# Patient Record
Sex: Female | Born: 1950
Health system: Southern US, Community
[De-identification: ages and names within clinical notes are randomized; demographics above are authoritative.]

## PROBLEM LIST (undated history)

## (undated) ENCOUNTER — Emergency Department (HOSPITAL_COMMUNITY): Admission: EM | Payer: Worker's Compensation | Source: Home / Self Care

## (undated) DIAGNOSIS — K219 Gastro-esophageal reflux disease without esophagitis: Secondary | ICD-10-CM

## (undated) DIAGNOSIS — D72829 Elevated white blood cell count, unspecified: Secondary | ICD-10-CM

## (undated) DIAGNOSIS — I10 Essential (primary) hypertension: Secondary | ICD-10-CM

## (undated) DIAGNOSIS — I447 Left bundle-branch block, unspecified: Secondary | ICD-10-CM

## (undated) DIAGNOSIS — K7581 Nonalcoholic steatohepatitis (NASH): Secondary | ICD-10-CM

## (undated) DIAGNOSIS — D649 Anemia, unspecified: Secondary | ICD-10-CM

## (undated) DIAGNOSIS — M1990 Unspecified osteoarthritis, unspecified site: Secondary | ICD-10-CM

## (undated) DIAGNOSIS — E119 Type 2 diabetes mellitus without complications: Secondary | ICD-10-CM

## (undated) DIAGNOSIS — M722 Plantar fascial fibromatosis: Secondary | ICD-10-CM

## (undated) DIAGNOSIS — Z8489 Family history of other specified conditions: Secondary | ICD-10-CM

## (undated) DIAGNOSIS — N3010 Interstitial cystitis (chronic) without hematuria: Secondary | ICD-10-CM

## (undated) HISTORY — DX: Elevated white blood cell count, unspecified: D72.829

## (undated) HISTORY — PX: SHOULDER SURGERY: SHX246

## (undated) HISTORY — PX: CARPAL TUNNEL RELEASE: SHX101

## (undated) HISTORY — PX: BACK SURGERY: SHX140

## (undated) HISTORY — PX: TENDON REPAIR: SHX5111

---

## 1973-12-25 HISTORY — PX: DILATION AND CURETTAGE OF UTERUS: SHX78

## 1985-12-25 HISTORY — PX: TUBAL LIGATION: SHX77

## 2001-08-19 ENCOUNTER — Ambulatory Visit (HOSPITAL_COMMUNITY): Admission: RE | Admit: 2001-08-19 | Discharge: 2001-08-19 | Payer: Self-pay | Admitting: Orthopedic Surgery

## 2001-08-19 ENCOUNTER — Encounter: Payer: Self-pay | Admitting: Orthopedic Surgery

## 2003-03-25 ENCOUNTER — Other Ambulatory Visit: Admission: RE | Admit: 2003-03-25 | Discharge: 2003-03-25 | Payer: Self-pay | Admitting: Obstetrics & Gynecology

## 2004-04-26 ENCOUNTER — Other Ambulatory Visit: Admission: RE | Admit: 2004-04-26 | Discharge: 2004-04-26 | Payer: Self-pay | Admitting: Obstetrics & Gynecology

## 2004-10-19 ENCOUNTER — Ambulatory Visit (HOSPITAL_COMMUNITY): Admission: RE | Admit: 2004-10-19 | Discharge: 2004-10-19 | Payer: Self-pay | Admitting: Family Medicine

## 2005-06-28 ENCOUNTER — Other Ambulatory Visit: Admission: RE | Admit: 2005-06-28 | Discharge: 2005-06-28 | Payer: Self-pay | Admitting: Obstetrics & Gynecology

## 2005-07-02 ENCOUNTER — Emergency Department (HOSPITAL_COMMUNITY): Admission: EM | Admit: 2005-07-02 | Discharge: 2005-07-02 | Payer: Self-pay | Admitting: Emergency Medicine

## 2006-01-31 ENCOUNTER — Emergency Department (HOSPITAL_COMMUNITY): Admission: EM | Admit: 2006-01-31 | Discharge: 2006-01-31 | Payer: Self-pay | Admitting: Emergency Medicine

## 2009-09-09 ENCOUNTER — Encounter: Admission: RE | Admit: 2009-09-09 | Discharge: 2009-09-09 | Payer: Self-pay | Admitting: Family Medicine

## 2009-09-21 ENCOUNTER — Encounter: Admission: RE | Admit: 2009-09-21 | Discharge: 2009-09-21 | Payer: Self-pay | Admitting: Family Medicine

## 2009-09-25 ENCOUNTER — Encounter: Admission: RE | Admit: 2009-09-25 | Discharge: 2009-09-25 | Payer: Self-pay | Admitting: Family Medicine

## 2010-01-14 ENCOUNTER — Ambulatory Visit (HOSPITAL_COMMUNITY): Admission: RE | Admit: 2010-01-14 | Discharge: 2010-01-14 | Payer: Self-pay | Admitting: Neurological Surgery

## 2010-01-19 ENCOUNTER — Ambulatory Visit: Payer: Self-pay | Admitting: Oncology

## 2010-01-20 ENCOUNTER — Other Ambulatory Visit: Admission: RE | Admit: 2010-01-20 | Discharge: 2010-01-20 | Payer: Self-pay | Admitting: Oncology

## 2010-01-20 LAB — MORPHOLOGY: PLT EST: ADEQUATE

## 2010-01-20 LAB — CBC WITH DIFFERENTIAL/PLATELET
Basophils Absolute: 0.1 10*3/uL (ref 0.0–0.1)
HCT: 41.8 % (ref 34.8–46.6)
HGB: 14.1 g/dL (ref 11.6–15.9)
MCH: 29.2 pg (ref 25.1–34.0)
MONO#: 1 10*3/uL — ABNORMAL HIGH (ref 0.1–0.9)
NEUT%: 67.2 % (ref 38.4–76.8)
WBC: 13.4 10*3/uL — ABNORMAL HIGH (ref 3.9–10.3)
lymph#: 3.1 10*3/uL (ref 0.9–3.3)

## 2010-01-20 LAB — COMPREHENSIVE METABOLIC PANEL
ALT: 25 U/L (ref 0–35)
AST: 26 U/L (ref 0–37)
Albumin: 3.9 g/dL (ref 3.5–5.2)
Alkaline Phosphatase: 84 U/L (ref 39–117)
Potassium: 3.6 mEq/L (ref 3.5–5.3)
Sodium: 140 mEq/L (ref 135–145)
Total Protein: 7.7 g/dL (ref 6.0–8.3)

## 2010-01-20 LAB — CHCC SMEAR

## 2010-01-25 LAB — FLOW CYTOMETRY

## 2010-04-11 ENCOUNTER — Ambulatory Visit: Payer: Self-pay | Admitting: Oncology

## 2010-04-13 LAB — CBC WITH DIFFERENTIAL/PLATELET
EOS%: 2.9 % (ref 0.0–7.0)
MCH: 28.8 pg (ref 25.1–34.0)
MCHC: 32.6 g/dL (ref 31.5–36.0)
MCV: 88.1 fL (ref 79.5–101.0)
MONO%: 8.1 % (ref 0.0–14.0)
NEUT#: 5.9 10*3/uL (ref 1.5–6.5)
RBC: 4.97 10*6/uL (ref 3.70–5.45)
RDW: 13.7 % (ref 11.2–14.5)

## 2010-07-19 ENCOUNTER — Ambulatory Visit: Payer: Self-pay | Admitting: Oncology

## 2010-07-22 LAB — CBC WITH DIFFERENTIAL/PLATELET
BASO%: 1.4 % (ref 0.0–2.0)
HCT: 41.5 % (ref 34.8–46.6)
LYMPH%: 26.6 % (ref 14.0–49.7)
MCH: 29.7 pg (ref 25.1–34.0)
MCHC: 34.1 g/dL (ref 31.5–36.0)
MONO#: 0.6 10*3/uL (ref 0.1–0.9)
NEUT%: 64.7 % (ref 38.4–76.8)
Platelets: 317 10*3/uL (ref 145–400)
WBC: 11 10*3/uL — ABNORMAL HIGH (ref 3.9–10.3)

## 2010-07-22 LAB — COMPREHENSIVE METABOLIC PANEL
ALT: 31 U/L (ref 0–35)
AST: 31 U/L (ref 0–37)
Albumin: 4.5 g/dL (ref 3.5–5.2)
BUN: 13 mg/dL (ref 6–23)
Calcium: 9.7 mg/dL (ref 8.4–10.5)
Chloride: 101 mEq/L (ref 96–112)
Potassium: 3.9 mEq/L (ref 3.5–5.3)
Total Protein: 7.6 g/dL (ref 6.0–8.3)

## 2010-07-22 LAB — MORPHOLOGY

## 2010-12-25 HISTORY — PX: SHOULDER SURGERY: SHX246

## 2011-03-13 LAB — DIFFERENTIAL
Basophils Absolute: 0.1 10*3/uL (ref 0.0–0.1)
Lymphocytes Relative: 26 % (ref 12–46)
Monocytes Absolute: 1.1 10*3/uL — ABNORMAL HIGH (ref 0.1–1.0)
Monocytes Relative: 8 % (ref 3–12)
Neutro Abs: 9 10*3/uL — ABNORMAL HIGH (ref 1.7–7.7)
Neutrophils Relative %: 64 % (ref 43–77)

## 2011-03-13 LAB — BASIC METABOLIC PANEL WITH GFR
BUN: 14 mg/dL (ref 6–23)
CO2: 28 meq/L (ref 19–32)
Calcium: 10 mg/dL (ref 8.4–10.5)
Chloride: 100 meq/L (ref 96–112)
Creatinine, Ser: 0.64 mg/dL (ref 0.4–1.2)
GFR calc Af Amer: >60 mL/min (ref >60)
GFR calc non Af Amer: >60 mL/min (ref >60)
Glucose, Bld: 121 mg/dL — ABNORMAL HIGH (ref 70–99)
Potassium: 4.3 meq/L (ref 3.5–5.1)
Sodium: 138 meq/L (ref 135–145)

## 2011-03-13 LAB — PROTIME-INR
INR: 1.01 (ref 0.00–1.49)
Prothrombin Time: 13.2 s (ref 11.6–15.2)

## 2011-03-13 LAB — CBC
Hemoglobin: 14.2 g/dL (ref 12.0–15.0)
RBC: 4.83 MIL/uL (ref 3.87–5.11)
RDW: 13.3 % (ref 11.5–15.5)

## 2011-03-13 LAB — APTT: aPTT: 29 seconds (ref 24–37)

## 2011-05-11 ENCOUNTER — Emergency Department (HOSPITAL_COMMUNITY)
Admission: EM | Admit: 2011-05-11 | Discharge: 2011-05-11 | Disposition: A | Discharge status: Home / Self Care | Payer: Worker's Compensation | Attending: Emergency Medicine | Admitting: Emergency Medicine

## 2011-05-11 ENCOUNTER — Emergency Department (HOSPITAL_COMMUNITY): Payer: Worker's Compensation

## 2011-05-11 DIAGNOSIS — IMO0002 Reserved for concepts with insufficient information to code with codable children: Secondary | ICD-10-CM | POA: Insufficient documentation

## 2011-05-11 DIAGNOSIS — W108XXA Fall (on) (from) other stairs and steps, initial encounter: Secondary | ICD-10-CM | POA: Insufficient documentation

## 2011-05-11 DIAGNOSIS — I10 Essential (primary) hypertension: Secondary | ICD-10-CM | POA: Insufficient documentation

## 2011-05-11 DIAGNOSIS — Z23 Encounter for immunization: Secondary | ICD-10-CM | POA: Insufficient documentation

## 2011-05-11 DIAGNOSIS — Y990 Civilian activity done for income or pay: Secondary | ICD-10-CM | POA: Insufficient documentation

## 2011-05-11 DIAGNOSIS — Y9229 Other specified public building as the place of occurrence of the external cause: Secondary | ICD-10-CM | POA: Insufficient documentation

## 2011-10-27 ENCOUNTER — Other Ambulatory Visit: Payer: Self-pay | Admitting: Oncology

## 2011-10-27 ENCOUNTER — Encounter (HOSPITAL_BASED_OUTPATIENT_CLINIC_OR_DEPARTMENT_OTHER): Payer: Managed Care, Other (non HMO) | Admitting: Oncology

## 2011-10-27 DIAGNOSIS — D72829 Elevated white blood cell count, unspecified: Secondary | ICD-10-CM

## 2011-10-27 LAB — CBC WITH DIFFERENTIAL/PLATELET
HCT: 41.2 % (ref 34.8–46.6)
LYMPH%: 23.1 % (ref 14.0–49.7)
MCH: 29.1 pg (ref 25.1–34.0)
MCHC: 33.5 g/dL (ref 31.5–36.0)
MONO#: 1.1 10*3/uL — ABNORMAL HIGH (ref 0.1–0.9)
MONO%: 6.5 % (ref 0.0–14.0)
NEUT%: 67.5 % (ref 38.4–76.8)
RDW: 13.5 % (ref 11.2–14.5)
WBC: 16.5 10*3/uL — ABNORMAL HIGH (ref 3.9–10.3)
lymph#: 3.8 10*3/uL — ABNORMAL HIGH (ref 0.9–3.3)

## 2011-10-27 LAB — MORPHOLOGY

## 2011-11-01 ENCOUNTER — Telehealth: Payer: Self-pay | Admitting: Oncology

## 2011-11-01 NOTE — Telephone Encounter (Signed)
Faxed over the pof along with the biopsy review form to rad scheduling for the pt's appt

## 2011-11-02 ENCOUNTER — Other Ambulatory Visit: Payer: Self-pay | Admitting: Oncology

## 2011-11-02 DIAGNOSIS — D72829 Elevated white blood cell count, unspecified: Secondary | ICD-10-CM

## 2011-11-13 ENCOUNTER — Encounter (HOSPITAL_COMMUNITY): Payer: Self-pay

## 2011-11-13 ENCOUNTER — Encounter (HOSPITAL_COMMUNITY): Payer: Self-pay | Admitting: *Deleted

## 2011-11-14 ENCOUNTER — Other Ambulatory Visit (HOSPITAL_COMMUNITY): Payer: Self-pay | Admitting: *Deleted

## 2011-11-14 ENCOUNTER — Encounter (HOSPITAL_COMMUNITY): Payer: Self-pay | Admitting: Pharmacy Technician

## 2011-11-14 ENCOUNTER — Other Ambulatory Visit: Payer: Self-pay | Admitting: Radiology

## 2011-11-14 MED ORDER — SODIUM CHLORIDE 0.9 % IV SOLN: INTRAVENOUS | Status: DC

## 2011-11-14 MED ORDER — VANCOMYCIN HCL 1000 MG IV SOLR: 1000.0000 mg | Freq: Once | INTRAVENOUS | Status: DC

## 2011-11-15 ENCOUNTER — Encounter (HOSPITAL_COMMUNITY)
Admission: RE | Admit: 2011-11-15 | Discharge: 2011-11-15 | Disposition: A | Discharge status: Home / Self Care | Payer: Worker's Compensation | Source: Ambulatory Visit | Attending: Oncology | Admitting: Oncology

## 2011-11-15 ENCOUNTER — Other Ambulatory Visit (HOSPITAL_COMMUNITY): Payer: Self-pay | Admitting: *Deleted

## 2011-11-15 ENCOUNTER — Ambulatory Visit (HOSPITAL_COMMUNITY)
Admission: RE | Admit: 2011-11-15 | Discharge: 2011-11-15 | Disposition: A | Discharge status: Home / Self Care | Payer: Managed Care, Other (non HMO) | Source: Ambulatory Visit | Attending: Oncology | Admitting: Oncology

## 2011-11-15 ENCOUNTER — Encounter (HOSPITAL_COMMUNITY): Payer: Self-pay

## 2011-11-15 ENCOUNTER — Other Ambulatory Visit: Payer: Self-pay | Admitting: Interventional Radiology

## 2011-11-15 DIAGNOSIS — D72829 Elevated white blood cell count, unspecified: Secondary | ICD-10-CM | POA: Insufficient documentation

## 2011-11-15 DIAGNOSIS — K219 Gastro-esophageal reflux disease without esophagitis: Secondary | ICD-10-CM | POA: Insufficient documentation

## 2011-11-15 DIAGNOSIS — I10 Essential (primary) hypertension: Secondary | ICD-10-CM | POA: Insufficient documentation

## 2011-11-15 DIAGNOSIS — Z7982 Long term (current) use of aspirin: Secondary | ICD-10-CM | POA: Insufficient documentation

## 2011-11-15 DIAGNOSIS — Z79899 Other long term (current) drug therapy: Secondary | ICD-10-CM | POA: Insufficient documentation

## 2011-11-15 HISTORY — DX: Essential (primary) hypertension: I10

## 2011-11-15 HISTORY — DX: Gastro-esophageal reflux disease without esophagitis: K21.9

## 2011-11-15 HISTORY — DX: Unspecified osteoarthritis, unspecified site: M19.90

## 2011-11-15 HISTORY — DX: Plantar fascial fibromatosis: M72.2

## 2011-11-15 HISTORY — DX: Interstitial cystitis (chronic) without hematuria: N30.10

## 2011-11-15 LAB — CBC
HCT: 39.5 % (ref 36.0–46.0)
MCH: 28.9 pg (ref 26.0–34.0)
MCV: 86.4 fL (ref 78.0–100.0)
RDW: 13.3 % (ref 11.5–15.5)
WBC: 13.4 10*3/uL — ABNORMAL HIGH (ref 4.0–10.5)

## 2011-11-15 MED ORDER — FENTANYL CITRATE 0.05 MG/ML IJ SOLN
INTRAMUSCULAR | Status: AC | PRN
  Administered 2011-11-15 (×2): 50 ug via INTRAVENOUS

## 2011-11-15 MED ORDER — MIDAZOLAM HCL 5 MG/5ML IJ SOLN
INTRAMUSCULAR | Status: AC | PRN
  Administered 2011-11-15 (×2): 1 mg via INTRAVENOUS

## 2011-11-15 NOTE — ED Notes (Signed)
Incision made

## 2011-11-15 NOTE — Procedures (Signed)
Successful R iliac BM asp/Bx  No comp

## 2011-11-15 NOTE — H&P (Signed)
Diane Mayo is an 60 y.o. female.   Chief Complaint: leukocytosis HPI: Bone Marrow Biopsy scheduled for today  Past Medical History  Diagnosis Date  . Leukocytosis   . Hypertension   . Osteoarthritis   . Genital herpes   . GERD (gastroesophageal reflux disease)   . Plantar fasciitis   . Interstitial cystitis     Past Surgical History  Procedure Date  . Carpal tunnel release     right  . Cesarean section   . Tendon repair     site not given    History reviewed. No pertinent family history. Social History:  reports that she has never smoked. She does not have any smokeless tobacco history on file. She reports that she does not drink alcohol or use illicit drugs.  Allergies:  Allergies  Allergen Reactions  . Miconazole     Per dr Gaylyn Rong record/ reaction unknown/ check c pt  . Penicillins     Taken from dr ha record/  Reaction unknown/check with patient    Medications Prior to Admission  Medication Sig Dispense Refill  . losartan-hydrochlorothiazide (HYZAAR) 100-25 MG per tablet Take 1 tablet by mouth every morning.        Marland Kitchen omeprazole (PRILOSEC) 20 MG capsule Take 20 mg by mouth daily.       Marland Kitchen aspirin 325 MG EC tablet Take 325 mg by mouth every 4 (four) hours as needed. PAIN        . Calcium Carbonate-Vitamin D (CALCIUM + D PO) Take 1 tablet by mouth daily.        . naproxen sodium (ANAPROX) 220 MG tablet Take 440 mg by mouth 2 (two) times daily as needed. PAIN        . pentosan polysulfate (ELMIRON) 100 MG capsule Take 200 mg by mouth 2 (two) times daily.         Medications Prior to Admission  Medication Dose Route Frequency Provider Last Rate Last Dose  . DISCONTD: 0.9 %  sodium chloride infusion   Intravenous Continuous Robet Leu, PA      . DISCONTD: vancomycin (VANCOCIN) 1,000 mg in sodium chloride 0.9 % 500 mL IVPB  1,000 mg Intravenous Once Robet Leu, PA        No results found for this or any previous visit (from the past 48 hour(s)). No results  found.  Review of Systems  Constitutional: Negative for fever.  Eyes: Negative for blurred vision and double vision.  Respiratory: Negative for cough.   Cardiovascular: Negative for chest pain.  Gastrointestinal: Negative for nausea, vomiting, abdominal pain and diarrhea.  Neurological: Negative for dizziness and headaches.    Blood pressure 144/62, pulse 74, temperature 98 F (36.7 C), temperature source Oral, resp. rate 16, height 5\' 1"  (1.549 m), weight 250 lb (113.399 kg), SpO2 98.00%. Physical Exam  Constitutional: She is oriented to person, place, and time. She appears well-developed and well-nourished.  HENT:  Head: Normocephalic and atraumatic.  Eyes: EOM are normal.  Neck: Normal range of motion.  Cardiovascular: Normal rate, regular rhythm and normal heart sounds.   No murmur heard. Respiratory: Effort normal and breath sounds normal. She has no wheezes.  GI: Soft. Bowel sounds are normal. She exhibits no mass. There is no tenderness.  Musculoskeletal: Normal range of motion.  Neurological: She is alert and oriented to person, place, and time.  Skin: Skin is warm and dry.     Assessment/Plan Pt scheduled for bone marrow biopsy today in  IR. Understands procedure benefits and risks and agreeable to proceed. Consent signed.   Lesslie Mckeehan A 11/15/2011, 8:07 AM

## 2012-08-07 DIAGNOSIS — N3010 Interstitial cystitis (chronic) without hematuria: Secondary | ICD-10-CM | POA: Insufficient documentation

## 2012-12-12 ENCOUNTER — Telehealth: Payer: Self-pay | Admitting: *Deleted

## 2012-12-12 NOTE — Telephone Encounter (Signed)
Pt left VM has some information for Dr. Gaylyn Rong regarding her father's medical history.

## 2012-12-13 NOTE — Telephone Encounter (Signed)
Pt states saw Dr. Gaylyn Rong about 2 yrs ago with elevated Hgb.  Says they could not find cause of this even after a Bone Marrow biopsy.  Recently found out her father is a carrier for Hemochromatosis. Wanted to know if this could be the cause of her elevated hemoglobin?  States she sees Dr. Renato Gails for her PCP.   Instructed pt to notify Dr. Renato Gails and I will let Dr. Gaylyn Rong know.  Will call her back if he has anything to tell her.  She verbalized understanding.

## 2013-01-16 ENCOUNTER — Other Ambulatory Visit: Payer: Self-pay | Admitting: Dermatology

## 2013-06-02 ENCOUNTER — Other Ambulatory Visit: Payer: Self-pay | Admitting: Obstetrics & Gynecology

## 2013-08-13 DIAGNOSIS — R350 Frequency of micturition: Secondary | ICD-10-CM | POA: Insufficient documentation

## 2014-03-06 ENCOUNTER — Other Ambulatory Visit: Payer: Self-pay | Admitting: Dermatology

## 2014-11-11 DIAGNOSIS — R300 Dysuria: Secondary | ICD-10-CM | POA: Insufficient documentation

## 2014-12-10 DIAGNOSIS — N3001 Acute cystitis with hematuria: Secondary | ICD-10-CM | POA: Insufficient documentation

## 2015-08-17 ENCOUNTER — Other Ambulatory Visit: Payer: Self-pay | Admitting: Obstetrics & Gynecology

## 2015-08-18 LAB — CYTOLOGY - PAP

## 2017-11-01 DIAGNOSIS — N3010 Interstitial cystitis (chronic) without hematuria: Secondary | ICD-10-CM | POA: Diagnosis not present

## 2017-12-07 DIAGNOSIS — E2839 Other primary ovarian failure: Secondary | ICD-10-CM | POA: Diagnosis not present

## 2017-12-07 DIAGNOSIS — R9431 Abnormal electrocardiogram [ECG] [EKG]: Secondary | ICD-10-CM | POA: Diagnosis not present

## 2017-12-07 DIAGNOSIS — M545 Low back pain: Secondary | ICD-10-CM | POA: Diagnosis not present

## 2017-12-07 DIAGNOSIS — I10 Essential (primary) hypertension: Secondary | ICD-10-CM | POA: Diagnosis not present

## 2017-12-07 DIAGNOSIS — K219 Gastro-esophageal reflux disease without esophagitis: Secondary | ICD-10-CM | POA: Diagnosis not present

## 2017-12-07 DIAGNOSIS — E119 Type 2 diabetes mellitus without complications: Secondary | ICD-10-CM | POA: Diagnosis not present

## 2017-12-07 DIAGNOSIS — Z0000 Encounter for general adult medical examination without abnormal findings: Secondary | ICD-10-CM | POA: Diagnosis not present

## 2017-12-07 DIAGNOSIS — E782 Mixed hyperlipidemia: Secondary | ICD-10-CM | POA: Diagnosis not present

## 2017-12-07 DIAGNOSIS — Z23 Encounter for immunization: Secondary | ICD-10-CM | POA: Diagnosis not present

## 2017-12-07 DIAGNOSIS — Z1159 Encounter for screening for other viral diseases: Secondary | ICD-10-CM | POA: Diagnosis not present

## 2017-12-07 DIAGNOSIS — N3010 Interstitial cystitis (chronic) without hematuria: Secondary | ICD-10-CM | POA: Diagnosis not present

## 2017-12-11 ENCOUNTER — Ambulatory Visit
Admission: RE | Admit: 2017-12-11 | Discharge: 2017-12-11 | Disposition: A | Discharge status: Home / Self Care | Payer: Managed Care, Other (non HMO) | Source: Ambulatory Visit | Attending: Family Medicine | Admitting: Family Medicine

## 2017-12-11 ENCOUNTER — Other Ambulatory Visit: Payer: Self-pay | Admitting: Family Medicine

## 2017-12-11 DIAGNOSIS — M48061 Spinal stenosis, lumbar region without neurogenic claudication: Secondary | ICD-10-CM | POA: Diagnosis not present

## 2017-12-11 DIAGNOSIS — M5136 Other intervertebral disc degeneration, lumbar region: Secondary | ICD-10-CM | POA: Diagnosis not present

## 2017-12-11 DIAGNOSIS — R52 Pain, unspecified: Secondary | ICD-10-CM

## 2017-12-20 ENCOUNTER — Encounter: Payer: Self-pay | Admitting: *Deleted

## 2018-01-10 ENCOUNTER — Encounter (INDEPENDENT_AMBULATORY_CARE_PROVIDER_SITE_OTHER): Payer: Self-pay

## 2018-01-10 ENCOUNTER — Ambulatory Visit (INDEPENDENT_AMBULATORY_CARE_PROVIDER_SITE_OTHER): Payer: Medicare Other | Admitting: Interventional Cardiology

## 2018-01-10 ENCOUNTER — Encounter: Payer: Self-pay | Admitting: Interventional Cardiology

## 2018-01-10 VITALS — BP 142/86 | HR 67 | Ht 61.0 in | Wt 231.0 lb

## 2018-01-10 DIAGNOSIS — R9431 Abnormal electrocardiogram [ECG] [EKG]: Secondary | ICD-10-CM | POA: Diagnosis not present

## 2018-01-10 DIAGNOSIS — I10 Essential (primary) hypertension: Secondary | ICD-10-CM

## 2018-01-10 DIAGNOSIS — I447 Left bundle-branch block, unspecified: Secondary | ICD-10-CM | POA: Diagnosis not present

## 2018-01-10 NOTE — Progress Notes (Signed)
Cardiology Office Note   Date:  01/10/2018   ID:  Diane Mayo, DOB 08-Jul-1951, MRN [id number]  PCP:  Maury Dus, MD    No chief complaint on file. HTN, abnormal ECG   Wt Readings from Last 3 Encounters:  01/10/18 231 lb (104.8 kg)  11/15/11 250 lb (113.4 kg)       History of Present Illness: Diane Mayo is a 67 y.o. female who is being seen today for the evaluation of HTN at the request of Lois Huxley, [place].  She has had difficult to control BP.  She gets readings at home in the 140/85 range.    She does not exercise because of her back and leg pain.  She has not tried a recumbent bike or water aerobics.  Her father had CAD.  He had stents placed at age 26.    Denies : Chest pain. Dizziness.  Nitroglycerin use. Orthopnea. Palpitations. Paroxysmal nocturnal dyspnea. Shortness of breath. Syncope.     Past Medical History:  Diagnosis Date  . Genital herpes   . GERD (gastroesophageal reflux disease)   . Hypertension   . Interstitial cystitis   . Leukocytosis   . Osteoarthritis   . Plantar fasciitis     Past Surgical History:  Procedure Laterality Date  . CARPAL TUNNEL RELEASE Bilateral   . CESAREAN SECTION     twice  . SHOULDER SURGERY    . TENDON REPAIR     site not given  . TUBAL LIGATION  1987     Current Outpatient Medications  Medication Sig Dispense Refill  . amLODipine (NORVASC) 10 MG tablet Take 10 mg by mouth daily.    Marland Kitchen aspirin EC 81 MG tablet Take 81 mg by mouth daily.    . Calcium Carbonate-Vitamin D (CALCIUM + D PO) Take 1 tablet by mouth daily.      . cetirizine (ZYRTEC) 10 MG tablet Take 10 mg by mouth daily.    . meloxicam (MOBIC) 7.5 MG tablet Take 7.5 mg by mouth daily.    . metFORMIN (GLUCOPHAGE) 1000 MG tablet Take 1,000 mg by mouth 2 (two) times daily with a meal.    . Metoprolol Tartrate 75 MG TABS Take 1 tablet by mouth 2 (two) times daily.    . naproxen sodium (ANAPROX) 220 MG tablet Take 440 mg by mouth 2 (two) times  daily as needed. PAIN      . olmesartan (BENICAR) 40 MG tablet Take 40 mg by mouth daily.    Marland Kitchen omeprazole (PRILOSEC) 40 MG capsule Take 40 mg by mouth daily.     . pentosan polysulfate (ELMIRON) 100 MG capsule Take 200 mg by mouth 2 (two) times daily.      . phenazopyridine (PYRIDIUM) 95 MG tablet Take 95 mg by mouth as needed for pain.    . pravastatin (PRAVACHOL) 40 MG tablet Take 40 mg by mouth daily.    . sitaGLIPtin (JANUVIA) 100 MG tablet Take 100 mg by mouth daily.     No current facility-administered medications for this visit.     Allergies:   Lisinopril; Miconazole; and Penicillins    Social History:  The patient  reports that  has never smoked. she has never used smokeless tobacco. She reports that she does not drink alcohol or use drugs.   Family History:  The patient's family history includes Alcoholism in her brother; Asthma in her brother; CAD in her father; CVA in her father; Diabetes in her mother;  Diabetes type II in her maternal grandfather; Gout in her father; Heart attack in her father; Hypertension in her father and mother; Kidney failure in her father; Other in her brother and mother; Pneumonia in her mother; Skin cancer in her father.    ROS:  Please see the history of present illness.   Otherwise, review of systems are positive for back pain, leg pain.   All other systems are reviewed and negative.    PHYSICAL EXAM: VS:  BP (!) 142/86 (BP Location: Right Arm, Patient Position: Sitting, Cuff Size: Large)   Pulse 67   Ht 5\' 1"  (1.549 m)   Wt 231 lb (104.8 kg)   SpO2 (!) 67%   BMI 43.65 kg/m  , BMI Body mass index is 43.65 kg/m. GEN: Well nourished, well developed, in no acute distress  HEENT: normal  Neck: no JVD, carotid bruits, or masses Cardiac: RRR; no murmurs, rubs, or gallops,no edema  Respiratory:  clear to auscultation bilaterally, normal work of breathing GI: soft, nontender, nondistended, + BS MS: no deformity or atrophy  Skin: warm and dry, no  rash Neuro:  Strength and sensation are intact Psych: euthymic mood, full affect   EKG:   The ekg ordered today demonstrates NSR, LBBB   Recent Labs: No results found for requested labs within last 8760 hours.   Lipid Panel No results found for: CHOL, TRIG, HDL, CHOLHDL, VLDL, LDLCALC, LDLDIRECT   Other studies Reviewed: Additional studies/ records that were reviewed today with results demonstrating: NSR, LBBB on [facility].   ASSESSMENT AND PLAN:  1. Hypertension: Blood pressure has been in the 761-950 range systolic over 93-26.  Continue current medications.  She ideally would like to stop medications.  Her blood pressure is not low enough to stop any medicines.  We talked about lifestyle changes.  She eats out a lot, particularly at a [employer].  I counseled her about decreasing salt intake.  We spoke about the DASH diet.  She would benefit from weight loss.  We spoke about trying to do exercises that would not affect her back.  She can also try water aerobics. 2. Left bundle branch block: No symptoms of ischemia.  This is her baseline it appears.  No further workup needed at this time.   3. Obesity: Weight loss of 10-15 pounds may result in some blood pressure reduction.  This may help her get off some of the blood pressure medications. 4. Lifestyle changes would be the key way that she could decrease her need for blood pressure medicines.   Current medicines are reviewed at length with the patient today.  The patient concerns regarding her medicines were addressed.  The following changes have been made:  No change  Labs/ tests ordered today include:   Orders Placed This Encounter  Procedures  . EKG 12-Lead    Recommend 150 minutes/week of aerobic exercise Low fat, low carb, high fiber diet recommended  Disposition:   FU in as needed   Signed, Larae Grooms, MD  01/10/2018 3:33 PM    [facility] [address], [place], [place]   [address] Phone: [phone]; Fax: [phone]

## 2018-01-10 NOTE — Patient Instructions (Signed)
Medication Instructions:  Your physician recommends that you continue on your current medications as directed. Please refer to the Current Medication list given to you today.   Labwork: None ordered  Testing/Procedures: None ordered  Follow-Up: Your physician wants you to follow-up AS NEEDED  Any Other Special Instructions Will Be Listed Below (If Applicable).     If you need a refill on your cardiac medications before your next appointment, please call your pharmacy.   

## 2018-01-29 DIAGNOSIS — Z1211 Encounter for screening for malignant neoplasm of colon: Secondary | ICD-10-CM | POA: Diagnosis not present

## 2018-01-29 DIAGNOSIS — K648 Other hemorrhoids: Secondary | ICD-10-CM | POA: Diagnosis not present

## 2018-02-08 ENCOUNTER — Ambulatory Visit (INDEPENDENT_AMBULATORY_CARE_PROVIDER_SITE_OTHER): Payer: Medicare Other

## 2018-02-08 ENCOUNTER — Ambulatory Visit (INDEPENDENT_AMBULATORY_CARE_PROVIDER_SITE_OTHER): Payer: Medicare Other | Admitting: Podiatry

## 2018-02-08 ENCOUNTER — Other Ambulatory Visit: Payer: Self-pay | Admitting: Podiatry

## 2018-02-08 ENCOUNTER — Encounter: Payer: Self-pay | Admitting: Podiatry

## 2018-02-08 VITALS — BP 156/73 | HR 67

## 2018-02-08 DIAGNOSIS — M7741 Metatarsalgia, right foot: Secondary | ICD-10-CM

## 2018-02-08 DIAGNOSIS — R52 Pain, unspecified: Secondary | ICD-10-CM | POA: Diagnosis not present

## 2018-02-08 MED ORDER — MELOXICAM 7.5 MG PO TABS: 7.5000 mg | ORAL_TABLET | Freq: Every day | ORAL | 0 refills | Status: DC

## 2018-02-08 NOTE — Progress Notes (Signed)
   Subjective:    Patient ID: Diane Mayo, female    DOB: 07-08-1951, 67 y.o.   MRN: [id number]  HPI this patient presents the office with chief complaint of a painful, swollen right foot.  She says she woke up 2 weeks ago with severe pain and swelling and she was unable to place her right foot onto the floor.  She has been icing and elevating her right foot and taking Aleve for pain control.  She presents the office today stating that her foot is much better but there is still 3 out of 10 pain and swelling to her right foot.  This patient is a diabetic with no history of trauma or injury to the foot.  She does relate that she increased exercise and stretching in her right foot.  She presents the office today for an evaluation and treatment of her painful right foot.    Review of Systems     Objective:   Physical Exam General Appearance  Alert, conversant and in no acute stress.  Vascular  Dorsalis pedis and posterior pulses are palpable  bilaterally.  Capillary return is within normal limits  bilaterally. Temperature is within normal limits  Bilaterally.  Neurologic  Senn-Weinstein monofilament wire test within normal limits  bilaterally. Muscle power within normal limits bilaterally.  Nails Normal nails noted with no evidence of bacterial or fungal infection.    Orthopedic  No limitations of motion of motion feet bilaterally.  No crepitus or effusions noted.  Generalized swelling noted on the dorsum of the right foot.  Palpable pain noted to the second and third metatarsal shafts of the right foot.  Patient has pain to the first metatarsal and pain upon range of motion of the first MPJ.  Skin  normotropic skin with no porokeratosis noted bilaterally.  No signs of infections or ulcers noted.          Assessment & Plan:  Metatarsalgia right foot. Stress reaction 2,3 metatarsals right foot.   IE  Xray reveals no bony pathology noted to the first, second or third metatarsals of the  right foot.  There is a bony exostosis noted at the insertion of the Achilles tendon right foot.  Examination of the foot reveals pain through the shafts of the second and third metatarsals, especially indicators of possible early stress fracture/stress reaction.  Since her right foot has improved in the last 2 weeks, I decided to treat her with a compression sock for stability and the removal of the swelling from the dorsum of the right foot.  She was instructed to wear her SAS shoes which have thick soled .  Prescribed Mobic 7.5 for this patient to be taken by mouth.  She is to return to the office in 2 weeks at which time we will reevaluate her condition.   Gardiner Barefoot DPM

## 2018-02-11 DIAGNOSIS — E2839 Other primary ovarian failure: Secondary | ICD-10-CM | POA: Diagnosis not present

## 2018-02-22 ENCOUNTER — Ambulatory Visit: Payer: Medicare Other | Admitting: Podiatry

## 2018-03-13 DIAGNOSIS — E119 Type 2 diabetes mellitus without complications: Secondary | ICD-10-CM | POA: Diagnosis not present

## 2018-03-13 DIAGNOSIS — D72829 Elevated white blood cell count, unspecified: Secondary | ICD-10-CM | POA: Diagnosis not present

## 2018-03-13 DIAGNOSIS — M545 Low back pain: Secondary | ICD-10-CM | POA: Diagnosis not present

## 2018-03-13 DIAGNOSIS — E782 Mixed hyperlipidemia: Secondary | ICD-10-CM | POA: Diagnosis not present

## 2018-03-13 DIAGNOSIS — N3010 Interstitial cystitis (chronic) without hematuria: Secondary | ICD-10-CM | POA: Diagnosis not present

## 2018-03-13 DIAGNOSIS — I10 Essential (primary) hypertension: Secondary | ICD-10-CM | POA: Diagnosis not present

## 2018-03-13 DIAGNOSIS — Z7984 Long term (current) use of oral hypoglycemic drugs: Secondary | ICD-10-CM | POA: Diagnosis not present

## 2018-03-13 DIAGNOSIS — K219 Gastro-esophageal reflux disease without esophagitis: Secondary | ICD-10-CM | POA: Diagnosis not present

## 2018-03-13 DIAGNOSIS — Z23 Encounter for immunization: Secondary | ICD-10-CM | POA: Diagnosis not present

## 2018-03-19 DIAGNOSIS — M545 Low back pain: Secondary | ICD-10-CM | POA: Diagnosis not present

## 2018-03-19 DIAGNOSIS — M5416 Radiculopathy, lumbar region: Secondary | ICD-10-CM | POA: Diagnosis not present

## 2018-03-21 DIAGNOSIS — M5416 Radiculopathy, lumbar region: Secondary | ICD-10-CM | POA: Diagnosis not present

## 2018-03-21 DIAGNOSIS — M545 Low back pain: Secondary | ICD-10-CM | POA: Diagnosis not present

## 2018-04-02 DIAGNOSIS — M545 Low back pain: Secondary | ICD-10-CM | POA: Diagnosis not present

## 2018-04-02 DIAGNOSIS — M5416 Radiculopathy, lumbar region: Secondary | ICD-10-CM | POA: Diagnosis not present

## 2018-04-04 DIAGNOSIS — M545 Low back pain: Secondary | ICD-10-CM | POA: Diagnosis not present

## 2018-04-04 DIAGNOSIS — M5416 Radiculopathy, lumbar region: Secondary | ICD-10-CM | POA: Diagnosis not present

## 2018-04-08 DIAGNOSIS — M5416 Radiculopathy, lumbar region: Secondary | ICD-10-CM | POA: Diagnosis not present

## 2018-04-08 DIAGNOSIS — M545 Low back pain: Secondary | ICD-10-CM | POA: Diagnosis not present

## 2018-04-08 DIAGNOSIS — W57XXXA Bitten or stung by nonvenomous insect and other nonvenomous arthropods, initial encounter: Secondary | ICD-10-CM | POA: Diagnosis not present

## 2018-04-08 DIAGNOSIS — S70361A Insect bite (nonvenomous), right thigh, initial encounter: Secondary | ICD-10-CM | POA: Diagnosis not present

## 2018-04-11 DIAGNOSIS — M5416 Radiculopathy, lumbar region: Secondary | ICD-10-CM | POA: Diagnosis not present

## 2018-04-11 DIAGNOSIS — M545 Low back pain: Secondary | ICD-10-CM | POA: Diagnosis not present

## 2018-04-15 DIAGNOSIS — M5416 Radiculopathy, lumbar region: Secondary | ICD-10-CM | POA: Diagnosis not present

## 2018-04-15 DIAGNOSIS — M545 Low back pain: Secondary | ICD-10-CM | POA: Diagnosis not present

## 2018-04-22 DIAGNOSIS — M5416 Radiculopathy, lumbar region: Secondary | ICD-10-CM | POA: Diagnosis not present

## 2018-04-22 DIAGNOSIS — M545 Low back pain: Secondary | ICD-10-CM | POA: Diagnosis not present

## 2018-04-24 DIAGNOSIS — M545 Low back pain: Secondary | ICD-10-CM | POA: Diagnosis not present

## 2018-04-24 DIAGNOSIS — M5416 Radiculopathy, lumbar region: Secondary | ICD-10-CM | POA: Diagnosis not present

## 2018-04-29 DIAGNOSIS — M545 Low back pain: Secondary | ICD-10-CM | POA: Diagnosis not present

## 2018-04-29 DIAGNOSIS — M5416 Radiculopathy, lumbar region: Secondary | ICD-10-CM | POA: Diagnosis not present

## 2018-05-01 DIAGNOSIS — M545 Low back pain: Secondary | ICD-10-CM | POA: Diagnosis not present

## 2018-05-01 DIAGNOSIS — M5416 Radiculopathy, lumbar region: Secondary | ICD-10-CM | POA: Diagnosis not present

## 2018-05-14 DIAGNOSIS — M5416 Radiculopathy, lumbar region: Secondary | ICD-10-CM | POA: Diagnosis not present

## 2018-05-14 DIAGNOSIS — M545 Low back pain: Secondary | ICD-10-CM | POA: Diagnosis not present

## 2018-05-21 DIAGNOSIS — M545 Low back pain: Secondary | ICD-10-CM | POA: Diagnosis not present

## 2018-05-21 DIAGNOSIS — M5416 Radiculopathy, lumbar region: Secondary | ICD-10-CM | POA: Diagnosis not present

## 2018-06-03 DIAGNOSIS — M5416 Radiculopathy, lumbar region: Secondary | ICD-10-CM | POA: Diagnosis not present

## 2018-06-03 DIAGNOSIS — M545 Low back pain: Secondary | ICD-10-CM | POA: Diagnosis not present

## 2018-06-13 DIAGNOSIS — N3010 Interstitial cystitis (chronic) without hematuria: Secondary | ICD-10-CM | POA: Diagnosis not present

## 2018-06-13 DIAGNOSIS — Z7984 Long term (current) use of oral hypoglycemic drugs: Secondary | ICD-10-CM | POA: Diagnosis not present

## 2018-06-13 DIAGNOSIS — Z6841 Body Mass Index (BMI) 40.0 and over, adult: Secondary | ICD-10-CM | POA: Diagnosis not present

## 2018-06-13 DIAGNOSIS — K219 Gastro-esophageal reflux disease without esophagitis: Secondary | ICD-10-CM | POA: Diagnosis not present

## 2018-06-13 DIAGNOSIS — M545 Low back pain: Secondary | ICD-10-CM | POA: Diagnosis not present

## 2018-06-13 DIAGNOSIS — I10 Essential (primary) hypertension: Secondary | ICD-10-CM | POA: Diagnosis not present

## 2018-06-13 DIAGNOSIS — D72829 Elevated white blood cell count, unspecified: Secondary | ICD-10-CM | POA: Diagnosis not present

## 2018-06-13 DIAGNOSIS — E119 Type 2 diabetes mellitus without complications: Secondary | ICD-10-CM | POA: Diagnosis not present

## 2018-06-13 DIAGNOSIS — E782 Mixed hyperlipidemia: Secondary | ICD-10-CM | POA: Diagnosis not present

## 2018-06-13 DIAGNOSIS — E1169 Type 2 diabetes mellitus with other specified complication: Secondary | ICD-10-CM | POA: Diagnosis not present

## 2018-06-13 DIAGNOSIS — Z23 Encounter for immunization: Secondary | ICD-10-CM | POA: Diagnosis not present

## 2018-06-24 DIAGNOSIS — E119 Type 2 diabetes mellitus without complications: Secondary | ICD-10-CM | POA: Diagnosis not present

## 2018-06-24 DIAGNOSIS — B370 Candidal stomatitis: Secondary | ICD-10-CM | POA: Diagnosis not present

## 2018-06-24 DIAGNOSIS — I10 Essential (primary) hypertension: Secondary | ICD-10-CM | POA: Diagnosis not present

## 2018-10-08 DIAGNOSIS — Z01419 Encounter for gynecological examination (general) (routine) without abnormal findings: Secondary | ICD-10-CM | POA: Diagnosis not present

## 2018-10-08 DIAGNOSIS — Z6841 Body Mass Index (BMI) 40.0 and over, adult: Secondary | ICD-10-CM | POA: Diagnosis not present

## 2018-10-08 DIAGNOSIS — Z1231 Encounter for screening mammogram for malignant neoplasm of breast: Secondary | ICD-10-CM | POA: Diagnosis not present

## 2018-10-15 DIAGNOSIS — E119 Type 2 diabetes mellitus without complications: Secondary | ICD-10-CM | POA: Diagnosis not present

## 2018-10-15 DIAGNOSIS — H2513 Age-related nuclear cataract, bilateral: Secondary | ICD-10-CM | POA: Diagnosis not present

## 2018-11-05 DIAGNOSIS — N3010 Interstitial cystitis (chronic) without hematuria: Secondary | ICD-10-CM | POA: Diagnosis not present

## 2018-12-11 DIAGNOSIS — Z1389 Encounter for screening for other disorder: Secondary | ICD-10-CM | POA: Diagnosis not present

## 2018-12-11 DIAGNOSIS — Z0000 Encounter for general adult medical examination without abnormal findings: Secondary | ICD-10-CM | POA: Diagnosis not present

## 2018-12-11 DIAGNOSIS — I10 Essential (primary) hypertension: Secondary | ICD-10-CM | POA: Diagnosis not present

## 2018-12-11 DIAGNOSIS — K219 Gastro-esophageal reflux disease without esophagitis: Secondary | ICD-10-CM | POA: Diagnosis not present

## 2018-12-11 DIAGNOSIS — Z6841 Body Mass Index (BMI) 40.0 and over, adult: Secondary | ICD-10-CM | POA: Diagnosis not present

## 2018-12-11 DIAGNOSIS — D72829 Elevated white blood cell count, unspecified: Secondary | ICD-10-CM | POA: Diagnosis not present

## 2018-12-11 DIAGNOSIS — Z23 Encounter for immunization: Secondary | ICD-10-CM | POA: Diagnosis not present

## 2018-12-11 DIAGNOSIS — E1169 Type 2 diabetes mellitus with other specified complication: Secondary | ICD-10-CM | POA: Diagnosis not present

## 2018-12-11 DIAGNOSIS — M545 Low back pain: Secondary | ICD-10-CM | POA: Diagnosis not present

## 2018-12-11 DIAGNOSIS — N3010 Interstitial cystitis (chronic) without hematuria: Secondary | ICD-10-CM | POA: Diagnosis not present

## 2018-12-11 DIAGNOSIS — E782 Mixed hyperlipidemia: Secondary | ICD-10-CM | POA: Diagnosis not present

## 2019-01-20 DIAGNOSIS — L821 Other seborrheic keratosis: Secondary | ICD-10-CM | POA: Diagnosis not present

## 2019-01-20 DIAGNOSIS — L817 Pigmented purpuric dermatosis: Secondary | ICD-10-CM | POA: Diagnosis not present

## 2019-01-25 ENCOUNTER — Encounter: Payer: Self-pay | Admitting: Podiatry

## 2019-01-25 ENCOUNTER — Ambulatory Visit (INDEPENDENT_AMBULATORY_CARE_PROVIDER_SITE_OTHER): Payer: PPO

## 2019-01-25 ENCOUNTER — Encounter

## 2019-01-25 ENCOUNTER — Ambulatory Visit: Payer: PPO | Admitting: Podiatry

## 2019-01-25 DIAGNOSIS — B3731 Acute candidiasis of vulva and vagina: Secondary | ICD-10-CM | POA: Insufficient documentation

## 2019-01-25 DIAGNOSIS — M1000 Idiopathic gout, unspecified site: Secondary | ICD-10-CM | POA: Diagnosis not present

## 2019-01-25 DIAGNOSIS — B373 Candidiasis of vulva and vagina: Secondary | ICD-10-CM | POA: Insufficient documentation

## 2019-01-25 DIAGNOSIS — M7752 Other enthesopathy of left foot: Secondary | ICD-10-CM | POA: Diagnosis not present

## 2019-01-25 DIAGNOSIS — M779 Enthesopathy, unspecified: Secondary | ICD-10-CM

## 2019-01-25 DIAGNOSIS — M109 Gout, unspecified: Secondary | ICD-10-CM

## 2019-01-25 DIAGNOSIS — R52 Pain, unspecified: Secondary | ICD-10-CM

## 2019-01-25 DIAGNOSIS — E669 Obesity, unspecified: Secondary | ICD-10-CM | POA: Insufficient documentation

## 2019-01-25 DIAGNOSIS — N951 Menopausal and female climacteric states: Secondary | ICD-10-CM | POA: Insufficient documentation

## 2019-01-25 DIAGNOSIS — E119 Type 2 diabetes mellitus without complications: Secondary | ICD-10-CM | POA: Insufficient documentation

## 2019-01-25 MED ORDER — METHYLPREDNISOLONE 4 MG PO TBPK: ORAL_TABLET | ORAL | 0 refills | Status: DC

## 2019-01-25 MED ORDER — TRIAMCINOLONE ACETONIDE 10 MG/ML IJ SUSP
10.0000 mg | Freq: Once | INTRAMUSCULAR | Status: AC
  Administered 2019-01-25: 10 mg

## 2019-01-25 NOTE — Patient Instructions (Signed)

## 2019-01-27 ENCOUNTER — Other Ambulatory Visit: Payer: Self-pay | Admitting: Podiatry

## 2019-01-27 DIAGNOSIS — M109 Gout, unspecified: Secondary | ICD-10-CM

## 2019-02-03 NOTE — Progress Notes (Signed)
Subjective:   Patient ID: Diane Mayo, female   DOB: 68 y.o.   MRN: [id number]   HPI Patient presents with inflammation around the big toe joint left foot stating its been very sore making it hard to wear shoe gear comfortably.  Does not remember specific injury but states it is been bothering her   ROS      Objective:  Physical Exam  Inflammatory changes consistent with probable gout of the left first MP joint     Assessment:  Acute capsulitis with probability for gout symptomatology     Plan:  Sterile prep and then injected the joint 3 mg Kenalog 5 mg Xylocaine advised on reduced activity ice therapy and compression and reappoint to recheck  X-rays were negative for signs of fracture and did indicate moderate soft tissue inflammation

## 2019-02-20 DIAGNOSIS — I10 Essential (primary) hypertension: Secondary | ICD-10-CM | POA: Diagnosis not present

## 2019-02-21 ENCOUNTER — Other Ambulatory Visit: Payer: Self-pay | Admitting: Family Medicine

## 2019-02-25 ENCOUNTER — Other Ambulatory Visit: Payer: Self-pay | Admitting: Family Medicine

## 2019-02-25 ENCOUNTER — Telehealth: Payer: Self-pay | Admitting: Podiatry

## 2019-02-25 DIAGNOSIS — I10 Essential (primary) hypertension: Secondary | ICD-10-CM

## 2019-02-25 NOTE — Telephone Encounter (Signed)
I was seen by Dr. Paulla Dolly on 01 February and I was given a steroid injection and a weeks worth of steroid medication. I was doing good until yesterday when the pain returned. I cannot put weight on my foot now.

## 2019-02-25 NOTE — Telephone Encounter (Signed)
I informed pt that in some cases of tendonitis the inflammation has to be treated in sessions, the 1st being injections and steroids, but if not improving may need additional treatment. I instructed pt to make an appt, and in the interim ice 3-4 times a day 15-20 minutes/session protecting skin with a light cloth from the ice pack, and rest, wear supportive shoes non-bending soles. Transferred pt to schedulers.

## 2019-02-27 ENCOUNTER — Encounter: Payer: Self-pay | Admitting: Podiatry

## 2019-02-27 ENCOUNTER — Ambulatory Visit: Payer: PPO | Admitting: Podiatry

## 2019-02-27 DIAGNOSIS — M779 Enthesopathy, unspecified: Secondary | ICD-10-CM

## 2019-02-27 DIAGNOSIS — M109 Gout, unspecified: Secondary | ICD-10-CM | POA: Diagnosis not present

## 2019-02-27 DIAGNOSIS — M1000 Idiopathic gout, unspecified site: Secondary | ICD-10-CM | POA: Diagnosis not present

## 2019-02-27 MED ORDER — TRIAMCINOLONE ACETONIDE 10 MG/ML IJ SUSP
10.0000 mg | Freq: Once | INTRAMUSCULAR | Status: AC
  Administered 2019-02-27: 10 mg

## 2019-03-01 LAB — ANA, IFA COMPREHENSIVE PANEL
Anti Nuclear Antibody(ANA): NEGATIVE
ENA SM Ab Ser-aCnc: <1 AI
SM/RNP: <1 AI
SSA (Ro) (ENA) Antibody, IgG: <1 AI
SSB (La) (ENA) Antibody, IgG: <1 AI
Scleroderma (Scl-70) (ENA) Antibody, IgG: <1 AI
ds DNA Ab: 8 IU/mL — ABNORMAL HIGH

## 2019-03-01 LAB — RHEUMATOID FACTOR: Rheumatoid fact SerPl-aCnc: <14 IU/mL (ref <14)

## 2019-03-01 LAB — URIC ACID: Uric Acid, Serum: 6.4 mg/dL (ref 2.5–7.0)

## 2019-03-01 LAB — SEDIMENTATION RATE: SED RATE: 22 mm/h (ref 0–30)

## 2019-03-01 LAB — C-REACTIVE PROTEIN: CRP: 62.9 mg/L — ABNORMAL HIGH (ref <8.0)

## 2019-03-02 NOTE — Progress Notes (Signed)
Subjective:   Patient ID: Diane Mayo, female   DOB: 68 y.o.   MRN: [id number]   HPI Patient presents stating she has developed more discomfort in the outside of her left foot and it still feels like something is going on in general with her foot   ROS      Objective:  Physical Exam  Neurovascular status unchanged with patient's left foot lateral side being very tender in the peroneal tendon group and into the peroneal tertius tendon group.  It is localized to this area     Assessment:  Inflammatory tendinitis left that may also be systemic or be related to gout possibly     Plan:  H&P and I discussed with her both different problems and I explained her for some kind of systemic inflammatory condition which may be causing her problems versus localized inflammation.  Due to the intense discomfort she is and I did go ahead I did a sterile prep of the lateral peroneal group and injected the she 3 mg Kenalog 5 mg Xylocaine advised on reduced activity and I am sending for blood work and will review this with her again in 1 week

## 2019-03-03 ENCOUNTER — Encounter: Payer: Self-pay | Admitting: Podiatry

## 2019-03-03 ENCOUNTER — Ambulatory Visit: Payer: PPO | Admitting: Podiatry

## 2019-03-03 DIAGNOSIS — M779 Enthesopathy, unspecified: Secondary | ICD-10-CM | POA: Diagnosis not present

## 2019-03-03 DIAGNOSIS — M109 Gout, unspecified: Secondary | ICD-10-CM

## 2019-03-03 MED ORDER — COLCHICINE 0.6 MG PO TABS: 0.6000 mg | ORAL_TABLET | Freq: Two times a day (BID) | ORAL | 2 refills | Status: DC

## 2019-03-03 MED ORDER — ALLOPURINOL 100 MG PO TABS: 100.0000 mg | ORAL_TABLET | Freq: Every day | ORAL | 6 refills | Status: DC

## 2019-03-05 NOTE — Progress Notes (Signed)
Subjective:   Patient ID: Diane Mayo, female   DOB: 68 y.o.   MRN: [id number]   HPI Patient states that the left seems okay but now the right big toe joint she is concerned that it may be a problem   ROS      Objective:  Physical Exam neurovascular status intact with patient now having some redness around the first MPJ right that is low localized and white at the current time with the left still showing inflammation but improved from previous visit     Assessment:  Opportunity for gout which blood work indicated is a possibility with also high inflammatory marker     Plan:  Reviewed blood work with her and the gout-like possibility.  At this point we are to start on allopurinol and I did give colchicine but I want her to use if she should start to develop increased redness.  I am going to continue to evaluate this and we may eventually refer her to a rheumatologist but I want to see whether or not she will have good improvement with the medications were utilized

## 2019-04-15 ENCOUNTER — Ambulatory Visit: Payer: Self-pay | Admitting: Interventional Cardiology

## 2019-04-17 ENCOUNTER — Other Ambulatory Visit: Payer: Self-pay

## 2019-05-14 ENCOUNTER — Other Ambulatory Visit: Payer: Self-pay

## 2019-05-26 ENCOUNTER — Telehealth: Payer: Self-pay | Admitting: Interventional Cardiology

## 2019-06-02 ENCOUNTER — Ambulatory Visit
Admission: RE | Admit: 2019-06-02 | Discharge: 2019-06-02 | Disposition: A | Discharge status: Home / Self Care | Payer: PPO | Source: Ambulatory Visit | Attending: Family Medicine | Admitting: Family Medicine

## 2019-06-02 DIAGNOSIS — I10 Essential (primary) hypertension: Secondary | ICD-10-CM | POA: Diagnosis not present

## 2019-06-12 DIAGNOSIS — D72829 Elevated white blood cell count, unspecified: Secondary | ICD-10-CM | POA: Diagnosis not present

## 2019-06-12 DIAGNOSIS — M1000 Idiopathic gout, unspecified site: Secondary | ICD-10-CM | POA: Diagnosis not present

## 2019-06-12 DIAGNOSIS — E1169 Type 2 diabetes mellitus with other specified complication: Secondary | ICD-10-CM | POA: Diagnosis not present

## 2019-06-12 DIAGNOSIS — I10 Essential (primary) hypertension: Secondary | ICD-10-CM | POA: Diagnosis not present

## 2019-06-12 DIAGNOSIS — N3010 Interstitial cystitis (chronic) without hematuria: Secondary | ICD-10-CM | POA: Diagnosis not present

## 2019-06-12 DIAGNOSIS — M545 Low back pain: Secondary | ICD-10-CM | POA: Diagnosis not present

## 2019-06-12 DIAGNOSIS — K219 Gastro-esophageal reflux disease without esophagitis: Secondary | ICD-10-CM | POA: Diagnosis not present

## 2019-06-12 DIAGNOSIS — E782 Mixed hyperlipidemia: Secondary | ICD-10-CM | POA: Diagnosis not present

## 2019-06-18 ENCOUNTER — Telehealth: Payer: Self-pay

## 2019-06-18 NOTE — Telephone Encounter (Signed)

## 2019-06-20 ENCOUNTER — Telehealth: Payer: Self-pay | Admitting: Interventional Cardiology

## 2019-06-20 NOTE — Telephone Encounter (Signed)
New Message ° ° ° ° °CONSENT FOR TELE-HEALTH VISIT - PLEASE REVIEW TO PATIENT ° °I hereby voluntarily request, consent and authorize [facility] and its employed or contracted physicians, physician assistants, nurse practitioners or other licensed health care professionals (the Practitioner), to provide me with telemedicine health care services (the "Services") as deemed necessary by the treating Practitioner. I acknowledge and consent to receive the Services by the Practitioner via telemedicine. I understand that the telemedicine visit will involve communicating with the Practitioner through live audiovisual communication technology and the disclosure of certain medical information by electronic transmission. I acknowledge that I have been given the opportunity to request an in-person assessment or other available alternative prior to the telemedicine visit and am voluntarily participating in the telemedicine visit. ° °I understand that I have the right to withhold or withdraw my consent to the use of telemedicine in the course of my care at any time, without affecting my right to future care or treatment, and that the Practitioner or I may terminate the ° °telemedicine visit at any time. I understand that I have the right to inspect all information obtained and/or recorded in the course of the telemedicine visit and may receive copies of available information for a reasonable fee. I understand that some of the potential risks of receiving the Services via telemedicine include: ° °Delay or interruption in medical evaluation due to technological equipment failure or disruption; ° °Information transmitted may not be sufficient (e.g. poor resolution of images) to allow for appropriate medical decision making by the Practitioner; and/or ° °In rare instances, security protocols could fail, causing a breach of personal health information. ° °Furthermore, I acknowledge that it is my responsibility to provide  information about my medical history, conditions and care that is complete and accurate to the best of my ability. I acknowledge that Practitioner's advice, recommendations, and/or decision may be based on factors not within their control, such as incomplete or inaccurate data provided by me or distortions of diagnostic images or specimens that may result from electronic transmissions. I understand that the practice of medicine is not an exact science and that Practitioner makes no warranties or guarantees regarding treatment outcomes. I acknowledge that I will receive a copy of this consent concurrently upon execution via email to the email address I last provided but may also request a printed copy by calling the office of [facility]. ° °I understand that my insurance will be billed for this visit. ° °I have read or had this consent read to me. ° °I understand the contents of this consent, which adequately explains the benefits and risks of the Services being provided via telemedicine. ° °I have been provided ample opportunity to ask questions regarding this consent and the Services and have had my questions answered to my satisfaction. ° °I give my informed consent for the services to be provided through the use of telemedicine in my medical care ° °By participating in this telemedicine visit I agree to the above °YES °

## 2019-06-22 NOTE — Progress Notes (Addendum)
Virtual Visit via Video Note   This visit type was conducted due to national recommendations for restrictions regarding the COVID-19 Pandemic (e.g. social distancing) in an effort to limit this patient's exposure and mitigate transmission in our community.  Due to her co-morbid illnesses, this patient is at least at moderate risk for complications without adequate follow up.  This format is felt to be most appropriate for this patient at this time.  All issues noted in this document were discussed and addressed.  A limited physical exam was performed with this format.  Please refer to the patient's chart for her consent to telehealth for [facility].   Unable to get video connection.  Date:  06/23/2019   ID:  Diane Mayo, DOB [birth year], MRN [id number]  Patient Location: Home Provider Location: Home  PCP:  Maury Dus, MD  Cardiologist:  No primary care provider on file. [place] Electrophysiologist:  None   Evaluation Performed:  Follow-Up Visit  Chief Complaint:  HTN  History of Present Illness:    Diane Mayo is a 68 y.o. female who is being seen today for the evaluation of HTN at the request of Lois Huxley, [place].  She has had difficult to control BP.  She gets readings at home in the 140/85 range.    She does not exercise because of her back and leg pain.  She has not tried a recumbent bike or water aerobics.  Her father had CAD.  He had stents placed at age 77.    The patient does not have symptoms concerning for COVID-19 infection (fever, chills, cough, or new shortness of breath).   BP has been in the 536-144 range systolic.  She did try the valsartan for 3 weeks, but BP did not change.  She had a cough with lisinopril / quinapril in the past.  Denies : Chest pain. Dizziness. Leg edema. Nitroglycerin use. Orthopnea. Palpitations. Paroxysmal nocturnal dyspnea. Shortness of breath. Syncope.      Past Medical History:  Diagnosis Date  . Genital herpes    . GERD (gastroesophageal reflux disease)   . Hypertension   . Interstitial cystitis   . Leukocytosis   . Osteoarthritis   . Plantar fasciitis    Past Surgical History:  Procedure Laterality Date  . CARPAL TUNNEL RELEASE Bilateral   . CESAREAN SECTION     twice  . SHOULDER SURGERY    . TENDON REPAIR     site not given  . TUBAL LIGATION  1987     Current Meds  Medication Sig  . allopurinol (ZYLOPRIM) 100 MG tablet Take 1 tablet (100 mg total) by mouth daily.  Marland Kitchen amLODipine (NORVASC) 10 MG tablet Take 10 mg by mouth daily.  . cetirizine (ZYRTEC) 10 MG tablet Take 10 mg by mouth daily.  . cloNIDine (CATAPRES) 0.1 MG tablet Take 0.1 mg by mouth 2 (two) times daily.  . colchicine 0.6 MG tablet Take 1 tablet (0.6 mg total) by mouth 2 (two) times daily.  . metFORMIN (GLUCOPHAGE) 1000 MG tablet Take 1,000 mg by mouth 2 (two) times daily with a meal.  . Metoprolol Tartrate 75 MG TABS Take 1 tablet by mouth 2 (two) times daily.  . naproxen sodium (ANAPROX) 220 MG tablet Take 440 mg by mouth 2 (two) times daily as needed. PAIN    . omeprazole (PRILOSEC) 40 MG capsule Take 40 mg by mouth daily.   . pentosan polysulfate (ELMIRON) 100 MG capsule Take 200 mg by mouth  2 (two) times daily.    . pravastatin (PRAVACHOL) 40 MG tablet Take 40 mg by mouth daily.     Allergies:   Lisinopril, Miconazole, and Penicillins   Social History   Tobacco Use  . Smoking status: Never Smoker  . Smokeless tobacco: Never Used  Substance Use Topics  . Alcohol use: No  . Drug use: No     Family Hx: The patient's family history includes Alcoholism in her brother; Asthma in her brother; CAD in her father; CVA in her father; Diabetes in her mother; Diabetes type II in her maternal grandfather; Gout in her father; Heart attack in her father; Hypertension in her father and mother; Kidney failure in her father; Other in her brother and mother; Pneumonia in her mother; Skin cancer in her father.  ROS:   Please  see the history of present illness.    Difficult to control BP All other systems reviewed and are negative.   Prior CV studies:   The following studies were reviewed today:    Labs/Other Tests and Data Reviewed:    EKG:  No ECG reviewed.  Recent Labs: No results found for requested labs within last 8760 hours.   Recent Lipid Panel No results found for: CHOL, TRIG, HDL, CHOLHDL, LDLCALC, LDLDIRECT  Wt Readings from Last 3 Encounters:  06/23/19 210 lb (95.3 kg)  01/10/18 231 lb (104.8 kg)  11/15/11 250 lb (113.4 kg)     Objective:    Vital Signs:  BP (!) 141/71   Pulse (!) 57   Ht 5\' 1"  (1.549 m)   Wt 210 lb (95.3 kg)   BMI 39.68 kg/m    VITAL SIGNS:  reviewed RESPIRATORY:  no shortnes of breath Normal affect  ASSESSMENT & PLAN:    1. HTN: No benefit from valsartan.  She does eat a fair mount of salt.  Avoid processed foods. Will start irbesartan.  She cannot exercise due to leg problems.  Will try to wean clonidine if irbesartan works.   2. LBBB: Known form prior.   3. Obesity: Try to lose weight.  She has lost weight since 2019.  COVID-19 Education: The signs and symptoms of COVID-19 were discussed with the patient and how to seek care for testing (follow up with PCP or arrange E-visit).  The importance of social distancing was discussed today.  Time:   Today, I have spent 25 minutes with the patient with telehealth technology discussing the above problems.     Medication Adjustments/Labs and Tests Ordered: Current medicines are reviewed at length with the patient today.  Concerns regarding medicines are outlined above.   Tests Ordered: No orders of the defined types were placed in this encounter.   Medication Changes: No orders of the defined types were placed in this encounter.   Follow Up:  Virtual Visit in 2 week(s)  Signed, Larae Grooms, MD  06/23/2019 11:55 AM    [place]

## 2019-06-23 ENCOUNTER — Telehealth (INDEPENDENT_AMBULATORY_CARE_PROVIDER_SITE_OTHER): Payer: PPO | Admitting: Interventional Cardiology

## 2019-06-23 ENCOUNTER — Other Ambulatory Visit: Payer: Self-pay

## 2019-06-23 ENCOUNTER — Encounter: Payer: Self-pay | Admitting: Interventional Cardiology

## 2019-06-23 VITALS — BP 141/71 | HR 57 | Ht 61.0 in | Wt 210.0 lb

## 2019-06-23 DIAGNOSIS — I10 Essential (primary) hypertension: Secondary | ICD-10-CM | POA: Diagnosis not present

## 2019-06-23 DIAGNOSIS — I447 Left bundle-branch block, unspecified: Secondary | ICD-10-CM

## 2019-06-23 MED ORDER — IRBESARTAN 75 MG PO TABS: 75.0000 mg | ORAL_TABLET | Freq: Every day | ORAL | 6 refills | Status: DC

## 2019-06-23 NOTE — Patient Instructions (Addendum)
Medication Instructions:  Your physician has recommended you make the following change in your medication:   Start Irbesartan 75MG , 1 tablet once a day  If you need a refill on your cardiac medications before your next appointment, please call your pharmacy.   Lab work: Have BMET drawn with primary care doctor (we will fax this order to them).  If you have labs (blood work) drawn today and your tests are completely normal, you will receive your results only by: Marland Kitchen MyChart Message (if you have MyChart) OR . A paper copy in the mail If you have any lab test that is abnormal or we need to change your treatment, we will call you to review the results.  Testing/Procedures: None ordered  Follow-Up:  2 weeks with Ermalinda Barrios 07/09/19 at 9:30AM

## 2019-07-07 DIAGNOSIS — I10 Essential (primary) hypertension: Secondary | ICD-10-CM | POA: Diagnosis not present

## 2019-07-07 DIAGNOSIS — E1169 Type 2 diabetes mellitus with other specified complication: Secondary | ICD-10-CM | POA: Diagnosis not present

## 2019-07-07 DIAGNOSIS — M1000 Idiopathic gout, unspecified site: Secondary | ICD-10-CM | POA: Diagnosis not present

## 2019-07-07 DIAGNOSIS — D72829 Elevated white blood cell count, unspecified: Secondary | ICD-10-CM | POA: Diagnosis not present

## 2019-07-07 DIAGNOSIS — E782 Mixed hyperlipidemia: Secondary | ICD-10-CM | POA: Diagnosis not present

## 2019-07-08 ENCOUNTER — Telehealth: Payer: Self-pay | Admitting: Physician Assistant

## 2019-07-08 DIAGNOSIS — I10 Essential (primary) hypertension: Secondary | ICD-10-CM | POA: Insufficient documentation

## 2019-07-08 NOTE — Telephone Encounter (Signed)

## 2019-07-08 NOTE — Progress Notes (Signed)
Cardiology Office Note    Date:  07/09/2019   ID:  Diane Mayo, DOB [birth year], MRN [id number]  PCP:  Maury Dus, MD  Cardiologist: Larae Grooms, MD EPS: None  Chief Complaint  Patient presents with   Follow-up    History of Present Illness:  Diane Mayo is a 68 y.o. female with history of HTN, LBBB, obesity and family history of CAD-father stents age 51. Saw Dr. Irish Lack 6/29 for HTN- valsartan didn't change BP 140-150 range. Started on irbesartan with plans to wean clonidine if it works. Cough on lisinopril/quinapril in the past.  Patient comes in for f/u. BP running 140/80. Can't exercise with leg problem. Denies chest pain, palpitations, dyspnea, dyspnea on exertion, dizziness or presyncope. Having trouble with gout and arthritis. Has cut back on sugars and dairy.     Past Medical History:  Diagnosis Date   Genital herpes    GERD (gastroesophageal reflux disease)    Hypertension    Interstitial cystitis    Leukocytosis    Osteoarthritis    Plantar fasciitis     Past Surgical History:  Procedure Laterality Date   CARPAL TUNNEL RELEASE Bilateral    CESAREAN SECTION     twice   SHOULDER SURGERY     TENDON REPAIR     site not given   TUBAL LIGATION  1987    Current Medications: Current Meds  Medication Sig   allopurinol (ZYLOPRIM) 100 MG tablet Take 1 tablet (100 mg total) by mouth daily.   amLODipine (NORVASC) 10 MG tablet Take 10 mg by mouth daily.   cetirizine (ZYRTEC) 10 MG tablet Take 10 mg by mouth daily.   cloNIDine (CATAPRES) 0.1 MG tablet Take 0.1 mg by mouth 2 (two) times daily.   colchicine 0.6 MG tablet Take 1 tablet (0.6 mg total) by mouth 2 (two) times daily.   irbesartan (AVAPRO) 75 MG tablet Take 1 tablet (75 mg total) by mouth daily.   metFORMIN (GLUCOPHAGE) 1000 MG tablet Take 1,000 mg by mouth 2 (two) times daily with a meal.   Metoprolol Tartrate 75 MG TABS Take 1 tablet by mouth 2 (two) times daily.    naproxen sodium (ANAPROX) 220 MG tablet Take 440 mg by mouth 2 (two) times daily as needed. PAIN     omeprazole (PRILOSEC) 40 MG capsule Take 40 mg by mouth daily.    pentosan polysulfate (ELMIRON) 100 MG capsule Take 200 mg by mouth 2 (two) times daily.     pravastatin (PRAVACHOL) 40 MG tablet Take 40 mg by mouth daily.   [DISCONTINUED] irbesartan (AVAPRO) 75 MG tablet Take 1 tablet (75 mg total) by mouth daily.     Allergies:   Lisinopril, Miconazole, and Penicillins   Social History   Socioeconomic History   Marital status: Married    Spouse name: Not on file   Number of children: Not on file   Years of education: Not on file   Highest education level: Not on file  Occupational History   Not on file  Social Needs   Financial resource strain: Not on file   Food insecurity    Worry: Not on file    Inability: Not on file   Transportation needs    Medical: Not on file    Non-medical: Not on file  Tobacco Use   Smoking status: Never Smoker   Smokeless tobacco: Never Used  Substance and Sexual Activity   Alcohol use: No   Drug use: No  Sexual activity: Not on file  Lifestyle   Physical activity    Days per week: Not on file    Minutes per session: Not on file   Stress: Not on file  Relationships   Social connections    Talks on phone: Not on file    Gets together: Not on file    Attends religious service: Not on file    Active member of club or organization: Not on file    Attends meetings of clubs or organizations: Not on file    Relationship status: Not on file  Other Topics Concern   Not on file  Social History Narrative   Not on file     Family History:  The patient's   family history includes Alcoholism in her brother; Asthma in her brother; CAD in her father; CVA in her father; Diabetes in her mother; Diabetes type II in her maternal grandfather; Gout in her father; Heart attack in her father; Hypertension in her father and mother;  Kidney failure in her father; Other in her brother and mother; Pneumonia in her mother; Skin cancer in her father.   ROS:   Please see the history of present illness.    Review of Systems  Musculoskeletal: Positive for arthritis, gout, joint pain, joint swelling and stiffness.   All other systems reviewed and are negative.   PHYSICAL EXAM:   VS:  BP 140/66    Pulse 65    Ht 5\' 1"  (1.549 m)    Wt 218 lb 12.8 oz (99.2 kg)    SpO2 97%    BMI 41.34 kg/m   Physical Exam  NOM:VEHMC, in no acute distress  Neck: no JVD, carotid bruits, or masses Cardiac:RRR; no murmurs, rubs, or gallops  Respiratory:  clear to auscultation bilaterally, normal work of breathing GI: soft, nontender, nondistended, + BS Ext: without cyanosis, clubbing, or edema, Good distal pulses bilaterally Neuro:  Alert and Oriented x 3 Psych: euthymic mood, full affect  Wt Readings from Last 3 Encounters:  07/09/19 218 lb 12.8 oz (99.2 kg)  06/23/19 210 lb (95.3 kg)  01/10/18 231 lb (104.8 kg)      Studies/Labs Reviewed:   EKG:  EKG is ordered today.  The ekg ordered today demonstrates NSR with first degree AV block LAFB LVH and TWI ant-new since last EKG which was LBBB  Recent Labs: No results found for requested labs within last 8760 hours.   Lipid Panel No results found for: CHOL, TRIG, HDL, CHOLHDL, VLDL, LDLCALC, LDLDIRECT  Additional studies/ records that were reviewed today include:  Renal US 06/02/19 IMPRESSION: No acute finding by renal ultrasound   Negative for significant renal artery stenosis by renal duplex     Electronically Signed   By: Jerilynn Mages.  Shick M.D.   On: 06/02/2019 09:31       ASSESSMENT:    1. Essential hypertension   2. LBBB (left bundle branch block)   3. Morbid obesity ([place])   4. Hyperlipidemia, unspecified hyperlipidemia type   5. Type 2 diabetes mellitus with complication, without long-term current use of insulin (HCC)      PLAN:  In order of problems listed  above:  Essential HTN renal US normal 06/02/19- metanephrine normal 07/07/19 BP stable today and at home. Continue irbesartan. Crt stable 07/07/19 by PCP. Will order 2Decho for HTN and and EKG changes.  LBBB-chronic but not today-see above  Obesity-can't exercise. Recommend weight loss program such as weight watchers  HLD on pravachol LDL  48 07/07/19  DM on statin. Last A1C 6.2    Medication Adjustments/Labs and Tests Ordered: Current medicines are reviewed at length with the patient today.  Concerns regarding medicines are outlined above.  Medication changes, Labs and Tests ordered today are listed in the Patient Instructions below. Patient Instructions  Medication Instructions:  Your physician recommends that you continue on your current medications as directed. Please refer to the Current Medication list given to you today.  If you need a refill on your cardiac medications before your next appointment, please call your pharmacy.   Lab work: None Ordered  If you have labs (blood work) drawn today and your tests are completely normal, you will receive your results only by:  [place] (if you have MyChart) OR  A paper copy in the mail If you have any lab test that is abnormal or we need to change your treatment, we will call you to review the results.  Testing/Procedures: Your physician has requested that you have an echocardiogram. Echocardiography is a painless test that uses sound waves to create images of your heart. It provides your doctor with information about the size and shape of your heart and how well your hearts chambers and valves are working. This procedure takes approximately one hour. There are no restrictions for this procedure.  Follow-Up: At [facility], you and your health needs are our priority.  As part of our continuing mission to provide you with exceptional heart care, we have created designated Provider Care Teams.  These Care Teams include your  primary Cardiologist (physician) and Advanced Practice Providers (APPs -  Physician Assistants and Nurse Practitioners) who all work together to provide you with the care you need, when you need it.  You will need a follow up appointment in 4-6 months.  Please call our office 2 months in advance to schedule this appointment.  You may see Casandra Doffing, MD or one of the following Advanced Practice Providers on your designated Care Team:    Lyda Jester, PA-C  Dayna Dunn, PA-C  Ermalinda Barrios, PA-C  Any Other Special Instructions Will Be Listed Below (If Applicable).   Echocardiogram An echocardiogram is a procedure that uses painless sound waves (ultrasound) to produce an image of the heart. Images from an echocardiogram can provide important information about:  Signs of coronary artery disease (CAD).  Aneurysm detection. An aneurysm is a weak or damaged part of an artery wall that bulges out from the normal force of blood pumping through the body.  Heart size and shape. Changes in the size or shape of the heart can be associated with certain conditions, including heart failure, aneurysm, and CAD.  Heart muscle function.  Heart valve function.  Signs of a past heart attack.  Fluid buildup around the heart.  Thickening of the heart muscle.  A tumor or infectious growth around the heart valves. Tell a health care provider about:  Any allergies you have.  All medicines you are taking, including vitamins, herbs, eye drops, creams, and over-the-counter medicines.  Any blood disorders you have.  Any surgeries you have had.  Any medical conditions you have.  Whether you are pregnant or may be pregnant. What are the risks? Generally, this is a safe procedure. However, problems may occur, including:  Allergic reaction to dye (contrast) that may be used during the procedure. What happens before the procedure? No specific preparation is needed. You may eat and drink  normally. What happens during the procedure?  An IV tube may be inserted into one of your veins.  You may receive contrast through this tube. A contrast is an injection that improves the quality of the pictures from your heart.  A gel will be applied to your chest.  A wand-like tool (transducer) will be moved over your chest. The gel will help to transmit the sound waves from the transducer.  The sound waves will harmlessly bounce off of your heart to allow the heart images to be captured in real-time motion. The images will be recorded on a computer. The procedure may vary among health care providers and hospitals. What happens after the procedure?  You may return to your normal, everyday life, including diet, activities, and medicines, unless your health care provider tells you not to do that. Summary  An echocardiogram is a procedure that uses painless sound waves (ultrasound) to produce an image of the heart.  Images from an echocardiogram can provide important information about the size and shape of your heart, heart muscle function, heart valve function, and fluid buildup around your heart.  You do not need to do anything to prepare before this procedure. You may eat and drink normally.  After the echocardiogram is completed, you may return to your normal, everyday life, unless your health care provider tells you not to do that. This information is not intended to replace advice given to you by your health care provider. Make sure you discuss any questions you have with your health care provider. Document Released: 12/08/2000 Document Revised: 04/03/2019 Document Reviewed: 01/13/2017 Elsevier Patient Education  2020 [place], Ermalinda Barrios, [place]  07/09/2019 9:57 AM    [facility] [address], [place], [place]  [address] Phone: [phone]; Fax: [phone]

## 2019-07-09 ENCOUNTER — Encounter: Payer: Self-pay | Admitting: Physician Assistant

## 2019-07-09 ENCOUNTER — Other Ambulatory Visit: Payer: Self-pay

## 2019-07-09 ENCOUNTER — Ambulatory Visit: Payer: PPO | Admitting: Physician Assistant

## 2019-07-09 VITALS — BP 140/66 | HR 65 | Ht 61.0 in | Wt 218.8 lb

## 2019-07-09 DIAGNOSIS — I447 Left bundle-branch block, unspecified: Secondary | ICD-10-CM

## 2019-07-09 DIAGNOSIS — I10 Essential (primary) hypertension: Secondary | ICD-10-CM

## 2019-07-09 DIAGNOSIS — E785 Hyperlipidemia, unspecified: Secondary | ICD-10-CM | POA: Diagnosis not present

## 2019-07-09 DIAGNOSIS — E118 Type 2 diabetes mellitus with unspecified complications: Secondary | ICD-10-CM

## 2019-07-09 MED ORDER — IRBESARTAN 75 MG PO TABS: 75.0000 mg | ORAL_TABLET | Freq: Every day | ORAL | 3 refills | Status: DC

## 2019-07-09 NOTE — Patient Instructions (Signed)
Medication Instructions:  Your physician recommends that you continue on your current medications as directed. Please refer to the Current Medication list given to you today.  If you need a refill on your cardiac medications before your next appointment, please call your pharmacy.   Lab work: None Ordered  If you have labs (blood work) drawn today and your tests are completely normal, you will receive your results only by: Marland Kitchen MyChart Message (if you have MyChart) OR . A paper copy in the mail If you have any lab test that is abnormal or we need to change your treatment, we will call you to review the results.  Testing/Procedures: Your physician has requested that you have an echocardiogram. Echocardiography is a painless test that uses sound waves to create images of your heart. It provides your doctor with information about the size and shape of your heart and how well your heart's chambers and valves are working. This procedure takes approximately one hour. There are no restrictions for this procedure.  Follow-Up: At [facility], you and your health needs are our priority.  As part of our continuing mission to provide you with exceptional heart care, we have created designated Provider Care Teams.  These Care Teams include your primary Cardiologist (physician) and Advanced Practice Providers (APPs -  Physician Assistants and Nurse Practitioners) who all work together to provide you with the care you need, when you need it. . You will need a follow up appointment in 4-6 months.  Please call our office 2 months in advance to schedule this appointment.  You may see Casandra Doffing, MD or one of the following Advanced Practice Providers on your designated Care Team:   . Lyda Jester, PA-C . Dayna Dunn, PA-C . Ermalinda Barrios, PA-C  Any Other Special Instructions Will Be Listed Below (If Applicable).   Echocardiogram An echocardiogram is a procedure that uses painless sound waves  (ultrasound) to produce an image of the heart. Images from an echocardiogram can provide important information about:  Signs of coronary artery disease (CAD).  Aneurysm detection. An aneurysm is a weak or damaged part of an artery wall that bulges out from the normal force of blood pumping through the body.  Heart size and shape. Changes in the size or shape of the heart can be associated with certain conditions, including heart failure, aneurysm, and CAD.  Heart muscle function.  Heart valve function.  Signs of a past heart attack.  Fluid buildup around the heart.  Thickening of the heart muscle.  A tumor or infectious growth around the heart valves. Tell a health care provider about:  Any allergies you have.  All medicines you are taking, including vitamins, herbs, eye drops, creams, and over-the-counter medicines.  Any blood disorders you have.  Any surgeries you have had.  Any medical conditions you have.  Whether you are pregnant or may be pregnant. What are the risks? Generally, this is a safe procedure. However, problems may occur, including:  Allergic reaction to dye (contrast) that may be used during the procedure. What happens before the procedure? No specific preparation is needed. You may eat and drink normally. What happens during the procedure?   An IV tube may be inserted into one of your veins.  You may receive contrast through this tube. A contrast is an injection that improves the quality of the pictures from your heart.  A gel will be applied to your chest.  A wand-like tool (transducer) will be moved over your  chest. The gel will help to transmit the sound waves from the transducer.  The sound waves will harmlessly bounce off of your heart to allow the heart images to be captured in real-time motion. The images will be recorded on a computer. The procedure may vary among health care providers and hospitals. What happens after the procedure?   You may return to your normal, everyday life, including diet, activities, and medicines, unless your health care provider tells you not to do that. Summary  An echocardiogram is a procedure that uses painless sound waves (ultrasound) to produce an image of the heart.  Images from an echocardiogram can provide important information about the size and shape of your heart, heart muscle function, heart valve function, and fluid buildup around your heart.  You do not need to do anything to prepare before this procedure. You may eat and drink normally.  After the echocardiogram is completed, you may return to your normal, everyday life, unless your health care provider tells you not to do that. This information is not intended to replace advice given to you by your health care provider. Make sure you discuss any questions you have with your health care provider. Document Released: 12/08/2000 Document Revised: 04/03/2019 Document Reviewed: 01/13/2017 Elsevier Patient Education  2020 [employer].

## 2019-07-16 ENCOUNTER — Other Ambulatory Visit: Payer: Self-pay

## 2019-07-16 ENCOUNTER — Ambulatory Visit (HOSPITAL_COMMUNITY): Payer: PPO | Attending: Cardiovascular Disease

## 2019-07-16 DIAGNOSIS — I10 Essential (primary) hypertension: Secondary | ICD-10-CM | POA: Diagnosis not present

## 2019-08-24 ENCOUNTER — Other Ambulatory Visit: Payer: Self-pay | Admitting: Podiatry

## 2019-09-22 ENCOUNTER — Ambulatory Visit (INDEPENDENT_AMBULATORY_CARE_PROVIDER_SITE_OTHER): Payer: PPO

## 2019-09-22 ENCOUNTER — Ambulatory Visit: Payer: Self-pay

## 2019-09-22 ENCOUNTER — Encounter: Payer: Self-pay | Admitting: Physician Assistant

## 2019-09-22 ENCOUNTER — Ambulatory Visit (INDEPENDENT_AMBULATORY_CARE_PROVIDER_SITE_OTHER): Payer: PPO | Admitting: Physician Assistant

## 2019-09-22 DIAGNOSIS — M79605 Pain in left leg: Secondary | ICD-10-CM | POA: Diagnosis not present

## 2019-09-22 DIAGNOSIS — R269 Unspecified abnormalities of gait and mobility: Secondary | ICD-10-CM | POA: Diagnosis not present

## 2019-09-22 NOTE — Progress Notes (Addendum)
Office Visit Note   Patient: Diane Mayo           Date of Birth: Jun 29, 1951           MRN: [id number] Visit Date: 09/22/2019              Requested by: Maury Dus, MD [address] [place],  [place] [address] PCP: Maury Dus, MD   Assessment & Plan: Visit Diagnoses:  1. lumbar stenosis   2. Gait disturbance     Plan: We will send patient to physical therapy for back exercises, home exercise program modalities.  Also will have therapy work on gait and balance as she has fallen several times.  Did discuss with her obtaining MRI for possible epidural steroid injections she would prefer just to try formal therapy at this time due to the fact that she is severely claustrophobic and has a hard time undergoing MRIs.  See back 4 weeks check progress or lack of.  Follow-Up Instructions: Return in about 4 weeks (around 10/20/2019).   Orders:  Orders Placed This Encounter  Procedures  . XR HIP UNILAT W OR W/O PELVIS 1V LEFT  . XR Lumbar Spine 2-3 Views   No orders of the defined types were placed in this encounter.     Procedures: No procedures performed   Clinical Data: No additional findings.   Subjective: Chief Complaint  Patient presents with  . Lower Back - Pain  . Left Hip - Pain    HPI Patient 68 year old female were seen for the first time due to left leg pain and left leg weakness.  She states if she stands for any period of time she has pain in low back that radiates down posterior aspect of leg and at times down to the knee.  She states the pain resolves with sitting down.  She has had no pain with sitting or lying down.  Occasionally has some waking pain whenever she rolls over on her right side.  She has fallen several times due to the left leg weakness and pain.  She denies any groin pain.  She denies any dizziness lightheadedness.  No bowel bladder dysfunction no saddle anesthesia like symptoms.  Denies any injury to the lumbar spine.  She is  diabetic and reports last hemoglobin A1c was 6.9.  Review of Systems  See HPI  Objective: Vital Signs: There were no vitals taken for this visit.  Physical Exam Constitutional:      Appearance: She is not ill-appearing or diaphoretic.  Pulmonary:     Effort: Pulmonary effort is normal.  Neurological:     Mental Status: She is alert and oriented to person, place, and time.  Psychiatric:        Mood and Affect: Mood normal.     Ortho Exam 5 out of 5 strength throughout lower extremities against resistance.  Tight hamstrings bilaterally.  Negative straight leg raise bilaterally.  Limited flexion-extension lumbar spine.  Sensation grossly intact bilateral feet to light touch.  Posterior tibial pulses are intact bilaterally.  Reflexes at knees and ankles are equal and symmetric.  Specialty Comments:  No specialty comments available.  Imaging: Xr Lumbar Spine 2-3 Views  Result Date: 09/22/2019 AP lateral flexion-extension lumbar spine: Degenerative changes throughout lumbar spine.  Grade 1 anterior spondylolisthesis L4 on 5.  Lower lumbar spine with significant facet joint arthritic changes.  No acute fracture.  Flexion-extension views showed no subluxation of the L4 on L5 anterior spondylolisthesis.  PMFS History: Patient Active Problem List   Diagnosis Date Noted  . Essential hypertension 07/08/2019  . Candidal vulvovaginitis 01/25/2019  . Diabetes mellitus ([place]) 01/25/2019  . Menopausal symptom 01/25/2019  . Obesity 01/25/2019  . LBBB (left bundle branch block) 01/10/2018  . Acute cystitis with hematuria 12/10/2014  . Burning with urination 11/11/2014  . Frequency of urination 08/13/2013  . Chronic interstitial cystitis 08/07/2012   Past Medical History:  Diagnosis Date  . Genital herpes   . GERD (gastroesophageal reflux disease)   . Hypertension   . Interstitial cystitis   . Leukocytosis   . Osteoarthritis   . Plantar fasciitis     Family History  Problem  Relation Age of Onset  . Hypertension Mother   . Diabetes Mother   . Other Mother        hay fever  . Pneumonia Mother        aspiration  . Gout Father   . Skin cancer Father   . Heart attack Father   . Kidney failure Father   . Hypertension Father   . CVA Father   . CAD Father   . Asthma Brother   . Other Brother        hay fever  . Diabetes type II Maternal Grandfather   . Alcoholism Brother     Past Surgical History:  Procedure Laterality Date  . CARPAL TUNNEL RELEASE Bilateral   . CESAREAN SECTION     twice  . SHOULDER SURGERY    . TENDON REPAIR     site not given  . TUBAL LIGATION  1987   Social History   Occupational History  . Not on file  Tobacco Use  . Smoking status: Never Smoker  . Smokeless tobacco: Never Used  Substance and Sexual Activity  . Alcohol use: No  . Drug use: No  . Sexual activity: Not on file

## 2019-10-01 DIAGNOSIS — R260 Ataxic gait: Secondary | ICD-10-CM | POA: Diagnosis not present

## 2019-10-01 DIAGNOSIS — M5416 Radiculopathy, lumbar region: Secondary | ICD-10-CM | POA: Diagnosis not present

## 2019-10-04 ENCOUNTER — Other Ambulatory Visit: Payer: Self-pay | Admitting: Podiatry

## 2019-10-06 ENCOUNTER — Other Ambulatory Visit: Payer: Self-pay | Admitting: Podiatry

## 2019-10-06 MED ORDER — ALLOPURINOL 100 MG PO TABS: 100.0000 mg | ORAL_TABLET | Freq: Every day | ORAL | 3 refills | Status: DC

## 2019-10-07 DIAGNOSIS — R260 Ataxic gait: Secondary | ICD-10-CM | POA: Diagnosis not present

## 2019-10-07 DIAGNOSIS — M5416 Radiculopathy, lumbar region: Secondary | ICD-10-CM | POA: Diagnosis not present

## 2019-10-10 DIAGNOSIS — M5416 Radiculopathy, lumbar region: Secondary | ICD-10-CM | POA: Diagnosis not present

## 2019-10-10 DIAGNOSIS — R260 Ataxic gait: Secondary | ICD-10-CM | POA: Diagnosis not present

## 2019-10-20 ENCOUNTER — Ambulatory Visit: Payer: PPO | Admitting: Physician Assistant

## 2019-10-20 DIAGNOSIS — I10 Essential (primary) hypertension: Secondary | ICD-10-CM | POA: Diagnosis not present

## 2019-10-20 DIAGNOSIS — R260 Ataxic gait: Secondary | ICD-10-CM | POA: Diagnosis not present

## 2019-10-20 DIAGNOSIS — M5416 Radiculopathy, lumbar region: Secondary | ICD-10-CM | POA: Diagnosis not present

## 2019-10-20 DIAGNOSIS — E782 Mixed hyperlipidemia: Secondary | ICD-10-CM | POA: Diagnosis not present

## 2019-10-20 DIAGNOSIS — E1169 Type 2 diabetes mellitus with other specified complication: Secondary | ICD-10-CM | POA: Diagnosis not present

## 2019-10-20 DIAGNOSIS — E119 Type 2 diabetes mellitus without complications: Secondary | ICD-10-CM | POA: Diagnosis not present

## 2019-10-20 DIAGNOSIS — M47812 Spondylosis without myelopathy or radiculopathy, cervical region: Secondary | ICD-10-CM | POA: Diagnosis not present

## 2019-10-22 ENCOUNTER — Ambulatory Visit: Payer: PPO | Admitting: Orthopaedic Surgery

## 2019-10-23 DIAGNOSIS — Z1231 Encounter for screening mammogram for malignant neoplasm of breast: Secondary | ICD-10-CM | POA: Diagnosis not present

## 2019-10-23 DIAGNOSIS — M5416 Radiculopathy, lumbar region: Secondary | ICD-10-CM | POA: Diagnosis not present

## 2019-10-23 DIAGNOSIS — R260 Ataxic gait: Secondary | ICD-10-CM | POA: Diagnosis not present

## 2019-10-27 ENCOUNTER — Ambulatory Visit: Payer: PPO | Admitting: Physician Assistant

## 2019-10-27 ENCOUNTER — Other Ambulatory Visit: Payer: Self-pay

## 2019-10-27 ENCOUNTER — Encounter: Payer: Self-pay | Admitting: Physician Assistant

## 2019-10-27 DIAGNOSIS — M4807 Spinal stenosis, lumbosacral region: Secondary | ICD-10-CM

## 2019-10-27 DIAGNOSIS — R260 Ataxic gait: Secondary | ICD-10-CM | POA: Diagnosis not present

## 2019-10-27 DIAGNOSIS — M5416 Radiculopathy, lumbar region: Secondary | ICD-10-CM | POA: Diagnosis not present

## 2019-10-27 MED ORDER — DIAZEPAM 5 MG PO TABS: ORAL_TABLET | ORAL | 0 refills | Status: DC

## 2019-10-27 NOTE — Addendum Note (Signed)
Addended by: Michae Kava B on: 10/27/2019 03:03 PM   Modules accepted: Orders

## 2019-10-27 NOTE — Progress Notes (Signed)
Office Visit Note   Patient: Diane Mayo           Date of Birth: Dec 14, 1951           MRN: [id number] Visit Date: 10/27/2019              Requested by: Maury Dus, MD [address] [place],  [place] [address] PCP: Maury Dus, MD   Assessment & Plan: Visit Diagnoses:  1. Lumbar radicular pain     Plan: She will continue work with therapy developing home exercise program.  However due to the fact that she continues to have pain down the left leg and numbness down to the ankle recommend MRI.  We will obtain an open MRI of her lumbar spine hemp as a source of her radicular symptoms.  Did call in some Valium due to her claustrophobia for her to take prior to the MRI.  Follow-Up Instructions: Return After MRI.   Orders:  No orders of the defined types were placed in this encounter.  No orders of the defined types were placed in this encounter.     Procedures: No procedures performed   Clinical Data: No additional findings.   Subjective: Chief Complaint  Patient presents with  . Lower Back - Follow-up    HPI Mrs. Basa returns today follow-up of her low back pain radicular symptoms left leg and gait/balance disturbance.  She feels therapy is definitely helped with her back pain states she still unable to walk for greater than 3 minutes due to the pain down her left leg and the numbness tingling.  Numbness goes down to the ankle.  She denies any bowel bladder dysfunction saddle anesthesia like symptoms waking pain.  Review of Systems Denies any fevers chills shortness of breath chest pain  Objective: Vital Signs: There were no vitals taken for this visit.  Physical Exam General: Well-developed well-nourished female no acute distress mood and affect appropriate Ortho Exam Lower extremities negative straight leg raise bilaterally.  5 5 strength throughout the lower extremities against resistance.  Tenderness over the lumbar spinal column.  No  tenderness paraspinous region bilaterally.  Limited flexion-extension lumbar spine. Specialty Comments:  No specialty comments available.  Imaging: No results found.   PMFS History: Patient Active Problem List   Diagnosis Date Noted  . Essential hypertension 07/08/2019  . Candidal vulvovaginitis 01/25/2019  . Diabetes mellitus ([place]) 01/25/2019  . Menopausal symptom 01/25/2019  . Obesity 01/25/2019  . LBBB (left bundle branch block) 01/10/2018  . Acute cystitis with hematuria 12/10/2014  . Burning with urination 11/11/2014  . Frequency of urination 08/13/2013  . Chronic interstitial cystitis 08/07/2012   Past Medical History:  Diagnosis Date  . Genital herpes   . GERD (gastroesophageal reflux disease)   . Hypertension   . Interstitial cystitis   . Leukocytosis   . Osteoarthritis   . Plantar fasciitis     Family History  Problem Relation Age of Onset  . Hypertension Mother   . Diabetes Mother   . Other Mother        hay fever  . Pneumonia Mother        aspiration  . Gout Father   . Skin cancer Father   . Heart attack Father   . Kidney failure Father   . Hypertension Father   . CVA Father   . CAD Father   . Asthma Brother   . Other Brother        hay  fever  . Diabetes type II Maternal Grandfather   . Alcoholism Brother     Past Surgical History:  Procedure Laterality Date  . CARPAL TUNNEL RELEASE Bilateral   . CESAREAN SECTION     twice  . SHOULDER SURGERY    . TENDON REPAIR     site not given  . TUBAL LIGATION  1987   Social History   Occupational History  . Not on file  Tobacco Use  . Smoking status: Never Smoker  . Smokeless tobacco: Never Used  Substance and Sexual Activity  . Alcohol use: No  . Drug use: No  . Sexual activity: Not on file

## 2019-10-28 ENCOUNTER — Telehealth: Payer: Self-pay | Admitting: Radiology

## 2019-10-28 NOTE — Telephone Encounter (Signed)
Called in valium Rx to pharmacy

## 2019-10-29 DIAGNOSIS — M5416 Radiculopathy, lumbar region: Secondary | ICD-10-CM | POA: Diagnosis not present

## 2019-10-29 DIAGNOSIS — Z23 Encounter for immunization: Secondary | ICD-10-CM | POA: Diagnosis not present

## 2019-10-29 DIAGNOSIS — R260 Ataxic gait: Secondary | ICD-10-CM | POA: Diagnosis not present

## 2019-11-03 DIAGNOSIS — R260 Ataxic gait: Secondary | ICD-10-CM | POA: Diagnosis not present

## 2019-11-03 DIAGNOSIS — M5416 Radiculopathy, lumbar region: Secondary | ICD-10-CM | POA: Diagnosis not present

## 2019-11-06 DIAGNOSIS — R82998 Other abnormal findings in urine: Secondary | ICD-10-CM | POA: Diagnosis not present

## 2019-11-06 DIAGNOSIS — N3010 Interstitial cystitis (chronic) without hematuria: Secondary | ICD-10-CM | POA: Diagnosis not present

## 2019-11-06 DIAGNOSIS — M5416 Radiculopathy, lumbar region: Secondary | ICD-10-CM | POA: Diagnosis not present

## 2019-11-06 DIAGNOSIS — R260 Ataxic gait: Secondary | ICD-10-CM | POA: Diagnosis not present

## 2019-11-07 ENCOUNTER — Ambulatory Visit
Admission: RE | Admit: 2019-11-07 | Discharge: 2019-11-07 | Disposition: A | Discharge status: Home / Self Care | Payer: PPO | Source: Ambulatory Visit | Attending: Physician Assistant | Admitting: Physician Assistant

## 2019-11-07 ENCOUNTER — Other Ambulatory Visit: Payer: Self-pay

## 2019-11-07 DIAGNOSIS — M4807 Spinal stenosis, lumbosacral region: Secondary | ICD-10-CM

## 2019-11-07 DIAGNOSIS — M48061 Spinal stenosis, lumbar region without neurogenic claudication: Secondary | ICD-10-CM | POA: Diagnosis not present

## 2019-11-10 ENCOUNTER — Ambulatory Visit (INDEPENDENT_AMBULATORY_CARE_PROVIDER_SITE_OTHER): Payer: PPO | Admitting: Physician Assistant

## 2019-11-10 ENCOUNTER — Other Ambulatory Visit: Payer: Self-pay

## 2019-11-10 ENCOUNTER — Encounter: Payer: Self-pay | Admitting: Physician Assistant

## 2019-11-10 DIAGNOSIS — M4807 Spinal stenosis, lumbosacral region: Secondary | ICD-10-CM

## 2019-11-10 DIAGNOSIS — M5416 Radiculopathy, lumbar region: Secondary | ICD-10-CM

## 2019-11-10 NOTE — Progress Notes (Signed)
Diane Mayo returns today to go over the MRI of her lumbar spine.  She continues to have radicular symptoms down her left leg inability to stand or walk for greater than 3 minutes due to the pain down the left leg.  She is tried medications and therapy without any resolution to her pain.  She states therapy definitely has helped but still having radicular symptoms down the leg and unable to walk for long period of time.  She denies any bowel bladder dysfunction saddle anesthesia like symptoms or waking symptoms. MRI images are reviewed with the patient today.  MRI dated 11/07/2019 showed severe spinal stenosis at L4-5 with a 6 mm anterior spondylolisthesis at this level.  Also severe facet hypertrophy bilaterally at L4-5 and severe subarticular stenosis bilaterally at L4-5.  L5-S1 3 mm anterior spondylolisthesis.  Mild facet changes and mild subarticular stenosis bilaterally left greater than right.  Moderate spinal stenosis at L3-4 with moderate subarticular and foraminal stenosis bilaterally.  Impression: Low back pain with radicular symptoms left leg Gait balance disturbance.  Plan: Due to the fact that she is failed conservative treatment and given her MRI findings will refer her to spine surgery for further recommendations.  She follow-up with [place] on a as needed basis.  Questions encouraged and answered at length

## 2019-11-11 DIAGNOSIS — M5416 Radiculopathy, lumbar region: Secondary | ICD-10-CM | POA: Diagnosis not present

## 2019-11-11 DIAGNOSIS — R260 Ataxic gait: Secondary | ICD-10-CM | POA: Diagnosis not present

## 2019-11-18 DIAGNOSIS — M48062 Spinal stenosis, lumbar region with neurogenic claudication: Secondary | ICD-10-CM | POA: Diagnosis not present

## 2019-11-24 NOTE — Progress Notes (Deleted)
Cardiology Office Note   Date:  11/24/2019   ID:  Diane Mayo, DOB 05/14/1951, MRN [id number]  PCP:  Maury Dus, MD    No chief complaint on file.  HTN  Wt Readings from Last 3 Encounters:  07/09/19 218 lb 12.8 oz (99.2 kg)  06/23/19 210 lb (95.3 kg)  01/10/18 231 lb (104.8 kg)       History of Present Illness: Diane Mayo is a 68 y.o. female  Who has had difficult to control BP. She gets readings at home in the 140/85 range.   She does not exercisebecause of her back and leg pain.She has not tried a recumbent bike or water aerobics.  Her father had CAD. He had stents placed at age 80.   The patient does not have symptoms concerning for COVID-19 infection (fever, chills, cough, or new shortness of breath).   BP has been in the A999333 range systolic.  She did try the valsartan for 3 weeks, but BP did not change.  She had a cough with lisinopril / quinapril in the past.  In July 2020, she saw Estella Husk and plan was : "Essential HTN renal US normal 06/02/19- metanephrine normal 07/07/19 BP stable today and at home. Continue irbesartan. Crt stable 07/07/19 by PCP. Will order 2Decho for HTN and and EKG changes."  06/2019 echo: "The left ventricle has low normal systolic function, with an ejection fraction of 50-55%. The cavity size was normal. Left ventricular diastolic Doppler parameters are consistent with pseudonormalization. Elevated mean left atrial pressure There is  abnormal septal motion consistent with left bundle branch block.  2. The right ventricle has normal systolic function. The cavity was normal. There is no increase in right ventricular wall thickness.  3. The aorta is normal in size and structure."  Past Medical History:  Diagnosis Date   Genital herpes    GERD (gastroesophageal reflux disease)    Hypertension    Interstitial cystitis    Leukocytosis    Osteoarthritis    Plantar fasciitis     Past Surgical History:    Procedure Laterality Date   CARPAL TUNNEL RELEASE Bilateral    CESAREAN SECTION     twice   SHOULDER SURGERY     TENDON REPAIR     site not given   TUBAL LIGATION  1987     Current Outpatient Medications  Medication Sig Dispense Refill   allopurinol (ZYLOPRIM) 100 MG tablet Take 1 tablet (100 mg total) by mouth daily. 30 tablet 3   amLODipine (NORVASC) 10 MG tablet Take 10 mg by mouth daily.     cetirizine (ZYRTEC) 10 MG tablet Take 10 mg by mouth daily.     cloNIDine (CATAPRES) 0.1 MG tablet Take 0.1 mg by mouth 2 (two) times daily.     colchicine 0.6 MG tablet TAKE 1 TABLET BY MOUTH TWICE A DAY 30 tablet 0   diazepam (VALIUM) 5 MG tablet TAKE ONE TAB ONE HOUR PRIOR TO MRI REPEAT AS NEEDED #2 . ZERO REFILLS 2 tablet 0   irbesartan (AVAPRO) 75 MG tablet Take 1 tablet (75 mg total) by mouth daily. 90 tablet 3   metFORMIN (GLUCOPHAGE) 1000 MG tablet Take 1,000 mg by mouth 2 (two) times daily with a meal.     Metoprolol Tartrate 75 MG TABS Take 1 tablet by mouth 2 (two) times daily.     naproxen sodium (ANAPROX) 220 MG tablet Take 440 mg by mouth 2 (two) times daily as  needed. PAIN       omeprazole (PRILOSEC) 40 MG capsule Take 40 mg by mouth daily.      pentosan polysulfate (ELMIRON) 100 MG capsule Take 200 mg by mouth 2 (two) times daily.       pravastatin (PRAVACHOL) 40 MG tablet Take 40 mg by mouth daily.     No current facility-administered medications for this visit.     Allergies:   Lisinopril, Miconazole, and Penicillins    Social History:  The patient  reports that she has never smoked. She has never used smokeless tobacco. She reports that she does not drink alcohol or use drugs.   Family History:  The patient's ***family history includes Alcoholism in her brother; Asthma in her brother; CAD in her father; CVA in her father; Diabetes in her mother; Diabetes type II in her maternal grandfather; Gout in her father; Heart attack in her father; Hypertension  in her father and mother; Kidney failure in her father; Other in her brother and mother; Pneumonia in her mother; Skin cancer in her father.    ROS:  Please see the history of present illness.   Otherwise, review of systems are positive for ***.   All other systems are reviewed and negative.    PHYSICAL EXAM: VS:  There were no vitals taken for this visit. , BMI There is no height or weight on file to calculate BMI. GEN: Well nourished, well developed, in no acute distress  HEENT: normal  Neck: no JVD, carotid bruits, or masses Cardiac: ***RRR; no murmurs, rubs, or gallops,no edema  Respiratory:  clear to auscultation bilaterally, normal work of breathing GI: soft, nontender, nondistended, + BS MS: no deformity or atrophy  Skin: warm and dry, no rash Neuro:  Strength and sensation are intact Psych: euthymic mood, full affect   EKG:   The ekg ordered today demonstrates ***   Recent Labs: No results found for requested labs within last 8760 hours.   Lipid Panel No results found for: CHOL, TRIG, HDL, CHOLHDL, VLDL, LDLCALC, LDLDIRECT   Other studies Reviewed: Additional studies/ records that were reviewed today with results demonstrating: ***.   ASSESSMENT AND PLAN:  1. HTN:  2. LBBB: Present intermittently in the past.  3. Obesity: 4. Hyperlipidemia: 5. Type 2 DM:   Current medicines are reviewed at length with the patient today.  The patient concerns regarding her medicines were addressed.  The following changes have been made:  No change***  Labs/ tests ordered today include: *** No orders of the defined types were placed in this encounter.   Recommend 150 minutes/week of aerobic exercise Low fat, low carb, high fiber diet recommended  Disposition:   FU in ***   Signed, Larae Grooms, MD  11/24/2019 10:35 PM    [facility] [address], [place], [place]  [address] Phone: [phone]; Fax: [phone]

## 2019-11-27 ENCOUNTER — Ambulatory Visit: Payer: PPO | Admitting: Interventional Cardiology

## 2019-11-28 DIAGNOSIS — E782 Mixed hyperlipidemia: Secondary | ICD-10-CM | POA: Diagnosis not present

## 2019-11-28 DIAGNOSIS — M47812 Spondylosis without myelopathy or radiculopathy, cervical region: Secondary | ICD-10-CM | POA: Diagnosis not present

## 2019-11-28 DIAGNOSIS — E119 Type 2 diabetes mellitus without complications: Secondary | ICD-10-CM | POA: Diagnosis not present

## 2019-11-28 DIAGNOSIS — E1169 Type 2 diabetes mellitus with other specified complication: Secondary | ICD-10-CM | POA: Diagnosis not present

## 2019-11-28 DIAGNOSIS — I10 Essential (primary) hypertension: Secondary | ICD-10-CM | POA: Diagnosis not present

## 2019-12-08 DIAGNOSIS — I10 Essential (primary) hypertension: Secondary | ICD-10-CM | POA: Diagnosis not present

## 2019-12-08 DIAGNOSIS — Z6825 Body mass index (BMI) 25.0-25.9, adult: Secondary | ICD-10-CM | POA: Diagnosis not present

## 2019-12-08 DIAGNOSIS — M48062 Spinal stenosis, lumbar region with neurogenic claudication: Secondary | ICD-10-CM | POA: Diagnosis not present

## 2019-12-10 DIAGNOSIS — H5213 Myopia, bilateral: Secondary | ICD-10-CM | POA: Diagnosis not present

## 2019-12-10 DIAGNOSIS — H2513 Age-related nuclear cataract, bilateral: Secondary | ICD-10-CM | POA: Diagnosis not present

## 2019-12-10 DIAGNOSIS — E119 Type 2 diabetes mellitus without complications: Secondary | ICD-10-CM | POA: Diagnosis not present

## 2019-12-26 HISTORY — PX: EYE SURGERY: SHX253

## 2019-12-31 DIAGNOSIS — E782 Mixed hyperlipidemia: Secondary | ICD-10-CM | POA: Diagnosis not present

## 2019-12-31 DIAGNOSIS — D72829 Elevated white blood cell count, unspecified: Secondary | ICD-10-CM | POA: Diagnosis not present

## 2019-12-31 DIAGNOSIS — K219 Gastro-esophageal reflux disease without esophagitis: Secondary | ICD-10-CM | POA: Diagnosis not present

## 2019-12-31 DIAGNOSIS — Z0000 Encounter for general adult medical examination without abnormal findings: Secondary | ICD-10-CM | POA: Diagnosis not present

## 2019-12-31 DIAGNOSIS — Z7984 Long term (current) use of oral hypoglycemic drugs: Secondary | ICD-10-CM | POA: Diagnosis not present

## 2019-12-31 DIAGNOSIS — E119 Type 2 diabetes mellitus without complications: Secondary | ICD-10-CM | POA: Diagnosis not present

## 2019-12-31 DIAGNOSIS — I10 Essential (primary) hypertension: Secondary | ICD-10-CM | POA: Diagnosis not present

## 2019-12-31 DIAGNOSIS — E790 Hyperuricemia without signs of inflammatory arthritis and tophaceous disease: Secondary | ICD-10-CM | POA: Diagnosis not present

## 2019-12-31 DIAGNOSIS — Z1389 Encounter for screening for other disorder: Secondary | ICD-10-CM | POA: Diagnosis not present

## 2020-01-02 DIAGNOSIS — M48062 Spinal stenosis, lumbar region with neurogenic claudication: Secondary | ICD-10-CM | POA: Diagnosis not present

## 2020-01-15 DIAGNOSIS — M48062 Spinal stenosis, lumbar region with neurogenic claudication: Secondary | ICD-10-CM | POA: Diagnosis not present

## 2020-01-15 DIAGNOSIS — I10 Essential (primary) hypertension: Secondary | ICD-10-CM | POA: Diagnosis not present

## 2020-01-16 DIAGNOSIS — E119 Type 2 diabetes mellitus without complications: Secondary | ICD-10-CM | POA: Diagnosis not present

## 2020-01-16 DIAGNOSIS — E782 Mixed hyperlipidemia: Secondary | ICD-10-CM | POA: Diagnosis not present

## 2020-01-16 DIAGNOSIS — E790 Hyperuricemia without signs of inflammatory arthritis and tophaceous disease: Secondary | ICD-10-CM | POA: Diagnosis not present

## 2020-01-16 DIAGNOSIS — D72829 Elevated white blood cell count, unspecified: Secondary | ICD-10-CM | POA: Diagnosis not present

## 2020-01-16 DIAGNOSIS — I10 Essential (primary) hypertension: Secondary | ICD-10-CM | POA: Diagnosis not present

## 2020-01-18 NOTE — Progress Notes (Signed)
Cardiology Office Note   Date:  01/20/2020   ID:  Diane Mayo, DOB Nov 03, 1951, MRN [id number]  PCP:  Maury Dus, MD    No chief complaint on file.  HTN  Wt Readings from Last 3 Encounters:  01/20/20 216 lb 3.2 oz (98.1 kg)  07/09/19 218 lb 12.8 oz (99.2 kg)  06/23/19 210 lb (95.3 kg)       History of Present Illness: Diane Mayo is a 69 y.o. female  Who has had difficult to control BP. renal US normal 06/02/19- metanephrine normal 07/07/19   She typically does not exercisebecause of her back and leg pain.  Her father had CAD. He had stents placed at age 22.   The patient does not have symptoms concerning for COVID-19 infection (fever, chills, cough, or new shortness of breath).   BP has been in the A999333 range systolic.  She did try the valsartan for 3 weeks, but BP did not change.  She had a cough with lisinopril / quinapril in the past.  Initial plan in 06/2019 was: "No benefit from valsartan.  She does eat a fair mount of salt.  Avoid processed foods. Will start irbesartan.  She cannot exercise due to leg problems.  Will try to wean clonidine if irbesartan works."  [employer] watchers was recommended.  At f/u, BP was controlled in the 140/80 range.   Since the last visit, BP has been ok.  A999333 systolic.  Most common readings are around [id number] systolic.  She had a back injection.  BP was high that day, but returned back to her normal range.  Back pain improved. She is able to be a little more active.  She is walking some.     Past Medical History:  Diagnosis Date  . Genital herpes   . GERD (gastroesophageal reflux disease)   . Hypertension   . Interstitial cystitis   . Leukocytosis   . Osteoarthritis   . Plantar fasciitis     Past Surgical History:  Procedure Laterality Date  . CARPAL TUNNEL RELEASE Bilateral   . CESAREAN SECTION     twice  . SHOULDER SURGERY    . TENDON REPAIR     site not given  . TUBAL LIGATION  1987     Current  Outpatient Medications  Medication Sig Dispense Refill  . allopurinol (ZYLOPRIM) 100 MG tablet Take 1 tablet (100 mg total) by mouth daily. 30 tablet 3  . amLODipine (NORVASC) 10 MG tablet Take 10 mg by mouth daily.    . Black Pepper-Turmeric (TURMERIC COMPLEX/BLACK PEPPER) 3-500 MG CAPS Take 1 tablet by mouth daily.    . cetirizine (ZYRTEC) 10 MG tablet Take 10 mg by mouth daily.    . cloNIDine (CATAPRES) 0.1 MG tablet Take 0.1 mg by mouth 2 (two) times daily.    . colchicine 0.6 MG tablet TAKE 1 TABLET BY MOUTH TWICE A DAY 30 tablet 0  . irbesartan (AVAPRO) 300 MG tablet Take 1 tablet (300 mg total) by mouth daily. 90 tablet 3  . Melatonin 1 MG TABS Take 1 tablet by mouth at bedtime.    . metFORMIN (GLUCOPHAGE) 1000 MG tablet Take 1,000 mg by mouth 2 (two) times daily with a meal.    . metoprolol tartrate (LOPRESSOR) 25 MG tablet Take 75 mg by mouth 2 (two) times daily.    . naproxen sodium (ANAPROX) 220 MG tablet Take 440 mg by mouth 2 (two) times daily as needed. PAIN      .  nystatin cream (MYCOSTATIN) Apply 1 application topically daily as needed for rash.    . omeprazole (PRILOSEC) 40 MG capsule Take 40 mg by mouth daily.     . pentosan polysulfate (ELMIRON) 100 MG capsule Take 200 mg by mouth 2 (two) times daily.      . pravastatin (PRAVACHOL) 40 MG tablet Take 40 mg by mouth daily.     No current facility-administered medications for this visit.    Allergies:   Lisinopril, Miconazole, and Penicillins    Social History:  The patient  reports that she has never smoked. She has never used smokeless tobacco. She reports that she does not drink alcohol or use drugs.   Family History:  The patient's family history includes Alcoholism in her brother; Asthma in her brother; CAD in her father; CVA in her father; Diabetes in her mother; Diabetes type II in her maternal grandfather; Gout in her father; Heart attack in her father; Hypertension in her father and mother; Kidney failure in her  father; Other in her brother and mother; Pneumonia in her mother; Skin cancer in her father.    ROS:  Please see the history of present illness.   Otherwise, review of systems are positive for back pain.   All other systems are reviewed and negative.    PHYSICAL EXAM: VS:  BP (!) 126/58   Pulse 65   Ht 5\' 1"  (1.549 m)   Wt 216 lb 3.2 oz (98.1 kg)   SpO2 98%   BMI 40.85 kg/m  , BMI Body mass index is 40.85 kg/m. GEN: Well nourished, well developed, in no acute distress  HEENT: normal  Neck: no JVD, carotid bruits, or masses Cardiac: RRR; no murmurs, rubs, or gallops,no edema  Respiratory:  clear to auscultation bilaterally, normal work of breathing GI: soft, nontender, nondistended, + BS MS: no deformity or atrophy  Skin: warm and dry, no rash Neuro:  Strength and sensation are intact Psych: euthymic mood, full affect   EKG:   The ekg ordered in 06/2019 demonstrates NSR, LVH   Recent Labs: No results found for requested labs within last 8760 hours.   Lipid Panel No results found for: CHOL, TRIG, HDL, CHOLHDL, VLDL, LDLCALC, LDLDIRECT   Other studies Reviewed: Additional studies/ records that were reviewed today with results demonstrating: labs reviewed.   ASSESSMENT AND PLAN:  1. HTN: Well controlled today. Does have dry mouth and fatigue.  Increase irbesartan to 300 mg daily.  THen wean off clonidine to try to reduce sude effects.  Currently taking 0.1 in AM, 0.2 in PM.  Will start with changing to 0.1 BID and she will send [place] some BP readings in mychart.  BMet in 1-2 weeks after irbesartan increased. 2. LBBB: Noted on prior ECGs.   3. Morbid obesity: Continue whole food, plant-based diet to try to lose weight.  Avoid salt as well given blood pressure issues. 4. Hyperlipidemia: Continue lipid-lowering therapy. 5. DM: Continue regular exercise and healthy diet to control blood sugar.  Follow-up with PMD. A1C 6.4.   Current medicines are reviewed at length with the  patient today.  The patient concerns regarding her medicines were addressed.  The following changes have been made:  No change  Labs/ tests ordered today include:   Orders Placed This Encounter  Procedures  . Basic metabolic panel    Recommend 150 minutes/week of aerobic exercise Low fat, low carb, high fiber diet recommended  Disposition:   FU in 1 year   Signed,  Larae Grooms, MD  01/20/2020 9:07 AM    [facility] [place], [place], [place]  [address] Phone: [phone]; Fax: [phone]

## 2020-01-20 ENCOUNTER — Ambulatory Visit: Payer: PPO | Admitting: Interventional Cardiology

## 2020-01-20 ENCOUNTER — Other Ambulatory Visit: Payer: Self-pay

## 2020-01-20 ENCOUNTER — Encounter: Payer: Self-pay | Admitting: Interventional Cardiology

## 2020-01-20 VITALS — BP 126/58 | HR 65 | Ht 61.0 in | Wt 216.2 lb

## 2020-01-20 DIAGNOSIS — E118 Type 2 diabetes mellitus with unspecified complications: Secondary | ICD-10-CM

## 2020-01-20 DIAGNOSIS — I447 Left bundle-branch block, unspecified: Secondary | ICD-10-CM

## 2020-01-20 DIAGNOSIS — E785 Hyperlipidemia, unspecified: Secondary | ICD-10-CM | POA: Diagnosis not present

## 2020-01-20 DIAGNOSIS — I10 Essential (primary) hypertension: Secondary | ICD-10-CM

## 2020-01-20 MED ORDER — IRBESARTAN 300 MG PO TABS: 300.0000 mg | ORAL_TABLET | Freq: Every day | ORAL | 3 refills | Status: DC

## 2020-01-20 NOTE — Patient Instructions (Addendum)
Medication Instructions:  Your physician has recommended you make the following change in your medication:   1. DECREASE: clonidine to 0.1 mg twice a day  2. INCREASE: irbesartan to 300 mg once a day  *If you need a refill on your cardiac medications before your next appointment, please call your pharmacy*  Lab Work: Your physician recommends that you return for lab work in 1-2 weeks for BMET  If you have labs (blood work) drawn today and your tests are completely normal, you will receive your results only by: Marland Kitchen MyChart Message (if you have MyChart) OR . A paper copy in the mail If you have any lab test that is abnormal or we need to change your treatment, we will call you to review the results.  Testing/Procedures: None ordered  Follow-Up: At [facility], you and your health needs are our priority.  As part of our continuing mission to provide you with exceptional heart care, we have created designated Provider Care Teams.  These Care Teams include your primary Cardiologist (physician) and Advanced Practice Providers (APPs -  Physician Assistants and Nurse Practitioners) who all work together to provide you with the care you need, when you need it.  Your next appointment:   12 month(s)  The format for your next appointment:   In Person  Provider:   You may see Larae Grooms, MD or one of the following Advanced Practice Providers on your designated Care Team:    Melina Copa, PA-C  Ermalinda Barrios, PA-C   Other Instructions Your physician has requested that you regularly monitor and record your blood pressure readings at home. Please use the same machine at the same time of day to check your readings and send readings through MyChart.

## 2020-02-03 ENCOUNTER — Other Ambulatory Visit: Payer: Self-pay

## 2020-02-03 ENCOUNTER — Other Ambulatory Visit: Payer: PPO

## 2020-02-03 DIAGNOSIS — I447 Left bundle-branch block, unspecified: Secondary | ICD-10-CM

## 2020-02-03 DIAGNOSIS — I10 Essential (primary) hypertension: Secondary | ICD-10-CM | POA: Diagnosis not present

## 2020-02-03 DIAGNOSIS — E785 Hyperlipidemia, unspecified: Secondary | ICD-10-CM

## 2020-02-03 DIAGNOSIS — E118 Type 2 diabetes mellitus with unspecified complications: Secondary | ICD-10-CM

## 2020-02-04 LAB — BASIC METABOLIC PANEL
BUN/Creatinine Ratio: 18 (ref 12–28)
BUN: 11 mg/dL (ref 8–27)
CO2: 24 mmol/L (ref 20–29)
Calcium: 10 mg/dL (ref 8.7–10.3)
Chloride: 102 mmol/L (ref 96–106)
Creatinine, Ser: 0.61 mg/dL (ref 0.57–1.00)
GFR calc Af Amer: 108 mL/min/{1.73_m2} (ref >59)
GFR calc non Af Amer: 94 mL/min/{1.73_m2} (ref >59)
Glucose: 116 mg/dL — ABNORMAL HIGH (ref 65–99)
Potassium: 4.5 mmol/L (ref 3.5–5.2)
Sodium: 141 mmol/L (ref 134–144)

## 2020-02-05 NOTE — Telephone Encounter (Signed)
Patient's dry mouth is gone.

## 2020-02-06 DIAGNOSIS — E782 Mixed hyperlipidemia: Secondary | ICD-10-CM | POA: Diagnosis not present

## 2020-02-06 DIAGNOSIS — E1169 Type 2 diabetes mellitus with other specified complication: Secondary | ICD-10-CM | POA: Diagnosis not present

## 2020-02-06 DIAGNOSIS — M47812 Spondylosis without myelopathy or radiculopathy, cervical region: Secondary | ICD-10-CM | POA: Diagnosis not present

## 2020-02-06 DIAGNOSIS — E119 Type 2 diabetes mellitus without complications: Secondary | ICD-10-CM | POA: Diagnosis not present

## 2020-02-06 DIAGNOSIS — I10 Essential (primary) hypertension: Secondary | ICD-10-CM | POA: Diagnosis not present

## 2020-03-10 DIAGNOSIS — M48062 Spinal stenosis, lumbar region with neurogenic claudication: Secondary | ICD-10-CM | POA: Diagnosis not present

## 2020-03-22 DIAGNOSIS — E119 Type 2 diabetes mellitus without complications: Secondary | ICD-10-CM | POA: Diagnosis not present

## 2020-03-22 DIAGNOSIS — I10 Essential (primary) hypertension: Secondary | ICD-10-CM | POA: Diagnosis not present

## 2020-03-22 DIAGNOSIS — E1169 Type 2 diabetes mellitus with other specified complication: Secondary | ICD-10-CM | POA: Diagnosis not present

## 2020-03-22 DIAGNOSIS — E782 Mixed hyperlipidemia: Secondary | ICD-10-CM | POA: Diagnosis not present

## 2020-03-22 DIAGNOSIS — M47812 Spondylosis without myelopathy or radiculopathy, cervical region: Secondary | ICD-10-CM | POA: Diagnosis not present

## 2020-03-30 DIAGNOSIS — M48062 Spinal stenosis, lumbar region with neurogenic claudication: Secondary | ICD-10-CM | POA: Diagnosis not present

## 2020-04-19 DIAGNOSIS — M47812 Spondylosis without myelopathy or radiculopathy, cervical region: Secondary | ICD-10-CM | POA: Diagnosis not present

## 2020-04-19 DIAGNOSIS — I10 Essential (primary) hypertension: Secondary | ICD-10-CM | POA: Diagnosis not present

## 2020-04-19 DIAGNOSIS — E782 Mixed hyperlipidemia: Secondary | ICD-10-CM | POA: Diagnosis not present

## 2020-04-19 DIAGNOSIS — E1169 Type 2 diabetes mellitus with other specified complication: Secondary | ICD-10-CM | POA: Diagnosis not present

## 2020-04-19 DIAGNOSIS — E119 Type 2 diabetes mellitus without complications: Secondary | ICD-10-CM | POA: Diagnosis not present

## 2020-04-20 DIAGNOSIS — L820 Inflamed seborrheic keratosis: Secondary | ICD-10-CM | POA: Diagnosis not present

## 2020-04-20 DIAGNOSIS — L821 Other seborrheic keratosis: Secondary | ICD-10-CM | POA: Diagnosis not present

## 2020-04-20 DIAGNOSIS — D1801 Hemangioma of skin and subcutaneous tissue: Secondary | ICD-10-CM | POA: Diagnosis not present

## 2020-04-22 DIAGNOSIS — M545 Low back pain: Secondary | ICD-10-CM | POA: Diagnosis not present

## 2020-04-22 DIAGNOSIS — R350 Frequency of micturition: Secondary | ICD-10-CM | POA: Diagnosis not present

## 2020-06-09 DIAGNOSIS — E119 Type 2 diabetes mellitus without complications: Secondary | ICD-10-CM | POA: Diagnosis not present

## 2020-06-09 DIAGNOSIS — H2513 Age-related nuclear cataract, bilateral: Secondary | ICD-10-CM | POA: Diagnosis not present

## 2020-06-16 DIAGNOSIS — R52 Pain, unspecified: Secondary | ICD-10-CM | POA: Diagnosis not present

## 2020-06-16 DIAGNOSIS — R200 Anesthesia of skin: Secondary | ICD-10-CM | POA: Diagnosis not present

## 2020-06-17 DIAGNOSIS — E782 Mixed hyperlipidemia: Secondary | ICD-10-CM | POA: Diagnosis not present

## 2020-06-17 DIAGNOSIS — E119 Type 2 diabetes mellitus without complications: Secondary | ICD-10-CM | POA: Diagnosis not present

## 2020-06-17 DIAGNOSIS — M47812 Spondylosis without myelopathy or radiculopathy, cervical region: Secondary | ICD-10-CM | POA: Diagnosis not present

## 2020-06-17 DIAGNOSIS — I10 Essential (primary) hypertension: Secondary | ICD-10-CM | POA: Diagnosis not present

## 2020-06-17 DIAGNOSIS — E1169 Type 2 diabetes mellitus with other specified complication: Secondary | ICD-10-CM | POA: Diagnosis not present

## 2020-07-06 DIAGNOSIS — M48062 Spinal stenosis, lumbar region with neurogenic claudication: Secondary | ICD-10-CM | POA: Diagnosis not present

## 2020-07-06 DIAGNOSIS — M4316 Spondylolisthesis, lumbar region: Secondary | ICD-10-CM | POA: Diagnosis not present

## 2020-07-13 DIAGNOSIS — H18413 Arcus senilis, bilateral: Secondary | ICD-10-CM | POA: Diagnosis not present

## 2020-07-13 DIAGNOSIS — E119 Type 2 diabetes mellitus without complications: Secondary | ICD-10-CM | POA: Diagnosis not present

## 2020-07-13 DIAGNOSIS — H25013 Cortical age-related cataract, bilateral: Secondary | ICD-10-CM | POA: Diagnosis not present

## 2020-07-13 DIAGNOSIS — H2513 Age-related nuclear cataract, bilateral: Secondary | ICD-10-CM | POA: Diagnosis not present

## 2020-07-13 DIAGNOSIS — H25043 Posterior subcapsular polar age-related cataract, bilateral: Secondary | ICD-10-CM | POA: Diagnosis not present

## 2020-07-13 DIAGNOSIS — H2512 Age-related nuclear cataract, left eye: Secondary | ICD-10-CM | POA: Diagnosis not present

## 2020-07-14 DIAGNOSIS — R200 Anesthesia of skin: Secondary | ICD-10-CM | POA: Diagnosis not present

## 2020-07-14 DIAGNOSIS — G5603 Carpal tunnel syndrome, bilateral upper limbs: Secondary | ICD-10-CM | POA: Diagnosis not present

## 2020-08-03 DIAGNOSIS — G5600 Carpal tunnel syndrome, unspecified upper limb: Secondary | ICD-10-CM | POA: Diagnosis not present

## 2020-08-03 DIAGNOSIS — M48062 Spinal stenosis, lumbar region with neurogenic claudication: Secondary | ICD-10-CM | POA: Diagnosis not present

## 2020-08-11 DIAGNOSIS — E782 Mixed hyperlipidemia: Secondary | ICD-10-CM | POA: Diagnosis not present

## 2020-08-11 DIAGNOSIS — K219 Gastro-esophageal reflux disease without esophagitis: Secondary | ICD-10-CM | POA: Diagnosis not present

## 2020-08-11 DIAGNOSIS — I10 Essential (primary) hypertension: Secondary | ICD-10-CM | POA: Diagnosis not present

## 2020-08-11 DIAGNOSIS — E119 Type 2 diabetes mellitus without complications: Secondary | ICD-10-CM | POA: Diagnosis not present

## 2020-08-11 DIAGNOSIS — M47812 Spondylosis without myelopathy or radiculopathy, cervical region: Secondary | ICD-10-CM | POA: Diagnosis not present

## 2020-08-11 DIAGNOSIS — E1169 Type 2 diabetes mellitus with other specified complication: Secondary | ICD-10-CM | POA: Diagnosis not present

## 2020-08-13 DIAGNOSIS — M19041 Primary osteoarthritis, right hand: Secondary | ICD-10-CM | POA: Diagnosis not present

## 2020-08-13 DIAGNOSIS — G5603 Carpal tunnel syndrome, bilateral upper limbs: Secondary | ICD-10-CM | POA: Diagnosis not present

## 2020-08-13 DIAGNOSIS — R200 Anesthesia of skin: Secondary | ICD-10-CM | POA: Diagnosis not present

## 2020-08-13 DIAGNOSIS — M19042 Primary osteoarthritis, left hand: Secondary | ICD-10-CM | POA: Diagnosis not present

## 2020-08-19 DIAGNOSIS — N760 Acute vaginitis: Secondary | ICD-10-CM | POA: Diagnosis not present

## 2020-08-19 DIAGNOSIS — B372 Candidiasis of skin and nail: Secondary | ICD-10-CM | POA: Diagnosis not present

## 2020-08-23 DIAGNOSIS — H2512 Age-related nuclear cataract, left eye: Secondary | ICD-10-CM | POA: Diagnosis not present

## 2020-08-24 DIAGNOSIS — H25041 Posterior subcapsular polar age-related cataract, right eye: Secondary | ICD-10-CM | POA: Diagnosis not present

## 2020-08-24 DIAGNOSIS — H2511 Age-related nuclear cataract, right eye: Secondary | ICD-10-CM | POA: Diagnosis not present

## 2020-08-24 DIAGNOSIS — H25011 Cortical age-related cataract, right eye: Secondary | ICD-10-CM | POA: Diagnosis not present

## 2020-09-08 DIAGNOSIS — M48062 Spinal stenosis, lumbar region with neurogenic claudication: Secondary | ICD-10-CM | POA: Diagnosis not present

## 2020-09-08 DIAGNOSIS — R399 Unspecified symptoms and signs involving the genitourinary system: Secondary | ICD-10-CM | POA: Diagnosis not present

## 2020-09-08 DIAGNOSIS — R030 Elevated blood-pressure reading, without diagnosis of hypertension: Secondary | ICD-10-CM | POA: Diagnosis not present

## 2020-09-08 DIAGNOSIS — Z6841 Body Mass Index (BMI) 40.0 and over, adult: Secondary | ICD-10-CM | POA: Diagnosis not present

## 2020-09-13 DIAGNOSIS — H2511 Age-related nuclear cataract, right eye: Secondary | ICD-10-CM | POA: Diagnosis not present

## 2020-09-14 DIAGNOSIS — R399 Unspecified symptoms and signs involving the genitourinary system: Secondary | ICD-10-CM | POA: Diagnosis not present

## 2020-09-25 IMAGING — MR MR LUMBAR SPINE W/O CM
4 of 5 series · 26 of 48 positions shown · non-contrast
Comparison: Lumbar radiographs 09/22/2019

CLINICAL DATA: Lumbar stenosis.  Left leg numbness.  Falls.

EXAM:
MRI LUMBAR SPINE WITHOUT CONTRAST
TECHNIQUE: Multiplanar, multisequence MR imaging of the lumbar spine was
performed. No intravenous contrast was administered.

[Series 4: T1 · sagittal · 4.0mm · 0.55mm/px · 5 of 13 slices shown (1 of 2)]
[im 1/13]
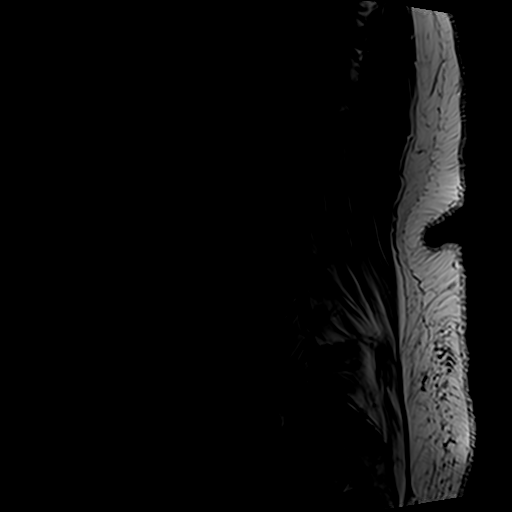
[im 4/13]
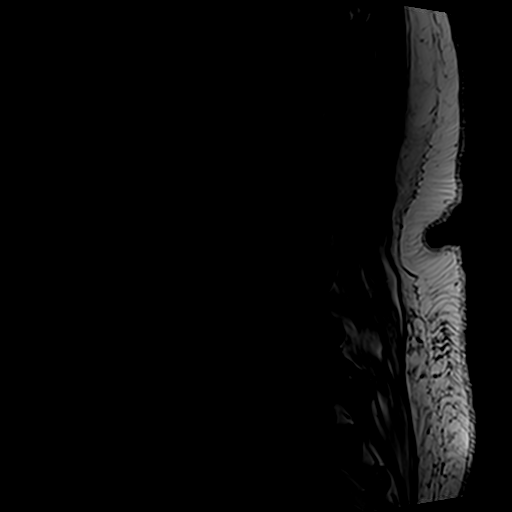
[im 7/13]
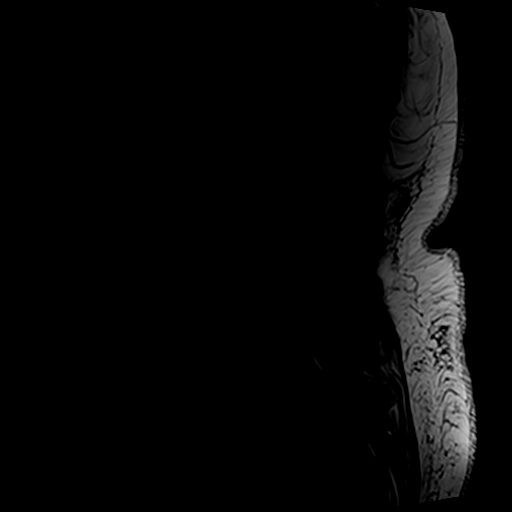
[im 10/13]
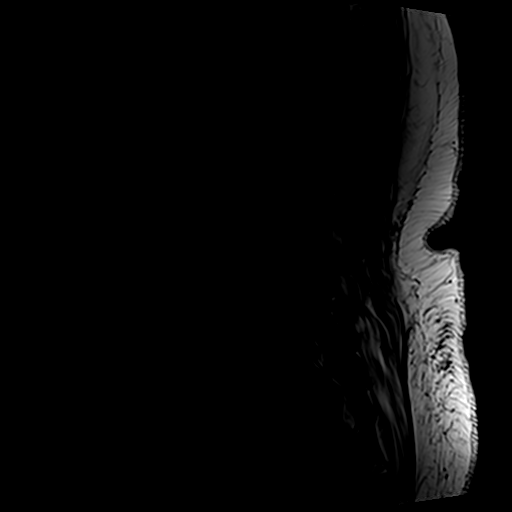
[im 13/13]
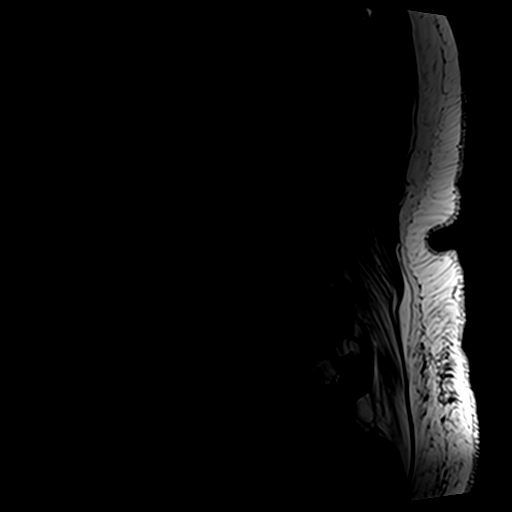

[Series 5: T2 post-contrast · sagittal · 4.0mm · 0.55mm/px · 6 of 13 slices shown]
[im 1/13]
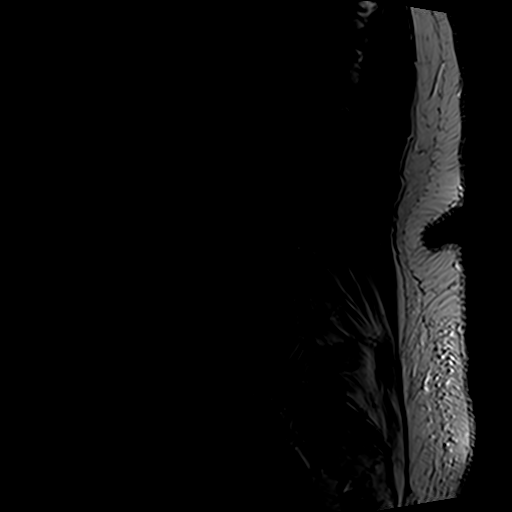
[im 3/13]
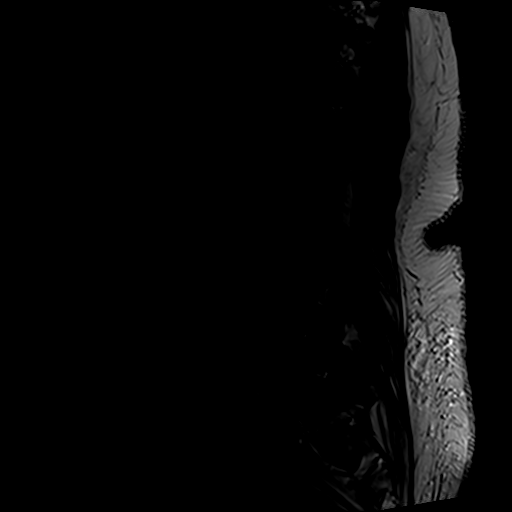
[im 5/13]
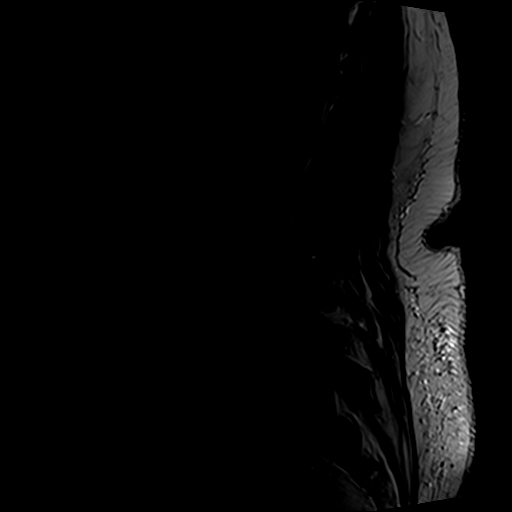
[im 8/13]
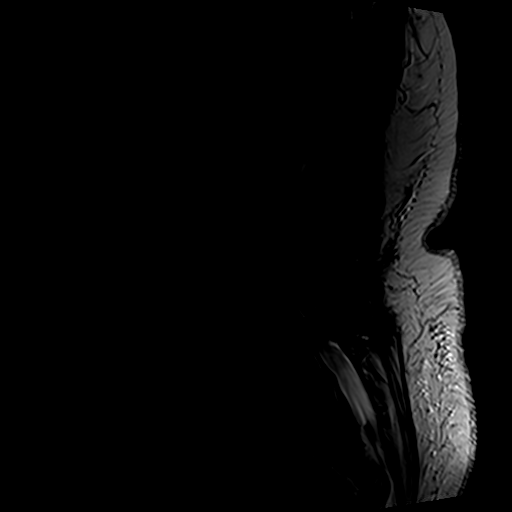
[im 10/13]
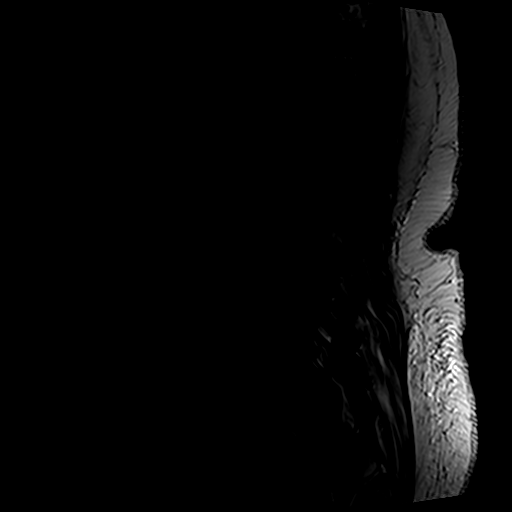
[im 13/13]
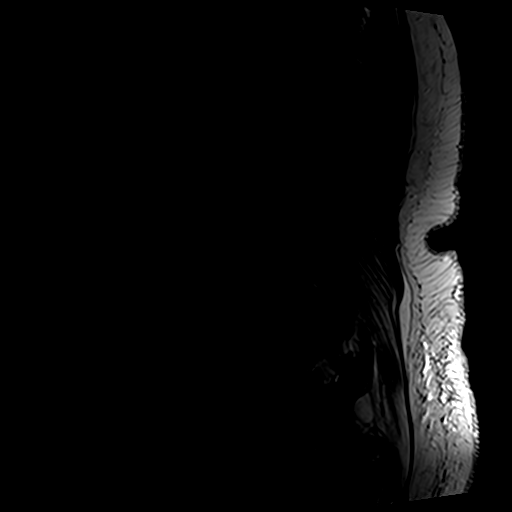

[Series 6: T2 · axial · 4.0mm · 0.70mm/px · z∈[-51,+144]mm · 10 of 36 slices shown]
[im 3/36]
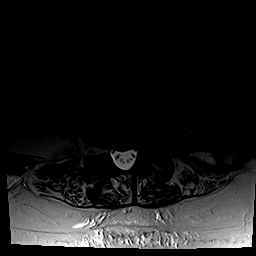
[im 5/36]
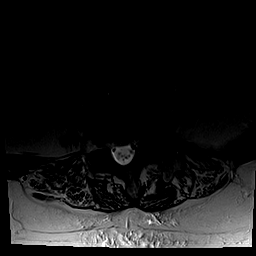
[im 8/36]
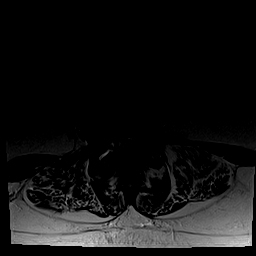
[im 12/36]
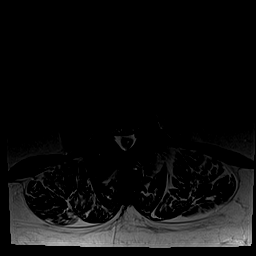
[im 17/36]
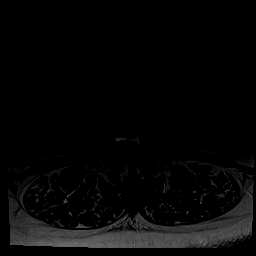
[im 19/36]
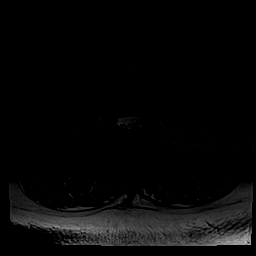
[im 22/36]
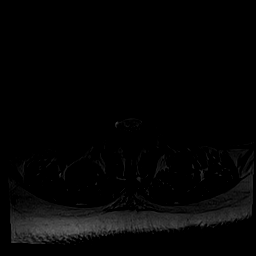
[im 26/36]
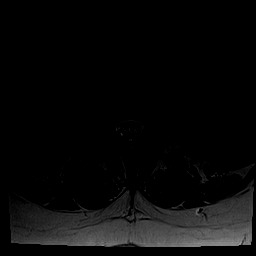
[im 31/36]
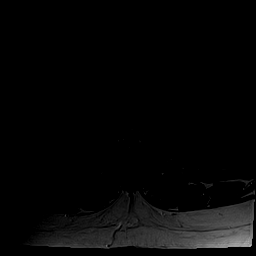
[im 36/36]
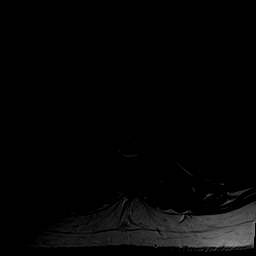

[Series 7: T1 · axial · 4.0mm · 0.35mm/px · z∈[-51,+119]mm · 5 of 36 slices shown (2 of 2)]
[im 3/36]
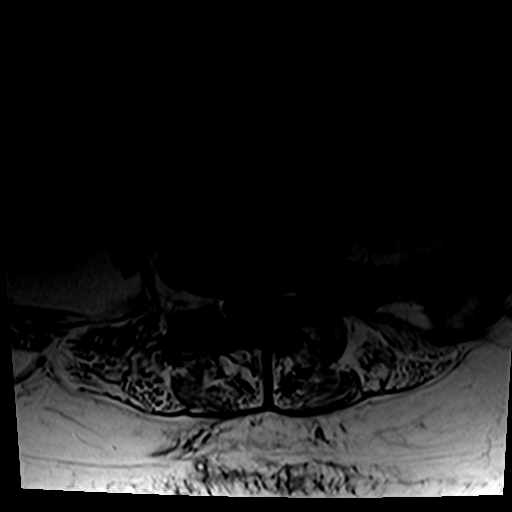
[im 5/36]
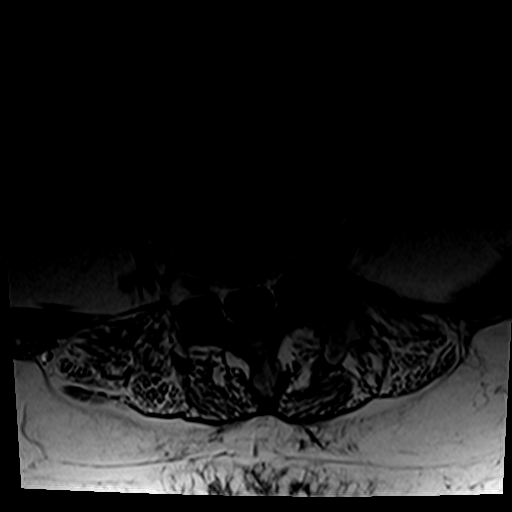
[im 8/36]
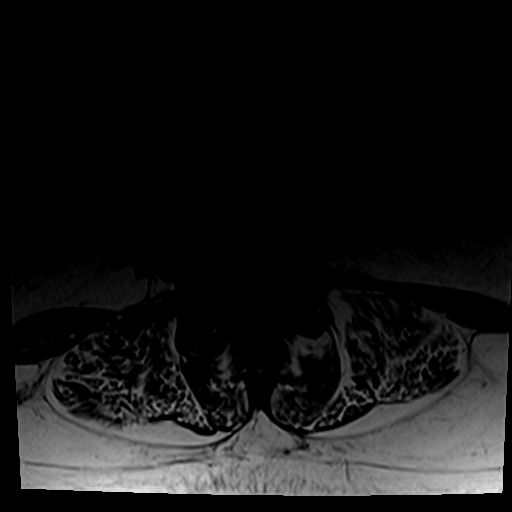
[im 19/36]
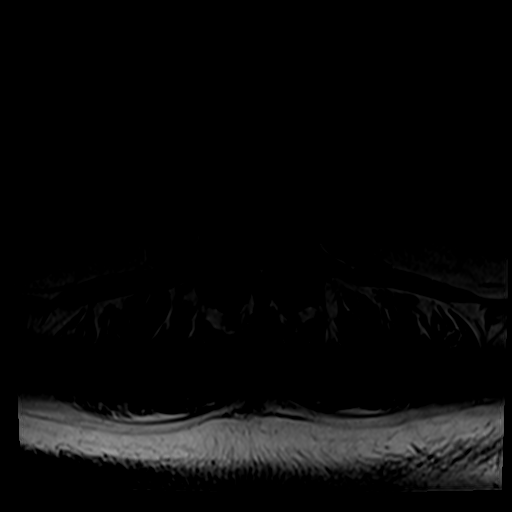
[im 31/36]
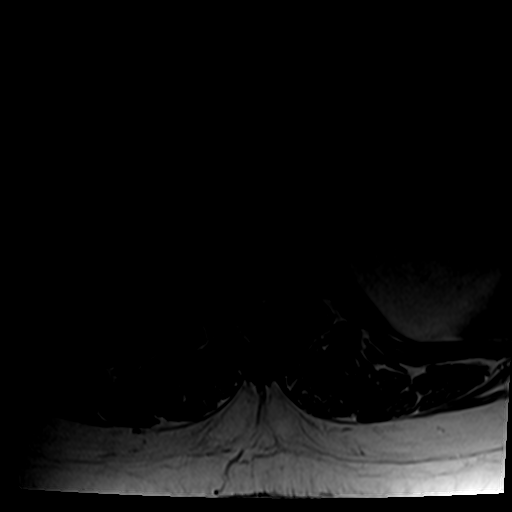

[26 of 48 positions shown; findings below may reference images not displayed]

FINDINGS: Segmentation: Normal. Lowest fully developed disc space L5-S1.
Partially developed disc space at S1-2

Alignment:  3 mm anterolisthesis L3-4

6 mm anterolisthesis L4-5.

3 mm anterolisthesis L5-S1.

Vertebrae: Negative for fracture or mass. Normal bone marrow signal.

Conus medullaris and cauda equina: Conus extends to the L2 level.
Conus and cauda equina appear normal.

Paraspinal and other soft tissues: Negative for paraspinous mass or
adenopathy. No soft tissue edema.

Disc levels:

Disc degeneration and spurring in the lower thoracic spine.

L1-2: Mild disc and facet degeneration without stenosis

L2-3: Mild disc and mild facet degeneration without stenosis.

L3-4: Diffuse bulging of the disc and severe facet degeneration.
Moderate spinal stenosis. Moderate subarticular and foraminal
stenosis bilaterally.

L4-5: Severe spinal stenosis. 6 mm anterolisthesis. Diffuse disc
bulging and severe facet hypertrophy bilaterally. Severe
subarticular stenosis bilaterally.

L5-S1: 3 mm anterolisthesis. Diffuse disc bulging and mild facet
degeneration. Mild subarticular stenosis bilaterally left greater
than right.
IMPRESSION: Multilevel degenerative change throughout the lumbar spine as above.
Negative for fracture

Moderate spinal stenosis L3-4 with moderate subarticular stenosis
bilaterally

Severe spinal and subarticular stenosis bilaterally L4-5.

## 2020-09-28 DIAGNOSIS — R399 Unspecified symptoms and signs involving the genitourinary system: Secondary | ICD-10-CM | POA: Diagnosis not present

## 2020-10-12 DIAGNOSIS — R399 Unspecified symptoms and signs involving the genitourinary system: Secondary | ICD-10-CM | POA: Diagnosis not present

## 2020-10-13 DIAGNOSIS — E1169 Type 2 diabetes mellitus with other specified complication: Secondary | ICD-10-CM | POA: Diagnosis not present

## 2020-10-13 DIAGNOSIS — E782 Mixed hyperlipidemia: Secondary | ICD-10-CM | POA: Diagnosis not present

## 2020-10-13 DIAGNOSIS — I10 Essential (primary) hypertension: Secondary | ICD-10-CM | POA: Diagnosis not present

## 2020-10-18 DIAGNOSIS — R058 Other specified cough: Secondary | ICD-10-CM | POA: Diagnosis not present

## 2020-10-26 DIAGNOSIS — B079 Viral wart, unspecified: Secondary | ICD-10-CM | POA: Diagnosis not present

## 2020-10-26 DIAGNOSIS — L720 Epidermal cyst: Secondary | ICD-10-CM | POA: Diagnosis not present

## 2020-10-26 DIAGNOSIS — D485 Neoplasm of uncertain behavior of skin: Secondary | ICD-10-CM | POA: Diagnosis not present

## 2020-11-09 DIAGNOSIS — R350 Frequency of micturition: Secondary | ICD-10-CM | POA: Diagnosis not present

## 2020-11-09 DIAGNOSIS — N3010 Interstitial cystitis (chronic) without hematuria: Secondary | ICD-10-CM | POA: Diagnosis not present

## 2020-11-15 DIAGNOSIS — M19042 Primary osteoarthritis, left hand: Secondary | ICD-10-CM | POA: Diagnosis not present

## 2020-11-15 DIAGNOSIS — G5603 Carpal tunnel syndrome, bilateral upper limbs: Secondary | ICD-10-CM | POA: Diagnosis not present

## 2020-11-15 DIAGNOSIS — M19041 Primary osteoarthritis, right hand: Secondary | ICD-10-CM | POA: Diagnosis not present

## 2020-12-14 DIAGNOSIS — R197 Diarrhea, unspecified: Secondary | ICD-10-CM | POA: Diagnosis not present

## 2020-12-16 DIAGNOSIS — Z01419 Encounter for gynecological examination (general) (routine) without abnormal findings: Secondary | ICD-10-CM | POA: Diagnosis not present

## 2020-12-16 DIAGNOSIS — Z6841 Body Mass Index (BMI) 40.0 and over, adult: Secondary | ICD-10-CM | POA: Diagnosis not present

## 2020-12-16 DIAGNOSIS — Z1231 Encounter for screening mammogram for malignant neoplasm of breast: Secondary | ICD-10-CM | POA: Diagnosis not present

## 2020-12-28 NOTE — Progress Notes (Unsigned)
Cardiology Office Note   Date:  12/29/2020   ID:  Diane Mayo, DOB 04/11/1951, MRN [id number]  PCP:  Maury Dus, MD    No chief complaint on file.  RF for CAD  Wt Readings from Last 3 Encounters:  12/29/20 220 lb (99.8 kg)  01/20/20 216 lb 3.2 oz (98.1 kg)  07/09/19 218 lb 12.8 oz (99.2 kg)       History of Present Illness: Diane Mayo is a 70 y.o. female   Who has had difficult to control BP. renal US normal 06/02/19- metanephrine normal 07/07/19   She typically does not exercisebecause of her back and leg pain.  Her father had CAD. He had stents placed at age 46.  The patientdoes nothave symptoms concerning for COVID-19 infection (fever, chills, cough, or new shortness of breath).   BP has been in the [id number] range systolic. She did try the valsartan for 3 weeks, but BP did not change.  She had a cough with lisinopril / quinapril in the past.  Initial plan in 06/2019 was: "No benefit from valsartan. She does eat a fair mount of salt. Avoid processed foods. Will start irbesartan. She cannot exercise due to leg problems. Will try to wean clonidine if irbesartan works."  [employer] watchers was recommended.   At f/u, BP was controlled in the 140/80 range.  She had a back injection for back pain.   Denies : Chest pain. Dizziness. Leg edema. Nitroglycerin use. Orthopnea. Palpitations. Paroxysmal nocturnal dyspnea.  Syncope.   Stable DOE which she attributes to her general inactivity.  Past Medical History:  Diagnosis Date  . Genital herpes   . GERD (gastroesophageal reflux disease)   . Hypertension   . Interstitial cystitis   . Leukocytosis   . Osteoarthritis   . Plantar fasciitis     Past Surgical History:  Procedure Laterality Date  . CARPAL TUNNEL RELEASE Bilateral   . CESAREAN SECTION     twice  . SHOULDER SURGERY    . TENDON REPAIR     site not given  . TUBAL LIGATION  1987     Current Outpatient Medications  Medication Sig  Dispense Refill  . allopurinol (ZYLOPRIM) 100 MG tablet Take 1 tablet (100 mg total) by mouth daily. 30 tablet 3  . amLODipine (NORVASC) 10 MG tablet Take 10 mg by mouth daily.    . Black Pepper-Turmeric 3-500 MG CAPS Take 1 tablet by mouth daily.    . celecoxib (CELEBREX) 200 MG capsule Take 1 capsule by mouth daily.    . cetirizine (ZYRTEC) 10 MG tablet Take 10 mg by mouth daily.    . colchicine 0.6 MG tablet TAKE 1 TABLET BY MOUTH TWICE A DAY 30 tablet 0  . irbesartan (AVAPRO) 300 MG tablet Take 1 tablet (300 mg total) by mouth daily. 90 tablet 3  . Melatonin 1 MG TABS Take 1 tablet by mouth at bedtime.    . metFORMIN (GLUCOPHAGE) 1000 MG tablet Take 1,000 mg by mouth 2 (two) times daily with a meal.    . metoprolol tartrate (LOPRESSOR) 25 MG tablet Take 75 mg by mouth 2 (two) times daily.    . naproxen sodium (ANAPROX) 220 MG tablet Take 440 mg by mouth 2 (two) times daily as needed. PAIN    . nystatin cream (MYCOSTATIN) Apply 1 application topically daily as needed for rash.    . omeprazole (PRILOSEC) 40 MG capsule Take 40 mg by mouth daily.    Marland Kitchen  pentosan polysulfate (ELMIRON) 100 MG capsule Take 200 mg by mouth 2 (two) times daily.    . pravastatin (PRAVACHOL) 40 MG tablet Take 40 mg by mouth daily.    Marland Kitchen trimethoprim (TRIMPEX) 100 MG tablet Take 1 tablet by mouth daily.     No current facility-administered medications for this visit.    Allergies:   Lisinopril, Miconazole, and Penicillins    Social History:  The patient  reports that she has never smoked. She has never used smokeless tobacco. She reports that she does not drink alcohol and does not use drugs.   Family History:  The patient's family history includes Alcoholism in her brother; Asthma in her brother; CAD in her father; CVA in her father; Diabetes in her mother; Diabetes type II in her maternal grandfather; Gout in her father; Heart attack in her father; Hypertension in her father and mother; Kidney failure in her father;  Other in her brother and mother; Pneumonia in her mother; Skin cancer in her father.    ROS:  Please see the history of present illness.   Otherwise, review of systems are positive for difficult to lose; back pain.   All other systems are reviewed and negative.    PHYSICAL EXAM: VS:  BP 128/82   Pulse 74   Ht 5' 1.5" (1.562 m)   Wt 220 lb (99.8 kg)   SpO2 98%   BMI 40.90 kg/m  , BMI Body mass index is 40.9 kg/m. GEN: Well nourished, well developed, in no acute distress  HEENT: normal  Neck: no JVD, carotid bruits, or masses Cardiac: RRR; no murmurs, rubs, or gallops,no edema  Respiratory:  clear to auscultation bilaterally, normal work of breathing GI: soft, nontender, nondistended, + BS MS: no deformity or atrophy  Skin: warm and dry, no rash Neuro:  Strength and sensation are intact Psych: euthymic mood, full affect   EKG:   The ekg ordered today demonstrates NSR, prolonged PR interval, LBBB   Recent Labs: 02/03/2020: BUN 11; Creatinine, Ser 0.61; Potassium 4.5; Sodium 141   Lipid Panel No results found for: CHOL, TRIG, HDL, CHOLHDL, VLDL, LDLCALC, LDLDIRECT   Other studies Reviewed: Additional studies/ records that were reviewed today with results demonstrating: labs reviewed.   ASSESSMENT AND PLAN:  1. HTN: The current medical regimen is effective;  continue present plan and medications. 2. DOE: We spoke about trying to increase activity as noted below.  Can do 30 minutes a day in 3, 10 minute intervals.  3. LBBB: chronic. 4. Hyperlipidemia: To be checked in 2/22.  LDL 77 1/21. COntinue pravastatin.  5. DM: A1C 6.4 in Jan 21.  Whole food, plant based diet. 6. Morbid obesity: Limited by back pain.  Try to increase exercise.  Getting some injections.  Can try water aerobics or stationary bike if this is easier on her back.   Current medicines are reviewed at length with the patient today.  The patient concerns regarding her medicines were addressed.  The following  changes have been made:  No change  Labs/ tests ordered today include:  No orders of the defined types were placed in this encounter.   Recommend 150 minutes/week of aerobic exercise Low fat, low carb, high fiber diet recommended  Disposition:   FU in 1 year   Signed, Lance Muss, MD  12/29/2020 12:01 PM    [facility] [address] [place], [place], [place]  [address] Phone: [phone]; Fax: [phone]

## 2020-12-29 ENCOUNTER — Ambulatory Visit: Payer: PPO | Admitting: Interventional Cardiology

## 2020-12-29 ENCOUNTER — Other Ambulatory Visit: Payer: Self-pay

## 2020-12-29 ENCOUNTER — Encounter: Payer: Self-pay | Admitting: Interventional Cardiology

## 2020-12-29 VITALS — BP 128/82 | HR 74 | Ht 61.5 in | Wt 220.0 lb

## 2020-12-29 DIAGNOSIS — E118 Type 2 diabetes mellitus with unspecified complications: Secondary | ICD-10-CM | POA: Diagnosis not present

## 2020-12-29 DIAGNOSIS — I10 Essential (primary) hypertension: Secondary | ICD-10-CM

## 2020-12-29 DIAGNOSIS — E785 Hyperlipidemia, unspecified: Secondary | ICD-10-CM

## 2020-12-29 DIAGNOSIS — I447 Left bundle-branch block, unspecified: Secondary | ICD-10-CM

## 2020-12-29 NOTE — Patient Instructions (Signed)
Medication Instructions:  Your physician recommends that you continue on your current medications as directed. Please refer to the Current Medication list given to you today.  *If you need a refill on your cardiac medications before your next appointment, please call your pharmacy*   Lab Work: None  If you have labs (blood work) drawn today and your tests are completely normal, you will receive your results only by: . MyChart Message (if you have MyChart) OR . A paper copy in the mail If you have any lab test that is abnormal or we need to change your treatment, we will call you to review the results.   Testing/Procedures: None  Follow-Up: At [facility], you and your health needs are our priority.  As part of our continuing mission to provide you with exceptional heart care, we have created designated Provider Care Teams.  These Care Teams include your primary Cardiologist (physician) and Advanced Practice Providers (APPs -  Physician Assistants and Nurse Practitioners) who all work together to provide you with the care you need, when you need it.  We recommend signing up for the patient portal called "MyChart".  Sign up information is provided on this After Visit Summary.  MyChart is used to connect with patients for Virtual Visits (Telemedicine).  Patients are able to view lab/test results, encounter notes, upcoming appointments, etc.  Non-urgent messages can be sent to your provider as well.   To learn more about what you can do with MyChart, go to [email or web address].    Your next appointment:   12 month(s)  The format for your next appointment:   In Person  Provider:   You may see Jayadeep Varanasi, MD or one of the following Advanced Practice Providers on your designated Care Team:    Dayna Dunn, PA-C  Michele Lenze, PA-C    Other Instructions  High-Fiber Diet Fiber, also called dietary fiber, is a type of carbohydrate that is found in fruits, vegetables, whole  grains, and beans. A high-fiber diet can have many health benefits. Your health care provider may recommend a high-fiber diet to help:  Prevent constipation. Fiber can make your bowel movements more regular.  Lower your cholesterol.  Relieve the following conditions: ? Swelling of veins in the anus (hemorrhoids). ? Swelling and irritation (inflammation) of specific areas of the digestive tract (uncomplicated diverticulosis). ? A problem of the large intestine (colon) that sometimes causes pain and diarrhea (irritable bowel syndrome, IBS).  Prevent overeating as part of a weight-loss plan.  Prevent heart disease, type 2 diabetes, and certain cancers. What is my plan? The recommended daily fiber intake in grams (g) includes:  38 g for men age 50 or younger.  30 g for men over age 50.  25 g for women age 50 or younger.  21 g for women over age 50. You can get the recommended daily intake of dietary fiber by:  Eating a variety of fruits, vegetables, grains, and beans.  Taking a fiber supplement, if it is not possible to get enough fiber through your diet. What do I need to know about a high-fiber diet?  It is better to get fiber through food sources rather than from fiber supplements. There is not a lot of research about how effective supplements are.  Always check the fiber content on the nutrition facts label of any prepackaged food. Look for foods that contain 5 g of fiber or more per serving.  Talk with a diet and nutrition specialist (dietitian) if you   have questions about specific foods that are recommended or not recommended for your medical condition, especially if those foods are not listed below.  Gradually increase how much fiber you consume. If you increase your intake of dietary fiber too quickly, you may have bloating, cramping, or gas.  Drink plenty of water. Water helps you to digest fiber. What are tips for following this plan?  Eat a wide variety of high-fiber  foods.  Make sure that half of the grains that you eat each day are whole grains.  Eat breads and cereals that are made with whole-grain flour instead of refined flour or white flour.  Eat brown rice, bulgur wheat, or millet instead of white rice.  Start the day with a breakfast that is high in fiber, such as a cereal that contains 5 g of fiber or more per serving.  Use beans in place of meat in soups, salads, and pasta dishes.  Eat high-fiber snacks, such as berries, raw vegetables, nuts, and popcorn.  Choose whole fruits and vegetables instead of processed forms like juice or sauce. What foods can I eat?  Fruits Berries. Pears. Apples. Oranges. Avocado. Prunes and raisins. Dried figs. Vegetables Sweet potatoes. Spinach. Kale. Artichokes. Cabbage. Broccoli. Cauliflower. Green peas. Carrots. Squash. Grains Whole-grain breads. Multigrain cereal. Oats and oatmeal. Brown rice. Barley. Bulgur wheat. Millet. Quinoa. Bran muffins. Popcorn. Rye wafer crackers. Meats and other proteins Navy, kidney, and pinto beans. Soybeans. Split peas. Lentils. Nuts and seeds. Dairy Fiber-fortified yogurt. Beverages Fiber-fortified soy milk. Fiber-fortified orange juice. Other foods Fiber bars. The items listed above may not be a complete list of recommended foods and beverages. Contact a dietitian for more options. What foods are not recommended? Fruits Fruit juice. Cooked, strained fruit. Vegetables Fried potatoes. Canned vegetables. Well-cooked vegetables. Grains White bread. Pasta made with refined flour. White rice. Meats and other proteins Fatty cuts of meat. Fried chicken or fried fish. Dairy Milk. Yogurt. Cream cheese. Sour cream. Fats and oils Butters. Beverages Soft drinks. Other foods Cakes and pastries. The items listed above may not be a complete list of foods and beverages to avoid. Contact a dietitian for more information. Summary  Fiber is a type of carbohydrate. It is  found in fruits, vegetables, whole grains, and beans.  There are many health benefits of eating a high-fiber diet, such as preventing constipation, lowering blood cholesterol, helping with weight loss, and reducing your risk of heart disease, diabetes, and certain cancers.  Gradually increase your intake of fiber. Increasing too fast can result in cramping, bloating, and gas. Drink plenty of water while you increase your fiber.  The best sources of fiber include whole fruits and vegetables, whole grains, nuts, seeds, and beans. This information is not intended to replace advice given to you by your health care provider. Make sure you discuss any questions you have with your health care provider. Document Revised: 10/15/2017 Document Reviewed: 10/15/2017 Elsevier Patient Education  2020 Elsevier Inc.   

## 2021-01-17 ENCOUNTER — Other Ambulatory Visit: Payer: Self-pay | Admitting: Interventional Cardiology

## 2021-01-24 DIAGNOSIS — D72829 Elevated white blood cell count, unspecified: Secondary | ICD-10-CM | POA: Diagnosis not present

## 2021-01-24 DIAGNOSIS — M545 Low back pain, unspecified: Secondary | ICD-10-CM | POA: Diagnosis not present

## 2021-01-24 DIAGNOSIS — M1000 Idiopathic gout, unspecified site: Secondary | ICD-10-CM | POA: Diagnosis not present

## 2021-01-24 DIAGNOSIS — Z0000 Encounter for general adult medical examination without abnormal findings: Secondary | ICD-10-CM | POA: Diagnosis not present

## 2021-01-24 DIAGNOSIS — N3010 Interstitial cystitis (chronic) without hematuria: Secondary | ICD-10-CM | POA: Diagnosis not present

## 2021-01-24 DIAGNOSIS — E782 Mixed hyperlipidemia: Secondary | ICD-10-CM | POA: Diagnosis not present

## 2021-01-24 DIAGNOSIS — E1169 Type 2 diabetes mellitus with other specified complication: Secondary | ICD-10-CM | POA: Diagnosis not present

## 2021-01-24 DIAGNOSIS — Z6841 Body Mass Index (BMI) 40.0 and over, adult: Secondary | ICD-10-CM | POA: Diagnosis not present

## 2021-01-24 DIAGNOSIS — Z1389 Encounter for screening for other disorder: Secondary | ICD-10-CM | POA: Diagnosis not present

## 2021-01-24 DIAGNOSIS — I10 Essential (primary) hypertension: Secondary | ICD-10-CM | POA: Diagnosis not present

## 2021-01-24 DIAGNOSIS — K219 Gastro-esophageal reflux disease without esophagitis: Secondary | ICD-10-CM | POA: Diagnosis not present

## 2021-02-21 DIAGNOSIS — M47812 Spondylosis without myelopathy or radiculopathy, cervical region: Secondary | ICD-10-CM | POA: Diagnosis not present

## 2021-02-21 DIAGNOSIS — E1169 Type 2 diabetes mellitus with other specified complication: Secondary | ICD-10-CM | POA: Diagnosis not present

## 2021-02-21 DIAGNOSIS — I10 Essential (primary) hypertension: Secondary | ICD-10-CM | POA: Diagnosis not present

## 2021-02-21 DIAGNOSIS — E782 Mixed hyperlipidemia: Secondary | ICD-10-CM | POA: Diagnosis not present

## 2021-02-21 DIAGNOSIS — K219 Gastro-esophageal reflux disease without esophagitis: Secondary | ICD-10-CM | POA: Diagnosis not present

## 2021-02-21 DIAGNOSIS — E119 Type 2 diabetes mellitus without complications: Secondary | ICD-10-CM | POA: Diagnosis not present

## 2021-02-22 DIAGNOSIS — N3010 Interstitial cystitis (chronic) without hematuria: Secondary | ICD-10-CM | POA: Diagnosis not present

## 2021-02-22 DIAGNOSIS — R399 Unspecified symptoms and signs involving the genitourinary system: Secondary | ICD-10-CM | POA: Diagnosis not present

## 2021-03-08 DIAGNOSIS — M4316 Spondylolisthesis, lumbar region: Secondary | ICD-10-CM | POA: Diagnosis not present

## 2021-04-04 DIAGNOSIS — H43813 Vitreous degeneration, bilateral: Secondary | ICD-10-CM | POA: Diagnosis not present

## 2021-04-04 DIAGNOSIS — E119 Type 2 diabetes mellitus without complications: Secondary | ICD-10-CM | POA: Diagnosis not present

## 2021-04-04 DIAGNOSIS — Z79899 Other long term (current) drug therapy: Secondary | ICD-10-CM | POA: Diagnosis not present

## 2021-04-04 DIAGNOSIS — Z961 Presence of intraocular lens: Secondary | ICD-10-CM | POA: Diagnosis not present

## 2021-05-03 DIAGNOSIS — D225 Melanocytic nevi of trunk: Secondary | ICD-10-CM | POA: Diagnosis not present

## 2021-05-03 DIAGNOSIS — L814 Other melanin hyperpigmentation: Secondary | ICD-10-CM | POA: Diagnosis not present

## 2021-05-03 DIAGNOSIS — L821 Other seborrheic keratosis: Secondary | ICD-10-CM | POA: Diagnosis not present

## 2021-05-11 DIAGNOSIS — Z6841 Body Mass Index (BMI) 40.0 and over, adult: Secondary | ICD-10-CM | POA: Diagnosis not present

## 2021-05-11 DIAGNOSIS — I10 Essential (primary) hypertension: Secondary | ICD-10-CM | POA: Diagnosis not present

## 2021-05-11 DIAGNOSIS — M48062 Spinal stenosis, lumbar region with neurogenic claudication: Secondary | ICD-10-CM | POA: Diagnosis not present

## 2021-05-13 DIAGNOSIS — E782 Mixed hyperlipidemia: Secondary | ICD-10-CM | POA: Diagnosis not present

## 2021-05-13 DIAGNOSIS — E119 Type 2 diabetes mellitus without complications: Secondary | ICD-10-CM | POA: Diagnosis not present

## 2021-05-13 DIAGNOSIS — K219 Gastro-esophageal reflux disease without esophagitis: Secondary | ICD-10-CM | POA: Diagnosis not present

## 2021-05-13 DIAGNOSIS — E1169 Type 2 diabetes mellitus with other specified complication: Secondary | ICD-10-CM | POA: Diagnosis not present

## 2021-05-13 DIAGNOSIS — M47812 Spondylosis without myelopathy or radiculopathy, cervical region: Secondary | ICD-10-CM | POA: Diagnosis not present

## 2021-05-13 DIAGNOSIS — I10 Essential (primary) hypertension: Secondary | ICD-10-CM | POA: Diagnosis not present

## 2021-05-16 DIAGNOSIS — G5603 Carpal tunnel syndrome, bilateral upper limbs: Secondary | ICD-10-CM | POA: Diagnosis not present

## 2021-05-16 DIAGNOSIS — M19042 Primary osteoarthritis, left hand: Secondary | ICD-10-CM | POA: Diagnosis not present

## 2021-05-16 DIAGNOSIS — M65341 Trigger finger, right ring finger: Secondary | ICD-10-CM | POA: Diagnosis not present

## 2021-05-16 DIAGNOSIS — M19041 Primary osteoarthritis, right hand: Secondary | ICD-10-CM | POA: Diagnosis not present

## 2021-05-26 DIAGNOSIS — R399 Unspecified symptoms and signs involving the genitourinary system: Secondary | ICD-10-CM | POA: Diagnosis not present

## 2021-06-20 DIAGNOSIS — M65341 Trigger finger, right ring finger: Secondary | ICD-10-CM | POA: Diagnosis not present

## 2021-06-20 DIAGNOSIS — G5601 Carpal tunnel syndrome, right upper limb: Secondary | ICD-10-CM | POA: Diagnosis not present

## 2021-07-06 DIAGNOSIS — E119 Type 2 diabetes mellitus without complications: Secondary | ICD-10-CM | POA: Diagnosis not present

## 2021-07-06 DIAGNOSIS — I10 Essential (primary) hypertension: Secondary | ICD-10-CM | POA: Diagnosis not present

## 2021-07-06 DIAGNOSIS — K219 Gastro-esophageal reflux disease without esophagitis: Secondary | ICD-10-CM | POA: Diagnosis not present

## 2021-07-06 DIAGNOSIS — E1169 Type 2 diabetes mellitus with other specified complication: Secondary | ICD-10-CM | POA: Diagnosis not present

## 2021-07-06 DIAGNOSIS — M47812 Spondylosis without myelopathy or radiculopathy, cervical region: Secondary | ICD-10-CM | POA: Diagnosis not present

## 2021-07-06 DIAGNOSIS — E782 Mixed hyperlipidemia: Secondary | ICD-10-CM | POA: Diagnosis not present

## 2021-07-18 DIAGNOSIS — R399 Unspecified symptoms and signs involving the genitourinary system: Secondary | ICD-10-CM | POA: Diagnosis not present

## 2021-07-29 DIAGNOSIS — M1000 Idiopathic gout, unspecified site: Secondary | ICD-10-CM | POA: Diagnosis not present

## 2021-07-29 DIAGNOSIS — D72829 Elevated white blood cell count, unspecified: Secondary | ICD-10-CM | POA: Diagnosis not present

## 2021-07-29 DIAGNOSIS — M47812 Spondylosis without myelopathy or radiculopathy, cervical region: Secondary | ICD-10-CM | POA: Diagnosis not present

## 2021-07-29 DIAGNOSIS — E1169 Type 2 diabetes mellitus with other specified complication: Secondary | ICD-10-CM | POA: Diagnosis not present

## 2021-07-29 DIAGNOSIS — E782 Mixed hyperlipidemia: Secondary | ICD-10-CM | POA: Diagnosis not present

## 2021-07-29 DIAGNOSIS — I10 Essential (primary) hypertension: Secondary | ICD-10-CM | POA: Diagnosis not present

## 2021-07-29 DIAGNOSIS — K219 Gastro-esophageal reflux disease without esophagitis: Secondary | ICD-10-CM | POA: Diagnosis not present

## 2021-08-12 DIAGNOSIS — E119 Type 2 diabetes mellitus without complications: Secondary | ICD-10-CM | POA: Diagnosis not present

## 2021-08-12 DIAGNOSIS — I10 Essential (primary) hypertension: Secondary | ICD-10-CM | POA: Diagnosis not present

## 2021-08-12 DIAGNOSIS — K219 Gastro-esophageal reflux disease without esophagitis: Secondary | ICD-10-CM | POA: Diagnosis not present

## 2021-08-12 DIAGNOSIS — M47812 Spondylosis without myelopathy or radiculopathy, cervical region: Secondary | ICD-10-CM | POA: Diagnosis not present

## 2021-08-12 DIAGNOSIS — E782 Mixed hyperlipidemia: Secondary | ICD-10-CM | POA: Diagnosis not present

## 2021-08-12 DIAGNOSIS — E1169 Type 2 diabetes mellitus with other specified complication: Secondary | ICD-10-CM | POA: Diagnosis not present

## 2021-09-19 DIAGNOSIS — M25642 Stiffness of left hand, not elsewhere classified: Secondary | ICD-10-CM | POA: Diagnosis not present

## 2021-09-19 DIAGNOSIS — M79641 Pain in right hand: Secondary | ICD-10-CM | POA: Diagnosis not present

## 2021-09-19 DIAGNOSIS — G5601 Carpal tunnel syndrome, right upper limb: Secondary | ICD-10-CM | POA: Diagnosis not present

## 2021-09-19 DIAGNOSIS — M25641 Stiffness of right hand, not elsewhere classified: Secondary | ICD-10-CM | POA: Diagnosis not present

## 2021-09-19 DIAGNOSIS — M65341 Trigger finger, right ring finger: Secondary | ICD-10-CM | POA: Diagnosis not present

## 2021-09-19 DIAGNOSIS — M79642 Pain in left hand: Secondary | ICD-10-CM | POA: Diagnosis not present

## 2021-10-03 DIAGNOSIS — E119 Type 2 diabetes mellitus without complications: Secondary | ICD-10-CM | POA: Diagnosis not present

## 2021-10-03 DIAGNOSIS — E782 Mixed hyperlipidemia: Secondary | ICD-10-CM | POA: Diagnosis not present

## 2021-10-03 DIAGNOSIS — M47812 Spondylosis without myelopathy or radiculopathy, cervical region: Secondary | ICD-10-CM | POA: Diagnosis not present

## 2021-10-03 DIAGNOSIS — I10 Essential (primary) hypertension: Secondary | ICD-10-CM | POA: Diagnosis not present

## 2021-10-03 DIAGNOSIS — E1169 Type 2 diabetes mellitus with other specified complication: Secondary | ICD-10-CM | POA: Diagnosis not present

## 2021-10-03 DIAGNOSIS — K219 Gastro-esophageal reflux disease without esophagitis: Secondary | ICD-10-CM | POA: Diagnosis not present

## 2021-10-06 DIAGNOSIS — M79642 Pain in left hand: Secondary | ICD-10-CM | POA: Diagnosis not present

## 2021-10-06 DIAGNOSIS — M25641 Stiffness of right hand, not elsewhere classified: Secondary | ICD-10-CM | POA: Diagnosis not present

## 2021-10-06 DIAGNOSIS — M79641 Pain in right hand: Secondary | ICD-10-CM | POA: Diagnosis not present

## 2021-10-06 DIAGNOSIS — M25642 Stiffness of left hand, not elsewhere classified: Secondary | ICD-10-CM | POA: Diagnosis not present

## 2021-10-18 DIAGNOSIS — M48062 Spinal stenosis, lumbar region with neurogenic claudication: Secondary | ICD-10-CM | POA: Diagnosis not present

## 2021-10-21 ENCOUNTER — Other Ambulatory Visit: Payer: Self-pay | Admitting: Neurosurgery

## 2021-10-21 DIAGNOSIS — M48062 Spinal stenosis, lumbar region with neurogenic claudication: Secondary | ICD-10-CM

## 2021-10-25 DIAGNOSIS — M25641 Stiffness of right hand, not elsewhere classified: Secondary | ICD-10-CM | POA: Diagnosis not present

## 2021-10-25 DIAGNOSIS — M79642 Pain in left hand: Secondary | ICD-10-CM | POA: Diagnosis not present

## 2021-10-25 DIAGNOSIS — M79641 Pain in right hand: Secondary | ICD-10-CM | POA: Diagnosis not present

## 2021-10-25 DIAGNOSIS — M25642 Stiffness of left hand, not elsewhere classified: Secondary | ICD-10-CM | POA: Diagnosis not present

## 2021-11-02 DIAGNOSIS — M25641 Stiffness of right hand, not elsewhere classified: Secondary | ICD-10-CM | POA: Diagnosis not present

## 2021-11-02 DIAGNOSIS — M79641 Pain in right hand: Secondary | ICD-10-CM | POA: Diagnosis not present

## 2021-11-02 DIAGNOSIS — M25642 Stiffness of left hand, not elsewhere classified: Secondary | ICD-10-CM | POA: Diagnosis not present

## 2021-11-02 DIAGNOSIS — M79642 Pain in left hand: Secondary | ICD-10-CM | POA: Diagnosis not present

## 2021-11-03 DIAGNOSIS — Z23 Encounter for immunization: Secondary | ICD-10-CM | POA: Diagnosis not present

## 2021-11-08 ENCOUNTER — Other Ambulatory Visit: Payer: Self-pay

## 2021-11-08 ENCOUNTER — Ambulatory Visit
Admission: RE | Admit: 2021-11-08 | Discharge: 2021-11-08 | Disposition: A | Discharge status: Home / Self Care | Payer: PPO | Source: Ambulatory Visit | Attending: Neurosurgery | Admitting: Neurosurgery

## 2021-11-08 DIAGNOSIS — M48062 Spinal stenosis, lumbar region with neurogenic claudication: Secondary | ICD-10-CM

## 2021-11-09 DIAGNOSIS — I10 Essential (primary) hypertension: Secondary | ICD-10-CM | POA: Diagnosis not present

## 2021-11-09 DIAGNOSIS — M48061 Spinal stenosis, lumbar region without neurogenic claudication: Secondary | ICD-10-CM | POA: Diagnosis not present

## 2021-11-09 DIAGNOSIS — E119 Type 2 diabetes mellitus without complications: Secondary | ICD-10-CM | POA: Diagnosis not present

## 2021-11-09 DIAGNOSIS — E1169 Type 2 diabetes mellitus with other specified complication: Secondary | ICD-10-CM | POA: Diagnosis not present

## 2021-11-09 DIAGNOSIS — M47812 Spondylosis without myelopathy or radiculopathy, cervical region: Secondary | ICD-10-CM | POA: Diagnosis not present

## 2021-11-09 DIAGNOSIS — K219 Gastro-esophageal reflux disease without esophagitis: Secondary | ICD-10-CM | POA: Diagnosis not present

## 2021-11-09 DIAGNOSIS — M545 Low back pain, unspecified: Secondary | ICD-10-CM | POA: Diagnosis not present

## 2021-11-09 DIAGNOSIS — E782 Mixed hyperlipidemia: Secondary | ICD-10-CM | POA: Diagnosis not present

## 2021-11-09 DIAGNOSIS — M47816 Spondylosis without myelopathy or radiculopathy, lumbar region: Secondary | ICD-10-CM | POA: Diagnosis not present

## 2021-11-10 DIAGNOSIS — M25641 Stiffness of right hand, not elsewhere classified: Secondary | ICD-10-CM | POA: Diagnosis not present

## 2021-11-10 DIAGNOSIS — M79642 Pain in left hand: Secondary | ICD-10-CM | POA: Diagnosis not present

## 2021-11-10 DIAGNOSIS — M25642 Stiffness of left hand, not elsewhere classified: Secondary | ICD-10-CM | POA: Diagnosis not present

## 2021-11-10 DIAGNOSIS — M79641 Pain in right hand: Secondary | ICD-10-CM | POA: Diagnosis not present

## 2021-11-14 DIAGNOSIS — M65341 Trigger finger, right ring finger: Secondary | ICD-10-CM | POA: Diagnosis not present

## 2021-11-15 DIAGNOSIS — M4316 Spondylolisthesis, lumbar region: Secondary | ICD-10-CM | POA: Diagnosis not present

## 2021-11-16 DIAGNOSIS — M48062 Spinal stenosis, lumbar region with neurogenic claudication: Secondary | ICD-10-CM | POA: Diagnosis not present

## 2021-11-24 DIAGNOSIS — M79642 Pain in left hand: Secondary | ICD-10-CM | POA: Diagnosis not present

## 2021-11-24 DIAGNOSIS — M65341 Trigger finger, right ring finger: Secondary | ICD-10-CM | POA: Diagnosis not present

## 2021-11-24 DIAGNOSIS — M25641 Stiffness of right hand, not elsewhere classified: Secondary | ICD-10-CM | POA: Diagnosis not present

## 2021-11-24 DIAGNOSIS — M79641 Pain in right hand: Secondary | ICD-10-CM | POA: Diagnosis not present

## 2021-11-28 DIAGNOSIS — M47812 Spondylosis without myelopathy or radiculopathy, cervical region: Secondary | ICD-10-CM | POA: Diagnosis not present

## 2021-11-28 DIAGNOSIS — E782 Mixed hyperlipidemia: Secondary | ICD-10-CM | POA: Diagnosis not present

## 2021-11-28 DIAGNOSIS — I10 Essential (primary) hypertension: Secondary | ICD-10-CM | POA: Diagnosis not present

## 2021-11-28 DIAGNOSIS — K219 Gastro-esophageal reflux disease without esophagitis: Secondary | ICD-10-CM | POA: Diagnosis not present

## 2021-11-28 DIAGNOSIS — E1169 Type 2 diabetes mellitus with other specified complication: Secondary | ICD-10-CM | POA: Diagnosis not present

## 2021-11-28 DIAGNOSIS — E119 Type 2 diabetes mellitus without complications: Secondary | ICD-10-CM | POA: Diagnosis not present

## 2021-11-30 DIAGNOSIS — M25641 Stiffness of right hand, not elsewhere classified: Secondary | ICD-10-CM | POA: Diagnosis not present

## 2021-11-30 DIAGNOSIS — M65341 Trigger finger, right ring finger: Secondary | ICD-10-CM | POA: Diagnosis not present

## 2021-11-30 DIAGNOSIS — M79642 Pain in left hand: Secondary | ICD-10-CM | POA: Diagnosis not present

## 2021-11-30 DIAGNOSIS — M79641 Pain in right hand: Secondary | ICD-10-CM | POA: Diagnosis not present

## 2021-12-08 DIAGNOSIS — M79642 Pain in left hand: Secondary | ICD-10-CM | POA: Diagnosis not present

## 2021-12-08 DIAGNOSIS — M25641 Stiffness of right hand, not elsewhere classified: Secondary | ICD-10-CM | POA: Diagnosis not present

## 2021-12-08 DIAGNOSIS — M79641 Pain in right hand: Secondary | ICD-10-CM | POA: Diagnosis not present

## 2021-12-08 DIAGNOSIS — M65341 Trigger finger, right ring finger: Secondary | ICD-10-CM | POA: Diagnosis not present

## 2021-12-08 DIAGNOSIS — M25642 Stiffness of left hand, not elsewhere classified: Secondary | ICD-10-CM | POA: Diagnosis not present

## 2021-12-12 DIAGNOSIS — M65341 Trigger finger, right ring finger: Secondary | ICD-10-CM | POA: Diagnosis not present

## 2021-12-12 DIAGNOSIS — M25642 Stiffness of left hand, not elsewhere classified: Secondary | ICD-10-CM | POA: Diagnosis not present

## 2021-12-12 DIAGNOSIS — M25641 Stiffness of right hand, not elsewhere classified: Secondary | ICD-10-CM | POA: Diagnosis not present

## 2022-01-17 DIAGNOSIS — I10 Essential (primary) hypertension: Secondary | ICD-10-CM | POA: Diagnosis not present

## 2022-01-17 DIAGNOSIS — Z6841 Body Mass Index (BMI) 40.0 and over, adult: Secondary | ICD-10-CM | POA: Diagnosis not present

## 2022-01-17 DIAGNOSIS — M4316 Spondylolisthesis, lumbar region: Secondary | ICD-10-CM | POA: Diagnosis not present

## 2022-01-19 ENCOUNTER — Other Ambulatory Visit: Payer: Self-pay | Admitting: Neurosurgery

## 2022-01-24 DIAGNOSIS — Z01419 Encounter for gynecological examination (general) (routine) without abnormal findings: Secondary | ICD-10-CM | POA: Diagnosis not present

## 2022-01-24 DIAGNOSIS — Z1231 Encounter for screening mammogram for malignant neoplasm of breast: Secondary | ICD-10-CM | POA: Diagnosis not present

## 2022-01-24 DIAGNOSIS — Z6841 Body Mass Index (BMI) 40.0 and over, adult: Secondary | ICD-10-CM | POA: Diagnosis not present

## 2022-01-25 ENCOUNTER — Telehealth: Payer: Self-pay | Admitting: *Deleted

## 2022-01-25 NOTE — Telephone Encounter (Signed)
° °  Name: Diane Mayo  DOB: Sep 23, 1951  MRN: [id number]  Primary Cardiologist: Larae Grooms, MD  Chart reviewed as part of pre-operative protocol coverage. Because of Diane Mayo's past medical history and time since last visit, she will require a follow-up visit in order to better assess preoperative cardiovascular risk.  Pre-op covering staff: - Please schedule appointment and call patient to inform them. If patient already had an upcoming appointment within acceptable timeframe, please add "pre-op clearance" to the appointment notes so provider is aware. - Please contact requesting surgeon's office via preferred method (i.e, phone, fax) to inform them of need for appointment prior to surgery.  If applicable, this message will also be routed to pharmacy pool and/or primary cardiologist for input on holding anticoagulant/antiplatelet agent as requested below so that this information is available to the clearing provider at time of patient's appointment.   [place], [place]  01/25/2022, 3:13 PM

## 2022-01-25 NOTE — Telephone Encounter (Signed)
I s/w the pt and she has been scheduled to see Dr. Irish Lack 01/26/22 @ 9:40 for pre op clearance. Pt is agreeable to plan of care. I will forward clearance notes to MD for upcoming appt. Will send FYI to requesting office the pt has appt 01/26/22.

## 2022-01-25 NOTE — Telephone Encounter (Signed)
° °  Pre-operative Risk Assessment    Patient Name: Diane Mayo  DOB: 01-27-1951 MRN: [id number]      Request for Surgical Clearance    Procedure:   L3-4, L4-5 LUMBAR FUSION  Date of Surgery:  Clearance 02/01/22                                 Surgeon:  DR. Kary Kos Surgeon's Group or Practice Name:  [facility] Phone number:  [phone] Fax number:  [phone] ATTN: VANESSA  ext [phone]   Type of Clearance Requested:   - Medical    Type of Anesthesia:  General    Additional requests/questions:    [place], [place]   01/25/2022, 9:22 AM

## 2022-01-26 ENCOUNTER — Other Ambulatory Visit: Payer: Self-pay

## 2022-01-26 ENCOUNTER — Ambulatory Visit: Payer: PPO | Admitting: Interventional Cardiology

## 2022-01-26 ENCOUNTER — Encounter: Payer: Self-pay | Admitting: Interventional Cardiology

## 2022-01-26 VITALS — BP 130/64 | HR 64 | Ht 61.5 in | Wt 220.0 lb

## 2022-01-26 DIAGNOSIS — I447 Left bundle-branch block, unspecified: Secondary | ICD-10-CM

## 2022-01-26 DIAGNOSIS — Z01810 Encounter for preprocedural cardiovascular examination: Secondary | ICD-10-CM | POA: Diagnosis not present

## 2022-01-26 DIAGNOSIS — E118 Type 2 diabetes mellitus with unspecified complications: Secondary | ICD-10-CM | POA: Diagnosis not present

## 2022-01-26 DIAGNOSIS — I10 Essential (primary) hypertension: Secondary | ICD-10-CM | POA: Diagnosis not present

## 2022-01-26 DIAGNOSIS — E782 Mixed hyperlipidemia: Secondary | ICD-10-CM | POA: Diagnosis not present

## 2022-01-26 NOTE — Patient Instructions (Signed)
Medication Instructions:  No changes *If you need a refill on your cardiac medications before your next appointment, please call your pharmacy*   Lab Work: none   Testing/Procedures: none   Follow-Up: At [facility], you and your health needs are our priority.  As part of our continuing mission to provide you with exceptional heart care, we have created designated Provider Care Teams.  These Care Teams include your primary Cardiologist (physician) and Advanced Practice Providers (APPs -  Physician Assistants and Nurse Practitioners) who all work together to provide you with the care you need, when you need it.   Your next appointment:   12 month(s)  The format for your next appointment:   In Person  Provider:   Larae Grooms, MD

## 2022-01-26 NOTE — Progress Notes (Signed)
Cardiology Office Note   Date:  01/26/2022   ID:  Diane Mayo, DOB 1951-06-10, MRN [id number]  PCP:  Maury Dus, MD    Chief Complaint  Patient presents with   Follow-up   HTN/preoperative evaluation  Wt Readings from Last 3 Encounters:  01/26/22 220 lb (99.8 kg)  12/29/20 220 lb (99.8 kg)  01/20/20 216 lb 3.2 oz (98.1 kg)       History of Present Illness: Diane Mayo is a 71 y.o. female  Who has had difficult to control BP.  renal US normal 06/02/19- metanephrine normal 07/07/19    She typically does not exercise because of her back and leg pain.    Her father had CAD.  He had stents placed at age 59.     In the past, she did try the valsartan for 3 weeks, but BP did not change.   She had a cough with lisinopril / quinapril in the past.   Initial plan in 06/2019 was: "No benefit from valsartan.  She does eat a fair mount of salt.  Avoid processed foods. Will start irbesartan.  She cannot exercise due to leg problems.  Will try to wean clonidine if irbesartan works."  [employer] watchers was recommended.     She had a back injection for back pain.    BP was well controlled in January 2022.  She is going to have surgery for spinal stenosis in 2023.  Last surgery was carpal tunnel surgery and shoulder many years ago.   Denies : Chest pain. Dizziness. Leg edema. Nitroglycerin use. Orthopnea. Palpitations. Paroxysmal nocturnal dyspnea. Shortness of breath. Syncope.   She is able to get up stairs.  No cardiac sx.  Some leg weakness and numbness limits more longer distance walking. She has to sit forr a few seconds and numbness will resolve.    No family h/o early CAD.   Past Medical History:  Diagnosis Date   Genital herpes    GERD (gastroesophageal reflux disease)    Hypertension    Interstitial cystitis    Leukocytosis    Osteoarthritis    Plantar fasciitis     Past Surgical History:  Procedure Laterality Date   CARPAL TUNNEL RELEASE Bilateral    CESAREAN  SECTION     twice   SHOULDER SURGERY     TENDON REPAIR     site not given   TUBAL LIGATION  1987     Current Outpatient Medications  Medication Sig Dispense Refill   allopurinol (ZYLOPRIM) 300 MG tablet Take 300 mg by mouth daily.     amLODipine (NORVASC) 10 MG tablet Take 10 mg by mouth daily.     celecoxib (CELEBREX) 200 MG capsule Take 200 mg by mouth daily.     cetirizine (ZYRTEC) 10 MG tablet Take 10 mg by mouth daily.     colchicine 0.6 MG tablet TAKE 1 TABLET BY MOUTH TWICE A DAY (Patient taking differently: Take 0.6 mg by mouth 2 (two) times daily as needed (gout).) 30 tablet 0   hydrochlorothiazide (MICROZIDE) 12.5 MG capsule Take 12.5 mg by mouth daily.     irbesartan (AVAPRO) 300 MG tablet TAKE 1 TABLET BY MOUTH DAILY 90 tablet 3   metFORMIN (GLUCOPHAGE) 1000 MG tablet Take 1,000 mg by mouth 2 (two) times daily with a meal.     metoprolol tartrate (LOPRESSOR) 25 MG tablet Take 75 mg by mouth 2 (two) times daily.     Multiple Vitamins-Minerals (EMERGEN-C IMMUNE PLUS)  PACK Take 1 packet by mouth daily.     nystatin cream (MYCOSTATIN) Apply 1 application topically daily as needed for rash.     omeprazole (PRILOSEC) 40 MG capsule Take 40 mg by mouth daily.     Pentosan Polysulfate Sodium 200 MG CPDR Take 200 mg by mouth 2 (two) times daily.     polyvinyl alcohol (LIQUIFILM TEARS) 1.4 % ophthalmic solution Place 1 drop into both eyes as needed for dry eyes.     pravastatin (PRAVACHOL) 40 MG tablet Take 40 mg by mouth daily.     trimethoprim (TRIMPEX) 100 MG tablet Take 100 mg by mouth daily.     vitamin B-12 (CYANOCOBALAMIN) 1000 MCG tablet Take 1,000 mcg by mouth daily.     No current facility-administered medications for this visit.    Allergies:   Lisinopril, Miconazole, and Penicillins    Social History:  The patient  reports that she has never smoked. She has never used smokeless tobacco. She reports that she does not drink alcohol and does not use drugs.   Family  History:  The patient's family history includes Alcoholism in her brother; Asthma in her brother; CAD in her father; CVA in her father; Diabetes in her mother; Diabetes type II in her maternal grandfather; Gout in her father; Heart attack in her father; Hypertension in her father and mother; Kidney failure in her father; Other in her brother and mother; Pneumonia in her mother; Skin cancer in her father.    ROS:  Please see the history of present illness.   Otherwise, review of systems are positive for leg numbness.   All other systems are reviewed and negative.    PHYSICAL EXAM: VS:  BP 130/64    Pulse 64    Ht 5' 1.5" (1.562 m)    Wt 220 lb (99.8 kg)    SpO2 98%    BMI 40.90 kg/m  , BMI Body mass index is 40.9 kg/m. GEN: Well nourished, well developed, in no acute distress HEENT: normal Neck: no JVD, carotid bruits, or masses Cardiac: RRR; no murmurs, rubs, or gallops,no edema  Respiratory:  clear to auscultation bilaterally, normal work of breathing GI: soft, nontender, nondistended, + BS, obese MS: no deformity or atrophy Skin: warm and dry, no rash Neuro:  Strength and sensation are intact Psych: euthymic mood, full affect   EKG:   The ekg ordered today demonstrates NSR, LBBB, prolonged PR interval   Recent Labs: No results found for requested labs within last 8760 hours.   Lipid Panel No results found for: CHOL, TRIG, HDL, CHOLHDL, VLDL, LDLCALC, LDLDIRECT   Other studies Reviewed: Additional studies/ records that were reviewed today with results demonstrating: Labs reviewed.   ASSESSMENT AND PLAN:  Preoperative cardiovascular examination: Able to walk up stairs without difficulty.  No cardiac symptoms.  We discussed that there is always some risk with surgery.  Her risk mostly comes from her age, diabetes and obesity which we cannot modify.  Exertion only limited by her leg.  No further cardiac testing needed prior to surgery.  Normal EF at last echo in 2020.  No signs of  heart failure on exam. Hypertension: BPs at home in the 140s range at times.  Controlled today. Left bundle branch block: Chronic.  Hyperlipidemia: LDL 50 August 2022.  Healthy diet. Diabetes: A1c 6.9 in August 2022.  Avoid processed foods.  High-fiber intake. Morbid obesity: This is also one of her risk factors for surgery and for heart disease going  forward.  Hopefully, she will be able to be more active after surgery.  Healthy diet will also be helpful.   Current medicines are reviewed at length with the patient today.  The patient concerns regarding her medicines were addressed.  The following changes have been made:  No change  Labs/ tests ordered today include:  No orders of the defined types were placed in this encounter.   Recommend 150 minutes/week of aerobic exercise Low fat, low carb, high fiber diet recommended  Disposition:   FU in 1 year   Signed, Larae Grooms, MD  01/26/2022 10:08 AM    [facility] [address], [place], [place]  [address] Phone: [phone]; Fax: [phone]

## 2022-01-27 DIAGNOSIS — M4316 Spondylolisthesis, lumbar region: Secondary | ICD-10-CM | POA: Diagnosis not present

## 2022-01-27 NOTE — Pre-Procedure Instructions (Signed)
Surgical Instructions    Your procedure is scheduled on Wednesday, February 8th.  Report to [facility] Main Entrance "A" at 06:30 A.M., then check in with the Admitting office.  Call this number if you have problems the morning of surgery:  [phone]   If you have any questions prior to your surgery date call [phone]: Open Monday-Friday 8am-4pm    Remember:  Do not eat or drink after midnight the night before your surgery      Take these medicines the morning of surgery with A SIP OF WATER  allopurinol (ZYLOPRIM) amLODipine (NORVASC) cetirizine (ZYRTEC)  metoprolol tartrate (LOPRESSOR) omeprazole (PRILOSEC) pravastatin (PRAVACHOL) trimethoprim (TRIMPEX)   If needed: colchicine  polyvinyl alcohol (LIQUIFILM TEARS)    As of today, STOP taking any Aspirin (unless otherwise instructed by your surgeon) Aleve, Naproxen, Ibuprofen, Motrin, Advil, Goody's, BC's, all herbal medications, fish oil, and all vitamins.   WHAT DO I DO ABOUT MY DIABETES MEDICATION?   Do not take metFORMIN (GLUCOPHAGE)  the morning of surgery.    HOW TO MANAGE YOUR DIABETES BEFORE AND AFTER SURGERY  Why is it important to control my blood sugar before and after surgery? Improving blood sugar levels before and after surgery helps healing and can limit problems. A way of improving blood sugar control is eating a healthy diet by:  Eating less sugar and carbohydrates  Increasing activity/exercise  Talking with your doctor about reaching your blood sugar goals High blood sugars (greater than 180 mg/dL) can raise your risk of infections and slow your recovery, so you will need to focus on controlling your diabetes during the weeks before surgery. Make sure that the doctor who takes care of your diabetes knows about your planned surgery including the date and location.  How do I manage my blood sugar before surgery? Check your blood sugar at least 4 times a day, starting 2 days before surgery,  to make sure that the level is not too high or low.  Check your blood sugar the morning of your surgery when you wake up and every 2 hours until you get to the Short Stay unit.  If your blood sugar is less than 70 mg/dL, you will need to treat for low blood sugar: Do not take insulin. Treat a low blood sugar (less than 70 mg/dL) with  cup of clear juice (cranberry or apple), 4 glucose tablets, OR glucose gel. Recheck blood sugar in 15 minutes after treatment (to make sure it is greater than 70 mg/dL). If your blood sugar is not greater than 70 mg/dL on recheck, call [phone] for further instructions. Report your blood sugar to the short stay nurse when you get to Short Stay.  If you are admitted to the hospital after surgery: Your blood sugar will be checked by the staff and you will probably be given insulin after surgery (instead of oral diabetes medicines) to make sure you have good blood sugar levels. The goal for blood sugar control after surgery is 80-180 mg/dL.                      Do NOT Smoke (Tobacco/Vaping) for 24 hours prior to your procedure.  If you use a CPAP at night, you may bring your mask/headgear for your overnight stay.   Contacts, glasses, piercing's, hearing aid's, dentures or partials may not be worn into surgery, please bring cases for these belongings.    For patients admitted to the hospital, discharge time will be determined  by your treatment team.   Patients discharged the day of surgery will not be allowed to drive home, and someone needs to stay with them for 24 hours.  NO VISITORS WILL BE ALLOWED IN PRE-OP WHERE PATIENTS ARE PREPPED FOR SURGERY.  ONLY 1 SUPPORT PERSON MAY BE PRESENT IN THE WAITING ROOM WHILE YOU ARE IN SURGERY.  IF YOU ARE TO BE ADMITTED, ONCE YOU ARE IN YOUR ROOM YOU WILL BE ALLOWED TWO (2) VISITORS. (1) VISITOR MAY STAY OVERNIGHT BUT MUST ARRIVE TO THE ROOM BY 8pm.  Minor children may have two parents present. Special consideration for  safety and communication needs will be reviewed on a case by case basis.   Special instructions:   [facility]- Preparing For Surgery  Before surgery, you can play an important role. Because skin is not sterile, your skin needs to be as free of germs as possible. You can reduce the number of germs on your skin by washing with CHG (chlorahexidine gluconate) Soap before surgery.  CHG is an antiseptic cleaner which kills germs and bonds with the skin to continue killing germs even after washing.    Oral Hygiene is also important to reduce your risk of infection.  Remember - BRUSH YOUR TEETH THE MORNING OF SURGERY WITH YOUR REGULAR TOOTHPASTE  Please do not use if you have an allergy to CHG or antibacterial soaps. If your skin becomes reddened/irritated stop using the CHG.  Do not shave (including legs and underarms) for at least 48 hours prior to first CHG shower. It is OK to shave your face.  Please follow these instructions carefully.   Shower the NIGHT BEFORE SURGERY and the MORNING OF SURGERY  If you chose to wash your hair, wash your hair first as usual with your normal shampoo.  After you shampoo, rinse your hair and body thoroughly to remove the shampoo.  Use CHG Soap as you would any other liquid soap. You can apply CHG directly to the skin and wash gently with a scrungie or a clean washcloth.   Apply the CHG Soap to your body ONLY FROM THE NECK DOWN.  Do not use on open wounds or open sores. Avoid contact with your eyes, ears, mouth and genitals (private parts). Wash Face and genitals (private parts)  with your normal soap.   Wash thoroughly, paying special attention to the area where your surgery will be performed.  Thoroughly rinse your body with warm water from the neck down.  DO NOT shower/wash with your normal soap after using and rinsing off the CHG Soap.  Pat yourself dry with a CLEAN TOWEL.  Wear CLEAN PAJAMAS to bed the night before surgery  Place CLEAN SHEETS on  your bed the night before your surgery  DO NOT SLEEP WITH PETS.   Day of Surgery: Shower with CHG soap. Do not wear jewelry, make up, nail polish, gel polish, artificial nails, or any other type of covering on natural nails including finger and toenails. If patients have artificial nails, gel coating, etc. that need to be removed by a nail salon please have this removed prior to surgery. Surgery may need to be canceled/delayed if the surgeon/anesthesiologist feels like the patient is unable to be adequately monitored. Do not wear lotions, powders, perfumes, or deodorant. Do not shave 48 hours prior to surgery.   Do not bring valuables to the hospital. [facility] is not responsible for any belongings or valuables. Wear Clean/Comfortable clothing the morning of surgery Remember to brush your  teeth WITH YOUR REGULAR TOOTHPASTE.   Please read over the following fact sheets that you were given.   3 days prior to your procedure or After your COVID test   You are not required to quarantine however you are required to wear a well-fitting mask when you are out and around people not in your household. If your mask becomes wet or soiled, replace with a new one.   Wash your hands often with soap and water for 20 seconds or clean your hands with an alcohol-based hand sanitizer that contains at least 60% alcohol.   Do not share personal items.   Notify your provider:  o if you are in close contact with someone who has COVID  o or if you develop a fever of 100.4 or greater, sneezing, cough, sore throat, shortness of breath or body aches.

## 2022-01-30 ENCOUNTER — Telehealth: Payer: Self-pay | Admitting: Interventional Cardiology

## 2022-01-30 ENCOUNTER — Encounter (HOSPITAL_COMMUNITY): Payer: Self-pay

## 2022-01-30 ENCOUNTER — Encounter (HOSPITAL_COMMUNITY)
Admission: RE | Admit: 2022-01-30 | Discharge: 2022-01-30 | Disposition: A | Discharge status: Home / Self Care | Payer: PPO | Source: Ambulatory Visit | Attending: Neurosurgery | Admitting: Neurosurgery

## 2022-01-30 ENCOUNTER — Other Ambulatory Visit: Payer: Self-pay

## 2022-01-30 VITALS — BP 163/74 | HR 76 | Temp 98.6°F | Resp 18 | Ht 61.5 in | Wt 219.6 lb

## 2022-01-30 DIAGNOSIS — E785 Hyperlipidemia, unspecified: Secondary | ICD-10-CM | POA: Insufficient documentation

## 2022-01-30 DIAGNOSIS — Z01812 Encounter for preprocedural laboratory examination: Secondary | ICD-10-CM | POA: Insufficient documentation

## 2022-01-30 DIAGNOSIS — Z883 Allergy status to other anti-infective agents status: Secondary | ICD-10-CM | POA: Diagnosis not present

## 2022-01-30 DIAGNOSIS — Z20822 Contact with and (suspected) exposure to covid-19: Secondary | ICD-10-CM | POA: Diagnosis present

## 2022-01-30 DIAGNOSIS — E669 Obesity, unspecified: Secondary | ICD-10-CM | POA: Insufficient documentation

## 2022-01-30 DIAGNOSIS — M48062 Spinal stenosis, lumbar region with neurogenic claudication: Secondary | ICD-10-CM | POA: Diagnosis present

## 2022-01-30 DIAGNOSIS — Z833 Family history of diabetes mellitus: Secondary | ICD-10-CM | POA: Diagnosis not present

## 2022-01-30 DIAGNOSIS — Z9842 Cataract extraction status, left eye: Secondary | ICD-10-CM | POA: Diagnosis not present

## 2022-01-30 DIAGNOSIS — M1990 Unspecified osteoarthritis, unspecified site: Secondary | ICD-10-CM | POA: Diagnosis present

## 2022-01-30 DIAGNOSIS — M4316 Spondylolisthesis, lumbar region: Secondary | ICD-10-CM | POA: Diagnosis not present

## 2022-01-30 DIAGNOSIS — Z888 Allergy status to other drugs, medicaments and biological substances status: Secondary | ICD-10-CM | POA: Diagnosis not present

## 2022-01-30 DIAGNOSIS — M549 Dorsalgia, unspecified: Secondary | ICD-10-CM | POA: Diagnosis present

## 2022-01-30 DIAGNOSIS — Z981 Arthrodesis status: Secondary | ICD-10-CM | POA: Diagnosis not present

## 2022-01-30 DIAGNOSIS — E119 Type 2 diabetes mellitus without complications: Secondary | ICD-10-CM | POA: Insufficient documentation

## 2022-01-30 DIAGNOSIS — Z9841 Cataract extraction status, right eye: Secondary | ICD-10-CM | POA: Diagnosis not present

## 2022-01-30 DIAGNOSIS — M5416 Radiculopathy, lumbar region: Secondary | ICD-10-CM | POA: Diagnosis not present

## 2022-01-30 DIAGNOSIS — I10 Essential (primary) hypertension: Secondary | ICD-10-CM | POA: Diagnosis present

## 2022-01-30 DIAGNOSIS — Z01818 Encounter for other preprocedural examination: Secondary | ICD-10-CM

## 2022-01-30 DIAGNOSIS — Z7984 Long term (current) use of oral hypoglycemic drugs: Secondary | ICD-10-CM | POA: Diagnosis not present

## 2022-01-30 DIAGNOSIS — E1165 Type 2 diabetes mellitus with hyperglycemia: Secondary | ICD-10-CM | POA: Diagnosis not present

## 2022-01-30 DIAGNOSIS — K219 Gastro-esophageal reflux disease without esophagitis: Secondary | ICD-10-CM | POA: Diagnosis present

## 2022-01-30 DIAGNOSIS — M722 Plantar fascial fibromatosis: Secondary | ICD-10-CM | POA: Diagnosis present

## 2022-01-30 DIAGNOSIS — Z880 Allergy status to penicillin: Secondary | ICD-10-CM | POA: Diagnosis not present

## 2022-01-30 DIAGNOSIS — I447 Left bundle-branch block, unspecified: Secondary | ICD-10-CM | POA: Diagnosis present

## 2022-01-30 DIAGNOSIS — M4326 Fusion of spine, lumbar region: Secondary | ICD-10-CM | POA: Diagnosis not present

## 2022-01-30 DIAGNOSIS — Z791 Long term (current) use of non-steroidal anti-inflammatories (NSAID): Secondary | ICD-10-CM | POA: Diagnosis not present

## 2022-01-30 DIAGNOSIS — Z8249 Family history of ischemic heart disease and other diseases of the circulatory system: Secondary | ICD-10-CM | POA: Diagnosis not present

## 2022-01-30 DIAGNOSIS — Z79899 Other long term (current) drug therapy: Secondary | ICD-10-CM | POA: Diagnosis not present

## 2022-01-30 HISTORY — DX: Type 2 diabetes mellitus without complications: E11.9

## 2022-01-30 HISTORY — DX: Left bundle-branch block, unspecified: I44.7

## 2022-01-30 LAB — SARS CORONAVIRUS 2 (TAT 6-24 HRS): SARS Coronavirus 2: NEGATIVE

## 2022-01-30 LAB — CBC
HCT: 40.8 % (ref 36.0–46.0)
Hemoglobin: 12.8 g/dL (ref 12.0–15.0)
MCH: 27.6 pg (ref 26.0–34.0)
MCHC: 31.4 g/dL (ref 30.0–36.0)
MCV: 88.1 fL (ref 80.0–100.0)
Platelets: 291 10*3/uL (ref 150–400)
RBC: 4.63 MIL/uL (ref 3.87–5.11)
RDW: 16 % — ABNORMAL HIGH (ref 11.5–15.5)
WBC: 12.9 10*3/uL — ABNORMAL HIGH (ref 4.0–10.5)
nRBC: 0 % (ref 0.0–0.2)

## 2022-01-30 LAB — HEMOGLOBIN A1C
Hgb A1c MFr Bld: 7 % — ABNORMAL HIGH (ref 4.8–5.6)
Mean Plasma Glucose: 154.2 mg/dL

## 2022-01-30 LAB — BASIC METABOLIC PANEL
Anion gap: 14 (ref 5–15)
BUN: 12 mg/dL (ref 8–23)
CO2: 22 mmol/L (ref 22–32)
Calcium: 9.5 mg/dL (ref 8.9–10.3)
Chloride: 101 mmol/L (ref 98–111)
Creatinine, Ser: 0.72 mg/dL (ref 0.44–1.00)
GFR, Estimated: >60 mL/min (ref >60)
Glucose, Bld: 135 mg/dL — ABNORMAL HIGH (ref 70–99)
Potassium: 4.1 mmol/L (ref 3.5–5.1)
Sodium: 137 mmol/L (ref 135–145)

## 2022-01-30 LAB — TYPE AND SCREEN
ABO/RH(D): O POS
Antibody Screen: NEGATIVE

## 2022-01-30 LAB — GLUCOSE, CAPILLARY: Glucose-Capillary: 135 mg/dL — ABNORMAL HIGH (ref 70–99)

## 2022-01-30 LAB — SURGICAL PCR SCREEN
MRSA, PCR: NEGATIVE
Staphylococcus aureus: NEGATIVE

## 2022-01-30 NOTE — Telephone Encounter (Signed)
Spoke with Moshe Salisbury and advised that I would have medical records scan the patient's EKG in as soon as possible.

## 2022-01-30 NOTE — Progress Notes (Signed)
PCP - Dr. Maury Dus Cardiologist - Dr. Larae Grooms- pt saw on 01/26/22 for pre-operative clearance  PPM/ICD - denies   Chest x-ray - 01/11/2010 EKG - 01/26/22- call placed to Dr. Hassell Done office to release EKG performed on 01/26/22. Notes show that it was NSR with chronic LBBB Stress Test - denies ECHO - 07/16/19 Cardiac Cath - denies  Sleep Study - denies  DM- Type 2 Fasting Blood Sugar - 140-150 Checks Blood Sugar around about 3 times/week  Blood Thinner Instructions: n/a Aspirin Instructions: n/a  ERAS Protcol - no, NPO   COVID TEST- 01/30/22 at PAT   Anesthesia review: yes, cardiac hx   Patient denies shortness of breath, fever, cough and chest pain at PAT appointment   All instructions explained to the patient, with a verbal understanding of the material. Patient agrees to go over the instructions while at home for a better understanding. Patient also instructed to wear a mask in public after being tested for COVID-19. The opportunity to ask questions was provided.

## 2022-01-30 NOTE — Telephone Encounter (Signed)
Cara from [facility] calling to request the patient's ekg results be released. She would like a call back at: [phone]

## 2022-01-31 NOTE — Telephone Encounter (Signed)
I will forward this to pre op provider to see notes from Dr. Irish Lack about clearance

## 2022-01-31 NOTE — Anesthesia Preprocedure Evaluation (Addendum)
Anesthesia Evaluation  Patient identified by MRN, date of birth, ID band Patient awake    Reviewed: Allergy & Precautions, NPO status , Patient's Chart, lab work & pertinent test results, reviewed documented beta blocker date and time   Airway Mallampati: III  TM Distance: >3 FB Neck ROM: Full    Dental no notable dental hx. (+) Dental Advisory Given   Pulmonary  Snores at night, no witnessed apneas, never had a sleep study    Pulmonary exam normal breath sounds clear to auscultation       Cardiovascular hypertension (135/65 in preop), Pt. on medications and Pt. on home beta blockers Normal cardiovascular exam+ dysrhythmias (LBBB)  Rhythm:Regular Rate:Normal  Follows with cardiology for history of difficult to control hypertension, left bundle branch block, dyslipidemia.  Last seen by Dr. Irish Lack 01/26/22 for preop evaluation.  Per note, "Preoperative cardiovascular examination: Able to walk up stairs without difficulty. No cardiac symptoms. We discussed that there is always some risk with surgery. Her risk mostly comes from her age, diabetes and obesity which we cannot modify. Exertion only limited by her leg. No further cardiac testing needed prior to surgery. Normal EF at last echo in 2020. No signs of heart failure on exam."   TTE 07/16/2019: 1. The left ventricle has low normal systolic function, with an ejection  fraction of 50-55%. The cavity size was normal. Left ventricular diastolic  Doppler parameters are consistent with pseudonormalization. Elevated mean  left atrial pressure There is  abnormal septal motion consistent with left bundle branch block.  2. The right ventricle has normal systolic function. The cavity was  normal. There is no increase in right ventricular wall thickness.  3. The aorta is normal in size and structure.   EKG: prolonged PR, LBBB   Neuro/Psych negative neurological ROS  negative psych  ROS   GI/Hepatic Neg liver ROS, GERD  Medicated and Controlled,  Endo/Other  diabetes, Poorly Controlled, Type 2, Oral Hypoglycemic AgentsMorbid obesityFS 154 in preop a1c 7.0 BMI 40  Renal/GU negative Renal ROS  negative genitourinary   Musculoskeletal  (+) Arthritis , Osteoarthritis,    Abdominal (+) + obese,   Peds  Hematology negative hematology ROS (+) hct 40.8   Anesthesia Other Findings   Reproductive/Obstetrics negative OB ROS                           Anesthesia Physical Anesthesia Plan  ASA: 3  Anesthesia Plan: General   Post-op Pain Management: Tylenol PO (pre-op) and Ketamine IV   Induction: Intravenous  PONV Risk Score and Plan: 3 and Ondansetron, Dexamethasone, Midazolam and Treatment may vary due to age or medical condition  Airway Management Planned: Oral ETT  Additional Equipment: None  Intra-op Plan:   Post-operative Plan: Extubation in OR  Informed Consent: I have reviewed the patients History and Physical, chart, labs and discussed the procedure including the risks, benefits and alternatives for the proposed anesthesia with the patient or authorized representative who has indicated his/her understanding and acceptance.     Dental advisory given  Plan Discussed with: CRNA  Anesthesia Plan Comments: ( )      Anesthesia Quick Evaluation

## 2022-01-31 NOTE — Telephone Encounter (Signed)
° °  Patient Name: Diane Mayo  DOB: 1951/04/05 MRN: [id number]  Primary Cardiologist: Larae Grooms, MD  Chart reviewed as part of pre-operative protocol coverage. Clearance provided by Dr. Irish Lack at [facility] yesterday. Will route this bundled recommendation to requesting provider via Epic fax function. Please call with questions.   Charlie Pitter, PA-C 01/31/2022, 10:07 AM

## 2022-01-31 NOTE — Progress Notes (Signed)
Anesthesia Chart Review:  Follows with cardiology for history of difficult to control hypertension, left bundle branch block, dyslipidemia.  Last seen by Dr. Irish Lack 01/26/22 for preop evaluation.  Per note, "Preoperative cardiovascular examination: Able to walk up stairs without difficulty.  No cardiac symptoms.  We discussed that there is always some risk with surgery.  Her risk mostly comes from her age, diabetes and obesity which we cannot modify.  Exertion only limited by her leg.  No further cardiac testing needed prior to surgery.  Normal EF at last echo in 2020.  No signs of heart failure on exam."  Non-insulin-dependent DM2, A1c 7.0 on preop labs.  Preop labs reviewed, unremarkable.  EKG 01/26/22: Read per Dr. Hassell Done note same date - tracing not available in Epic: NSR, LBBB, prolonged PR interval.  TTE 07/16/2019:  1. The left ventricle has low normal systolic function, with an ejection  fraction of 50-55%. The cavity size was normal. Left ventricular diastolic  Doppler parameters are consistent with pseudonormalization. Elevated mean  left atrial pressure There is  abnormal septal motion consistent with left bundle branch block.   2. The right ventricle has normal systolic function. The cavity was  normal. There is no increase in right ventricular wall thickness.   3. The aorta is normal in size and structure.    Wynonia Musty [facility] Short Stay Center/Anesthesiology Phone [phone] 01/31/2022 9:54 AM

## 2022-02-01 ENCOUNTER — Inpatient Hospital Stay (HOSPITAL_COMMUNITY): Payer: PPO

## 2022-02-01 ENCOUNTER — Inpatient Hospital Stay (HOSPITAL_COMMUNITY): Payer: PPO | Admitting: Certified Registered"

## 2022-02-01 ENCOUNTER — Inpatient Hospital Stay (HOSPITAL_COMMUNITY): Payer: PPO | Admitting: Vascular Surgery

## 2022-02-01 ENCOUNTER — Inpatient Hospital Stay (HOSPITAL_COMMUNITY)
Admission: RE | Admit: 2022-02-01 | Discharge: 2022-02-02 | DRG: 460 | Disposition: A | Discharge status: Home / Self Care | Payer: PPO | Attending: Neurosurgery | Admitting: Neurosurgery

## 2022-02-01 ENCOUNTER — Encounter (HOSPITAL_COMMUNITY): Payer: Self-pay | Admitting: Neurosurgery

## 2022-02-01 ENCOUNTER — Encounter (HOSPITAL_COMMUNITY)
Admission: RE | Disposition: A | Discharge status: Home / Self Care | Payer: Self-pay | Source: Home / Self Care | Attending: Neurosurgery

## 2022-02-01 ENCOUNTER — Other Ambulatory Visit: Payer: Self-pay

## 2022-02-01 DIAGNOSIS — M4316 Spondylolisthesis, lumbar region: Secondary | ICD-10-CM | POA: Diagnosis not present

## 2022-02-01 DIAGNOSIS — M48062 Spinal stenosis, lumbar region with neurogenic claudication: Principal | ICD-10-CM | POA: Diagnosis present

## 2022-02-01 DIAGNOSIS — M722 Plantar fascial fibromatosis: Secondary | ICD-10-CM | POA: Diagnosis present

## 2022-02-01 DIAGNOSIS — Z9842 Cataract extraction status, left eye: Secondary | ICD-10-CM

## 2022-02-01 DIAGNOSIS — Z833 Family history of diabetes mellitus: Secondary | ICD-10-CM

## 2022-02-01 DIAGNOSIS — Z9841 Cataract extraction status, right eye: Secondary | ICD-10-CM | POA: Diagnosis not present

## 2022-02-01 DIAGNOSIS — Z20822 Contact with and (suspected) exposure to covid-19: Secondary | ICD-10-CM | POA: Diagnosis present

## 2022-02-01 DIAGNOSIS — Z883 Allergy status to other anti-infective agents status: Secondary | ICD-10-CM

## 2022-02-01 DIAGNOSIS — Z419 Encounter for procedure for purposes other than remedying health state, unspecified: Secondary | ICD-10-CM

## 2022-02-01 DIAGNOSIS — Z791 Long term (current) use of non-steroidal anti-inflammatories (NSAID): Secondary | ICD-10-CM

## 2022-02-01 DIAGNOSIS — K219 Gastro-esophageal reflux disease without esophagitis: Secondary | ICD-10-CM | POA: Diagnosis present

## 2022-02-01 DIAGNOSIS — Z7984 Long term (current) use of oral hypoglycemic drugs: Secondary | ICD-10-CM | POA: Diagnosis not present

## 2022-02-01 DIAGNOSIS — I10 Essential (primary) hypertension: Secondary | ICD-10-CM | POA: Diagnosis present

## 2022-02-01 DIAGNOSIS — I447 Left bundle-branch block, unspecified: Secondary | ICD-10-CM | POA: Diagnosis present

## 2022-02-01 DIAGNOSIS — E1165 Type 2 diabetes mellitus with hyperglycemia: Secondary | ICD-10-CM | POA: Diagnosis not present

## 2022-02-01 DIAGNOSIS — Z888 Allergy status to other drugs, medicaments and biological substances status: Secondary | ICD-10-CM | POA: Diagnosis not present

## 2022-02-01 DIAGNOSIS — Z880 Allergy status to penicillin: Secondary | ICD-10-CM | POA: Diagnosis not present

## 2022-02-01 DIAGNOSIS — E119 Type 2 diabetes mellitus without complications: Secondary | ICD-10-CM | POA: Diagnosis present

## 2022-02-01 DIAGNOSIS — M4326 Fusion of spine, lumbar region: Secondary | ICD-10-CM | POA: Diagnosis not present

## 2022-02-01 DIAGNOSIS — M5416 Radiculopathy, lumbar region: Secondary | ICD-10-CM | POA: Diagnosis not present

## 2022-02-01 DIAGNOSIS — Z8249 Family history of ischemic heart disease and other diseases of the circulatory system: Secondary | ICD-10-CM

## 2022-02-01 DIAGNOSIS — Z981 Arthrodesis status: Secondary | ICD-10-CM | POA: Diagnosis not present

## 2022-02-01 DIAGNOSIS — M1990 Unspecified osteoarthritis, unspecified site: Secondary | ICD-10-CM | POA: Diagnosis present

## 2022-02-01 DIAGNOSIS — M549 Dorsalgia, unspecified: Secondary | ICD-10-CM | POA: Diagnosis present

## 2022-02-01 DIAGNOSIS — M48061 Spinal stenosis, lumbar region without neurogenic claudication: Secondary | ICD-10-CM | POA: Diagnosis present

## 2022-02-01 DIAGNOSIS — Z79899 Other long term (current) drug therapy: Secondary | ICD-10-CM

## 2022-02-01 LAB — GLUCOSE, CAPILLARY
Glucose-Capillary: 154 mg/dL — ABNORMAL HIGH (ref 70–99)
Glucose-Capillary: 168 mg/dL — ABNORMAL HIGH (ref 70–99)
Glucose-Capillary: 192 mg/dL — ABNORMAL HIGH (ref 70–99)
Glucose-Capillary: 191 mg/dL — ABNORMAL HIGH (ref 70–99)
Glucose-Capillary: 250 mg/dL — ABNORMAL HIGH (ref 70–99)
Glucose-Capillary: 213 mg/dL — ABNORMAL HIGH (ref 70–99)

## 2022-02-01 LAB — ABO/RH: ABO/RH(D): O POS

## 2022-02-01 SURGERY — POSTERIOR LUMBAR FUSION 2 LEVEL
Anesthesia: General | Site: Spine Lumbar

## 2022-02-01 MED ORDER — CHLORHEXIDINE GLUCONATE CLOTH 2 % EX PADS: 6.0000 | MEDICATED_PAD | Freq: Once | CUTANEOUS | Status: DC

## 2022-02-01 MED ORDER — PENTOSAN POLYSULFATE SODIUM 100 MG PO CAPS
200.0000 mg | ORAL_CAPSULE | Freq: Two times a day (BID) | ORAL | Status: DC
  Administered 2022-02-01: 200 mg via ORAL
  Filled 2022-02-01 (×3): qty 2

## 2022-02-01 MED ORDER — NYSTATIN 100000 UNIT/GM EX CREA: 1.0000 "application " | TOPICAL_CREAM | Freq: Every day | CUTANEOUS | Status: DC | PRN

## 2022-02-01 MED ORDER — INSULIN ASPART 100 UNIT/ML IJ SOLN
0.0000 [IU] | INTRAMUSCULAR | Status: AC | PRN
  Administered 2022-02-01: 2 [IU] via SUBCUTANEOUS
  Administered 2022-02-01: 4 [IU] via SUBCUTANEOUS
  Administered 2022-02-01: 2 [IU] via SUBCUTANEOUS
  Filled 2022-02-01: qty 1

## 2022-02-01 MED ORDER — SUGAMMADEX SODIUM 200 MG/2ML IV SOLN
INTRAVENOUS | Status: DC | PRN
  Administered 2022-02-01: 200 mg via INTRAVENOUS

## 2022-02-01 MED ORDER — LIDOCAINE-EPINEPHRINE 1 %-1:100000 IJ SOLN
INTRAMUSCULAR | Status: DC | PRN
  Administered 2022-02-01: 10 mL

## 2022-02-01 MED ORDER — IRBESARTAN 150 MG PO TABS
300.0000 mg | ORAL_TABLET | Freq: Every day | ORAL | Status: DC
  Administered 2022-02-01: 300 mg via ORAL
  Filled 2022-02-01: qty 2

## 2022-02-01 MED ORDER — 0.9 % SODIUM CHLORIDE (POUR BTL) OPTIME
TOPICAL | Status: DC | PRN
Start: 2022-02-01 — End: 2022-02-01
  Administered 2022-02-01: 1000 mL

## 2022-02-01 MED ORDER — CYCLOBENZAPRINE HCL 10 MG PO TABS: 10.0000 mg | ORAL_TABLET | Freq: Three times a day (TID) | ORAL | Status: DC | PRN

## 2022-02-01 MED ORDER — MIDAZOLAM HCL 2 MG/2ML IJ SOLN
INTRAMUSCULAR | Status: DC | PRN
  Administered 2022-02-01: 2 mg via INTRAVENOUS

## 2022-02-01 MED ORDER — ACETAMINOPHEN 10 MG/ML IV SOLN
INTRAVENOUS | Status: AC
  Filled 2022-02-01: qty 100

## 2022-02-01 MED ORDER — MENTHOL 3 MG MT LOZG: 1.0000 | LOZENGE | OROMUCOSAL | Status: DC | PRN

## 2022-02-01 MED ORDER — FENTANYL CITRATE (PF) 250 MCG/5ML IJ SOLN
INTRAMUSCULAR | Status: AC
  Filled 2022-02-01: qty 5

## 2022-02-01 MED ORDER — HYDROCHLOROTHIAZIDE 12.5 MG PO TABS
12.5000 mg | ORAL_TABLET | Freq: Every day | ORAL | Status: DC
  Administered 2022-02-01: 12.5 mg via ORAL
  Filled 2022-02-01: qty 1

## 2022-02-01 MED ORDER — BUPIVACAINE LIPOSOME 1.3 % IJ SUSP
INTRAMUSCULAR | Status: AC
  Filled 2022-02-01: qty 20

## 2022-02-01 MED ORDER — CEFAZOLIN SODIUM-DEXTROSE 2-4 GM/100ML-% IV SOLN
2.0000 g | Freq: Three times a day (TID) | INTRAVENOUS | Status: DC
  Administered 2022-02-01 – 2022-02-02 (×2): 2 g via INTRAVENOUS
  Filled 2022-02-01 (×2): qty 100

## 2022-02-01 MED ORDER — OXYCODONE HCL 5 MG/5ML PO SOLN: 5.0000 mg | Freq: Once | ORAL | Status: DC | PRN

## 2022-02-01 MED ORDER — KETAMINE HCL 50 MG/5ML IJ SOSY
PREFILLED_SYRINGE | INTRAMUSCULAR | Status: AC
  Filled 2022-02-01: qty 5

## 2022-02-01 MED ORDER — ONDANSETRON HCL 4 MG PO TABS: 4.0000 mg | ORAL_TABLET | Freq: Four times a day (QID) | ORAL | Status: DC | PRN

## 2022-02-01 MED ORDER — OXYCODONE HCL 5 MG PO TABS: 5.0000 mg | ORAL_TABLET | Freq: Once | ORAL | Status: DC | PRN

## 2022-02-01 MED ORDER — CHLORHEXIDINE GLUCONATE 0.12 % MT SOLN
15.0000 mL | Freq: Once | OROMUCOSAL | Status: AC
  Administered 2022-02-01: 15 mL via OROMUCOSAL
  Filled 2022-02-01: qty 15

## 2022-02-01 MED ORDER — CEFAZOLIN SODIUM-DEXTROSE 2-3 GM-%(50ML) IV SOLR
INTRAVENOUS | Status: DC | PRN
Start: 2022-02-01 — End: 2022-02-01
  Administered 2022-02-01: 2 g via INTRAVENOUS

## 2022-02-01 MED ORDER — INSULIN ASPART 100 UNIT/ML IJ SOLN
0.0000 [IU] | Freq: Three times a day (TID) | INTRAMUSCULAR | Status: DC
  Administered 2022-02-02: 3 [IU] via SUBCUTANEOUS

## 2022-02-01 MED ORDER — HYDROMORPHONE HCL 1 MG/ML IJ SOLN
0.2500 mg | INTRAMUSCULAR | Status: DC | PRN
  Administered 2022-02-01: 0.5 mg via INTRAVENOUS

## 2022-02-01 MED ORDER — SODIUM CHLORIDE 0.9% FLUSH
3.0000 mL | Freq: Two times a day (BID) | INTRAVENOUS | Status: DC
  Administered 2022-02-01: 3 mL via INTRAVENOUS

## 2022-02-01 MED ORDER — HYDROCODONE-ACETAMINOPHEN 5-325 MG PO TABS
2.0000 | ORAL_TABLET | ORAL | Status: DC | PRN
  Administered 2022-02-01 – 2022-02-02 (×5): 2 via ORAL
  Filled 2022-02-01 (×5): qty 2

## 2022-02-01 MED ORDER — PHENOL 1.4 % MT LIQD: 1.0000 | OROMUCOSAL | Status: DC | PRN

## 2022-02-01 MED ORDER — LIDOCAINE 2% (20 MG/ML) 5 ML SYRINGE
INTRAMUSCULAR | Status: DC | PRN
  Administered 2022-02-01: 60 mg via INTRAVENOUS

## 2022-02-01 MED ORDER — METOPROLOL TARTRATE 25 MG PO TABS: 75.0000 mg | ORAL_TABLET | Freq: Two times a day (BID) | ORAL | Status: DC

## 2022-02-01 MED ORDER — ONDANSETRON HCL 4 MG/2ML IJ SOLN
INTRAMUSCULAR | Status: DC | PRN
  Administered 2022-02-01: 4 mg via INTRAVENOUS

## 2022-02-01 MED ORDER — ACETAMINOPHEN 500 MG PO TABS: 1000.0000 mg | ORAL_TABLET | Freq: Once | ORAL | Status: DC

## 2022-02-01 MED ORDER — AMLODIPINE BESYLATE 5 MG PO TABS: 10.0000 mg | ORAL_TABLET | Freq: Every day | ORAL | Status: DC

## 2022-02-01 MED ORDER — ROCURONIUM BROMIDE 10 MG/ML (PF) SYRINGE
PREFILLED_SYRINGE | INTRAVENOUS | Status: DC | PRN
  Administered 2022-02-01: 100 mg via INTRAVENOUS
  Administered 2022-02-01: 30 mg via INTRAVENOUS

## 2022-02-01 MED ORDER — HYDROMORPHONE HCL 1 MG/ML IJ SOLN: 0.5000 mg | INTRAMUSCULAR | Status: DC | PRN

## 2022-02-01 MED ORDER — PROPOFOL 10 MG/ML IV BOLUS
INTRAVENOUS | Status: AC
  Filled 2022-02-01: qty 20

## 2022-02-01 MED ORDER — PANTOPRAZOLE SODIUM 40 MG IV SOLR: 40.0000 mg | Freq: Every day | INTRAVENOUS | Status: DC

## 2022-02-01 MED ORDER — LIDOCAINE-EPINEPHRINE 1 %-1:100000 IJ SOLN
INTRAMUSCULAR | Status: AC
  Filled 2022-02-01: qty 1

## 2022-02-01 MED ORDER — ACETAMINOPHEN 10 MG/ML IV SOLN
INTRAVENOUS | Status: DC | PRN
  Administered 2022-02-01: 1000 mg via INTRAVENOUS

## 2022-02-01 MED ORDER — EMERGEN-C IMMUNE PLUS PO PACK: 1.0000 | PACK | Freq: Every day | ORAL | Status: DC

## 2022-02-01 MED ORDER — ACETAMINOPHEN 500 MG PO TABS: 1000.0000 mg | ORAL_TABLET | Freq: Four times a day (QID) | ORAL | Status: DC | PRN

## 2022-02-01 MED ORDER — BUPIVACAINE LIPOSOME 1.3 % IJ SUSP
INTRAMUSCULAR | Status: DC | PRN
  Administered 2022-02-01: 20 mL

## 2022-02-01 MED ORDER — SODIUM CHLORIDE 0.9% FLUSH: 3.0000 mL | INTRAVENOUS | Status: DC | PRN

## 2022-02-01 MED ORDER — ONDANSETRON HCL 4 MG/2ML IJ SOLN: 4.0000 mg | Freq: Once | INTRAMUSCULAR | Status: DC | PRN

## 2022-02-01 MED ORDER — VANCOMYCIN HCL IN DEXTROSE 1-5 GM/200ML-% IV SOLN
1000.0000 mg | INTRAVENOUS | Status: AC
  Administered 2022-02-01: 1000 mg via INTRAVENOUS
  Filled 2022-02-01: qty 200

## 2022-02-01 MED ORDER — PHENYLEPHRINE HCL (PRESSORS) 10 MG/ML IV SOLN
INTRAVENOUS | Status: DC | PRN
  Administered 2022-02-01: 40 ug via INTRAVENOUS
  Administered 2022-02-01: 80 ug via INTRAVENOUS
  Administered 2022-02-01: 40 ug via INTRAVENOUS

## 2022-02-01 MED ORDER — INSULIN ASPART 100 UNIT/ML IJ SOLN
0.0000 [IU] | Freq: Every day | INTRAMUSCULAR | Status: DC
  Administered 2022-02-01: 2 [IU] via SUBCUTANEOUS

## 2022-02-01 MED ORDER — HYDROCHLOROTHIAZIDE 12.5 MG PO CAPS
12.5000 mg | ORAL_CAPSULE | Freq: Every day | ORAL | Status: DC
  Filled 2022-02-01: qty 1

## 2022-02-01 MED ORDER — ALUM & MAG HYDROXIDE-SIMETH 200-200-20 MG/5ML PO SUSP: 30.0000 mL | Freq: Four times a day (QID) | ORAL | Status: DC | PRN

## 2022-02-01 MED ORDER — CELECOXIB 200 MG PO CAPS
200.0000 mg | ORAL_CAPSULE | Freq: Every day | ORAL | Status: DC
Start: 2022-02-02 — End: 2022-02-02

## 2022-02-01 MED ORDER — CEFAZOLIN SODIUM-DEXTROSE 2-4 GM/100ML-% IV SOLN
INTRAVENOUS | Status: AC
  Filled 2022-02-01: qty 100

## 2022-02-01 MED ORDER — PANTOPRAZOLE SODIUM 40 MG PO TBEC: 40.0000 mg | DELAYED_RELEASE_TABLET | Freq: Every day | ORAL | Status: DC

## 2022-02-01 MED ORDER — COLCHICINE 0.6 MG PO TABS: 0.6000 mg | ORAL_TABLET | Freq: Two times a day (BID) | ORAL | Status: DC | PRN

## 2022-02-01 MED ORDER — PHENYLEPHRINE HCL-NACL 20-0.9 MG/250ML-% IV SOLN
INTRAVENOUS | Status: DC | PRN
  Administered 2022-02-01: 30 ug/min via INTRAVENOUS

## 2022-02-01 MED ORDER — KETAMINE HCL 10 MG/ML IJ SOLN
INTRAMUSCULAR | Status: DC | PRN
  Administered 2022-02-01 (×2): 10 mg via INTRAVENOUS
  Administered 2022-02-01: 30 mg via INTRAVENOUS

## 2022-02-01 MED ORDER — SODIUM CHLORIDE 0.9 % IV SOLN
250.0000 mL | INTRAVENOUS | Status: DC
  Administered 2022-02-01: 250 mL via INTRAVENOUS

## 2022-02-01 MED ORDER — THROMBIN 20000 UNITS EX SOLR: CUTANEOUS | Status: DC | PRN

## 2022-02-01 MED ORDER — VITAMIN B-12 1000 MCG PO TABS
1000.0000 ug | ORAL_TABLET | Freq: Every day | ORAL | Status: DC
  Administered 2022-02-01: 1000 ug via ORAL
  Filled 2022-02-01: qty 1

## 2022-02-01 MED ORDER — ACETAMINOPHEN 650 MG RE SUPP: 650.0000 mg | RECTAL | Status: DC | PRN

## 2022-02-01 MED ORDER — METFORMIN HCL 500 MG PO TABS
1000.0000 mg | ORAL_TABLET | Freq: Two times a day (BID) | ORAL | Status: DC
  Administered 2022-02-01 – 2022-02-02 (×2): 1000 mg via ORAL
  Filled 2022-02-01 (×2): qty 2

## 2022-02-01 MED ORDER — ALLOPURINOL 300 MG PO TABS
300.0000 mg | ORAL_TABLET | Freq: Every day | ORAL | Status: DC
  Filled 2022-02-01: qty 1

## 2022-02-01 MED ORDER — AMISULPRIDE (ANTIEMETIC) 5 MG/2ML IV SOLN: 10.0000 mg | Freq: Once | INTRAVENOUS | Status: DC | PRN

## 2022-02-01 MED ORDER — THROMBIN 20000 UNITS EX SOLR
CUTANEOUS | Status: AC
  Filled 2022-02-01: qty 20000

## 2022-02-01 MED ORDER — FENTANYL CITRATE (PF) 100 MCG/2ML IJ SOLN
INTRAMUSCULAR | Status: DC | PRN
  Administered 2022-02-01 (×2): 25 ug via INTRAVENOUS
  Administered 2022-02-01 (×2): 50 ug via INTRAVENOUS
  Administered 2022-02-01: 25 ug via INTRAVENOUS
  Administered 2022-02-01: 50 ug via INTRAVENOUS
  Administered 2022-02-01: 25 ug via INTRAVENOUS

## 2022-02-01 MED ORDER — TRIMETHOPRIM 100 MG PO TABS
100.0000 mg | ORAL_TABLET | Freq: Every day | ORAL | Status: DC
  Filled 2022-02-01: qty 1

## 2022-02-01 MED ORDER — PROPOFOL 10 MG/ML IV BOLUS
INTRAVENOUS | Status: DC | PRN
Start: 2022-02-01 — End: 2022-02-01
  Administered 2022-02-01: 150 mg via INTRAVENOUS

## 2022-02-01 MED ORDER — LORATADINE 10 MG PO TABS: 10.0000 mg | ORAL_TABLET | Freq: Every day | ORAL | Status: DC

## 2022-02-01 MED ORDER — ONDANSETRON HCL 4 MG/2ML IJ SOLN: 4.0000 mg | Freq: Four times a day (QID) | INTRAMUSCULAR | Status: DC | PRN

## 2022-02-01 MED ORDER — INSULIN ASPART 100 UNIT/ML IJ SOLN
0.0000 [IU] | Freq: Three times a day (TID) | INTRAMUSCULAR | Status: DC
  Administered 2022-02-01: 5 [IU] via SUBCUTANEOUS

## 2022-02-01 MED ORDER — POLYVINYL ALCOHOL 1.4 % OP SOLN: 1.0000 [drp] | OPHTHALMIC | Status: DC | PRN

## 2022-02-01 MED ORDER — LACTATED RINGERS IV SOLN: INTRAVENOUS | Status: DC

## 2022-02-01 MED ORDER — PRAVASTATIN SODIUM 40 MG PO TABS: 40.0000 mg | ORAL_TABLET | Freq: Every day | ORAL | Status: DC

## 2022-02-01 MED ORDER — ACETAMINOPHEN 325 MG PO TABS: 650.0000 mg | ORAL_TABLET | ORAL | Status: DC | PRN

## 2022-02-01 MED ORDER — MIDAZOLAM HCL 2 MG/2ML IJ SOLN
INTRAMUSCULAR | Status: AC
  Filled 2022-02-01: qty 2

## 2022-02-01 MED ORDER — DEXAMETHASONE SODIUM PHOSPHATE 10 MG/ML IJ SOLN
INTRAMUSCULAR | Status: DC | PRN
  Administered 2022-02-01: 5 mg via INTRAVENOUS

## 2022-02-01 MED ORDER — HYDROMORPHONE HCL 1 MG/ML IJ SOLN
INTRAMUSCULAR | Status: AC
  Filled 2022-02-01: qty 1

## 2022-02-01 MED ORDER — ORAL CARE MOUTH RINSE: 15.0000 mL | Freq: Once | OROMUCOSAL | Status: AC

## 2022-02-01 SURGICAL SUPPLY — 87 items
ADH SKN CLS APL DERMABOND .7 (GAUZE/BANDAGES/DRESSINGS) ×1
APL SKNCLS STERI-STRIP NONHPOA (GAUZE/BANDAGES/DRESSINGS) ×1
BAG COUNTER SPONGE SURGICOUNT (BAG) ×3 IMPLANT
BAG SPNG CNTER NS LX DISP (BAG) ×1
BASKET BONE COLLECTION (BASKET) ×3 IMPLANT
BENZOIN TINCTURE PRP APPL 2/3 (GAUZE/BANDAGES/DRESSINGS) ×3 IMPLANT
BIT DRILL 5.0/4.0 (BIT) IMPLANT
BLADE CLIPPER SURG (BLADE) IMPLANT
BLADE SURG 11 STRL SS (BLADE) ×3 IMPLANT
BONE VIVIGEN FORMABLE 5.4CC (Bone Implant) ×2 IMPLANT
BUR CUTTER 7.0 ROUND (BURR) ×3 IMPLANT
BUR MATCHSTICK NEURO 3.0 LAGG (BURR) ×3 IMPLANT
CANISTER SUCT 3000ML PPV (MISCELLANEOUS) ×3 IMPLANT
CAP LOCKING THREADED (Cap) ×6 IMPLANT
CARTRIDGE OIL MAESTRO DRILL (MISCELLANEOUS) ×2 IMPLANT
CLSR STERI-STRIP ANTIMIC 1/2X4 (GAUZE/BANDAGES/DRESSINGS) ×1 IMPLANT
CNTNR URN SCR LID CUP LEK RST (MISCELLANEOUS) ×2 IMPLANT
CONT SPEC 4OZ STRL OR WHT (MISCELLANEOUS) ×2
COVER BACK TABLE 60X90IN (DRAPES) ×3 IMPLANT
DECANTER SPIKE VIAL GLASS SM (MISCELLANEOUS) ×2 IMPLANT
DERMABOND ADVANCED (GAUZE/BANDAGES/DRESSINGS) ×1
DERMABOND ADVANCED .7 DNX12 (GAUZE/BANDAGES/DRESSINGS) ×2 IMPLANT
DIFFUSER DRILL AIR PNEUMATIC (MISCELLANEOUS) ×3 IMPLANT
DRAPE C-ARM 42X72 X-RAY (DRAPES) ×4 IMPLANT
DRAPE C-ARMOR (DRAPES) IMPLANT
DRAPE HALF SHEET 40X57 (DRAPES) IMPLANT
DRAPE LAPAROTOMY 100X72X124 (DRAPES) ×3 IMPLANT
DRAPE SURG 17X23 STRL (DRAPES) ×3 IMPLANT
DRILL 5.0/4.0 (BIT) ×2
DRSG OPSITE 4X5.5 SM (GAUZE/BANDAGES/DRESSINGS) ×3 IMPLANT
DRSG OPSITE POSTOP 4X6 (GAUZE/BANDAGES/DRESSINGS) ×3 IMPLANT
DRSG OPSITE POSTOP 4X8 (GAUZE/BANDAGES/DRESSINGS) ×1 IMPLANT
DURAPREP 26ML APPLICATOR (WOUND CARE) ×3 IMPLANT
ELECT BLADE 4.0 EZ CLEAN MEGAD (MISCELLANEOUS) ×2
ELECT REM PT RETURN 9FT ADLT (ELECTROSURGICAL) ×2
ELECTRODE BLDE 4.0 EZ CLN MEGD (MISCELLANEOUS) IMPLANT
ELECTRODE REM PT RTRN 9FT ADLT (ELECTROSURGICAL) ×2 IMPLANT
EVACUATOR 1/8 PVC DRAIN (DRAIN) ×1 IMPLANT
EVACUATOR 3/16  PVC DRAIN (DRAIN) ×2
EVACUATOR 3/16 PVC DRAIN (DRAIN) ×2 IMPLANT
GAUZE 4X4 16PLY ~~LOC~~+RFID DBL (SPONGE) IMPLANT
GAUZE SPONGE 4X4 12PLY STRL (GAUZE/BANDAGES/DRESSINGS) ×3 IMPLANT
GAUZE SPONGE 4X4 12PLY STRL LF (GAUZE/BANDAGES/DRESSINGS) ×1 IMPLANT
GLOVE EXAM NITRILE XL STR (GLOVE) IMPLANT
GLOVE SURG ENC MOIS LTX SZ7 (GLOVE) ×3 IMPLANT
GLOVE SURG ENC MOIS LTX SZ8 (GLOVE) ×6 IMPLANT
GLOVE SURG POLYISO LF SZ7 (GLOVE) ×1 IMPLANT
GLOVE SURG UNDER POLY LF SZ7 (GLOVE) ×3 IMPLANT
GLOVE SURG UNDER POLY LF SZ7.5 (GLOVE) ×3 IMPLANT
GLOVE SURG UNDER POLY LF SZ8.5 (GLOVE) ×6 IMPLANT
GOWN STRL REUS W/ TWL LRG LVL3 (GOWN DISPOSABLE) IMPLANT
GOWN STRL REUS W/ TWL XL LVL3 (GOWN DISPOSABLE) ×4 IMPLANT
GOWN STRL REUS W/TWL 2XL LVL3 (GOWN DISPOSABLE) IMPLANT
GOWN STRL REUS W/TWL LRG LVL3 (GOWN DISPOSABLE) ×2
GOWN STRL REUS W/TWL XL LVL3 (GOWN DISPOSABLE) ×6
GRAFT BNE MATRIX VG FRMBL MD 5 (Bone Implant) IMPLANT
GRAFT BONE PROTEIOS LRG 5CC (Orthopedic Implant) ×1 IMPLANT
KIT BASIN OR (CUSTOM PROCEDURE TRAY) ×3 IMPLANT
KIT GRAFTMAG DEL NEURO DISP (NEUROSURGERY SUPPLIES) IMPLANT
KIT POSITION SURG JACKSON T1 (MISCELLANEOUS) ×3 IMPLANT
KIT TURNOVER KIT B (KITS) ×3 IMPLANT
MILL MEDIUM DISP (BLADE) ×3 IMPLANT
NDL HYPO 21X1.5 SAFETY (NEEDLE) ×2 IMPLANT
NDL HYPO 25X1 1.5 SAFETY (NEEDLE) ×2 IMPLANT
NEEDLE HYPO 21X1.5 SAFETY (NEEDLE) ×2 IMPLANT
NEEDLE HYPO 25X1 1.5 SAFETY (NEEDLE) ×2 IMPLANT
NS IRRIG 1000ML POUR BTL (IV SOLUTION) ×3 IMPLANT
OIL CARTRIDGE MAESTRO DRILL (MISCELLANEOUS) ×2
PACK LAMINECTOMY NEURO (CUSTOM PROCEDURE TRAY) ×3 IMPLANT
PAD ARMBOARD 7.5X6 YLW CONV (MISCELLANEOUS) ×12 IMPLANT
PATTIES SURGICAL 1X1 (DISPOSABLE) ×1 IMPLANT
ROD CREO 60MM (Rod) ×2 IMPLANT
SHAFT CREO 30MM (Neuro Prosthesis/Implant) ×6 IMPLANT
SPACER SUSTAIN RT 12 8D 9X26 (Spacer) ×2 IMPLANT
SPACER SUSTAIN TI 8X26X11 8D (Spacer) ×2 IMPLANT
SPONGE SURGIFOAM ABS GEL 100 (HEMOSTASIS) ×3 IMPLANT
SPONGE T-LAP 4X18 ~~LOC~~+RFID (SPONGE) IMPLANT
STRIP CLOSURE SKIN 1/2X4 (GAUZE/BANDAGES/DRESSINGS) ×6 IMPLANT
SUT VIC AB 0 CT1 18XCR BRD8 (SUTURE) ×2 IMPLANT
SUT VIC AB 0 CT1 8-18 (SUTURE) ×2
SUT VIC AB 2-0 CT1 18 (SUTURE) ×3 IMPLANT
SUT VIC AB 4-0 PS2 27 (SUTURE) ×3 IMPLANT
SYR 20ML LL LF (SYRINGE) ×3 IMPLANT
TOWEL GREEN STERILE (TOWEL DISPOSABLE) ×3 IMPLANT
TOWEL GREEN STERILE FF (TOWEL DISPOSABLE) ×3 IMPLANT
TRAY FOLEY MTR SLVR 16FR STAT (SET/KITS/TRAYS/PACK) ×3 IMPLANT
WATER STERILE IRR 1000ML POUR (IV SOLUTION) ×3 IMPLANT

## 2022-02-01 NOTE — Transfer of Care (Signed)
Immediate Anesthesia Transfer of Care Note  Patient: Diane Mayo  Procedure(s) Performed: Posterior Lumbar Iinterbody Fusion - Lumbar three-Lumbar four - Lumbar four-Lumbar five - Interbody Fusion (Spine Lumbar)  Patient Location: PACU  Anesthesia Type:General  Level of Consciousness: drowsy  Airway & Oxygen Therapy: Patient Spontanous Breathing and Patient connected to face mask oxygen  Post-op Assessment: Report given to RN and Post -op Vital signs reviewed and stable  Post vital signs: Reviewed and stable  Last Vitals:  Vitals Value Taken Time  BP 130/66 02/01/22 1248  Temp    Pulse 74 02/01/22 1251  Resp 16 02/01/22 1251  SpO2 98 % 02/01/22 1251  Vitals shown include unvalidated device data.  Last Pain:  Vitals:   02/01/22 0706  TempSrc: Oral         Complications: No notable events documented.

## 2022-02-01 NOTE — Op Note (Signed)
Preoperative diagnosis: Severe lumbar spinal stenosis L3-4 L4-5 bilateral L3, L4, L5 radiculopathies and neurogenic claudication  Postoperative diagnosis: Same  Procedure: #1 decompressive lumbar laminectomy L3-4 L4-5 with complete medial facetectomies erotic foraminotomies in excess and requiring more work than would be needed with a standard interbody fusion.  2.  Posterior lumbar interbody fusion utilizing the globus titanium insert and rotate cages packed with locally harvested autograft mixed with vivigen and protios  #3 cortical screw fixation L3-L5 utilizing the globus Creo modular cortical screw set  Surgeon: Dominica Severin Charels Stambaugh  Assistant: Ashok Pall  Anesthesia: General  EBL: 64 L  HPI: 71 year old female with longstanding back pain bilateral hip and leg pain neurogenic claudication work-up revealed severe spinal stenosis at L3-4 L4-5 and due to her failed conservative treatment imaging findings and progression of clinical syndrome I recommended decompressive laminectomy interbody fusion at those levels.  I extensively went over the risks and benefits of that operation with her as well as perioperative course expectations of outcome and alternatives of surgery and she understood and agreed to proceed forward.  Operative procedure: Patient was brought into the OR was [facility] general anesthesia positioned prone the Wilson frame her back was prepped and draped in routine sterile fashion.  After infiltration of 10 cc lidocaine with epi midline incision made and Bovie letter cautery was used to take down the subcutaneous tissue and subperiosteal dissection was carried out lamina of L2, L3, L4 and L5 bilaterally.  Intraoperative x-ray confirmed identification appropriate level.  So the spinous process at L3 and L4 was removed facet joints were drilled down capturing the bone shavings and mucus trap central decompression was begun there was marked hourglass compression of thecal sac at both L3-4 and L4-5  but I did a complete medial facetectomy blew through the pars at both levels and aggressively under bit the superior tickling facet to widely decompress the spinal canal performed foraminotomies of the L3, L4 and L5 nerve roots and after adequate decompression both centrally and foraminally was achieved, into the disc base and cleaned out the disc space bilaterally.  Utilizing sequential distraction proceeded to retain continue to clean out the disc and prepared the endplates and Y7-0 placed 8 x 11 8 degree titanium cages packed with the locally harvested autograft mixed with an extensive mount of autograft mix packed centrally.  And at L3-4 utilizing utilizing 9 x 12 titanium cages with extensive mount of autograft mix centrally as well.  After all the interbody work been done attention taken to cortical screw placement under fluoroscopy placed 6 cortical screws in routine fashion.  All screws had excellent purchase and postop imaging confirmed good position of all the implants.  Wounds are copiously irrigated to Kassim states was maintained I inspected the foramina to confirm patency and wide decompression leg Gelfoam and top of the dura I assembled the heads compressed L4 against L5 and L3 against L4 and anchored everything in place.  Placed a medium Hemovac drain injected Exparel in the fascia and closed the wound in layers with interrupted Vicryl.  Placed a running 4-0 subcuticular in the skin.  Dermabond benzoin Steri-Strips and sterile dressing was applied patient recovery in stable condition.  At the end the case all needle count sponge counts were correct.

## 2022-02-01 NOTE — Anesthesia Procedure Notes (Signed)
Procedure Name: Intubation Date/Time: 02/01/2022 8:32 AM Performed by: Theresa Mulligan, RN Pre-anesthesia Checklist: Patient identified, Emergency Drugs available, Suction available and Patient being monitored Patient Re-evaluated:Patient Re-evaluated prior to induction Oxygen Delivery Method: Circle System Utilized Preoxygenation: Pre-oxygenation with 100% oxygen Induction Type: IV induction Ventilation: Mask ventilation without difficulty Laryngoscope Size: Mac and 4 Grade View: Grade I Tube type: Oral Tube size: 7.5 mm Number of attempts: 1 Airway Equipment and Method: Stylet and Oral airway Placement Confirmation: ETT inserted through vocal cords under direct vision, positive ETCO2 and breath sounds checked- equal and bilateral Tube secured with: Tape Dental Injury: Teeth and Oropharynx as per pre-operative assessment

## 2022-02-01 NOTE — H&P (Signed)
Diane Mayo is an 71 y.o. female.   Chief Complaint: Back and left greater than right leg pain HPI: 71 year old female with longstanding back and bilateral hip and leg pain worse on the left work-up revealed severe spinal stenosis at L3-4 and L4-5 in the setting of a transitional disc base that would not call S1-S2.  Due to her failed conservative treatment imaging findings and progression of clinical syndrome I recommended decompressive laminectomy and interbody fusion at those 2 levels.  I extensively reviewed the risks and benefits of the operation with the patient as well as perioperative course expectations of outcome and alternatives of surgery and she understands and agrees to proceed forward.  Past Medical History:  Diagnosis Date   Diabetes mellitus without complication (HCC)    Type 2   Genital herpes    GERD (gastroesophageal reflux disease)    Hypertension    Interstitial cystitis    Left bundle branch block (LBBB)    chronic   Leukocytosis    Osteoarthritis    Plantar fasciitis     Past Surgical History:  Procedure Laterality Date   CARPAL TUNNEL RELEASE Bilateral    CESAREAN SECTION     twice 1978 and [place]   EYE SURGERY Bilateral 2021   cataracts removed   SHOULDER SURGERY Right 2012   rotator cuff repair   TUBAL LIGATION  1987    Family History  Problem Relation Age of Onset   Hypertension Mother    Diabetes Mother    Other Mother        hay fever   Pneumonia Mother        aspiration   Gout Father    Skin cancer Father    Heart attack Father    Kidney failure Father    Hypertension Father    CVA Father    CAD Father    Asthma Brother    Other Brother        hay fever   Diabetes type II Maternal Grandfather    Alcoholism Brother    Social History:  reports that she has never smoked. She has never used smokeless tobacco. She reports current alcohol use. She reports that she does not use drugs.  Allergies:   Allergies  Allergen Reactions   Lisinopril Other (See Comments)   Miconazole Itching and Swelling    Monistat    Penicillins Itching and Rash    Medications Prior to Admission  Medication Sig Dispense Refill   acetaminophen (TYLENOL) 500 MG tablet Take 1,000 mg by mouth every 6 (six) hours as needed.     allopurinol (ZYLOPRIM) 300 MG tablet Take 300 mg by mouth daily.     amLODipine (NORVASC) 10 MG tablet Take 10 mg by mouth daily.     celecoxib (CELEBREX) 200 MG capsule Take 200 mg by mouth daily.     cetirizine (ZYRTEC) 10 MG tablet Take 10 mg by mouth daily.     hydrochlorothiazide (MICROZIDE) 12.5 MG capsule Take 12.5 mg by mouth daily.     irbesartan (AVAPRO) 300 MG tablet TAKE 1 TABLET BY MOUTH DAILY 90 tablet 3   metFORMIN (GLUCOPHAGE) 1000 MG tablet Take 1,000 mg by mouth 2 (two) times daily with a meal.     metoprolol tartrate (LOPRESSOR) 25 MG tablet Take 75 mg by mouth 2 (two) times daily.     Multiple Vitamins-Minerals (EMERGEN-C IMMUNE PLUS) PACK Take 1 packet by mouth daily.  nystatin cream (MYCOSTATIN) Apply 1 application topically daily as needed for rash.     omeprazole (PRILOSEC) 40 MG capsule Take 40 mg by mouth daily.     Pentosan Polysulfate Sodium 200 MG CPDR Take 200 mg by mouth 2 (two) times daily.     polyvinyl alcohol (LIQUIFILM TEARS) 1.4 % ophthalmic solution Place 1 drop into both eyes as needed for dry eyes.     pravastatin (PRAVACHOL) 40 MG tablet Take 40 mg by mouth daily.     trimethoprim (TRIMPEX) 100 MG tablet Take 100 mg by mouth daily.     vitamin B-12 (CYANOCOBALAMIN) 1000 MCG tablet Take 1,000 mcg by mouth daily.     colchicine 0.6 MG tablet TAKE 1 TABLET BY MOUTH TWICE A DAY (Patient taking differently: Take 0.6 mg by mouth 2 (two) times daily as needed (gout).) 30 tablet 0    Results for orders placed or performed during the hospital encounter of 02/01/22 (from the past 48 hour(s))  Glucose, capillary     Status: Abnormal   Collection  Time: 02/01/22  7:10 AM  Result Value Ref Range   Glucose-Capillary 154 (H) 70 - 99 mg/dL    Comment: Glucose reference range applies only to samples taken after fasting for at least 8 hours.   No results found.  Review of Systems  Musculoskeletal:  Positive for back pain.  Neurological:  Positive for numbness.   Blood pressure 135/65, pulse 68, temperature 98.4 F (36.9 C), temperature source Oral, resp. rate 18, height 5' 1.5" (1.562 m), weight 99.8 kg, SpO2 97 %. Physical Exam HENT:     Head: Normocephalic.     Right Ear: Tympanic membrane normal.     Nose: Nose normal.     Mouth/Throat:     Mouth: Mucous membranes are moist.  Eyes:     Pupils: Pupils are equal, round, and reactive to light.  Cardiovascular:     Rate and Rhythm: Normal rate.  Abdominal:     General: Abdomen is flat.  Musculoskeletal:        General: Normal range of motion.     Cervical back: Normal range of motion.  Neurological:     Mental Status: She is alert.     Comments: Patient is awake and alert strength is 5 out of 5 iliopsoas, quads, hamstrings, gastrocs, into tibialis, and EHL.     Assessment/Plan 71-year female presents for two-level decompressive laminectomy interbody fusion L3-4 L4-5  Elaina Hoops, MD 02/01/2022, 8:21 AM

## 2022-02-01 NOTE — Addendum Note (Signed)
Addendum  created 02/01/22 1429 by Eligha Bridegroom, CRNA   Intraprocedure Meds edited

## 2022-02-01 NOTE — Anesthesia Postprocedure Evaluation (Signed)
Anesthesia Post Note  Patient: Diane Mayo  Procedure(s) Performed: Posterior Lumbar Iinterbody Fusion - Lumbar three-Lumbar four - Lumbar four-Lumbar five - Interbody Fusion (Spine Lumbar)     Patient location during evaluation: PACU Anesthesia Type: General Level of consciousness: awake and alert, oriented and patient cooperative Pain management: pain level controlled Vital Signs Assessment: post-procedure vital signs reviewed and stable Respiratory status: spontaneous breathing, nonlabored ventilation and respiratory function stable Cardiovascular status: blood pressure returned to baseline and stable Postop Assessment: no apparent nausea or vomiting Anesthetic complications: no   No notable events documented.  Last Vitals:  Vitals:   02/01/22 1335 02/01/22 1417  BP: (!) 99/49 (!) 105/49  Pulse: 67 75  Resp: 14 18  Temp: 36.7 C 36.9 C  SpO2: 92% 96%    Last Pain:  Vitals:   02/01/22 1417  TempSrc: Oral  PainSc:                  Pervis Hocking

## 2022-02-02 LAB — GLUCOSE, CAPILLARY: Glucose-Capillary: 170 mg/dL — ABNORMAL HIGH (ref 70–99)

## 2022-02-02 MED ORDER — CYCLOBENZAPRINE HCL 10 MG PO TABS: 10.0000 mg | ORAL_TABLET | Freq: Three times a day (TID) | ORAL | 0 refills | Status: DC | PRN

## 2022-02-02 MED ORDER — HYDROCODONE-ACETAMINOPHEN 5-325 MG PO TABS: 1.0000 | ORAL_TABLET | ORAL | 0 refills | Status: DC | PRN

## 2022-02-02 NOTE — Discharge Instructions (Signed)
Wound Care  Keep the incision clean and dry remove the outer dressing in 3 days,  Do not put any creams, lotions, or ointments on incision. Leave steri-strips on back.  They will fall off by themselves.  Activity Walk each and every day, increasing distance each day. No lifting greater than 5 lbs.  No lifting no bending no twisting no driving or riding a car unless coming back and forth to see me. If provided with back brace, wear when out of bed.  It is not necessary to wear brace in bed. Diet Resume your normal diet.    Call Your Doctor If Any of These Occur Redness, drainage, or swelling at the wound.  Temperature greater than 101 degrees. Severe pain not relieved by pain medication. Incision starts to come apart. Follow Up Appt Call today for appointment in 1-2 weeks ([phone]) or for problems.  If you have any hardware placed in your spine, you will need an x-ray before your appointment.

## 2022-02-02 NOTE — Evaluation (Signed)
Physical Therapy Evaluation and Discharge Patient Details Name: Diane Mayo MRN: [id number] DOB: 1951-04-22 Today's Date: 02/02/2022  History of Present Illness  Pt is a 71 year old woman admitted on 02/01/22 for L3-5 PLIF. PMH: DM2, HTN, OA, LBBB, obesity.  Clinical Impression  Patient evaluated by Physical Therapy with no further acute PT needs identified. All education has been completed and the patient has no further questions.  See below for any follow-up Physical Therapy or equipment needs. PT is signing off. Thank you for this referral.        Recommendations for follow up therapy are one component of a multi-disciplinary discharge planning process, led by the attending physician.  Recommendations may be updated based on patient status, additional functional criteria and insurance authorization.  Follow Up Recommendations Home health PT    Assistance Recommended at Discharge Set up Supervision/Assistance  Patient can return home with the following  Help with stairs or ramp for entrance;Assistance with cooking/housework    Equipment Recommendations None recommended by PT  Recommendations for Other Services       Functional Status Assessment Patient has had a recent decline in their functional status and demonstrates the ability to make significant improvements in function in a reasonable and predictable amount of time.     Precautions / Restrictions Precautions Precautions: Back Precaution Booklet Issued: Yes (comment) Restrictions Other Position/Activity Restrictions: no brace needed, pt asking about her lumbar corset, and I opted to demonstrate donning to pt and husband      Mobility  Bed Mobility Overal bed mobility: Needs Assistance Bed Mobility: Rolling, Sidelying to Sit Rolling: Min guard Sidelying to sit: Min guard       General bed mobility comments: Good log roll technique     Transfers Overall transfer level: Modified independent Equipment used:  Rolling walker (2 wheels)               General transfer comment: good technique/hand placement    Ambulation/Gait Ambulation/Gait assistance: Min guard, Supervision Gait Distance (Feet): 100 Feet (with a few standing rest breaks) Assistive device: Rolling walker (2 wheels) Gait Pattern/deviations: Step-through pattern, Decreased step length - right, Decreased step length - left Gait velocity: slow     General Gait Details: Noted DOE 2-3/4; Cues to self-monitor for activity tolerance   Stairs Stairs: Yes Stairs assistance: Min assist Stair Management: One rail Right, Step to pattern, Forwards, Sideways Number of Stairs: 3 General stair comments: Cues for technique; demonstrated forwards and sideways; pt performed ascending with rail on r and handheld assist L; Preffered sideways technique  with 2 hands on rail to descend; Discussed and demonstrated technique  with her husband  Wheelchair Mobility    Modified Rankin (Stroke Patients Only)       Balance     Sitting balance-Leahy Scale: Good       Standing balance-Leahy Scale: Fair                               Pertinent Vitals/Pain Pain Assessment Pain Assessment: Faces Faces Pain Scale: Hurts little more Pain Location: back Pain Descriptors / Indicators: Sore Pain Intervention(s): Monitored during session    Home Living Family/patient expects to be discharged to:: Private residence Living Arrangements: Spouse/significant other Available Help at Discharge: Family;Available 24 hours/day Type of Home: House Home Access: Stairs to enter Entrance Stairs-Rails: Right Entrance Stairs-Number of Steps: 3   Home Layout: One level Home Equipment: Wheelchair -  manual;Rolling Walker (2 wheels);Cane - single point;Shower seat;Hand held shower head;Grab bars - tub/shower;Adaptive equipment      Prior Function Prior Level of Function : Needs assist             Mobility Comments: walking with a  cane ADLs Comments: has a housekeeper for heavy cleaning, works with husband on IADLs, husband helps with LB dressing, but pt has AE from deceased family members     Hand Dominance   Dominant Hand: Right    Extremity/Trunk Assessment   Upper Extremity Assessment Upper Extremity Assessment: Defer to OT evaluation    Lower Extremity Assessment Lower Extremity Assessment: Generalized weakness (Dependent on UE push/support on rails to ascend steps)    Cervical / Trunk Assessment Cervical / Trunk Assessment: Back Surgery  Communication   Communication: No difficulties  Cognition Arousal/Alertness: Awake/alert Behavior During Therapy: WFL for tasks assessed/performed Overall Cognitive Status: Within Functional Limits for tasks assessed                                          General Comments General comments (skin integrity, edema, etc.): Discussed home setup and that pt does not walk very far at baseline; recommend she has a seated rest break at top of stairs    Exercises     Assessment/Plan    PT Assessment All further PT needs can be met in the next venue of care  PT Problem List Decreased strength;Decreased activity tolerance;Decreased balance;Decreased mobility;Decreased knowledge of use of DME;Decreased knowledge of precautions;Cardiopulmonary status limiting activity       PT Treatment Interventions      PT Goals (Current goals can be found in the Care Plan section)  Acute Rehab PT Goals Patient Stated Goal: Home today PT Goal Formulation: All assessment and education complete, DC therapy    Frequency       Co-evaluation               AM-PAC PT "6 Clicks" Mobility  Outcome Measure Help needed turning from your back to your side while in a flat bed without using bedrails?: None Help needed moving from lying on your back to sitting on the side of a flat bed without using bedrails?: None Help needed moving to and from a bed to a chair  (including a wheelchair)?: A Little Help needed standing up from a chair using your arms (e.g., wheelchair or bedside chair)?: A Little Help needed to walk in hospital room?: A Little Help needed climbing 3-5 steps with a railing? : A Lot 6 Click Score: 19    End of Session Equipment Utilized During Treatment: Back brace Activity Tolerance: Patient tolerated treatment well Patient left: Other (comment) (Working with OT) Nurse Communication: Mobility status PT Visit Diagnosis: Unsteadiness on feet (R26.81);Other abnormalities of gait and mobility (R26.89)    Time: 7588-3254 PT Time Calculation (min) (ACUTE ONLY): 30 min   Charges:   PT Evaluation $PT Eval Low Complexity: 1 Low PT Treatments $Gait Training: 8-22 mins        Roney Marion, PT  Acute Rehabilitation Services Pager [phone] Office [phone]   Colletta [place] 02/02/2022, 10:09 AM

## 2022-02-02 NOTE — TOC Progression Note (Addendum)
Transition of Care [facility]) - Progression Note    Patient Details  Name: Diane Mayo MRN: [id number] Date of Birth: 08/20/1951  [facility]) CM/SW Contact  Onia Shiflett, Edson Snowball, RN Phone Number: 02/02/2022, 11:17 AM  Clinical Narrative:        Recommendation for HHPT, however per nurse Dr Saintclair Halsted wants to wait until follow up appointment.   Per Donne Anon with UR not a code 45, approved for inpatient  Transition of Care [facility]) Screening Note   Patient Details  Name: Diane Mayo Date of Birth: February 21, 1951      Transition of Care Department [facility]) has reviewed patient and no TOC needs have been identified at this time. We will continue to monitor patient advancement through interdisciplinary progression rounds. If new patient transition needs arise, please place a TOC consult.      Expected Discharge Plan and Services           Expected Discharge Date: 02/02/22                                     Social Determinants of Health (SDOH) Interventions    Readmission Risk Interventions No flowsheet data found.

## 2022-02-02 NOTE — Progress Notes (Signed)
Patient alert and oriented, mae's well, voiding adequate amount of urine, swallowing without difficulty, no c/o pain at time of discharge. Patient discharged home with family. Script and discharged instructions given to patient. Patient and family stated understanding of instructions given. Patient has an appointment with Dr. Cram 

## 2022-02-02 NOTE — Discharge Summary (Signed)
Physician Discharge Summary  Patient ID: Diane Mayo MRN: [id number] DOB/AGE: 07-26-1951 71 y.o.  Admit date: 02/01/2022 Discharge date: 02/02/2022  Admission Diagnoses: Severe lumbar spinal stenosis L3-4 L4-5 bilateral L3, L4, L5 radiculopathies and neurogenic claudication    Discharge Diagnoses: same   Discharged Condition: good  Hospital Course: The patient was admitted on 02/01/2022 and taken to the operating room where the patient underwent PLIF L3-L5. The patient tolerated the procedure well and was taken to the recovery room and then to the floor in stable condition. The hospital course was routine. There were no complications. The wound remained clean dry and intact. Pt had appropriate back soreness. No complaints of leg pain or new N/T/W. The patient remained afebrile with stable vital signs, and tolerated a regular diet. The patient continued to increase activities, and pain was well controlled with oral pain medications.   Consults: None  Significant Diagnostic Studies:  Results for orders placed or performed during the hospital encounter of 02/01/22  Glucose, capillary  Result Value Ref Range   Glucose-Capillary 154 (H) 70 - 99 mg/dL  Glucose, capillary  Result Value Ref Range   Glucose-Capillary 168 (H) 70 - 99 mg/dL  Glucose, capillary  Result Value Ref Range   Glucose-Capillary 192 (H) 70 - 99 mg/dL  Glucose, capillary  Result Value Ref Range   Glucose-Capillary 191 (H) 70 - 99 mg/dL  Glucose, capillary  Result Value Ref Range   Glucose-Capillary 250 (H) 70 - 99 mg/dL  Glucose, capillary  Result Value Ref Range   Glucose-Capillary 213 (H) 70 - 99 mg/dL   Comment 1 Notify RN    Comment 2 Document in Chart   Glucose, capillary  Result Value Ref Range   Glucose-Capillary 170 (H) 70 - 99 mg/dL   Comment 1 Notify RN    Comment 2 Document in Chart   ABO/Rh  Result Value Ref Range   ABO/RH(D)      O POS Performed at [facility], [address],  [place], [place] [address]     DG Lumbar Spine 2-3 Views  Result Date: 02/01/2022 CLINICAL DATA:  Lumbar fusion EXAM: LUMBAR SPINE - 2-3 VIEW; DG C-ARM 1-60 MIN-NO REPORT COMPARISON:  11/09/2021 FINDINGS: 2 C-arm fluoroscopic images were obtained intraoperatively and submitted for post operative interpretation. Transpedicular screws are present bilaterally within the L3, L4, and L5 vertebrae with interbody spacers at L3-4 and L4-5. 67 seconds of fluoroscopy time was utilized. Please see the performing provider's procedural report for further detail. IMPRESSION: As above. Electronically Signed   By: Davina Poke D.O.   On: 02/01/2022 12:56   DG C-Arm 1-60 Min-No Report  Result Date: 02/01/2022 Fluoroscopy was utilized by the requesting physician.  No radiographic interpretation.   DG C-Arm 1-60 Min-No Report  Result Date: 02/01/2022 Fluoroscopy was utilized by the requesting physician.  No radiographic interpretation.   DG C-Arm 1-60 Min-No Report  Result Date: 02/01/2022 Fluoroscopy was utilized by the requesting physician.  No radiographic interpretation.    Antibiotics:  Anti-infectives (From admission, onward)    Start     Dose/Rate Route Frequency Ordered Stop   02/02/22 1000  trimethoprim (TRIMPEX) tablet 100 mg        100 mg Oral Daily 02/01/22 1410     02/01/22 1920  ceFAZolin (ANCEF) IVPB 2g/100 mL premix        2 g 200 mL/hr over 30 Minutes Intravenous Every 8 hours 02/01/22 1339 02/03/22 1929   02/01/22 0645  vancomycin (VANCOCIN) IVPB 1000 mg/200 mL premix        1,000 mg 200 mL/hr over 60 Minutes Intravenous On call to O.R. 02/01/22 7026 02/01/22 0855       Discharge Exam: Blood pressure (!) 155/58, pulse (!) 108, temperature 98.5 F (36.9 C), resp. rate 16, height 5' 1.5" (1.562 m), weight 99.8 kg, SpO2 100 %. Neurologic: Grossly normal Ambulating and voiding well incision cdi  Discharge Medications:   Allergies as of 02/02/2022       Reactions   Lisinopril Other  (See Comments)   Miconazole Itching, Swelling   Monistat    Penicillins Itching, Rash        Medication List     TAKE these medications    acetaminophen 500 MG tablet Commonly known as: TYLENOL Take 1,000 mg by mouth every 6 (six) hours as needed.   allopurinol 300 MG tablet Commonly known as: ZYLOPRIM Take 300 mg by mouth daily.   amLODipine 10 MG tablet Commonly known as: NORVASC Take 10 mg by mouth daily.   celecoxib 200 MG capsule Commonly known as: CELEBREX Take 200 mg by mouth daily.   cetirizine 10 MG tablet Commonly known as: ZYRTEC Take 10 mg by mouth daily.   colchicine 0.6 MG tablet TAKE 1 TABLET BY MOUTH TWICE A DAY What changed:  when to take this reasons to take this   cyclobenzaprine 10 MG tablet Commonly known as: FLEXERIL Take 1 tablet (10 mg total) by mouth 3 (three) times daily as needed for muscle spasms.   Emergen-C Immune Plus Pack Take 1 packet by mouth daily.   hydrochlorothiazide 12.5 MG capsule Commonly known as: MICROZIDE Take 12.5 mg by mouth daily.   HYDROcodone-acetaminophen 5-325 MG tablet Commonly known as: NORCO/VICODIN Take 1-2 tablets by mouth every 4 (four) hours as needed for severe pain ((score 7 to 10)).   irbesartan 300 MG tablet Commonly known as: AVAPRO TAKE 1 TABLET BY MOUTH DAILY   metFORMIN 1000 MG tablet Commonly known as: GLUCOPHAGE Take 1,000 mg by mouth 2 (two) times daily with a meal.   metoprolol tartrate 25 MG tablet Commonly known as: LOPRESSOR Take 75 mg by mouth 2 (two) times daily.   nystatin cream Commonly known as: MYCOSTATIN Apply 1 application topically daily as needed for rash.   omeprazole 40 MG capsule Commonly known as: PRILOSEC Take 40 mg by mouth daily.   Pentosan Polysulfate Sodium 200 MG Cpdr Take 200 mg by mouth 2 (two) times daily.   polyvinyl alcohol 1.4 % ophthalmic solution Commonly known as: LIQUIFILM TEARS Place 1 drop into both eyes as needed for dry eyes.    pravastatin 40 MG tablet Commonly known as: PRAVACHOL Take 40 mg by mouth daily.   trimethoprim 100 MG tablet Commonly known as: TRIMPEX Take 100 mg by mouth daily.   vitamin B-12 1000 MCG tablet Commonly known as: CYANOCOBALAMIN Take 1,000 mcg by mouth daily.        Disposition: home   Final Dx: PLIF L3-L5  Discharge Instructions      Remove dressing in 72 hours   Complete by: As directed    Call MD for:  difficulty breathing, headache or visual disturbances   Complete by: As directed    Call MD for:  hives   Complete by: As directed    Call MD for:  persistant nausea and vomiting   Complete by: As directed    Call MD for:  redness, tenderness, or signs of infection (pain,  swelling, redness, odor or green/yellow discharge around incision site)   Complete by: As directed    Call MD for:  severe uncontrolled pain   Complete by: As directed    Call MD for:  temperature >100.4   Complete by: As directed    Diet - low sodium heart healthy   Complete by: As directed    Driving Restrictions   Complete by: As directed    No driving for 2 weeks, no riding in the car for 1 week   Increase activity slowly   Complete by: As directed    Lifting restrictions   Complete by: As directed    No lifting more than 8 lbs          Signed: Ocie Cornfield Allyssia Skluzacek 02/02/2022, 7:50 AM

## 2022-02-02 NOTE — Evaluation (Signed)
Occupational Therapy Evaluation and Discharge Patient Details Name: Diane Mayo MRN: [id number] DOB: [birth year] Today's Date: 02/02/2022   History of Present Illness Pt is a 71 year old woman admitted on 02/01/22 for L3-5 PLIF. PMH: DM2, HTN, OA, LBBB, obesity.   Clinical Impression   Educated pt and husband in back precautions related to ADLs and IADLs, compensatory strategies and IADLs to avoid. They verbalized and/or demonstrated understanding. No further OT needs.      Recommendations for follow up therapy are one component of a multi-disciplinary discharge planning process, led by the attending physician.  Recommendations may be updated based on patient status, additional functional criteria and insurance authorization.   Follow Up Recommendations  No OT follow up    Assistance Recommended at Discharge None  Patient can return home with the following Assistance with cooking/housework;Assist for transportation;Help with stairs or ramp for entrance    Functional Status Assessment  Patient has had a recent decline in their functional status and demonstrates the ability to make significant improvements in function in a reasonable and predictable amount of time.  Equipment Recommendations  None recommended by OT    Recommendations for Other Services       Precautions / Restrictions Precautions Precautions: Back Precaution Booklet Issued: Yes (comment) Restrictions Other Position/Activity Restrictions: no brace needed, pt wearing lumbar corsett      Mobility Bed Mobility               General bed mobility comments: pt received in hall    Transfers Overall transfer level: Modified independent Equipment used: Rolling walker (2 wheels)               General transfer comment: good technique/hand placement      Balance Overall balance assessment: Needs assistance   Sitting balance-Leahy Scale: Good       Standing balance-Leahy Scale: Fair                              ADL either performed or assessed with clinical judgement   ADL Overall ADL's : Needs assistance/impaired Eating/Feeding: Independent                                           Vision Patient Visual Report: No change from baseline Vision Assessment?: No apparent visual deficits     Perception     Praxis      Pertinent Vitals/Pain Pain Assessment Pain Assessment: Faces Faces Pain Scale: Hurts little more Pain Location: back Pain Descriptors / Indicators: Sore Pain Intervention(s): Monitored during session, Repositioned, Premedicated before session     Hand Dominance Right   Extremity/Trunk Assessment Upper Extremity Assessment Upper Extremity Assessment: Overall WFL for tasks assessed (arthritic changes in hands)   Lower Extremity Assessment Lower Extremity Assessment: Defer to PT evaluation   Cervical / Trunk Assessment Cervical / Trunk Assessment: Back Surgery   Communication Communication Communication: No difficulties   Cognition Arousal/Alertness: Awake/alert Behavior During Therapy: WFL for tasks assessed/performed Overall Cognitive Status: Within Functional Limits for tasks assessed                                       General Comments       Exercises     Shoulder  Instructions      Home Living Family/patient expects to be discharged to:: Private residence Living Arrangements: Spouse/significant other Available Help at Discharge: Family;Available 24 hours/day Type of Home: House Home Access: Stairs to enter [employer] of Steps: 3   Home Layout: One level     Bathroom Shower/Tub: Occupational psychologist: Handicapped height     Home Equipment: Wheelchair - Publishing copy (2 wheels);Cane - single point;Shower seat;Hand held shower head;Grab bars - tub/shower;Adaptive equipment Adaptive Equipment: Reacher;Sock aid        Prior Functioning/Environment Prior  Level of Function : Needs assist             Mobility Comments: walking with a cane ADLs Comments: has a housekeeper for heavy cleaning, works with husband on IADLs, husband helps with LB dressing, but pt has AE from deceased family members        OT Problem List:        OT Treatment/Interventions:      OT Goals(Current goals can be found in the care plan section)    OT Frequency:      Co-evaluation              AM-PAC OT "6 Clicks" Daily Activity     Outcome Measure Help from another person eating meals?: None Help from another person taking care of personal grooming?: None Help from another person toileting, which includes using toliet, bedpan, or urinal?: None Help from another person bathing (including washing, rinsing, drying)?: A Little Help from another person to put on and taking off regular upper body clothing?: None Help from another person to put on and taking off regular lower body clothing?: A Little 6 Click Score: 22   End of Session Equipment Utilized During Treatment: Rolling walker (2 wheels);Back brace  Activity Tolerance: Patient tolerated treatment well Patient left: in chair;with call bell/phone within reach;with family/visitor present  OT Visit Diagnosis: Pain;Other abnormalities of gait and mobility (R26.89)                Time: 3748-2707 OT Time Calculation (min): 23 min Charges:  OT General Charges $OT Visit: 1 Visit OT Evaluation $OT Eval Low Complexity: 1 Low OT Treatments $Self Care/Home Management : 8-22 mins  Nestor Lewandowsky, OTR/L Acute Rehabilitation Services Pager: [phone] Office: [phone]  Malka So 02/02/2022, 9:45 AM

## 2022-02-03 ENCOUNTER — Encounter (HOSPITAL_COMMUNITY): Payer: Self-pay | Admitting: Neurosurgery

## 2022-02-03 ENCOUNTER — Other Ambulatory Visit: Payer: Self-pay

## 2022-02-03 ENCOUNTER — Inpatient Hospital Stay (HOSPITAL_COMMUNITY)
Admission: EM | Admit: 2022-02-03 | Discharge: 2022-02-07 | DRG: 871 | Disposition: A | Discharge status: Home Health | Payer: PPO | Attending: Internal Medicine | Admitting: Internal Medicine

## 2022-02-03 DIAGNOSIS — Z833 Family history of diabetes mellitus: Secondary | ICD-10-CM | POA: Diagnosis not present

## 2022-02-03 DIAGNOSIS — M109 Gout, unspecified: Secondary | ICD-10-CM | POA: Diagnosis present

## 2022-02-03 DIAGNOSIS — D62 Acute posthemorrhagic anemia: Secondary | ICD-10-CM | POA: Diagnosis present

## 2022-02-03 DIAGNOSIS — I517 Cardiomegaly: Secondary | ICD-10-CM | POA: Diagnosis not present

## 2022-02-03 DIAGNOSIS — I509 Heart failure, unspecified: Secondary | ICD-10-CM | POA: Diagnosis not present

## 2022-02-03 DIAGNOSIS — Z6841 Body Mass Index (BMI) 40.0 and over, adult: Secondary | ICD-10-CM | POA: Diagnosis not present

## 2022-02-03 DIAGNOSIS — R0902 Hypoxemia: Secondary | ICD-10-CM

## 2022-02-03 DIAGNOSIS — R0602 Shortness of breath: Secondary | ICD-10-CM

## 2022-02-03 DIAGNOSIS — R079 Chest pain, unspecified: Secondary | ICD-10-CM | POA: Diagnosis not present

## 2022-02-03 DIAGNOSIS — I340 Nonrheumatic mitral (valve) insufficiency: Secondary | ICD-10-CM | POA: Diagnosis present

## 2022-02-03 DIAGNOSIS — Z888 Allergy status to other drugs, medicaments and biological substances status: Secondary | ICD-10-CM | POA: Diagnosis not present

## 2022-02-03 DIAGNOSIS — R509 Fever, unspecified: Secondary | ICD-10-CM

## 2022-02-03 DIAGNOSIS — I110 Hypertensive heart disease with heart failure: Secondary | ICD-10-CM | POA: Diagnosis present

## 2022-02-03 DIAGNOSIS — K219 Gastro-esophageal reflux disease without esophagitis: Secondary | ICD-10-CM | POA: Diagnosis present

## 2022-02-03 DIAGNOSIS — J9601 Acute respiratory failure with hypoxia: Secondary | ICD-10-CM | POA: Diagnosis present

## 2022-02-03 DIAGNOSIS — J90 Pleural effusion, not elsewhere classified: Secondary | ICD-10-CM | POA: Diagnosis not present

## 2022-02-03 DIAGNOSIS — I5043 Acute on chronic combined systolic (congestive) and diastolic (congestive) heart failure: Secondary | ICD-10-CM | POA: Diagnosis present

## 2022-02-03 DIAGNOSIS — E1165 Type 2 diabetes mellitus with hyperglycemia: Secondary | ICD-10-CM | POA: Diagnosis present

## 2022-02-03 DIAGNOSIS — I447 Left bundle-branch block, unspecified: Secondary | ICD-10-CM | POA: Diagnosis present

## 2022-02-03 DIAGNOSIS — R6520 Severe sepsis without septic shock: Secondary | ICD-10-CM | POA: Diagnosis present

## 2022-02-03 DIAGNOSIS — R109 Unspecified abdominal pain: Secondary | ICD-10-CM | POA: Diagnosis not present

## 2022-02-03 DIAGNOSIS — J810 Acute pulmonary edema: Secondary | ICD-10-CM

## 2022-02-03 DIAGNOSIS — E119 Type 2 diabetes mellitus without complications: Secondary | ICD-10-CM | POA: Diagnosis present

## 2022-02-03 DIAGNOSIS — E785 Hyperlipidemia, unspecified: Secondary | ICD-10-CM | POA: Diagnosis present

## 2022-02-03 DIAGNOSIS — Z7984 Long term (current) use of oral hypoglycemic drugs: Secondary | ICD-10-CM | POA: Diagnosis not present

## 2022-02-03 DIAGNOSIS — Z880 Allergy status to penicillin: Secondary | ICD-10-CM

## 2022-02-03 DIAGNOSIS — Z79899 Other long term (current) drug therapy: Secondary | ICD-10-CM | POA: Diagnosis not present

## 2022-02-03 DIAGNOSIS — I248 Other forms of acute ischemic heart disease: Secondary | ICD-10-CM | POA: Diagnosis present

## 2022-02-03 DIAGNOSIS — I959 Hypotension, unspecified: Secondary | ICD-10-CM | POA: Diagnosis not present

## 2022-02-03 DIAGNOSIS — E872 Acidosis, unspecified: Secondary | ICD-10-CM | POA: Diagnosis present

## 2022-02-03 DIAGNOSIS — A419 Sepsis, unspecified organism: Secondary | ICD-10-CM | POA: Diagnosis present

## 2022-02-03 DIAGNOSIS — Z20822 Contact with and (suspected) exposure to covid-19: Secondary | ICD-10-CM | POA: Diagnosis present

## 2022-02-03 DIAGNOSIS — J9690 Respiratory failure, unspecified, unspecified whether with hypoxia or hypercapnia: Secondary | ICD-10-CM | POA: Diagnosis not present

## 2022-02-03 DIAGNOSIS — J9811 Atelectasis: Secondary | ICD-10-CM | POA: Diagnosis not present

## 2022-02-03 DIAGNOSIS — I5030 Unspecified diastolic (congestive) heart failure: Secondary | ICD-10-CM | POA: Diagnosis not present

## 2022-02-03 DIAGNOSIS — K760 Fatty (change of) liver, not elsewhere classified: Secondary | ICD-10-CM | POA: Diagnosis not present

## 2022-02-03 DIAGNOSIS — J189 Pneumonia, unspecified organism: Secondary | ICD-10-CM | POA: Diagnosis present

## 2022-02-03 DIAGNOSIS — Z981 Arthrodesis status: Secondary | ICD-10-CM

## 2022-02-03 NOTE — ED Triage Notes (Signed)
Pt BIB [facility] from home, back surgery x 2 days ago, d/c yesterday. Developed shortness of breath tonight, given duo-neb, denies chest pain/nausea. HR 80, SpO2 78% room air, improved after duo-neb and placed on 2LNC.

## 2022-02-04 ENCOUNTER — Emergency Department (HOSPITAL_COMMUNITY): Payer: PPO

## 2022-02-04 ENCOUNTER — Inpatient Hospital Stay (HOSPITAL_COMMUNITY): Payer: PPO

## 2022-02-04 ENCOUNTER — Encounter (HOSPITAL_COMMUNITY): Payer: Self-pay | Admitting: Internal Medicine

## 2022-02-04 DIAGNOSIS — Z7984 Long term (current) use of oral hypoglycemic drugs: Secondary | ICD-10-CM | POA: Diagnosis not present

## 2022-02-04 DIAGNOSIS — E785 Hyperlipidemia, unspecified: Secondary | ICD-10-CM | POA: Diagnosis present

## 2022-02-04 DIAGNOSIS — J810 Acute pulmonary edema: Secondary | ICD-10-CM | POA: Diagnosis present

## 2022-02-04 DIAGNOSIS — J189 Pneumonia, unspecified organism: Secondary | ICD-10-CM | POA: Diagnosis present

## 2022-02-04 DIAGNOSIS — Z6841 Body Mass Index (BMI) 40.0 and over, adult: Secondary | ICD-10-CM | POA: Diagnosis not present

## 2022-02-04 DIAGNOSIS — R0902 Hypoxemia: Secondary | ICD-10-CM | POA: Diagnosis not present

## 2022-02-04 DIAGNOSIS — I248 Other forms of acute ischemic heart disease: Secondary | ICD-10-CM | POA: Diagnosis not present

## 2022-02-04 DIAGNOSIS — I509 Heart failure, unspecified: Secondary | ICD-10-CM | POA: Diagnosis not present

## 2022-02-04 DIAGNOSIS — J90 Pleural effusion, not elsewhere classified: Secondary | ICD-10-CM | POA: Diagnosis not present

## 2022-02-04 DIAGNOSIS — Z888 Allergy status to other drugs, medicaments and biological substances status: Secondary | ICD-10-CM | POA: Diagnosis not present

## 2022-02-04 DIAGNOSIS — I5030 Unspecified diastolic (congestive) heart failure: Secondary | ICD-10-CM | POA: Diagnosis not present

## 2022-02-04 DIAGNOSIS — J9601 Acute respiratory failure with hypoxia: Secondary | ICD-10-CM

## 2022-02-04 DIAGNOSIS — A419 Sepsis, unspecified organism: Secondary | ICD-10-CM | POA: Diagnosis present

## 2022-02-04 DIAGNOSIS — R079 Chest pain, unspecified: Secondary | ICD-10-CM | POA: Diagnosis not present

## 2022-02-04 DIAGNOSIS — R6520 Severe sepsis without septic shock: Secondary | ICD-10-CM | POA: Diagnosis present

## 2022-02-04 DIAGNOSIS — R0602 Shortness of breath: Secondary | ICD-10-CM | POA: Diagnosis not present

## 2022-02-04 DIAGNOSIS — Z880 Allergy status to penicillin: Secondary | ICD-10-CM | POA: Diagnosis not present

## 2022-02-04 DIAGNOSIS — I5043 Acute on chronic combined systolic (congestive) and diastolic (congestive) heart failure: Secondary | ICD-10-CM | POA: Diagnosis not present

## 2022-02-04 DIAGNOSIS — R109 Unspecified abdominal pain: Secondary | ICD-10-CM | POA: Diagnosis not present

## 2022-02-04 DIAGNOSIS — I517 Cardiomegaly: Secondary | ICD-10-CM | POA: Diagnosis not present

## 2022-02-04 DIAGNOSIS — K219 Gastro-esophageal reflux disease without esophagitis: Secondary | ICD-10-CM | POA: Diagnosis present

## 2022-02-04 DIAGNOSIS — Z833 Family history of diabetes mellitus: Secondary | ICD-10-CM | POA: Diagnosis not present

## 2022-02-04 DIAGNOSIS — E872 Acidosis, unspecified: Secondary | ICD-10-CM | POA: Diagnosis not present

## 2022-02-04 DIAGNOSIS — I447 Left bundle-branch block, unspecified: Secondary | ICD-10-CM | POA: Diagnosis present

## 2022-02-04 DIAGNOSIS — J9811 Atelectasis: Secondary | ICD-10-CM | POA: Diagnosis not present

## 2022-02-04 DIAGNOSIS — E119 Type 2 diabetes mellitus without complications: Secondary | ICD-10-CM | POA: Diagnosis present

## 2022-02-04 DIAGNOSIS — I340 Nonrheumatic mitral (valve) insufficiency: Secondary | ICD-10-CM | POA: Diagnosis present

## 2022-02-04 DIAGNOSIS — J9690 Respiratory failure, unspecified, unspecified whether with hypoxia or hypercapnia: Secondary | ICD-10-CM | POA: Diagnosis not present

## 2022-02-04 DIAGNOSIS — I110 Hypertensive heart disease with heart failure: Secondary | ICD-10-CM | POA: Diagnosis not present

## 2022-02-04 DIAGNOSIS — E1165 Type 2 diabetes mellitus with hyperglycemia: Secondary | ICD-10-CM | POA: Diagnosis present

## 2022-02-04 DIAGNOSIS — D62 Acute posthemorrhagic anemia: Secondary | ICD-10-CM | POA: Diagnosis not present

## 2022-02-04 DIAGNOSIS — K760 Fatty (change of) liver, not elsewhere classified: Secondary | ICD-10-CM | POA: Diagnosis not present

## 2022-02-04 DIAGNOSIS — M109 Gout, unspecified: Secondary | ICD-10-CM | POA: Diagnosis present

## 2022-02-04 DIAGNOSIS — Z20822 Contact with and (suspected) exposure to covid-19: Secondary | ICD-10-CM | POA: Diagnosis not present

## 2022-02-04 DIAGNOSIS — Z79899 Other long term (current) drug therapy: Secondary | ICD-10-CM | POA: Diagnosis not present

## 2022-02-04 LAB — CBC WITH DIFFERENTIAL/PLATELET
Abs Immature Granulocytes: 0.24 10*3/uL — ABNORMAL HIGH (ref 0.00–0.07)
Basophils Absolute: 0.1 10*3/uL (ref 0.0–0.1)
Basophils Relative: 0 %
Eosinophils Absolute: 0 10*3/uL (ref 0.0–0.5)
Eosinophils Relative: 0 %
HCT: 25.8 % — ABNORMAL LOW (ref 36.0–46.0)
Hemoglobin: 8.1 g/dL — ABNORMAL LOW (ref 12.0–15.0)
Immature Granulocytes: 1 %
Lymphocytes Relative: 9 %
Lymphs Abs: 2.1 10*3/uL (ref 0.7–4.0)
MCH: 28.2 pg (ref 26.0–34.0)
MCHC: 31.4 g/dL (ref 30.0–36.0)
MCV: 89.9 fL (ref 80.0–100.0)
Monocytes Absolute: 0.9 10*3/uL (ref 0.1–1.0)
Monocytes Relative: 4 %
Neutro Abs: 19.5 10*3/uL — ABNORMAL HIGH (ref 1.7–7.7)
Neutrophils Relative %: 86 %
Platelets: 211 10*3/uL (ref 150–400)
RBC: 2.87 MIL/uL — ABNORMAL LOW (ref 3.87–5.11)
RDW: 16.6 % — ABNORMAL HIGH (ref 11.5–15.5)
WBC: 22.9 10*3/uL — ABNORMAL HIGH (ref 4.0–10.5)
nRBC: 0.1 % (ref 0.0–0.2)

## 2022-02-04 LAB — ECHOCARDIOGRAM COMPLETE
AR max vel: 2.41 cm2
AV Area VTI: 2.34 cm2
AV Area mean vel: 2.28 cm2
AV Mean grad: 7 mmHg
AV Peak grad: 13 mmHg
Ao pk vel: 1.8 m/s
Area-P 1/2: 3.6 cm2
Height: 61.5 in
S' Lateral: 4.4 cm
Weight: 3520.31 oz

## 2022-02-04 LAB — COMPREHENSIVE METABOLIC PANEL
ALT: 29 U/L (ref 0–44)
AST: 27 U/L (ref 15–41)
Albumin: 3.2 g/dL — ABNORMAL LOW (ref 3.5–5.0)
Alkaline Phosphatase: 42 U/L (ref 38–126)
Anion gap: 16 — ABNORMAL HIGH (ref 5–15)
BUN: 10 mg/dL (ref 8–23)
CO2: 18 mmol/L — ABNORMAL LOW (ref 22–32)
Calcium: 8.3 mg/dL — ABNORMAL LOW (ref 8.9–10.3)
Chloride: 102 mmol/L (ref 98–111)
Creatinine, Ser: 0.77 mg/dL (ref 0.44–1.00)
GFR, Estimated: >60 mL/min (ref >60)
Glucose, Bld: 273 mg/dL — ABNORMAL HIGH (ref 70–99)
Potassium: 4.7 mmol/L (ref 3.5–5.1)
Sodium: 136 mmol/L (ref 135–145)
Total Bilirubin: 0.9 mg/dL (ref 0.3–1.2)
Total Protein: 6.1 g/dL — ABNORMAL LOW (ref 6.5–8.1)

## 2022-02-04 LAB — MRSA NEXT GEN BY PCR, NASAL: MRSA by PCR Next Gen: NOT DETECTED

## 2022-02-04 LAB — I-STAT ARTERIAL BLOOD GAS, ED
Acid-base deficit: 8 mmol/L — ABNORMAL HIGH (ref 0.0–2.0)
Bicarbonate: 16.9 mmol/L — ABNORMAL LOW (ref 20.0–28.0)
Calcium, Ion: 1.09 mmol/L — ABNORMAL LOW (ref 1.15–1.40)
HCT: 27 % — ABNORMAL LOW (ref 36.0–46.0)
Hemoglobin: 9.2 g/dL — ABNORMAL LOW (ref 12.0–15.0)
O2 Saturation: 92 %
Patient temperature: 100.1
Potassium: 4.6 mmol/L (ref 3.5–5.1)
Sodium: 135 mmol/L (ref 135–145)
TCO2: 18 mmol/L — ABNORMAL LOW (ref 22–32)
pCO2 arterial: 31 mmHg — ABNORMAL LOW (ref 32.0–48.0)
pH, Arterial: 7.349 — ABNORMAL LOW (ref 7.350–7.450)
pO2, Arterial: 70 mmHg — ABNORMAL LOW (ref 83.0–108.0)

## 2022-02-04 LAB — URINALYSIS, ROUTINE W REFLEX MICROSCOPIC
Bacteria, UA: NONE SEEN
Bilirubin Urine: NEGATIVE
Glucose, UA: >=500 mg/dL — AB
Hgb urine dipstick: NEGATIVE
Ketones, ur: 20 mg/dL — AB
Leukocytes,Ua: NEGATIVE
Nitrite: NEGATIVE
Protein, ur: NEGATIVE mg/dL
Specific Gravity, Urine: 1.015 (ref 1.005–1.030)
pH: 5 (ref 5.0–8.0)

## 2022-02-04 LAB — PROCALCITONIN: Procalcitonin: 0.2 ng/mL

## 2022-02-04 LAB — RESP PANEL BY RT-PCR (FLU A&B, COVID) ARPGX2
Influenza A by PCR: NEGATIVE
Influenza B by PCR: NEGATIVE
SARS Coronavirus 2 by RT PCR: NEGATIVE

## 2022-02-04 LAB — GLUCOSE, CAPILLARY
Glucose-Capillary: 273 mg/dL — ABNORMAL HIGH (ref 70–99)
Glucose-Capillary: 219 mg/dL — ABNORMAL HIGH (ref 70–99)
Glucose-Capillary: 207 mg/dL — ABNORMAL HIGH (ref 70–99)
Glucose-Capillary: 171 mg/dL — ABNORMAL HIGH (ref 70–99)
Glucose-Capillary: 179 mg/dL — ABNORMAL HIGH (ref 70–99)

## 2022-02-04 LAB — LACTIC ACID, PLASMA
Lactic Acid, Venous: 4.1 mmol/L (ref 0.5–1.9)
Lactic Acid, Venous: 5.1 mmol/L (ref 0.5–1.9)
Lactic Acid, Venous: 3 mmol/L (ref 0.5–1.9)

## 2022-02-04 LAB — MAGNESIUM: Magnesium: 1.4 mg/dL — ABNORMAL LOW (ref 1.7–2.4)

## 2022-02-04 LAB — HIV ANTIBODY (ROUTINE TESTING W REFLEX): HIV Screen 4th Generation wRfx: NONREACTIVE

## 2022-02-04 LAB — TROPONIN I (HIGH SENSITIVITY)
Troponin I (High Sensitivity): 80 ng/L — ABNORMAL HIGH (ref <18)
Troponin I (High Sensitivity): 114 ng/L (ref <18)
Troponin I (High Sensitivity): 185 ng/L (ref <18)
Troponin I (High Sensitivity): 149 ng/L (ref <18)

## 2022-02-04 LAB — HEMOGLOBIN A1C
Hgb A1c MFr Bld: 6.6 % — ABNORMAL HIGH (ref 4.8–5.6)
Mean Plasma Glucose: 142.72 mg/dL

## 2022-02-04 LAB — CBG MONITORING, ED: Glucose-Capillary: 241 mg/dL — ABNORMAL HIGH (ref 70–99)

## 2022-02-04 LAB — BRAIN NATRIURETIC PEPTIDE: B Natriuretic Peptide: 438 pg/mL — ABNORMAL HIGH (ref 0.0–100.0)

## 2022-02-04 MED ORDER — MORPHINE SULFATE (PF) 4 MG/ML IV SOLN
4.0000 mg | Freq: Once | INTRAVENOUS | Status: AC
  Administered 2022-02-04: 4 mg via INTRAVENOUS
  Filled 2022-02-04: qty 1

## 2022-02-04 MED ORDER — IOHEXOL 350 MG/ML SOLN
100.0000 mL | Freq: Once | INTRAVENOUS | Status: AC | PRN
  Administered 2022-02-04: 100 mL via INTRAVENOUS

## 2022-02-04 MED ORDER — FUROSEMIDE 10 MG/ML IJ SOLN
INTRAMUSCULAR | Status: AC
  Administered 2022-02-04: 40 mg via INTRAVENOUS
  Filled 2022-02-04: qty 4

## 2022-02-04 MED ORDER — AMLODIPINE BESYLATE 10 MG PO TABS
10.0000 mg | ORAL_TABLET | Freq: Every day | ORAL | Status: DC
  Administered 2022-02-04 – 2022-02-07 (×4): 10 mg via ORAL
  Filled 2022-02-04 (×4): qty 1

## 2022-02-04 MED ORDER — METOPROLOL TARTRATE 50 MG PO TABS
75.0000 mg | ORAL_TABLET | Freq: Two times a day (BID) | ORAL | Status: DC
  Administered 2022-02-04 – 2022-02-06 (×5): 75 mg via ORAL
  Filled 2022-02-04 (×5): qty 1

## 2022-02-04 MED ORDER — MAGNESIUM SULFATE 4 GM/100ML IV SOLN
4.0000 g | Freq: Once | INTRAVENOUS | Status: AC
  Administered 2022-02-04: 4 g via INTRAVENOUS
  Filled 2022-02-04: qty 100

## 2022-02-04 MED ORDER — SODIUM CHLORIDE 0.9 % IV BOLUS
500.0000 mL | Freq: Once | INTRAVENOUS | Status: AC
  Administered 2022-02-04: 500 mL via INTRAVENOUS

## 2022-02-04 MED ORDER — ENOXAPARIN SODIUM 40 MG/0.4ML IJ SOSY: 40.0000 mg | PREFILLED_SYRINGE | Freq: Every day | INTRAMUSCULAR | Status: DC

## 2022-02-04 MED ORDER — SODIUM CHLORIDE 0.9 % IV SOLN
2.0000 g | Freq: Three times a day (TID) | INTRAVENOUS | Status: DC
  Administered 2022-02-04 – 2022-02-05 (×3): 2 g via INTRAVENOUS
  Filled 2022-02-04 (×3): qty 2

## 2022-02-04 MED ORDER — POLYETHYLENE GLYCOL 3350 17 G PO PACK: 17.0000 g | PACK | Freq: Every day | ORAL | Status: DC | PRN

## 2022-02-04 MED ORDER — ONDANSETRON HCL 4 MG/2ML IJ SOLN
4.0000 mg | Freq: Once | INTRAMUSCULAR | Status: AC
  Administered 2022-02-04: 4 mg via INTRAVENOUS

## 2022-02-04 MED ORDER — VANCOMYCIN HCL IN DEXTROSE 1-5 GM/200ML-% IV SOLN
1000.0000 mg | Freq: Once | INTRAVENOUS | Status: AC
Start: 2022-02-04 — End: 2022-02-04
  Administered 2022-02-04: 1000 mg via INTRAVENOUS
  Filled 2022-02-04: qty 200

## 2022-02-04 MED ORDER — FUROSEMIDE 10 MG/ML IJ SOLN: 40.0000 mg | Freq: Once | INTRAMUSCULAR | Status: AC

## 2022-02-04 MED ORDER — PRAVASTATIN SODIUM 40 MG PO TABS
40.0000 mg | ORAL_TABLET | Freq: Every day | ORAL | Status: DC
  Administered 2022-02-04 – 2022-02-07 (×4): 40 mg via ORAL
  Filled 2022-02-04 (×4): qty 1

## 2022-02-04 MED ORDER — PANTOPRAZOLE SODIUM 40 MG PO TBEC
40.0000 mg | DELAYED_RELEASE_TABLET | Freq: Every day | ORAL | Status: DC
  Administered 2022-02-04 – 2022-02-07 (×4): 40 mg via ORAL
  Filled 2022-02-04 (×4): qty 1

## 2022-02-04 MED ORDER — VANCOMYCIN HCL 750 MG/150ML IV SOLN
750.0000 mg | Freq: Two times a day (BID) | INTRAVENOUS | Status: DC
  Filled 2022-02-04: qty 150

## 2022-02-04 MED ORDER — SODIUM CHLORIDE 0.9 % IV BOLUS
1000.0000 mL | Freq: Once | INTRAVENOUS | Status: AC
  Administered 2022-02-04: 1000 mL via INTRAVENOUS

## 2022-02-04 MED ORDER — LORAZEPAM 2 MG/ML IJ SOLN
INTRAMUSCULAR | Status: AC
  Filled 2022-02-04: qty 1

## 2022-02-04 MED ORDER — IPRATROPIUM-ALBUTEROL 0.5-2.5 (3) MG/3ML IN SOLN: 3.0000 mL | RESPIRATORY_TRACT | Status: DC | PRN

## 2022-02-04 MED ORDER — VANCOMYCIN HCL 750 MG/150ML IV SOLN
750.0000 mg | Freq: Two times a day (BID) | INTRAVENOUS | Status: DC
  Administered 2022-02-04 – 2022-02-05 (×2): 750 mg via INTRAVENOUS
  Filled 2022-02-04 (×3): qty 150

## 2022-02-04 MED ORDER — INSULIN ASPART 100 UNIT/ML IJ SOLN
0.0000 [IU] | INTRAMUSCULAR | Status: DC
  Administered 2022-02-04 (×2): 3 [IU] via SUBCUTANEOUS
  Administered 2022-02-04: 5 [IU] via SUBCUTANEOUS
  Administered 2022-02-04: 8 [IU] via SUBCUTANEOUS
  Administered 2022-02-04: 5 [IU] via SUBCUTANEOUS
  Administered 2022-02-05: 2 [IU] via SUBCUTANEOUS
  Administered 2022-02-05: 3 [IU] via SUBCUTANEOUS

## 2022-02-04 MED ORDER — CHLORHEXIDINE GLUCONATE CLOTH 2 % EX PADS
6.0000 | MEDICATED_PAD | Freq: Every day | CUTANEOUS | Status: DC
  Administered 2022-02-05 – 2022-02-07 (×2): 6 via TOPICAL

## 2022-02-04 MED ORDER — ORAL CARE MOUTH RINSE
15.0000 mL | Freq: Two times a day (BID) | OROMUCOSAL | Status: DC
  Administered 2022-02-04 – 2022-02-07 (×6): 15 mL via OROMUCOSAL

## 2022-02-04 MED ORDER — SODIUM CHLORIDE 0.9 % IV SOLN
2.0000 g | Freq: Once | INTRAVENOUS | Status: AC
  Administered 2022-02-04: 2 g via INTRAVENOUS
  Filled 2022-02-04: qty 2

## 2022-02-04 MED ORDER — DOCUSATE SODIUM 100 MG PO CAPS: 100.0000 mg | ORAL_CAPSULE | Freq: Two times a day (BID) | ORAL | Status: DC | PRN

## 2022-02-04 MED ORDER — ACETAMINOPHEN 325 MG PO TABS
650.0000 mg | ORAL_TABLET | ORAL | Status: DC | PRN
  Administered 2022-02-04 (×2): 650 mg via ORAL
  Filled 2022-02-04 (×2): qty 2

## 2022-02-04 MED ORDER — HYDROCHLOROTHIAZIDE 12.5 MG PO TABS
12.5000 mg | ORAL_TABLET | Freq: Every day | ORAL | Status: DC
  Administered 2022-02-04 – 2022-02-07 (×4): 12.5 mg via ORAL
  Filled 2022-02-04 (×4): qty 1

## 2022-02-04 MED ORDER — LORAZEPAM 2 MG/ML IJ SOLN
1.0000 mg | Freq: Once | INTRAMUSCULAR | Status: AC
  Administered 2022-02-04: 1 mg via INTRAVENOUS

## 2022-02-04 MED ORDER — IRBESARTAN 300 MG PO TABS
300.0000 mg | ORAL_TABLET | Freq: Every day | ORAL | Status: DC
  Filled 2022-02-04 (×2): qty 1

## 2022-02-04 NOTE — Progress Notes (Signed)
Pt arrived to unit on 4L [place] . Pt is tolerating well at this time. BIPAP on standby.

## 2022-02-04 NOTE — H&P (Signed)
NAME:  Diane Mayo, MRN:  [id number], DOB:  07-Apr-1951, LOS: 0 ADMISSION DATE:  02/03/2022, CONSULTATION DATE:  2/11 REFERRING MD:  Dr. Stark Jock, CHIEF COMPLAINT:  acute respiratory failure/pulmonary edema   History of Present Illness:  Patient is a 71 year old female with pertinent PMH HTN, GERD, HFrEF, LBBB, DMT2, s/p PLIF L3-L5 2/8 by Dr. Saintclair Halsted presents to [facility] ED on 2/11 with SOB.  Patient brought by EMS on 2/11 to [facility].  Patient states SOB started around 8 PM on 2/10.  Described as sudden onset dyspnea.  Increased SOB with ambulation.  Sats found to be 78% by EMS.  Improved with [place] and breathing treatment.  Fever 100.1.  Denies chest pain, cough.  Back dressing in place without pain, erythema, or drainage.  Pertinent ED labs: WBC 22.9, HGB 8.1, glucose 273, troponin 80-114, BNP 438, lactic acid 4.1-5.1, COVID/flu negative, UA negative.  BC x2 and UC pending. CXR initially clear.  Patient given IV fluids for sepsis.  Started on cefepime and Vanc. Patient became more hypoxic and SOB.  Patient placed on BiPAP.  CTA chest negative for PE, cardiomegaly, atelectasis, and interstitial edema present.  Patient given 40 mg IV Lasix.  PCCM consulted for ICU admission.  Pertinent  Medical History   Past Medical History:  Diagnosis Date   Diabetes mellitus without complication (HCC)    Type 2   Genital herpes    GERD (gastroesophageal reflux disease)    Hypertension    Interstitial cystitis    Left bundle branch block (LBBB)    chronic   Leukocytosis    Osteoarthritis    Plantar fasciitis      Significant Hospital Events: Including procedures, antibiotic start and stop dates in addition to other pertinent events   2/11: Admitted to [facility] with respiratory distress; appears to be CHF exacerbation and placed on BiPAP  Interim History / Subjective:  Patient sitting upright in bed. RR in 30s Sats upper 90s on BiPAP Patient AO x3  Objective   Blood pressure (!) 144/79, pulse (!) 134, temperature  100.1 F (37.8 C), temperature source Oral, resp. rate (!) 39, SpO2 100 %.       No intake or output data in the 24 hours ending 02/04/22 0458 There were no vitals filed for this visit.  Examination: General:  ill appearing on BiPAP HEENT: MM pink/moist; BiPAP in place Neuro: Aox3; MAE CV: s1s2, tachycardia 130s, no m/r/g PULM: [place] BS bilaterally; BiPAP with sats upper 90s GI: soft, bsx4 active  Extremities: warm/dry, trace BLE edema; lower back dressing in place without erythema, pain, drainage Skin: no rashes or lesions appreciated   Resolved Hospital Problem list     Assessment & Plan:  Acute respiratory Distress w/ hypoxia CHF exacerbation: BNP 438 Hx of HFrEF: last echo 06/2019 EF 50-55% P: -We will admit to ICU for closer monitoring while on BiPAP -Given IV Lasix -Continue BiPAP -Check ABG -May consider weaning off BiPAP later today to [place] -Repeat CXR -As needed DuoNeb for wheezing -will need pulmonary toiletry when off BiPAP -Check echo -Trend troponin  Sepsis P: -Hold further fluid resuscitation -Trend lactate and troponin -Continue broad-spectrum antibiotics -Follow BC x2, UC, expectorated sputum -Check procalcitonin -Trend WBC/fever curve -As needed Tylenol for fever  S/p PLIF L3-L5 2/8 P: -SCD for DVT prophylaxis -will need PT/OT when off BiPAP -As needed Tylenol for pain  Hx of HTN Hx of HLD Hx of LBBB P: -Continue home BP meds -Continue statin  GERD P: -  PPI  DMT2 P: -Check A1c -SSI and CBG monitoring  Hx of gout P: -Consider restarting home allopurinol  Best Practice (right click and "Reselect all SmartList Selections" daily)   Diet/type: NPO w/ oral meds DVT prophylaxis: SCD GI prophylaxis: PPI Lines: N/A Foley:  N/A Code Status:  full code Last date of multidisciplinary goals of care discussion [2/11 spoke with patient and family at bedside.  Patient wants to be full code.]  Labs   CBC: Recent Labs  Lab  01/30/22 1349 02/04/22 0130  WBC 12.9* 22.9*  NEUTROABS  --  19.5*  HGB 12.8 8.1*  HCT 40.8 25.8*  MCV 88.1 89.9  PLT 291 952    Basic Metabolic Panel: Recent Labs  Lab 01/30/22 1349 02/04/22 0130  NA 137 136  K 4.1 4.7  CL 101 102  CO2 22 18*  GLUCOSE 135* 273*  BUN 12 10  CREATININE 0.72 0.77  CALCIUM 9.5 8.3*   GFR: Estimated Creatinine Clearance: 72.6 mL/min (by C-G formula based on SCr of 0.77 mg/dL). Recent Labs  Lab 01/30/22 1349 02/04/22 0130 02/04/22 0253  WBC 12.9* 22.9*  --   LATICACIDVEN  --  4.1* 5.1*    Liver Function Tests: Recent Labs  Lab 02/04/22 0130  AST 27  ALT 29  ALKPHOS 42  BILITOT 0.9  PROT 6.1*  ALBUMIN 3.2*   No results for input(s): LIPASE, AMYLASE in the last 168 hours. No results for input(s): AMMONIA in the last 168 hours.  ABG No results found for: PHART, PCO2ART, PO2ART, HCO3, TCO2, ACIDBASEDEF, O2SAT   Coagulation Profile: No results for input(s): INR, PROTIME in the last 168 hours.  Cardiac Enzymes: No results for input(s): CKTOTAL, CKMB, CKMBINDEX, TROPONINI in the last 168 hours.  HbA1C: Hgb A1c MFr Bld  Date/Time Value Ref Range Status  01/30/2022 01:49 PM 7.0 (H) 4.8 - 5.6 % Final    Comment:    (NOTE) Pre diabetes:          5.7%-6.4%  Diabetes:              >6.4%  Glycemic control for   <7.0% adults with diabetes     CBG: Recent Labs  Lab 02/01/22 1200 02/01/22 1249 02/01/22 1555 02/01/22 2105 02/02/22 0613  GLUCAP 192* 191* 250* 213* 170*    Review of Systems:   Patient is encephalopathic and/or intubated. Therefore history has been obtained from chart review.    Past Medical History:  She,  has a past medical history of Diabetes mellitus without complication ([place]), Genital herpes, GERD (gastroesophageal reflux disease), Hypertension, Interstitial cystitis, Left bundle branch block (LBBB), Leukocytosis, Osteoarthritis, and Plantar fasciitis.   Surgical History:   Past Surgical  History:  Procedure Laterality Date   CARPAL TUNNEL RELEASE Bilateral    CESAREAN SECTION     twice 1978 and [place]   EYE SURGERY Bilateral 2021   cataracts removed   SHOULDER SURGERY Right 2012   rotator cuff repair   TUBAL LIGATION  1987     Social History:   reports that she has never smoked. She has never used smokeless tobacco. She reports current alcohol use. She reports that she does not use drugs.   Family History:  Her family history includes Alcoholism in her brother; Asthma in her brother; CAD in her father; CVA in her father; Diabetes in her mother; Diabetes type II in her maternal grandfather; Gout in her father; Heart attack in  her father; Hypertension in her father and mother; Kidney failure in her father; Other in her brother and mother; Pneumonia in her mother; Skin cancer in her father.   Allergies Allergies  Allergen Reactions   Quinapril Hcl     Other reaction(s): cough   Sitagliptin     Other reaction(s): thrush   Tioconazole     Other reaction(s): rash   Lisinopril Other (See Comments)    Other reaction(s): cough   Miconazole Itching and Swelling    Monistat    Penicillins Itching and Rash    Other reaction(s): trouble breathing, rash     Home Medications  Prior to Admission medications   Medication Sig Start Date End Date Taking? Authorizing Provider  acetaminophen (TYLENOL) 500 MG tablet Take 1,000 mg by mouth every 6 (six) hours as needed.    [provider]  allopurinol (ZYLOPRIM) 300 MG tablet Take 300 mg by mouth daily.    [provider]  amLODipine (NORVASC) 10 MG tablet Take 10 mg by mouth daily.    [provider]  celecoxib (CELEBREX) 200 MG capsule Take 200 mg by mouth daily. 11/15/20   [provider]  cetirizine (ZYRTEC) 10 MG tablet Take 10 mg by mouth daily.    [provider]  colchicine 0.6 MG tablet TAKE 1 TABLET BY MOUTH TWICE A DAY Patient taking  differently: Take 0.6 mg by mouth 2 (two) times daily as needed (gout). 08/25/19   [name], DPM  cyclobenzaprine (FLEXERIL) 10 MG tablet Take 1 tablet (10 mg total) by mouth 3 (three) times daily as needed for muscle spasms. 02/02/22   Meyran, Ocie Cornfield, NP  hydrochlorothiazide (MICROZIDE) 12.5 MG capsule Take 12.5 mg by mouth daily. 12/06/21   [provider]  HYDROcodone-acetaminophen (NORCO/VICODIN) 5-325 MG tablet Take 1-2 tablets by mouth every 4 (four) hours as needed for severe pain ((score 7 to 10)). 02/02/22   Meyran, Ocie Cornfield, NP  irbesartan (AVAPRO) 300 MG tablet TAKE 1 TABLET BY MOUTH DAILY 01/18/21   [name], MD  metFORMIN (GLUCOPHAGE) 1000 MG tablet Take 1,000 mg by mouth 2 (two) times daily with a meal.    [provider]  metoprolol tartrate (LOPRESSOR) 25 MG tablet Take 75 mg by mouth 2 (two) times daily. 10/18/19   [provider]  Multiple Vitamins-Minerals (EMERGEN-C IMMUNE PLUS) PACK Take 1 packet by mouth daily.    [provider]  nystatin cream (MYCOSTATIN) Apply 1 application topically daily as needed for rash.    [provider]  omeprazole (PRILOSEC) 40 MG capsule Take 40 mg by mouth daily.    [provider]  Pentosan Polysulfate Sodium 200 MG CPDR Take 200 mg by mouth 2 (two) times daily.    [provider]  polyvinyl alcohol (LIQUIFILM TEARS) 1.4 % ophthalmic solution Place 1 drop into both eyes as needed for dry eyes.    [provider]  pravastatin (PRAVACHOL) 40 MG tablet Take 40 mg by mouth daily.    [provider]  trimethoprim (TRIMPEX) 100 MG tablet Take 100 mg by mouth daily. 12/13/20   [provider]  vitamin B-12 (CYANOCOBALAMIN) 1000 MCG tablet Take 1,000 mcg by mouth daily.    [provider]     Critical care time: 45 minutes    JD Rexene Agent [facility] 02/04/2022, 4:58 AM  Please see [email or web address] for  pager details.  From 7A-7P if no response, please call [phone]. After  hours, please call ELink [phone].

## 2022-02-04 NOTE — Progress Notes (Signed)
Placed pt on Bipap. PT tolerating well at this time. Will continue to monitor.

## 2022-02-04 NOTE — Progress Notes (Signed)
Echocardiogram 2D Echocardiogram has been performed.  Arlyss Gandy 02/04/2022, 3:39 PM

## 2022-02-04 NOTE — ED Notes (Signed)
Pt returned from ct with respiratory distress, noted HR 150-170's with O2 saturation in the high 70's. Dr Stark Jock notified. Verbal order Iv lasix 40mg  at bedside dr Stark Jock

## 2022-02-04 NOTE — Progress Notes (Signed)
Pt removed from bipap for sputum collection. PT unable to produce sputum at this time. Cup at bedside. Will attempt again later. PT placed back on bipap. Will continue to monitor.

## 2022-02-04 NOTE — ED Notes (Signed)
Dr Stark Jock aware of lab reported troponin

## 2022-02-04 NOTE — Plan of Care (Signed)
°  Problem: Education: Goal: Knowledge of General Education information will improve Description: Including pain rating scale, medication(s)/side effects and non-pharmacologic comfort measures Outcome: Progressing   Problem: Clinical Measurements: Goal: Respiratory complications will improve Outcome: Progressing   Problem: Activity: Goal: Risk for activity intolerance will decrease Outcome: Progressing   Problem: Nutrition: Goal: Adequate nutrition will be maintained Outcome: Progressing   Problem: Coping: Goal: Level of anxiety will decrease Outcome: Progressing   Problem: Elimination: Goal: Will not experience complications related to urinary retention Outcome: Progressing   Problem: Skin Integrity: Goal: Risk for impaired skin integrity will decrease Outcome: Progressing

## 2022-02-04 NOTE — ED Notes (Signed)
Pt paced on bipap, vo dr Stark Jock. RT at bedside

## 2022-02-04 NOTE — Progress Notes (Signed)
Pharmacy Antibiotic Note  Diane Mayo is a 71 y.o. female admitted on 02/03/2022 with SOB, possible sepsis.  Pharmacy has been consulted for Vancomycin and Cefepime  dosing.  Vancomycin 1 g IV given in ED at [place]: Vancomycin 750 mg IV q12h Cefepime 2 g IV q8h     Temp (24hrs), Avg:100.1 F (37.8 C), Min:100.1 F (37.8 C), Max:100.1 F (37.8 C)  Recent Labs  Lab 01/30/22 1349 02/04/22 0130 02/04/22 0253  WBC 12.9* 22.9*  --   CREATININE 0.72 0.77  --   LATICACIDVEN  --  4.1* 5.1*    Estimated Creatinine Clearance: 72.6 mL/min (by C-G formula based on SCr of 0.77 mg/dL).    Allergies  Allergen Reactions   Quinapril Hcl     Other reaction(s): cough   Sitagliptin     Other reaction(s): thrush   Tioconazole     Other reaction(s): rash   Lisinopril Other (See Comments)    Other reaction(s): cough   Miconazole Itching and Swelling    Monistat    Penicillins Itching and Rash    Other reaction(s): trouble breathing, rash    Diane Mayo 02/04/2022 6:07 AM

## 2022-02-04 NOTE — ED Notes (Signed)
Patient transported to CT 

## 2022-02-04 NOTE — ED Provider Notes (Signed)
[facility] EMERGENCY DEPARTMENT Provider Note   CSN: [id number] Arrival date & time: 02/03/22  2200     History  Chief Complaint  Patient presents with   Shortness of Breath    Diane Mayo is a 71 y.o. female.  Patient is a 71 year old female with past medical history of hypertension, GERD, left bundle branch block, and diabetes.  Brought by EMS.  Patient presenting with shortness of breath that started earlier this evening.  She describes relatively sudden onset of dyspnea.  She reports feeling very short of breath when she attempted to ambulate.  EMS was called and patient was found to be hypoxic with oxygen saturations of 78%.  She was placed on nasal cannula and given a breathing treatment with some improvement.  Patient denies to me she is having any chest pain or cough.  She is found to have fever of 100.1 upon presentation and oxygen saturations of 87%.  Patient did undergo back surgery several days ago with Dr. Saintclair Halsted.  She was doing well until this evening.  The history is provided by the patient.      Home Medications Prior to Admission medications   Medication Sig Start Date End Date Taking? Authorizing Provider  acetaminophen (TYLENOL) 500 MG tablet Take 1,000 mg by mouth every 6 (six) hours as needed.    [provider]  allopurinol (ZYLOPRIM) 300 MG tablet Take 300 mg by mouth daily.    [provider]  amLODipine (NORVASC) 10 MG tablet Take 10 mg by mouth daily.    [provider]  celecoxib (CELEBREX) 200 MG capsule Take 200 mg by mouth daily. 11/15/20   [provider]  cetirizine (ZYRTEC) 10 MG tablet Take 10 mg by mouth daily.    [provider]  colchicine 0.6 MG tablet TAKE 1 TABLET BY MOUTH TWICE A DAY Patient taking differently: Take 0.6 mg by mouth 2 (two) times daily as needed (gout). 08/25/19   [name], DPM  cyclobenzaprine (FLEXERIL) 10 MG tablet Take 1 tablet (10 mg total) by mouth 3  (three) times daily as needed for muscle spasms. 02/02/22   Meyran, Ocie Cornfield, NP  hydrochlorothiazide (MICROZIDE) 12.5 MG capsule Take 12.5 mg by mouth daily. 12/06/21   [provider]  HYDROcodone-acetaminophen (NORCO/VICODIN) 5-325 MG tablet Take 1-2 tablets by mouth every 4 (four) hours as needed for severe pain ((score 7 to 10)). 02/02/22   Meyran, Ocie Cornfield, NP  irbesartan (AVAPRO) 300 MG tablet TAKE 1 TABLET BY MOUTH DAILY 01/18/21   [name], MD  metFORMIN (GLUCOPHAGE) 1000 MG tablet Take 1,000 mg by mouth 2 (two) times daily with a meal.    [provider]  metoprolol tartrate (LOPRESSOR) 25 MG tablet Take 75 mg by mouth 2 (two) times daily. 10/18/19   [provider]  Multiple Vitamins-Minerals (EMERGEN-C IMMUNE PLUS) PACK Take 1 packet by mouth daily.    [provider]  nystatin cream (MYCOSTATIN) Apply 1 application topically daily as needed for rash.    [provider]  omeprazole (PRILOSEC) 40 MG capsule Take 40 mg by mouth daily.    [provider]  Pentosan Polysulfate Sodium 200 MG CPDR Take 200 mg by mouth 2 (two) times daily.    [provider]  polyvinyl alcohol (LIQUIFILM TEARS) 1.4 % ophthalmic solution Place 1 drop into both eyes as needed for dry eyes.    [provider]  pravastatin (PRAVACHOL) 40 MG tablet Take 40  mg by mouth daily.    [provider]  trimethoprim (TRIMPEX) 100 MG tablet Take 100 mg by mouth daily. 12/13/20   [provider]  vitamin B-12 (CYANOCOBALAMIN) 1000 MCG tablet Take 1,000 mcg by mouth daily.    [provider]      Allergies    Quinapril hcl, Sitagliptin, Tioconazole, Lisinopril, Miconazole, and Penicillins    Review of Systems   Review of Systems  All other systems reviewed and are negative.  Physical Exam Updated Vital Signs BP (!) 125/48    Pulse 80    Temp 100.1 F (37.8 C) (Oral)    Resp 16    SpO2 99%  Physical  Exam Vitals and nursing note reviewed.  Constitutional:      General: She is not in acute distress.    Appearance: She is well-developed. She is not diaphoretic.  HENT:     Head: Normocephalic and atraumatic.  Cardiovascular:     Rate and Rhythm: Normal rate and regular rhythm.     Heart sounds: No murmur heard.   No friction rub. No gallop.  Pulmonary:     Effort: Pulmonary effort is normal. No respiratory distress.     Breath sounds: Normal breath sounds. No wheezing.  Abdominal:     General: Bowel sounds are normal. There is no distension.     Palpations: Abdomen is soft.     Tenderness: There is no abdominal tenderness.  Musculoskeletal:        General: Normal range of motion.     Cervical back: Normal range of motion and neck supple.     Right lower leg: No tenderness. No edema.     Left lower leg: No tenderness. No edema.  Skin:    General: Skin is warm and dry.  Neurological:     General: No focal deficit present.     Mental Status: She is alert and oriented to person, place, and time.    ED Results / Procedures / Treatments   Labs (all labs ordered are listed, but only abnormal results are displayed) Labs Reviewed  COMPREHENSIVE METABOLIC PANEL - Abnormal; Notable for the following components:      Result Value   CO2 18 (*)    Glucose, Bld 273 (*)    Calcium 8.3 (*)    Total Protein 6.1 (*)    Albumin 3.2 (*)    Anion gap 16 (*)    All other components within normal limits  CBC WITH DIFFERENTIAL/PLATELET - Abnormal; Notable for the following components:   WBC 22.9 (*)    RBC 2.87 (*)    Hemoglobin 8.1 (*)    HCT 25.8 (*)    RDW 16.6 (*)    Neutro Abs 19.5 (*)    Abs Immature Granulocytes 0.24 (*)    All other components within normal limits  BRAIN NATRIURETIC PEPTIDE - Abnormal; Notable for the following components:   B Natriuretic Peptide 438.0 (*)    All other components within normal limits  LACTIC ACID, PLASMA - Abnormal; Notable for the following  components:   Lactic Acid, Venous 4.1 (*)    All other components within normal limits  TROPONIN I (HIGH SENSITIVITY) - Abnormal; Notable for the following components:   Troponin I (High Sensitivity) 80 (*)    All other components within normal limits  RESP PANEL BY RT-PCR (FLU A&B, COVID) ARPGX2  CULTURE, BLOOD (ROUTINE X 2)  CULTURE, BLOOD (ROUTINE X 2)  LACTIC ACID, PLASMA  TROPONIN  I (HIGH SENSITIVITY)    EKG EKG Interpretation  Date/Time:  Saturday February 04 2022 01:08:54 EST Ventricular Rate:  82 PR Interval:  260 QRS Duration: 171 QT Interval:  445 QTC Calculation: 520 R Axis:   -58 Text Interpretation: Sinus rhythm Prolonged PR interval Left bundle branch block Confirmed by Veryl Speak [phone]) on 02/04/2022 2:35:05 AM  Radiology DG Chest 1 View  Result Date: 02/04/2022 CLINICAL DATA:  Shortness of breath EXAM: CHEST  1 VIEW COMPARISON:  01/11/2010 FINDINGS: Lungs are clear.  No pleural effusion or pneumothorax. The heart is normal in size.  Thoracic aortic atherosclerosis. IMPRESSION: No evidence of acute cardiopulmonary disease. Electronically Signed   By: Julian Hy M.D.   On: 02/04/2022 01:02    Procedures Procedures  Continuous cardiac monitoring BiPAP  Medications Ordered in ED Medications  sodium chloride 0.9 % bolus 500 mL (has no administration in time range)  vancomycin (VANCOCIN) IVPB 1000 mg/200 mL premix (has no administration in time range)  ceFEPIme (MAXIPIME) 2 g in sodium chloride 0.9 % 100 mL IVPB (has no administration in time range)  sodium chloride 0.9 % bolus 1,000 mL (has no administration in time range)  morphine (PF) 4 MG/ML injection 4 mg (4 mg Intravenous Given 02/04/22 0131)    ED Course/ Medical Decision Making/ A&P  This patient presents to the ED for concern of notes of breath, this involves an extensive number of treatment options, and is a complaint that carries with it a high risk of complications and morbidity.  The  differential diagnosis includes pulmonary embolism, pneumonia, pneumothorax, acute coronary syndrome, congestive heart failure, COPD   Co morbidities that complicate the patient evaluation  Recent back surgery   Additional history obtained:  No additional history or external records obtained   Lab Tests:  I Ordered, and personally interpreted labs.  The pertinent results include: CBC showing white count of 23,000.  Basic metabolic panel which is basically unremarkable.  Initial troponin was 80 and BNP mildly elevated.  Chest x-ray showed cardiomegaly, but no acute process.   Imaging Studies ordered:  I ordered imaging studies including chest x-ray and CT scan of the chest, abdomen, and pelvis I independently visualized and interpreted imaging which showed cardiomegaly with pulmonary edema, but no evidence for pulmonary embolism I agree with the radiologist interpretation   Cardiac Monitoring:  The patient was maintained on a cardiac monitor.  I personally viewed and interpreted the cardiac monitored which showed an underlying rhythm of: Sinus rhythm with a left bundle branch block   Medicines ordered and prescription drug management:  I ordered medication including vancomycin and cefepime for fever, elevated lactate, and leukocytosis/possible sepsis.  Patient was also given Lasix for volume overload after receiving IV fluids Reevaluation of the patient after these medicines showed that the patient worsened I have reviewed the patients home medicines and have made adjustments as needed   Test Considered:  No other test considered   Critical Interventions:  Application of BiPAP and IV Lasix   Consultations Obtained:  I requested consultation with the intensivist,  and discussed lab and imaging findings as well as pertinent plan - they recommend: Admission to the ICU   Problem List / ED Course:  Patient is a 70 year old female with past medical history as per HPI.   She recently underwent back surgery and was discharged 2 days ago.  This evening she became short of breath and was found to be hypoxic with EMS.  She arrived here febrile  at 100.1 with white count of 23,000 and lactate of 4.1.  My initial concern was for sepsis.  Patient had blood cultures obtained.  She was given IV fluids and broad-spectrum antibiotics. Also considered was the possibility of pulmonary embolism given the recent surgery.  Patient was sent for a CT scan of the chest, abdomen, and pelvis.  This showed no evidence for pulmonary embolism, but did show cardiomegaly and increased pulmonary vasculature consistent with CHF.  Patient was then given Lasix Her respiratory status continued to decline to the point she became very anxious and air hungry.  She is attempting to climb out of bed and remove her leads.  She was requiring oxygen via nonrebreather after having the CAT scan performed and eventually required BiPAP.  After receiving Ativan, she was able to tolerate this better. At this point, critical care has been consulted and will assume care of the patient.  It is still unclear as to whether her dyspnea is related to an infectious cause or pulmonary edema, however it appears to be elements of both.   Reevaluation:  After the interventions noted above, I reevaluated the patient and found that they have :worsened   Social Determinants of Health:  None   Dispostion:  After consideration of the diagnostic results and the patients response to treatment, I feel that the patent would benefit from admission to the ICU for further management.  CRITICAL CARE Performed by: Veryl Speak Total critical care time: 70 minutes Critical care time was exclusive of separately billable procedures and treating other patients. Critical care was necessary to treat or prevent imminent or life-threatening deterioration. Critical care was time spent personally by me on the following activities:  development of treatment plan with patient and/or surrogate as well as nursing, discussions with consultants, evaluation of patient's response to treatment, examination of patient, obtaining history from patient or surrogate, ordering and performing treatments and interventions, ordering and review of laboratory studies, ordering and review of radiographic studies, pulse oximetry and re-evaluation of patient's condition.     Final Clinical Impression(s) / ED Diagnoses Final diagnoses:  SOB (shortness of breath)    Rx / DC Orders ED Discharge Orders     None         Veryl Speak, MD 02/04/22 [phone]

## 2022-02-05 DIAGNOSIS — J9601 Acute respiratory failure with hypoxia: Secondary | ICD-10-CM | POA: Diagnosis not present

## 2022-02-05 LAB — GLUCOSE, CAPILLARY
Glucose-Capillary: 190 mg/dL — ABNORMAL HIGH (ref 70–99)
Glucose-Capillary: 144 mg/dL — ABNORMAL HIGH (ref 70–99)
Glucose-Capillary: 151 mg/dL — ABNORMAL HIGH (ref 70–99)
Glucose-Capillary: 145 mg/dL — ABNORMAL HIGH (ref 70–99)
Glucose-Capillary: 146 mg/dL — ABNORMAL HIGH (ref 70–99)

## 2022-02-05 LAB — COMPREHENSIVE METABOLIC PANEL
ALT: 29 U/L (ref 0–44)
AST: 37 U/L (ref 15–41)
Albumin: 2.7 g/dL — ABNORMAL LOW (ref 3.5–5.0)
Alkaline Phosphatase: 50 U/L (ref 38–126)
Anion gap: 9 (ref 5–15)
BUN: 19 mg/dL (ref 8–23)
CO2: 22 mmol/L (ref 22–32)
Calcium: 8.1 mg/dL — ABNORMAL LOW (ref 8.9–10.3)
Chloride: 103 mmol/L (ref 98–111)
Creatinine, Ser: 0.79 mg/dL (ref 0.44–1.00)
GFR, Estimated: >60 mL/min (ref >60)
Glucose, Bld: 200 mg/dL — ABNORMAL HIGH (ref 70–99)
Potassium: 4.3 mmol/L (ref 3.5–5.1)
Sodium: 134 mmol/L — ABNORMAL LOW (ref 135–145)
Total Bilirubin: 0.5 mg/dL (ref 0.3–1.2)
Total Protein: 5.5 g/dL — ABNORMAL LOW (ref 6.5–8.1)

## 2022-02-05 LAB — CBC
HCT: 22.5 % — ABNORMAL LOW (ref 36.0–46.0)
Hemoglobin: 7.3 g/dL — ABNORMAL LOW (ref 12.0–15.0)
MCH: 28.9 pg (ref 26.0–34.0)
MCHC: 32.4 g/dL (ref 30.0–36.0)
MCV: 88.9 fL (ref 80.0–100.0)
Platelets: 251 10*3/uL (ref 150–400)
RBC: 2.53 MIL/uL — ABNORMAL LOW (ref 3.87–5.11)
RDW: 16.8 % — ABNORMAL HIGH (ref 11.5–15.5)
WBC: 22.9 10*3/uL — ABNORMAL HIGH (ref 4.0–10.5)
nRBC: 0.2 % (ref 0.0–0.2)

## 2022-02-05 LAB — LACTIC ACID, PLASMA: Lactic Acid, Venous: 2.2 mmol/L (ref 0.5–1.9)

## 2022-02-05 LAB — TROPONIN I (HIGH SENSITIVITY): Troponin I (High Sensitivity): 111 ng/L (ref <18)

## 2022-02-05 LAB — PROCALCITONIN: Procalcitonin: 0.16 ng/mL

## 2022-02-05 MED ORDER — CEFDINIR 300 MG PO CAPS
300.0000 mg | ORAL_CAPSULE | Freq: Two times a day (BID) | ORAL | Status: DC
  Administered 2022-02-05 – 2022-02-07 (×5): 300 mg via ORAL
  Filled 2022-02-05 (×7): qty 1

## 2022-02-05 MED ORDER — HYDROCODONE-ACETAMINOPHEN 5-325 MG PO TABS
1.0000 | ORAL_TABLET | ORAL | Status: DC | PRN
  Administered 2022-02-05 (×4): 1 via ORAL
  Administered 2022-02-06: 2 via ORAL
  Administered 2022-02-06: 1 via ORAL
  Administered 2022-02-06 – 2022-02-07 (×4): 2 via ORAL
  Filled 2022-02-05 (×3): qty 1
  Filled 2022-02-05 (×2): qty 2
  Filled 2022-02-05: qty 1
  Filled 2022-02-05: qty 2
  Filled 2022-02-05: qty 1
  Filled 2022-02-05 (×2): qty 2

## 2022-02-05 MED ORDER — INSULIN ASPART 100 UNIT/ML IJ SOLN: 0.0000 [IU] | Freq: Every day | INTRAMUSCULAR | Status: DC

## 2022-02-05 MED ORDER — HYDROMORPHONE HCL 1 MG/ML IJ SOLN
0.5000 mg | Freq: Once | INTRAMUSCULAR | Status: AC
  Administered 2022-02-05: 0.5 mg via INTRAVENOUS
  Filled 2022-02-05: qty 0.5

## 2022-02-05 MED ORDER — CYCLOBENZAPRINE HCL 10 MG PO TABS
10.0000 mg | ORAL_TABLET | Freq: Three times a day (TID) | ORAL | Status: DC | PRN
  Administered 2022-02-05 – 2022-02-07 (×2): 10 mg via ORAL
  Filled 2022-02-05 (×2): qty 1

## 2022-02-05 MED ORDER — IRBESARTAN 300 MG PO TABS
300.0000 mg | ORAL_TABLET | Freq: Every day | ORAL | Status: DC
  Administered 2022-02-05 – 2022-02-07 (×3): 300 mg via ORAL
  Filled 2022-02-05 (×3): qty 1

## 2022-02-05 MED ORDER — ALLOPURINOL 300 MG PO TABS
300.0000 mg | ORAL_TABLET | Freq: Every day | ORAL | Status: DC
  Administered 2022-02-05 – 2022-02-07 (×3): 300 mg via ORAL
  Filled 2022-02-05 (×3): qty 1

## 2022-02-05 MED ORDER — ENOXAPARIN SODIUM 60 MG/0.6ML IJ SOSY
50.0000 mg | PREFILLED_SYRINGE | INTRAMUSCULAR | Status: DC
  Administered 2022-02-05: 50 mg via SUBCUTANEOUS
  Filled 2022-02-05: qty 0.6

## 2022-02-05 MED ORDER — COLCHICINE 0.6 MG PO TABS
0.6000 mg | ORAL_TABLET | Freq: Two times a day (BID) | ORAL | Status: DC | PRN
  Filled 2022-02-05: qty 1

## 2022-02-05 MED ORDER — CELECOXIB 200 MG PO CAPS
200.0000 mg | ORAL_CAPSULE | Freq: Every day | ORAL | Status: DC
  Administered 2022-02-05 – 2022-02-07 (×3): 200 mg via ORAL
  Filled 2022-02-05 (×3): qty 1

## 2022-02-05 MED ORDER — INSULIN ASPART 100 UNIT/ML IJ SOLN
0.0000 [IU] | Freq: Three times a day (TID) | INTRAMUSCULAR | Status: DC
  Administered 2022-02-05: 3 [IU] via SUBCUTANEOUS
  Administered 2022-02-05 – 2022-02-06 (×2): 2 [IU] via SUBCUTANEOUS
  Administered 2022-02-06 – 2022-02-07 (×2): 3 [IU] via SUBCUTANEOUS

## 2022-02-05 MED ORDER — ACETAMINOPHEN 325 MG PO TABS: 650.0000 mg | ORAL_TABLET | ORAL | Status: DC | PRN

## 2022-02-05 NOTE — Progress Notes (Signed)
RT Note:  Patient sleeping, currently on 2l [place]. No distress noted at this time. RT will monitor as needed.

## 2022-02-05 NOTE — Progress Notes (Signed)
NAME:  RASHAWN ROLON, MRN:  [id number], DOB:  1951/01/18, LOS: 1 ADMISSION DATE:  02/03/2022, CONSULTATION DATE:  2/11 REFERRING MD:  Dr. Stark Jock, CHIEF COMPLAINT:  acute respiratory failure/pulmonary edema   History of Present Illness:  Patient is a 71 year old female with pertinent PMH HTN, GERD, HFrEF, LBBB, DMT2, s/p PLIF L3-L5 2/8 by Dr. Saintclair Halsted presents to [facility] ED on 2/11 with SOB.  Patient brought by EMS on 2/11 to [facility].  Patient states SOB started around 8 PM on 2/10.  Described as sudden onset dyspnea.  Increased SOB with ambulation.  Sats found to be 78% by EMS.  Improved with [place] and breathing treatment.  Fever 100.1.  Denies chest pain, cough.  Back dressing in place without pain, erythema, or drainage.  Pertinent ED labs: WBC 22.9, HGB 8.1, glucose 273, troponin 80-114, BNP 438, lactic acid 4.1-5.1, COVID/flu negative, UA negative.  BC x2 and UC pending. CXR initially clear.  Patient given IV fluids for sepsis.  Started on cefepime and Vanc. Patient became more hypoxic and SOB.  Patient placed on BiPAP.  CTA chest negative for PE, cardiomegaly, atelectasis, and interstitial edema present.  Patient given 40 mg IV Lasix.  PCCM consulted for ICU admission.  Pertinent  Medical History   Past Medical History:  Diagnosis Date   Diabetes mellitus without complication (HCC)    Type 2   Genital herpes    GERD (gastroesophageal reflux disease)    Hypertension    Interstitial cystitis    Left bundle branch block (LBBB)    chronic   Leukocytosis    Osteoarthritis    Plantar fasciitis     Significant Hospital Events: Including procedures, antibiotic start and stop dates in addition to other pertinent events   2/11: Admitted to [facility] with respiratory distress; appears to be CHF exacerbation and placed on BiPAP Echocardiogram 2/11 > LVEF 40-45%, LV mildly dilated with decreased function, grade 2 diastolic dysfunction, RV size and function normal.  Compared to 2020 EF is slightly less.  Lateral  septal LV wall dyssynchrony from underlying LBBB Urine culture 2/11 >> 40 K CFU Morganella  Interim History / Subjective:  Remains on 4 L/min.  Has not required any more BiPAP since transition to ICU No pressors Tolerated diet WBC 22 I/O+ 50 cc total Lactate has cleared, 2.2  Objective   Blood pressure (!) 130/50, pulse 80, temperature 98.9 F (37.2 C), temperature source Oral, resp. rate (!) 24, height 5' 1.5" (1.562 m), weight 99.8 kg, SpO2 94 %.        Intake/Output Summary (Last 24 hours) at 02/05/2022 1059 Last data filed at 02/05/2022 0700 Gross per 24 hour  Intake 1049.8 ml  Output 1000 ml  Net 49.8 ml   Filed Weights   02/04/22 0900  Weight: 99.8 kg    Examination: General: Obese woman, laying in bed in no distress HEENT: Oropharynx clear, pupils equal, strong voice, no stridor Neuro: Awake, alert, appropriate, interacting and following commands CV: Distant, regular, no murmur PULM: Good breath sounds bilaterally, decreased to both bases, no wheezes or crackles GI: Obese, nondistended, positive bowel sounds Extremities: No significant edema Skin: Lumbar surgical wound examined.  Steri-Strips in place without any evidence of purulence or bleeding.  No surrounding erythema   Resolved Hospital Problem list     Assessment & Plan:  Acute respiratory Distress w/ hypoxia Suspected systolic and diastolic CHF exacerbation: BNP 438 Hx of HFrEF: last echo 06/2019 EF 50-55% Possible HCAP, bibasilar atx vs infiltrates  P: -Weaned off BiPAP 2/11, remains on 4 L/min, working to wean O2 further -Hemodynamically improved, repeat Lasix 2/12 and follow volume status, chest x-ray -covering with cefepime, for possible PNA. Will change to PO cefdinir 2/12 -trop peak 149, repeat BNP 2/13  Sepsis at presentation. Source unclear, consider R > LLL PNA.  Consider Morganella UTI although only 40 K CFU.  Her lumbar spine wound looks good P: -Defer any further volume resuscitation,  needs diuresis now that she is hemodynamically stable -Transition antibiotic as above -Following procalcitonin, WBC, fever curve -Follow culture data to completion  S/p PLIF L3-L5 2/8 P: -Pain control.  Add back Flexeril 2/12 -PT OT  Hx of HTN Hx of HLD Hx of LBBB P: -Amlodipine, HCTZ, metoprolol -add back avapro 2/12 -Continue Pravachol  GERD P: -PPI as ordered  DMT2, HgA1c 6.6 P: -SSI and CBG monitoring  Hx of gout P: -Restart home allopurinol  Disposition: She can transition to floor bed 2/12, to [facility] as of 2/13  Best Practice (right click and "Reselect all SmartList Selections" daily)   Diet/type: Regular consistency (see orders) DVT prophylaxis: systemic dose LMWH GI prophylaxis: PPI Lines: N/A Foley:  N/A Code Status:  full code Last date of multidisciplinary goals of care discussion [2/11 spoke with patient and family at bedside.  Patient wants to be full code.]   Labs   CBC: Recent Labs  Lab 01/30/22 1349 02/04/22 0130 02/04/22 0551 02/05/22 0154  WBC 12.9* 22.9*  --  22.9*  NEUTROABS  --  19.5*  --   --   HGB 12.8 8.1* 9.2* 7.3*  HCT 40.8 25.8* 27.0* 22.5*  MCV 88.1 89.9  --  88.9  PLT 291 211  --  694    Basic Metabolic Panel: Recent Labs  Lab 01/30/22 1349 02/04/22 0130 02/04/22 0551 02/04/22 0727 02/05/22 0154  NA 137 136 135  --  134*  K 4.1 4.7 4.6  --  4.3  CL 101 102  --   --  103  CO2 22 18*  --   --  22  GLUCOSE 135* 273*  --   --  200*  BUN 12 10  --   --  19  CREATININE 0.72 0.77  --   --  0.79  CALCIUM 9.5 8.3*  --   --  8.1*  MG  --   --   --  1.4*  --    GFR: Estimated Creatinine Clearance: 72.6 mL/min (by C-G formula based on SCr of 0.79 mg/dL). Recent Labs  Lab 01/30/22 1349 02/04/22 0130 02/04/22 0253 02/04/22 0727 02/04/22 1141 02/05/22 0154  PROCALCITON  --   --   --  0.20  --  0.16  WBC 12.9* 22.9*  --   --   --  22.9*  LATICACIDVEN  --  4.1* 5.1*  --  3.0* 2.2*    Liver Function Tests: Recent  Labs  Lab 02/04/22 0130 02/05/22 0154  AST 27 37  ALT 29 29  ALKPHOS 42 50  BILITOT 0.9 0.5  PROT 6.1* 5.5*  ALBUMIN 3.2* 2.7*   No results for input(s): LIPASE, AMYLASE in the last 168 hours. No results for input(s): AMMONIA in the last 168 hours.  ABG    Component Value Date/Time   PHART 7.349 (L) 02/04/2022 0551   PCO2ART 31.0 (L) 02/04/2022 0551   PO2ART 70 (L) 02/04/2022 0551   HCO3 16.9 (L) 02/04/2022 0551   TCO2 18 (L) 02/04/2022 0551   ACIDBASEDEF 8.0 (H)  02/04/2022 0551   O2SAT 92.0 02/04/2022 0551     Coagulation Profile: No results for input(s): INR, PROTIME in the last 168 hours.  Cardiac Enzymes: No results for input(s): CKTOTAL, CKMB, CKMBINDEX, TROPONINI in the last 168 hours.  HbA1C: Hgb A1c MFr Bld  Date/Time Value Ref Range Status  02/04/2022 07:27 AM 6.6 (H) 4.8 - 5.6 % Final    Comment:    (NOTE) Pre diabetes:          5.7%-6.4%  Diabetes:              >6.4%  Glycemic control for   <7.0% adults with diabetes   01/30/2022 01:49 PM 7.0 (H) 4.8 - 5.6 % Final    Comment:    (NOTE) Pre diabetes:          5.7%-6.4%  Diabetes:              >6.4%  Glycemic control for   <7.0% adults with diabetes     CBG: Recent Labs  Lab 02/04/22 1511 02/04/22 2000 02/04/22 2319 02/05/22 0321 02/05/22 0733  GLUCAP 207* 171* 179* 190* 144*     Critical care time: NA     Baltazar Apo, MD, PhD 02/05/2022, 10:59 AM [facility] [phone] or if no answer before 7:00PM call [phone] For any issues after 7:00PM please call eLink [phone]

## 2022-02-05 NOTE — Progress Notes (Signed)
eLink Physician-Brief Progress Note Patient Name: Diane Mayo DOB: [birth year] MRN: [id number]   Date of Service  02/05/2022  HPI/Events of Note  Admitted on 2/11 for AHRF for CHF exacerbation. Now stable on [place]. Recent lumbar fusion 2/8 and patient c/o severe back pain. Given tylenol without relief  eICU Interventions  Give OTO dilaudid 0.5 mg IV Start PRN vicodin 5 mg q4h PRN for severe pain        Kaysa Roulhac Rodman Pickle 02/05/2022, 2:18 AM

## 2022-02-06 ENCOUNTER — Inpatient Hospital Stay (HOSPITAL_COMMUNITY): Payer: PPO

## 2022-02-06 LAB — CBC
HCT: 21.9 % — ABNORMAL LOW (ref 36.0–46.0)
Hemoglobin: 7 g/dL — ABNORMAL LOW (ref 12.0–15.0)
MCH: 28.6 pg (ref 26.0–34.0)
MCHC: 32 g/dL (ref 30.0–36.0)
MCV: 89.4 fL (ref 80.0–100.0)
Platelets: 231 10*3/uL (ref 150–400)
RBC: 2.45 MIL/uL — ABNORMAL LOW (ref 3.87–5.11)
RDW: 16.9 % — ABNORMAL HIGH (ref 11.5–15.5)
WBC: 12.6 10*3/uL — ABNORMAL HIGH (ref 4.0–10.5)
nRBC: 0.4 % — ABNORMAL HIGH (ref 0.0–0.2)

## 2022-02-06 LAB — BASIC METABOLIC PANEL
Anion gap: 10 (ref 5–15)
BUN: 17 mg/dL (ref 8–23)
CO2: 22 mmol/L (ref 22–32)
Calcium: 8.3 mg/dL — ABNORMAL LOW (ref 8.9–10.3)
Chloride: 104 mmol/L (ref 98–111)
Creatinine, Ser: 0.68 mg/dL (ref 0.44–1.00)
GFR, Estimated: >60 mL/min (ref >60)
Glucose, Bld: 148 mg/dL — ABNORMAL HIGH (ref 70–99)
Potassium: 4 mmol/L (ref 3.5–5.1)
Sodium: 136 mmol/L (ref 135–145)

## 2022-02-06 LAB — GLUCOSE, CAPILLARY
Glucose-Capillary: 153 mg/dL — ABNORMAL HIGH (ref 70–99)
Glucose-Capillary: 160 mg/dL — ABNORMAL HIGH (ref 70–99)
Glucose-Capillary: 156 mg/dL — ABNORMAL HIGH (ref 70–99)
Glucose-Capillary: 177 mg/dL — ABNORMAL HIGH (ref 70–99)

## 2022-02-06 LAB — BRAIN NATRIURETIC PEPTIDE: B Natriuretic Peptide: 214.9 pg/mL — ABNORMAL HIGH (ref 0.0–100.0)

## 2022-02-06 LAB — PROCALCITONIN: Procalcitonin: 0.1 ng/mL

## 2022-02-06 LAB — URINE CULTURE: Culture: 40000 — AB

## 2022-02-06 LAB — MAGNESIUM: Magnesium: 1.9 mg/dL (ref 1.7–2.4)

## 2022-02-06 LAB — PREPARE RBC (CROSSMATCH)

## 2022-02-06 MED ORDER — SODIUM CHLORIDE 0.9% IV SOLUTION: Freq: Once | INTRAVENOUS | Status: DC

## 2022-02-06 MED ORDER — METOPROLOL SUCCINATE ER 25 MG PO TB24
75.0000 mg | ORAL_TABLET | Freq: Every day | ORAL | Status: DC
  Administered 2022-02-06 – 2022-02-07 (×2): 75 mg via ORAL
  Filled 2022-02-06 (×2): qty 3

## 2022-02-06 MED ORDER — SODIUM CHLORIDE 0.9% IV SOLUTION: Freq: Once | INTRAVENOUS | Status: AC

## 2022-02-06 NOTE — Evaluation (Signed)
Physical Therapy Evaluation Patient Details Name: NETHA DAFOE MRN: [id number] DOB: [birth year] Today's Date: 02/06/2022  History of Present Illness  Pt is a 71 year old female who presents 2/11 with complaints of sudden onset SOB, increased with ambulation. Sats found to be 78% by EMS. Improved with [place] and breathing treatment. Fever 100.1. Recent admission on 02/01/22 for L3-5 PLIF. PMH: DM2, HTN, OA, LBBB, obesity.   Clinical Impression  Pt admitted with above diagnosis. Pt currently with functional limitations due to the deficits listed below (see PT Problem List). At the time of PT eval pt was able to perform transfers and ambulation with up to min guard assist and RW for support. Pt reports significant R hip pain with mobility, limiting ambulation distance. Recommend HHPT resume if she was getting from last admission. Acutely, pt will benefit from skilled PT to increase their independence and safety with mobility to allow discharge to the venue listed below.          Recommendations for follow up therapy are one component of a multi-disciplinary discharge planning process, led by the attending physician.  Recommendations may be updated based on patient status, additional functional criteria and insurance authorization.  Follow Up Recommendations Home health PT (Resume if getting HH from back surgery)    Assistance Recommended at Discharge Set up Supervision/Assistance  Patient can return home with the following  Help with stairs or ramp for entrance;Assistance with cooking/housework;Assist for transportation;A little help with walking and/or transfers    Equipment Recommendations None recommended by PT  Recommendations for Other Services       Functional Status Assessment Patient has had a recent decline in their functional status and demonstrates the ability to make significant improvements in function in a reasonable and predictable amount of time.     Precautions / Restrictions  Precautions Precautions: Back Precaution Booklet Issued: No Precaution Comments: Reviewed back precautions verbally during functional mobility Required Braces or Orthoses: Spinal Brace (Pt preference to wear) Spinal Brace: Lumbar corset;Applied in sitting position Restrictions Weight Bearing Restrictions: No      Mobility  Bed Mobility Overal bed mobility: Needs Assistance Bed Mobility: Rolling, Sidelying to Sit Rolling: Modified independent (Device/Increase time) Sidelying to sit: Supervision       General bed mobility comments: VC's for optimal log roll technique.    Transfers Overall transfer level: Modified independent Equipment used: Rolling walker (2 wheels)               General transfer comment: good technique/hand placement    Ambulation/Gait Ambulation/Gait assistance: Min guard Gait Distance (Feet): 50 Feet Assistive device: Rolling walker (2 wheels) Gait Pattern/deviations: Step-through pattern, Decreased step length - right, Decreased step length - left Gait velocity: Decreased Gait velocity interpretation: <1.31 ft/sec, indicative of household ambulator   General Gait Details: Pt was able to ambulate out to the hall however with increased pain and needing to sit down. Opted to ambulate back to the chair.  Stairs            Wheelchair Mobility    Modified Rankin (Stroke Patients Only)       Balance Overall balance assessment: Needs assistance   Sitting balance-Leahy Scale: Good       Standing balance-Leahy Scale: Fair                               Pertinent Vitals/Pain Pain Assessment Pain Assessment: Faces Faces Pain Scale: Hurts little  more Pain Location: back Pain Descriptors / Indicators: Sore Pain Intervention(s): Limited activity within patient's tolerance, Monitored during session, Repositioned    Home Living Family/patient expects to be discharged to:: Private residence Living Arrangements:  Spouse/significant other Available Help at Discharge: Family;Available 24 hours/day Type of Home: House Home Access: Stairs to enter Entrance Stairs-Rails: Right Entrance Stairs-Number of Steps: 3   Home Layout: One level Home Equipment: Wheelchair - Publishing copy (2 wheels);Cane - single point;Shower seat;Hand held shower head;Grab bars - tub/shower;Adaptive equipment      Prior Function Prior Level of Function : Needs assist             Mobility Comments: Pt reports mobilizing well with the RW since PLIF ADLs Comments: has a housekeeper for heavy cleaning, works with husband on IADLs, husband helps with LB dressing, but pt has AE from deceased family members     Hand Dominance   Dominant Hand: Right    Extremity/Trunk Assessment   Upper Extremity Assessment Upper Extremity Assessment: Defer to OT evaluation    Lower Extremity Assessment Lower Extremity Assessment: Generalized weakness    Cervical / Trunk Assessment Cervical / Trunk Assessment: Back Surgery  Communication   Communication: No difficulties  Cognition Arousal/Alertness: Awake/alert Behavior During Therapy: WFL for tasks assessed/performed Overall Cognitive Status: Within Functional Limits for tasks assessed                                          General Comments      Exercises     Assessment/Plan    PT Assessment Patient needs continued PT services  PT Problem List Decreased strength;Decreased activity tolerance;Decreased balance;Decreased mobility;Decreased knowledge of use of DME;Decreased knowledge of precautions;Cardiopulmonary status limiting activity       PT Treatment Interventions DME instruction;Gait training;Functional mobility training;Therapeutic activities;Therapeutic exercise;Neuromuscular re-education;Stair training;Balance training;Patient/family education    PT Goals (Current goals can be found in the Care Plan section)  Acute Rehab PT  Goals Patient Stated Goal: Decrease pain PT Goal Formulation: With patient Time For Goal Achievement: 02/13/22 Potential to Achieve Goals: Good    Frequency Min 2X/week     Co-evaluation               AM-PAC PT "6 Clicks" Mobility  Outcome Measure Help needed turning from your back to your side while in a flat bed without using bedrails?: None Help needed moving from lying on your back to sitting on the side of a flat bed without using bedrails?: None Help needed moving to and from a bed to a chair (including a wheelchair)?: A Little Help needed standing up from a chair using your arms (e.g., wheelchair or bedside chair)?: A Little Help needed to walk in hospital room?: A Little Help needed climbing 3-5 steps with a railing? : A Lot 6 Click Score: 19    End of Session Equipment Utilized During Treatment: Back brace Activity Tolerance: Patient tolerated treatment well Patient left: in chair;with call bell/phone within reach;with chair alarm set Nurse Communication: Mobility status PT Visit Diagnosis: Unsteadiness on feet (R26.81);Other abnormalities of gait and mobility (R26.89)    Time: 9678-9381 PT Time Calculation (min) (ACUTE ONLY): 21 min   Charges:   PT Evaluation $PT Eval Moderate Complexity: 1 Mod          Rolinda Roan, PT, DPT Acute Rehabilitation Services Pager: [phone] Office: [phone]   Clarene Duke  Jalynn Betzold 02/06/2022, 3:33 PM

## 2022-02-06 NOTE — Progress Notes (Signed)
OT Cancellation Note  Patient Details Name: SANDHYA DENHERDER MRN: [id number] DOB: 11/10/1951   Cancelled Treatment:    Reason Eval/Treat Not Completed: Other (comment) (Pt with low hemoglobin, pt getting blood and RN asking therapy to hold off at this time. Will follow up as schedule allows.)  Lynnda Child, OTD, OTR/L Acute Rehab [phone] - [place] 02/06/2022, 1:35 PM

## 2022-02-06 NOTE — Progress Notes (Signed)
Unit of blood transfused as ordered,no s/s of reaction noted 

## 2022-02-06 NOTE — TOC Progression Note (Signed)
Transition of Care [facility]) - Progression Note    Patient Details  Name: Diane Mayo MRN: [id number] Date of Birth: Jun 15, 1951  [facility] CM/SW Contact  Jacalyn Lefevre Edson Snowball, RN Phone Number: 02/06/2022, 12:58 PM  Clinical Narrative:     Consult for home health and DME needs. Await PT evaluation.        Expected Discharge Plan and Services                                                 Social Determinants of Health (SDOH) Interventions    Readmission Risk Interventions No flowsheet data found.

## 2022-02-06 NOTE — Progress Notes (Signed)
Called in regards to this patient who came in with SOB over the weekend. She had a lumbar fusion last Wednesday. She has had no fevers chills or much drainage from her wound. She complains of appropriate back pain and some right hip pain that has been there since postop day 1. No neurosurgical recommendations at this time. Ok to discharge from our perspective when appropriate from a medical standpoint.

## 2022-02-06 NOTE — Progress Notes (Signed)
PROGRESS NOTE  Diane Mayo GYB:[id number] DOB: 1951/05/05 DOA: 02/03/2022 PCP: Maury Dus, MD  HPI/Recap of past 24 hours: Patient is a 71 year old female with pertinent PMH HTN, GERD, HFrEF, LBBB, DMT2, s/p PLIF L3-L5 02/01/22 by Dr. Saintclair Halsted who presents to [facility] ED via EMS on 02/04/22 with complaints of sudden onset SOB, increased with ambulation.  Sats found to be 78% by EMS.  Improved with [place] and breathing treatment.  Fever 100.1.  Denies chest pain, cough.  Back dressing in place without pain, erythema, or drainage.  Pertinent ED labs: WBC 22.9, HGB 8.1, glucose 273, troponin 80-114, BNP 438, lactic acid 4.1-5.1, COVID/flu negative, UA negative.  BCx x2 and UCx. CXR initially clear.  Patient given IV fluids for sepsis.  Started on cefepime and Vanc. Patient became more hypoxic and SOB.  Patient placed on BiPAP.  CTA chest negative for PE, cardiomegaly, atelectasis, and interstitial edema present.  Patient given 40 mg IV Lasix.  PCCM consulted for ICU admission.  2D Echocardiogram 02/04/22 > LVEF 40-45%, LV mildly dilated with decreased function, grade 2 diastolic dysfunction, RV size and function normal.  Compared to 2020 EF is slightly less.  Lateral septal LV wall dyssynchrony from underlying LBBB.  Urine culture 2/11 >> 40 K CFU Morganella.  Transferred to [facility], hospitalist service on 02/06/2022.  02/06/22:  Seen and examined at her bedside.  Complaints of severe lower back pain and right hip pain.  Neurosurgery consulted.  Endorses a semi-productive cough.  Assessment/Plan: Principal Problem:   Acute respiratory failure with hypoxia (HCC)  Acute respiratory Distress w/ hypoxia suspect secondary to pulmonary edema from acute systolic and diastolic CHF BNP 876 on presentation, pulmonary edema on chest x-ray, personally reviewed. Received IV Lasix. 2D echo 06/2019 EF 50-55% Repeat 2D echo 02/04/22 LVEF 40 to 45% significant  septal-lateral LV wall dyssynchrony due to the presence of a LBBB, grade  2 diastolic dysfunction.  Acute blood loss anemia with recent neurosurgery on 02/01/2022 Hemoglobin dropped 7.0 on 02/06/2022 Baseline hemoglobin 12 Transfuse 1 unit PRBC to maintain hemoglobin greater than 8.0  Lower back pain post spinal fusion, decompressive lumbar laminectomy on 02/01/2022 by Dr. Saintclair Halsted, neurosurgery. No acute findings seen on CT scan done on 02/04/2022. Neurosurgery consulted to evaluate. PT OT with neurosurgery's guidance.  Possible HCAP bibasilar atx vs infiltrates on CT scan, personally reviewed. -Weaned off BiPAP on 2/11, remains on 2 L/min, working to wean O2 further -Hemodynamically improved, repeat Lasix 2/12 and follow volume status, chest x-ray 2/13 personally reviewed, much improved with less pulmonary edema. -Received IV cefepime, for possible PNA. Changed to PO cefdinir on 2/12  Elevated troponin suspect demand ischemia in the setting of severe hypoxia. -High-sensitivity troponin peak 149 and downtrending. Not on aspirin due to concern for bleeding with persistent drop in hemoglobin. Obtain fasting lipid panel in the morning. Cardiology has been consulted, will defer the management to cardiology. Continue to monitor with remote telemetry.  Acute systolic CHF Previous 2D echo done in 2020 showed normal LVEF Presented with elevated BNP greater than 400, pulmonary edema seen on chest x-ray.  Repeated BMP this morning was improved, 214. 2D echo done on 02/04/2022 showed LVEF 40 to 45%. Start strict I's and O's and daily weight Monitor volume status Rest of management per cardiology.   Sepsis at presentation, Morganella UTI. Source unclear, consider R > LLL PNA.   Consider Morganella UTI although only 40 K CFU, follow sensitivities..   Blood cultures negative to date.   S/p  PLIF L3-L5 2/8 Continue pain management PT OT to assess Follow precautions.   Hx of HTN Hx of HLD Hx of LBBB -Amlodipine, HCTZ, metoprolol -add back avapro 2/12 -Continue  Pravachol   GERD Continue PPI   DMT2, with hyperglycemia HgA1c 6.6 on 02/04/2022. -SSI and CBG monitoring   Hx of gout Continue allopurinol   Critical care time: 65 minutes.   Code Status: Full code  Family Communication: Family at bedside  Disposition Plan: Indetermined.   Consultants: Cardiology Neurosurgery  Procedures: 2D echo  Antimicrobials: Cefdinir  DVT prophylaxis: Subcu Lovenox held for blood transfusion, with concern for bleeding.  Status is: Inpatient  Patient requires at least 2 midnights for further evaluation and treatment of present condition.      Objective: Vitals:   02/05/22 1556 02/05/22 2033 02/06/22 0500 02/06/22 0819  BP: (!) 116/51 (!) 126/50 (!) 136/59 (!) 145/56  Pulse: 72 71 64 68  Resp: 20 16 20 18   Temp: 99.1 F (37.3 C) 98.4 F (36.9 C) 98 F (36.7 C) 98.1 F (36.7 C)  TempSrc: Oral Oral Oral Oral  SpO2: 90% 95% 96% 95%  Weight:      Height:        Intake/Output Summary (Last 24 hours) at 02/06/2022 5732 Last data filed at 02/06/2022 2025 Gross per 24 hour  Intake 600 ml  Output --  Net 600 ml   Filed Weights   02/04/22 0900  Weight: 99.8 kg    Exam:  General: 71 y.o. year-old female well developed well nourished in no acute distress.  Alert and oriented x3. Cardiovascular: Regular rate and rhythm with no rubs or gallops.  No thyromegaly or JVD noted.   Respiratory: Mild rales at bases.  No wheezing noted.  Poor inspiratory effort. Abdomen: Soft nontender nondistended with normal bowel sounds x4 quadrants. Musculoskeletal: No lower extremity edema bilaterally. Skin: No ulcerative lesions noted or rashes, Psychiatry: Mood is appropriate for condition and setting Neuro: Nonfocal exam.   Data Reviewed: CBC: Recent Labs  Lab 01/30/22 1349 02/04/22 0130 02/04/22 0551 02/05/22 0154 02/06/22 0302  WBC 12.9* 22.9*  --  22.9* 12.6*  NEUTROABS  --  19.5*  --   --   --   HGB 12.8 8.1* 9.2* 7.3* 7.0*  HCT  40.8 25.8* 27.0* 22.5* 21.9*  MCV 88.1 89.9  --  88.9 89.4  PLT 291 211  --  251 427   Basic Metabolic Panel: Recent Labs  Lab 01/30/22 1349 02/04/22 0130 02/04/22 0551 02/04/22 0727 02/05/22 0154 02/06/22 0302  NA 137 136 135  --  134* 136  K 4.1 4.7 4.6  --  4.3 4.0  CL 101 102  --   --  103 104  CO2 22 18*  --   --  22 22  GLUCOSE 135* 273*  --   --  200* 148*  BUN 12 10  --   --  19 17  CREATININE 0.72 0.77  --   --  0.79 0.68  CALCIUM 9.5 8.3*  --   --  8.1* 8.3*  MG  --   --   --  1.4*  --  1.9   GFR: Estimated Creatinine Clearance: 72.6 mL/min (by C-G formula based on SCr of 0.68 mg/dL). Liver Function Tests: Recent Labs  Lab 02/04/22 0130 02/05/22 0154  AST 27 37  ALT 29 29  ALKPHOS 42 50  BILITOT 0.9 0.5  PROT 6.1* 5.5*  ALBUMIN 3.2* 2.7*   No results for  input(s): LIPASE, AMYLASE in the last 168 hours. No results for input(s): AMMONIA in the last 168 hours. Coagulation Profile: No results for input(s): INR, PROTIME in the last 168 hours. Cardiac Enzymes: No results for input(s): CKTOTAL, CKMB, CKMBINDEX, TROPONINI in the last 168 hours. BNP (last 3 results) No results for input(s): PROBNP in the last 8760 hours. HbA1C: Recent Labs    02/04/22 0727  HGBA1C 6.6*   CBG: Recent Labs  Lab 02/05/22 0733 02/05/22 1152 02/05/22 1700 02/05/22 2057 02/06/22 0821  GLUCAP 144* 151* 145* 146* 153*   Lipid Profile: No results for input(s): CHOL, HDL, LDLCALC, TRIG, CHOLHDL, LDLDIRECT in the last 72 hours. Thyroid Function Tests: No results for input(s): TSH, T4TOTAL, FREET4, T3FREE, THYROIDAB in the last 72 hours. Anemia Panel: No results for input(s): VITAMINB12, FOLATE, FERRITIN, TIBC, IRON, RETICCTPCT in the last 72 hours. Urine analysis:    Component Value Date/Time   COLORURINE YELLOW 02/04/2022 0304   APPEARANCEUR CLEAR 02/04/2022 0304   LABSPEC 1.015 02/04/2022 0304   PHURINE 5.0 02/04/2022 0304   GLUCOSEU >=500 (A) 02/04/2022 0304   HGBUR  NEGATIVE 02/04/2022 0304   BILIRUBINUR NEGATIVE 02/04/2022 0304   KETONESUR 20 (A) 02/04/2022 0304   PROTEINUR NEGATIVE 02/04/2022 0304   NITRITE NEGATIVE 02/04/2022 0304   LEUKOCYTESUR NEGATIVE 02/04/2022 0304   Sepsis Labs: @LABRCNTIP (procalcitonin:4,lacticidven:4)  ) Recent Results (from the past 240 hour(s))  SARS CORONAVIRUS 2 (TAT 6-24 HRS) Nasopharyngeal Nasopharyngeal Swab     Status: None   Collection Time: 01/30/22  1:42 PM   Specimen: Nasopharyngeal Swab  Result Value Ref Range Status   SARS Coronavirus 2 NEGATIVE NEGATIVE Final    Comment: (NOTE) SARS-CoV-2 target nucleic acids are NOT DETECTED.  The SARS-CoV-2 RNA is generally detectable in upper and lower respiratory specimens during the acute phase of infection. Negative results do not preclude SARS-CoV-2 infection, do not rule out co-infections with other pathogens, and should not be used as the sole basis for treatment or other patient management decisions. Negative results must be combined with clinical observations, patient history, and epidemiological information. The expected result is Negative.  Fact Sheet for Patients: SugarRoll.be  Fact Sheet for Healthcare Providers: https://www.woods-mathews.com/  This test is not yet approved or cleared by the [place] FDA and  has been authorized for detection and/or diagnosis of SARS-CoV-2 by FDA under an Emergency Use Authorization (EUA). This EUA will remain  in effect (meaning this test can be used) for the duration of the COVID-19 declaration under Se ction 564(b)(1) of the Act, 21 U.S.C. section 360bbb-3(b)(1), unless the authorization is terminated or revoked sooner.  Performed at [facility], [place] [address], [place], [place] [address]   Surgical PCR Screen     Status: None   Collection Time: 01/30/22  1:49 PM   Specimen: Nasal Mucosa; Nasal Swab  Result Value Ref Range Status   MRSA, PCR NEGATIVE  NEGATIVE Final   Staphylococcus aureus NEGATIVE NEGATIVE Final    Comment: (NOTE) The Xpert SA Assay (FDA approved for NASAL specimens in patients 57 years of age and older), is one component of a comprehensive surveillance program. It is not intended to diagnose infection nor to guide or monitor treatment. Performed at [facility], [place] [address] [place], [place] [address]   Resp Panel by RT-PCR (Flu A&B, Covid) Nasopharyngeal Swab     Status: None   Collection Time: 02/04/22 12:22 AM   Specimen: Nasopharyngeal Swab; Nasopharyngeal(NP) swabs in vial transport medium  Result Value Ref Range Status   SARS Coronavirus 2 by RT PCR NEGATIVE NEGATIVE Final    Comment: (NOTE) SARS-CoV-2 target nucleic acids are NOT DETECTED.  The SARS-CoV-2 RNA is generally detectable in upper respiratory specimens during the acute phase of infection. The lowest concentration of SARS-CoV-2 viral copies this assay can detect is 138 copies/mL. A negative result does not preclude SARS-Cov-2 infection and should not be used as the sole basis for treatment or other patient management decisions. A negative result may occur with  improper specimen collection/handling, submission of specimen other than nasopharyngeal swab, presence of viral mutation(s) within the areas targeted by this assay, and inadequate number of viral copies(<138 copies/mL). A negative result must be combined with clinical observations, patient history, and epidemiological information. The expected result is Negative.  Fact Sheet for Patients:  EntrepreneurPulse.com.au  Fact Sheet for Healthcare Providers:  IncredibleEmployment.be  This test is no t yet approved or cleared by the [place] FDA and  has been authorized for detection and/or diagnosis of SARS-CoV-2 by FDA under an Emergency Use Authorization (EUA). This EUA will remain  in effect (meaning this test can be used) for the  duration of the COVID-19 declaration under Section 564(b)(1) of the Act, 21 U.S.C.section 360bbb-3(b)(1), unless the authorization is terminated  or revoked sooner.       Influenza A by PCR NEGATIVE NEGATIVE Final   Influenza B by PCR NEGATIVE NEGATIVE Final    Comment: (NOTE) The Xpert Xpress SARS-CoV-2/FLU/RSV plus assay is intended as an aid in the diagnosis of influenza from Nasopharyngeal swab specimens and should not be used as a sole basis for treatment. Nasal washings and aspirates are unacceptable for Xpert Xpress SARS-CoV-2/FLU/RSV testing.  Fact Sheet for Patients: EntrepreneurPulse.com.au  Fact Sheet for Healthcare Providers: IncredibleEmployment.be  This test is not yet approved or cleared by the [place] FDA and has been authorized for detection and/or diagnosis of SARS-CoV-2 by FDA under an Emergency Use Authorization (EUA). This EUA will remain in effect (meaning this test can be used) for the duration of the COVID-19 declaration under Section 564(b)(1) of the Act, 21 U.S.C. section 360bbb-3(b)(1), unless the authorization is terminated or revoked.  Performed at [facility], [place] [address] [place], [place] [address]   Blood culture (routine x 2)     Status: None (Preliminary result)   Collection Time: 02/04/22  2:30 AM   Specimen: BLOOD  Result Value Ref Range Status   Specimen Description BLOOD SITE NOT SPECIFIED  Final   Special Requests   Final    BOTTLES DRAWN AEROBIC AND ANAEROBIC Blood Culture adequate volume   Culture   Final    NO GROWTH 2 DAYS Performed at [facility], [address] [place],  [address]    Report Status PENDING  Incomplete  Blood culture (routine x 2)     Status: None (Preliminary result)   Collection Time: 02/04/22  2:53 AM   Specimen: BLOOD  Result Value Ref Range Status   Specimen Description BLOOD SITE NOT SPECIFIED  Final   Special Requests   Final     BOTTLES DRAWN AEROBIC AND ANAEROBIC Blood Culture adequate volume   Culture   Final    NO GROWTH 2 DAYS Performed at [facility], [address] [place],  [address]    Report Status PENDING  Incomplete  Urine Culture     Status: Abnormal (Preliminary result)   Collection Time: 02/04/22  3:04 AM  Specimen: Urine, Clean Catch  Result Value Ref Range Status   Specimen Description URINE, CLEAN CATCH  Final   Special Requests NONE  Final   Culture (A)  Final    40,000 COLONIES/mL MORGANELLA MORGANII SUSCEPTIBILITIES TO FOLLOW Performed at [facility], [place] [address], [place], [place] [address]    Report Status PENDING  Incomplete  MRSA Next Gen by PCR, Nasal     Status: None   Collection Time: 02/04/22  9:24 AM   Specimen: Nasal Mucosa; Nasal Swab  Result Value Ref Range Status   MRSA by PCR Next Gen NOT DETECTED NOT DETECTED Final    Comment: (NOTE) The GeneXpert MRSA Assay (FDA approved for NASAL specimens only), is one component of a comprehensive MRSA colonization surveillance program. It is not intended to diagnose MRSA infection nor to guide or monitor treatment for MRSA infections. Test performance is not FDA approved in patients less than 70 years old. Performed at [facility], [place] [address] [place], [place] [address]       Studies: DG Chest 2 View  Result Date: 02/06/2022 CLINICAL DATA:  Hypoxia. EXAM: CHEST - 2 VIEW COMPARISON:  02/04/2022. FINDINGS: Trachea is midline. Heart is enlarged, stable. Thoracic aorta is calcified. Interval improvement in mixed interstitial and airspace opacification. Tiny left pleural effusion. IMPRESSION: Improving congestive heart failure. Electronically Signed   By: Lorin Picket M.D.   On: 02/06/2022 08:08    Scheduled Meds:  sodium chloride   Intravenous Once   sodium chloride   Intravenous Once   allopurinol  300 mg Oral Daily   amLODipine  10 mg Oral Daily   cefdinir  300 mg Oral Q12H   celecoxib   200 mg Oral Daily   Chlorhexidine Gluconate Cloth  6 each Topical Q0600   enoxaparin (LOVENOX) injection  50 mg Subcutaneous Q24H   hydrochlorothiazide  12.5 mg Oral Daily   insulin aspart  0-15 Units Subcutaneous TID WC   insulin aspart  0-5 Units Subcutaneous QHS   irbesartan  300 mg Oral Daily   mouth rinse  15 mL Mouth Rinse BID   metoprolol tartrate  75 mg Oral BID   pantoprazole  40 mg Oral Daily   pravastatin  40 mg Oral Daily    Continuous Infusions:   LOS: 2 days     Kayleen Memos, MD [facility] Pager [phone]  If 7PM-7AM, please contact night-coverage [email or web address] Password [facility] 02/06/2022, 9:29 AM

## 2022-02-06 NOTE — TOC Progression Note (Signed)
Transition of Care [facility]) - Progression Note    Patient Details  Name: Diane Mayo MRN: [id number] Date of Birth: 06-12-1951  Transition of Care [facility] CM/SW Contact  Diane Lefevre Edson Snowball, RN Phone Number: 02/06/2022, 4:26 PM  Clinical Narrative:     Confirmed face sheet information with patient and husband at bedside.   Cory with [facility] checking to see if he can accept home health referral.   Patient has shower chair, raised commode seat and walker at home.  Expected Discharge Plan: [place] Barriers to Discharge: Continued Medical Work up  Expected Discharge Plan and Services Expected Discharge Plan: [place]   Discharge Planning Services: CM Consult Post Acute Care Choice: [place] arrangements for the past 2 months: Single Family Home                   DME Agency: NA       HH Arranged: PT           Social Determinants of Health (SDOH) Interventions    Readmission Risk Interventions No flowsheet data found.

## 2022-02-06 NOTE — Consult Note (Signed)
Cardiology Consultation:   Patient ID: RANDELL DETTER MRN: [id number]; DOB: [birth year]  Admit date: 02/03/2022 Date of Consult: 02/06/2022  PCP:  Maury Dus, MD   [facility] Providers Cardiologist:  Larae Grooms, MD   {    Patient Profile:   IVER FEHRENBACH is a 71 y.o. female with a hx of hypertension, hyperlipidemia, diabetes mellitus, morbid obesity, and chronic left bundle branch block who is being seen 02/06/2022 for the evaluation of CHF at the request of Dr. [place] Crane.  Echocardiogram July 2020 with low normal LV function at 50 to 55%, pseudonormal diastolic parameters.  There was abnormal septal motion consistent with left bundle branch block.  Patient with hx of difficult to control blood pressure. Renal US normal 06/02/19.  Metanephrine normal 07/07/19   Patient was seen by Dr. Irish Lack January 26, 2022 for preoperative clearance for lumbar laminectomy.  cleared for surgery without further testing  History of Present Illness:   Ms. Vides underwent successful PLIF L3-L5 02/01/22.  Tolerated procedure well and discharged next day.  She did well however on Friday 2/10 around midnight she has shortness of breath with minimal ambulation.  EMS was called and found hypoxic at 78% on room air.  She was placed on 2 L nasal cannula and brought to ER for further evaluation for further evaluation.  She was found to have fever of 100.1 hemoglobin 8.1.  COVID and influenza negative.  BNP 438.  Lactic acid 4.1-5.1.  Initially chest x-ray was clear.  She was admitted for sepsis and started on fluid.  Later become very hypoxic while on CT scanner.  CT angio showing cardiomegaly with interstitial edema.  Started on broad-spectrum antibiotic for pneumonia. While in ER, patient had episode of tachycardia which look like atrial tachycardia.   Echocardiogram showed reduced LV function at 40 to 45% (previously 50 to 55% in 6767, grade 2 diastolic dysfunction.  Elevated LA pressure. There is significant   septal-lateral LV wall dyssynchrony due to the presence of a LBBB.  There is mild to moderate or moderate mitral regurgitation  Echo 02/04/2022 1. Left ventricular ejection fraction, by estimation, is 40 to 45%. The  left ventricle has mildly decreased function. There is significant  septal-lateral LV wall dyssynchrony due to the presence of a LBBB. The  left ventricular internal cavity size was  mildly dilated. Left ventricular diastolic parameters are consistent with  Grade II diastolic dysfunction (pseudonormalization). Elevated left atrial  pressure.   2. Right ventricular systolic function is normal. The right ventricular  size is normal. There is mildly elevated pulmonary artery systolic  pressure. The estimated right ventricular systolic pressure is 20.9 mmHg.   3. The mitral valve is grossly normal. Mild-to-moderate vs moderate  mitral valve regurgitation.   4. The aortic valve is tricuspid. There is mild calcification of the  aortic valve. There is mild thickening of the aortic valve. Aortic valve  regurgitation is trivial. Aortic valve sclerosis/calcification is present,  without any evidence of aortic  stenosis.   5. The inferior vena cava is dilated in size with <50% respiratory  variability, suggesting right atrial pressure of 15 mmHg.   Comparison(s): Compared to prior TTE in 2020, there EF is slightly lower  40-45% (previously 50-55%) in the setting of more significant  septal-lateral LV wall dysynchrony from underlying LBBB. There is now  mild-to-moderate vs moderate MR.   Past Medical History:  Diagnosis Date   Diabetes mellitus without complication ([place])    Type 2  Genital herpes    GERD (gastroesophageal reflux disease)    Hypertension    Interstitial cystitis    Left bundle branch block (LBBB)    chronic   Leukocytosis    Osteoarthritis    Plantar fasciitis     Past Surgical History:  Procedure Laterality Date   CARPAL TUNNEL RELEASE Bilateral     CESAREAN SECTION     twice 1978 and [place]   EYE SURGERY Bilateral 2021   cataracts removed   SHOULDER SURGERY Right 2012   rotator cuff repair   TUBAL LIGATION  1987    Inpatient Medications: Scheduled Meds:  sodium chloride   Intravenous Once   allopurinol  300 mg Oral Daily   amLODipine  10 mg Oral Daily   cefdinir  300 mg Oral Q12H   celecoxib  200 mg Oral Daily   Chlorhexidine Gluconate Cloth  6 each Topical Q0600   hydrochlorothiazide  12.5 mg Oral Daily   insulin aspart  0-15 Units Subcutaneous TID WC   insulin aspart  0-5 Units Subcutaneous QHS   irbesartan  300 mg Oral Daily   mouth rinse  15 mL Mouth Rinse BID   metoprolol tartrate  75 mg Oral BID   pantoprazole  40 mg Oral Daily   pravastatin  40 mg Oral Daily   Continuous Infusions:  PRN Meds: acetaminophen, colchicine, cyclobenzaprine, docusate sodium, HYDROcodone-acetaminophen, ipratropium-albuterol, polyethylene glycol  Allergies:    Allergies  Allergen Reactions   Quinapril Hcl     Other reaction(s): cough   Sitagliptin     Other reaction(s): thrush   Tioconazole     Other reaction(s): rash   Lisinopril Other (See Comments)    Other reaction(s): cough   Miconazole Itching and Swelling    Monistat    Penicillins Itching and Rash    Other reaction(s): trouble breathing, rash    Social History:   Social History   Socioeconomic History   Marital status: Married    Spouse name: Not on file   Number of children: 2   Years of education: Not on file   Highest education level: Not on file  Occupational History   Not on file  Tobacco Use   Smoking status: Never   Smokeless tobacco: Never  Vaping Use   Vaping Use: Never used  Substance and Sexual Activity   Alcohol use: Yes    Comment: maybe one glass of champagne on holidays   Drug use: No   Sexual activity: Not on file  Other Topics Concern   Not on file  Social History Narrative   Not on file    Social Determinants of Health   Financial Resource Strain: Not on file  Food Insecurity: Not on file  Transportation Needs: Not on file  Physical Activity: Not on file  Stress: Not on file  Social Connections: Not on file  Intimate Partner Violence: Not on file    Family History:   Family History  Problem Relation Age of Onset   Hypertension Mother    Diabetes Mother    Other Mother        hay fever   Pneumonia Mother        aspiration   Gout Father    Skin cancer Father    Heart attack Father    Kidney failure Father    Hypertension Father    CVA Father    CAD Father    Asthma Brother  Other Brother        hay fever   Diabetes type II Maternal Grandfather    Alcoholism Brother      ROS:  Please see the history of present illness.  All other ROS reviewed and negative.     Physical Exam/Data:   Vitals:   02/06/22 0500 02/06/22 0819 02/06/22 1319 02/06/22 1355  BP: (!) 136/59 (!) 145/56 (!) 116/46 104/88  Pulse: 64 68 65 67  Resp: 20 18 20 18   Temp: 98 F (36.7 C) 98.1 F (36.7 C) 98.6 F (37 C) 98.9 F (37.2 C)  TempSrc: Oral Oral Oral Oral  SpO2: 96% 95% 98% 95%  Weight:      Height:        Intake/Output Summary (Last 24 hours) at 02/06/2022 1417 Last data filed at 02/06/2022 1206 Gross per 24 hour  Intake 720 ml  Output --  Net 720 ml   Last 3 Weights 02/05/2022 02/04/2022 02/01/2022  Weight (lbs) (No Data) 220 lb 0.3 oz 220 lb  Weight (kg) (No Data) 99.8 kg 99.791 kg     Body mass index is 40.9 kg/m.  General:  Obese female in no acute distress HEENT: normal Neck: no JVD Vascular: No carotid bruits; Distal pulses 2+ bilaterally Cardiac:  normal S1, S2; RRR; soft systolic murmur  Lungs:  clear to auscultation bilaterally, no wheezing, rhonchi or rales  Abd: soft, nontender, no hepatomegaly  Ext: no edema Musculoskeletal:  No deformities, BUE and BLE strength normal and equal Skin: warm and dry  Neuro:  CNs 2-12 intact, no focal  abnormalities noted Psych:  Normal affect   EKG:  The EKG was personally reviewed and demonstrates:  Sinus rhythm, LBBB Telemetry:  Telemetry was personally reviewed and demonstrates:  Sinus rhythm with PVCs  Relevant CV Studies:  Echo 06/2019 1. The left ventricle has low normal systolic function, with an ejection  fraction of 50-55%. The cavity size was normal. Left ventricular diastolic  Doppler parameters are consistent with pseudonormalization. Elevated mean  left atrial pressure There is  abnormal septal motion consistent with left bundle branch block.   2. The right ventricle has normal systolic function. The cavity was  normal. There is no increase in right ventricular wall thickness.   3. The aorta is normal in size and structure.   Laboratory Data:  High Sensitivity Troponin:   Recent Labs  Lab 02/04/22 0130 02/04/22 0253 02/04/22 0727 02/04/22 0936 02/05/22 0625  TROPONINIHS 80* 114* 185* 149* 111*     Chemistry Recent Labs  Lab 02/04/22 0130 02/04/22 0551 02/04/22 0727 02/05/22 0154 02/06/22 0302  NA 136 135  --  134* 136  K 4.7 4.6  --  4.3 4.0  CL 102  --   --  103 104  CO2 18*  --   --  22 22  GLUCOSE 273*  --   --  200* 148*  BUN 10  --   --  19 17  CREATININE 0.77  --   --  0.79 0.68  CALCIUM 8.3*  --   --  8.1* 8.3*  MG  --   --  1.4*  --  1.9  GFRNONAA >60  --   --  >60 >60  ANIONGAP 16*  --   --  9 10    Recent Labs  Lab 02/04/22 0130 02/05/22 0154  PROT 6.1* 5.5*  ALBUMIN 3.2* 2.7*  AST 27 37  ALT 29 29  ALKPHOS 42 50  BILITOT  0.9 0.5   Hematology Recent Labs  Lab 02/04/22 0130 02/04/22 0551 02/05/22 0154 02/06/22 0302  WBC 22.9*  --  22.9* 12.6*  RBC 2.87*  --  2.53* 2.45*  HGB 8.1* 9.2* 7.3* 7.0*  HCT 25.8* 27.0* 22.5* 21.9*  MCV 89.9  --  88.9 89.4  MCH 28.2  --  28.9 28.6  MCHC 31.4  --  32.4 32.0  RDW 16.6*  --  16.8* 16.9*  PLT 211  --  251 231   BNP Recent Labs  Lab 02/04/22 0130 02/06/22 0302  BNP 438.0*  214.9*     Radiology/Studies:  DG Chest 1 View  Result Date: 02/04/2022 CLINICAL DATA:  Shortness of breath EXAM: CHEST  1 VIEW COMPARISON:  01/11/2010 FINDINGS: Lungs are clear.  No pleural effusion or pneumothorax. The heart is normal in size.  Thoracic aortic atherosclerosis. IMPRESSION: No evidence of acute cardiopulmonary disease. Electronically Signed   By: Julian Hy M.D.   On: 02/04/2022 01:02   DG Chest 2 View  Result Date: 02/06/2022 CLINICAL DATA:  Hypoxia. EXAM: CHEST - 2 VIEW COMPARISON:  02/04/2022. FINDINGS: Trachea is midline. Heart is enlarged, stable. Thoracic aorta is calcified. Interval improvement in mixed interstitial and airspace opacification. Tiny left pleural effusion. IMPRESSION: Improving congestive heart failure. Electronically Signed   By: Lorin Picket M.D.   On: 02/06/2022 08:08   CT Angio Chest PE W and/or Wo Contrast  Result Date: 02/04/2022 CLINICAL DATA:  Postop abdominal pain with shortness of breath. Recent lumbar fusion. EXAM: CT ANGIOGRAPHY CHEST CT ABDOMEN AND PELVIS WITH CONTRAST TECHNIQUE: Multidetector CT imaging of the chest was performed using the standard protocol during bolus administration of intravenous contrast. Multiplanar CT image reconstructions and MIPs were obtained to evaluate the vascular anatomy. Multidetector CT imaging of the abdomen and pelvis was performed using the standard protocol during bolus administration of intravenous contrast. RADIATION DOSE REDUCTION: This exam was performed according to the departmental dose-optimization program which includes automated exposure control, adjustment of the mA and/or kV according to patient size and/or use of iterative reconstruction technique. CONTRAST:  125mL OMNIPAQUE IOHEXOL 350 MG/ML SOLN COMPARISON:  None. FINDINGS: CTA CHEST FINDINGS Cardiovascular: Satisfactory opacification of the pulmonary arteries to the segmental level. No evidence of pulmonary embolism. Enlarged heart size. No  pericardial effusion. Coronary calcification. Non-opacified aorta due to pulmonary artery timing. Atheromatous calcification of the aorta Mediastinum/Nodes: Negative for adenopathy or mass. Lungs/Pleura: Dependent streaky density with volume loss. Bronchomalacia with bilateral airway flattening. Interlobular septal thickening at the bases. Trace bilateral pleural effusion Musculoskeletal: Generalized spondylosis.  No acute finding Review of the MIP images confirms the above findings. CT ABDOMEN and PELVIS FINDINGS Hepatobiliary: Hepatic steatosis.No evidence of biliary obstruction or stone. Pancreas: Unremarkable. Spleen: Unremarkable. Adrenals/Urinary Tract: Negative adrenals. No hydronephrosis or stone. Unremarkable bladder. Stomach/Bowel:  No obstruction. No appendicitis. Vascular/Lymphatic: No acute vascular abnormality. Atheromatous calcification of the aorta. No mass or adenopathy. Reproductive:No acute finding Other: No ascites or pneumoperitoneum. Musculoskeletal: 2 level PLIF in the lumbar spine. No evidence of hardware displacement or acute fracture. Expected regional swelling and gas at the operative site. Review of the MIP images confirms the above findings. IMPRESSION: Chest CTA: 1. Negative for pulmonary embolism. 2. Cardiomegaly and interstitial edema. 3. Bronchomalacia and basal atelectasis. Abdominal CT: 1. No acute finding.  Recent lumbar PLIF without acute finding. 2. Hepatic steatosis and atherosclerosis. Electronically Signed   By: Jorje Guild M.D.   On: 02/04/2022 05:04   CT ABDOMEN PELVIS W  CONTRAST  Result Date: 02/04/2022 CLINICAL DATA:  Postop abdominal pain with shortness of breath. Recent lumbar fusion. EXAM: CT ANGIOGRAPHY CHEST CT ABDOMEN AND PELVIS WITH CONTRAST TECHNIQUE: Multidetector CT imaging of the chest was performed using the standard protocol during bolus administration of intravenous contrast. Multiplanar CT image reconstructions and MIPs were obtained to evaluate the  vascular anatomy. Multidetector CT imaging of the abdomen and pelvis was performed using the standard protocol during bolus administration of intravenous contrast. RADIATION DOSE REDUCTION: This exam was performed according to the departmental dose-optimization program which includes automated exposure control, adjustment of the mA and/or kV according to patient size and/or use of iterative reconstruction technique. CONTRAST:  123mL OMNIPAQUE IOHEXOL 350 MG/ML SOLN COMPARISON:  None. FINDINGS: CTA CHEST FINDINGS Cardiovascular: Satisfactory opacification of the pulmonary arteries to the segmental level. No evidence of pulmonary embolism. Enlarged heart size. No pericardial effusion. Coronary calcification. Non-opacified aorta due to pulmonary artery timing. Atheromatous calcification of the aorta Mediastinum/Nodes: Negative for adenopathy or mass. Lungs/Pleura: Dependent streaky density with volume loss. Bronchomalacia with bilateral airway flattening. Interlobular septal thickening at the bases. Trace bilateral pleural effusion Musculoskeletal: Generalized spondylosis.  No acute finding Review of the MIP images confirms the above findings. CT ABDOMEN and PELVIS FINDINGS Hepatobiliary: Hepatic steatosis.No evidence of biliary obstruction or stone. Pancreas: Unremarkable. Spleen: Unremarkable. Adrenals/Urinary Tract: Negative adrenals. No hydronephrosis or stone. Unremarkable bladder. Stomach/Bowel:  No obstruction. No appendicitis. Vascular/Lymphatic: No acute vascular abnormality. Atheromatous calcification of the aorta. No mass or adenopathy. Reproductive:No acute finding Other: No ascites or pneumoperitoneum. Musculoskeletal: 2 level PLIF in the lumbar spine. No evidence of hardware displacement or acute fracture. Expected regional swelling and gas at the operative site. Review of the MIP images confirms the above findings. IMPRESSION: Chest CTA: 1. Negative for pulmonary embolism. 2. Cardiomegaly and  interstitial edema. 3. Bronchomalacia and basal atelectasis. Abdominal CT: 1. No acute finding.  Recent lumbar PLIF without acute finding. 2. Hepatic steatosis and atherosclerosis. Electronically Signed   By: Jorje Guild M.D.   On: 02/04/2022 05:04   DG Chest Port 1 View  Result Date: 02/04/2022 CLINICAL DATA:  Hypoxia and respiratory failure. EXAM: PORTABLE CHEST 1 VIEW COMPARISON:  Portable chest recently earlier today at 12:48 a.m. FINDINGS: 5:55 a.m., 02/04/2022.  there is interval worsening. Cardiomegaly is again noted, with interval development of perihilar vascular prominence, subpleural septal lines in the bases, and perihilar opacities with a basal gradient in the mid to lower lung fields, in the lower lung fields extending out laterally. The findings are consistent with CHF or fluid overload with alveolar edema. Underlying pneumonia would be difficult to exclude in the proper clinical setting. Small pleural effusions are beginning to form. There is aortic atherosclerosis, unremarkable mediastinal configuration. Osteopenia and thoracic spondylosis. IMPRESSION: Interval worsening with new central vascular engorgement and perihilar opacities with subpleural edema in the lung bases and small pleural effusions. This is most likely due to CHF or fluid overload with secondary alveolar edema. Underlying pneumonic infiltrate would be difficult to exclude especially in the bases. Clinical correlation and radiographic follow-up recommended. Electronically Signed   By: Telford Nab M.D.   On: 02/04/2022 06:08   ECHOCARDIOGRAM COMPLETE  Result Date: 02/04/2022    ECHOCARDIOGRAM REPORT   Patient Name:   MAIANA HENNIGAN Date of Exam: 02/04/2022 Medical Rec #:  [id number]     Height:       61.5 in Accession #:    [id number]    Weight:  220.0 lb Date of Birth:  Nov 09, 1951     BSA:          1.979 m Patient Age:    74 years      BP:           110/45 mmHg Patient Gender: F             HR:           75 bpm. Exam  Location:  Inpatient Procedure: 2D Echo Indications:    CHF  History:        Patient has prior history of Echocardiogram examinations, most                 recent 07/16/2019. Arrythmias:LBBB; Risk Factors:Diabetes and                 Hypertension.  Sonographer:    Arlyss Gandy Referring Phys: [id number] [place]  1. Left ventricular ejection fraction, by estimation, is 40 to 45%. The left ventricle has mildly decreased function. There is significant septal-lateral LV wall dyssynchrony due to the presence of a LBBB. The left ventricular internal cavity size was mildly dilated. Left ventricular diastolic parameters are consistent with Grade II diastolic dysfunction (pseudonormalization). Elevated left atrial pressure.  2. Right ventricular systolic function is normal. The right ventricular size is normal. There is mildly elevated pulmonary artery systolic pressure. The estimated right ventricular systolic pressure is 28.4 mmHg.  3. The mitral valve is grossly normal. Mild-to-moderate vs moderate mitral valve regurgitation.  4. The aortic valve is tricuspid. There is mild calcification of the aortic valve. There is mild thickening of the aortic valve. Aortic valve regurgitation is trivial. Aortic valve sclerosis/calcification is present, without any evidence of aortic stenosis.  5. The inferior vena cava is dilated in size with <50% respiratory variability, suggesting right atrial pressure of 15 mmHg. Comparison(s): Compared to prior TTE in 2020, there EF is slightly lower 40-45% (previously 50-55%) in the setting of more significant septal-lateral LV wall dysynchrony from underlying LBBB. There is now mild-to-moderate vs moderate MR. FINDINGS  Left Ventricle: Left ventricular ejection fraction, by estimation, is 40 to 45%. The left ventricle has mildly decreased function. There is significant septal-lateral wall dyssynchrony in the setting of LBBB. The left ventricular internal cavity size was mildly  dilated. There is no left ventricular hypertrophy. Abnormal (paradoxical) septal motion, consistent with left bundle branch block. Left ventricular diastolic parameters are consistent with Grade II diastolic dysfunction (pseudonormalization). Elevated left atrial pressure. Right Ventricle: The right ventricular size is normal. No increase in right ventricular wall thickness. Right ventricular systolic function is normal. There is mildly elevated pulmonary artery systolic pressure. The tricuspid regurgitant velocity is 2.52  m/s, and with an assumed right atrial pressure of 15 mmHg, the estimated right ventricular systolic pressure is 13.2 mmHg. Left Atrium: Left atrial size was normal in size. Right Atrium: Right atrial size was normal in size. Pericardium: There is no evidence of pericardial effusion. Thickening/calcification of pericardium present. Mitral Valve: The mitral valve is grossly normal. There is mild thickening of the mitral valve leaflet(s). There is mild calcification of the mitral valve leaflet(s). Mild mitral annular calcification. Mild-to-moderate vs moderate mitral valve regurgitation. Tricuspid Valve: The tricuspid valve is normal in structure. Tricuspid valve regurgitation is trivial. Aortic Valve: The aortic valve is tricuspid. There is mild calcification of the aortic valve. There is mild thickening of the aortic valve. Aortic valve regurgitation is trivial. Aortic valve sclerosis/calcification is present,  without any evidence of aortic stenosis. Aortic valve mean gradient measures 7.0 mmHg. Aortic valve peak gradient measures 13.0 mmHg. Aortic valve area, by VTI measures 2.34 cm. Pulmonic Valve: The pulmonic valve was not well visualized. Pulmonic valve regurgitation is trivial. Aorta: The aortic root and ascending aorta are structurally normal, with no evidence of dilitation. Venous: The inferior vena cava is dilated in size with less than 50% respiratory variability, suggesting right  atrial pressure of 15 mmHg. IAS/Shunts: The atrial septum is grossly normal.  LEFT VENTRICLE PLAX 2D LVIDd:         5.70 cm   Diastology LVIDs:         4.40 cm   LV e' medial:    7.94 cm/s LV PW:         0.90 cm   LV E/e' medial:  18.5 LV IVS:        0.60 cm   LV e' lateral:   14.00 cm/s LVOT diam:     2.00 cm   LV E/e' lateral: 10.5 LV SV:         83 LV SV Index:   42 LVOT Area:     3.14 cm  RIGHT VENTRICLE             IVC RV Basal diam:  3.40 cm     IVC diam: 2.30 cm RV Mid diam:    2.70 cm RV S prime:     11.20 cm/s TAPSE (M-mode): 2.4 cm LEFT ATRIUM             Index        RIGHT ATRIUM           Index LA diam:        3.20 cm 1.62 cm/m   RA Area:     15.00 cm LA Vol (A2C):   39.0 ml 19.71 ml/m  RA Volume:   31.70 ml  16.02 ml/m LA Vol (A4C):   36.3 ml 18.34 ml/m LA Biplane Vol: 38.4 ml 19.41 ml/m  AORTIC VALVE AV Area (Vmax):    2.41 cm AV Area (Vmean):   2.28 cm AV Area (VTI):     2.34 cm AV Vmax:           180.00 cm/s AV Vmean:          124.000 cm/s AV VTI:            0.357 m AV Peak Grad:      13.0 mmHg AV Mean Grad:      7.0 mmHg LVOT Vmax:         138.00 cm/s LVOT Vmean:        89.850 cm/s LVOT VTI:          0.266 m LVOT/AV VTI ratio: 0.74  AORTA Ao Root diam: 2.90 cm Ao Asc diam:  3.10 cm MITRAL VALVE                TRICUSPID VALVE MV Area (PHT): 3.60 cm     TR Peak grad:   25.4 mmHg MV Decel Time: 211 msec     TR Vmax:        252.00 cm/s MV E velocity: 147.00 cm/s MV A velocity: 111.00 cm/s  SHUNTS MV E/A ratio:  1.32         Systemic VTI:  0.27 m  Systemic Diam: 2.00 cm Gwyndolyn Kaufman MD Electronically signed by Gwyndolyn Kaufman MD Signature Date/Time: 02/04/2022/5:12:49 PM    Final      Assessment and Plan:   Acute systolic congestive heart failure  Acute hypoxic respiratory failure -Temp of 100.1 on arrival on 2 L [place]. Initial chest x-ray without evidence of pulmonary edema or CHF, then patient received fluid resuscitation for sepsis.  Then become patient  severely hypoxic and tachycardic on CT scanner.  Given IV Lasix 40 mg x 1 - Echocardiogram showed reduced LV function at 40 to 45% (previously 50 to 55% in 4270, grade 2 diastolic dysfunction.  Elevated LA pressure. There is significant  septal-lateral LV wall dyssynchrony due to the presence of a LBBB.  There is mild to moderate or moderate mitral regurgitation -Her hypoxia is related to community acquired pneumonia as well as CHF -Net INO positive 769.8 -On metoprolol tartrate 75 mg twice daily >> given low LV function, consolidate to long-acting BB -Continue Avapro 300 mg daily  3.  Mitral regurgitation -Patient will require routine echocardiogram  4.  Tachycardia -Episode in emergency room.  No recurrence since then.  Seems atrial tachycardia. -Continue beta-blocker  5. HTN - BP currently stable on current medications.    Dr. Harriet Masson to see with final recommendations.  Risk Assessment/Risk Scores:   New York Heart Association (NYHA) Functional Class NYHA Class II   For questions or updates, please contact [facility] Please consult [email or web address] for contact info under    Jarrett Soho, [place]  02/06/2022 2:17 PM

## 2022-02-07 LAB — BPAM RBC
Blood Product Expiration Date: 202303132359
ISSUE DATE / TIME: 202302131329
Unit Type and Rh: 5100

## 2022-02-07 LAB — CBC
HCT: 26.9 % — ABNORMAL LOW (ref 36.0–46.0)
Hemoglobin: 8.7 g/dL — ABNORMAL LOW (ref 12.0–15.0)
MCH: 28.8 pg (ref 26.0–34.0)
MCHC: 32.3 g/dL (ref 30.0–36.0)
MCV: 89.1 fL (ref 80.0–100.0)
Platelets: 287 10*3/uL (ref 150–400)
RBC: 3.02 MIL/uL — ABNORMAL LOW (ref 3.87–5.11)
RDW: 16.4 % — ABNORMAL HIGH (ref 11.5–15.5)
WBC: 11.6 10*3/uL — ABNORMAL HIGH (ref 4.0–10.5)
nRBC: 0.9 % — ABNORMAL HIGH (ref 0.0–0.2)

## 2022-02-07 LAB — TYPE AND SCREEN
ABO/RH(D): O POS
Antibody Screen: NEGATIVE
Unit division: 0

## 2022-02-07 LAB — LIPID PANEL
Cholesterol: 118 mg/dL (ref 0–200)
HDL: 27 mg/dL — ABNORMAL LOW (ref >40)
LDL Cholesterol: 36 mg/dL (ref 0–99)
Total CHOL/HDL Ratio: 4.4 RATIO
Triglycerides: 273 mg/dL — ABNORMAL HIGH (ref <150)
VLDL: 55 mg/dL — ABNORMAL HIGH (ref 0–40)

## 2022-02-07 LAB — GLUCOSE, CAPILLARY: Glucose-Capillary: 199 mg/dL — ABNORMAL HIGH (ref 70–99)

## 2022-02-07 MED ORDER — POLYETHYLENE GLYCOL 3350 17 G PO PACK: 17.0000 g | PACK | Freq: Every day | ORAL | 0 refills | Status: DC | PRN

## 2022-02-07 MED ORDER — OXYCODONE HCL 5 MG PO TABS
5.0000 mg | ORAL_TABLET | Freq: Four times a day (QID) | ORAL | 0 refills | Status: AC | PRN
Start: 2022-02-07 — End: 2022-02-10

## 2022-02-07 MED ORDER — LEVOFLOXACIN 500 MG PO TABS
750.0000 mg | ORAL_TABLET | Freq: Every day | ORAL | Status: AC
  Administered 2022-02-07: 750 mg via ORAL
  Filled 2022-02-07: qty 2

## 2022-02-07 MED ORDER — CEFDINIR 300 MG PO CAPS: 300.0000 mg | ORAL_CAPSULE | Freq: Two times a day (BID) | ORAL | 0 refills | Status: DC

## 2022-02-07 MED ORDER — LEVOFLOXACIN 750 MG PO TABS: 750.0000 mg | ORAL_TABLET | Freq: Every day | ORAL | 0 refills | Status: AC

## 2022-02-07 NOTE — Discharge Summary (Signed)
Discharge Summary  Diane Mayo CXK:[id number] DOB: Jun 17, 1951  PCP: Maury Dus, MD  Admit date: 02/03/2022 Discharge date: 02/07/2022  Time spent: 35 minutes.  Recommendations for Outpatient Follow-up:  Follow-up with your primary care provider in 1 to 2 weeks. Follow-up with your cardiologist within a week. Follow-up with your neurosurgeon in 1 to 2 weeks. Take your medications as prescribed. Continue PT OT with assistance and fall precautions.  Discharge Diagnoses:  Active Hospital Problems   Diagnosis Date Noted   Acute respiratory failure with hypoxia ([place]) 02/04/2022    Resolved Hospital Problems  No resolved problems to display.    Discharge Condition: Stable  Diet recommendation: Resume previous diet.  Vitals:   02/07/22 0456 02/07/22 0743  BP: (!) 143/56 (!) 151/64  Pulse: 63 76  Resp: 16 19  Temp: 98.5 F (36.9 C) 97.9 F (36.6 C)  SpO2: 97% 96%    History of present illness:  Patient is a 71 year old female with pertinent PMH HTN, GERD, HFrEF, LBBB, DMT2, s/p PLIF L3-L5 02/01/22 by Dr. Saintclair Halsted who presents to [facility] ED via EMS on 02/04/22 with complaints of sudden onset SOB, increased with ambulation.  Sats found to be 78% by EMS.  Improved with [place] and breathing treatment.  Fever 100.1.  Denies chest pain, cough.  Back dressing in place without pain, erythema, or drainage.  Pertinent ED labs: WBC 22.9, HGB 8.1, glucose 273, troponin 80-114, BNP 438, lactic acid 4.1-5.1, COVID/flu negative, UA negative.  BCx x2 and UCx. CXR initially clear.  Patient given IV fluids for sepsis.  Started on cefepime and Vanc. Patient became more hypoxic and SOB.  Patient placed on BiPAP.  CTA chest negative for PE, cardiomegaly, atelectasis, and interstitial edema present.  Patient given 40 mg IV Lasix.  PCCM consulted for ICU admission.  2D Echocardiogram 02/04/22 > LVEF 40-45%, LV mildly dilated with decreased function, grade 2 diastolic dysfunction, RV size and function normal.  Compared  to 2020 EF is slightly less.  Lateral septal LV wall dyssynchrony from underlying LBBB.  Urine culture 2/11 >> 40 K CFU Morganella.   Transferred to [facility], hospitalist service on 02/06/2022. Complaints of severe lower back pain and right hip pain.  Neurosurgery consulted.  Per neurosurgery she has appropriate back pain and some right hip pain that has been there since postop day 1.  No neurosurgical recommendations and okay to discharge from a neurosurgical standpoint.  Seen by cardiology, she will need to follow-up with her cardiologist posthospitalization.    02/07/22: Patient was seen at bedside.  There were no acute events overnight.  She has no new complaints.  Hospital Course:  Principal Problem:   Acute respiratory failure with hypoxia (HCC)  Resolved acute respiratory Distress w/ hypoxia suspect secondary to pulmonary edema from acute systolic and diastolic CHF BNP 970 on presentation, pulmonary edema on chest x-ray 02/04/2022, personally reviewed, resolved on repeat a chest x-ray done on 02/06/2022. Received IV Lasix. 2D echo from 07/16/2019 EF 50-55% Repeat 2D echo 02/04/22 LVEF 40 to 45% significant septal-lateral LV wall dyssynchrony due to the presence of a LBBB, grade 2 diastolic dysfunction. Seen by cardiology, will need to follow-up with a cardiologist within a week. Resume home cardiac medications.   Resolved acute blood loss anemia with recent neurosurgery on 02/01/2022 Hemoglobin transfused on 02/06/2022 for hemoglobin of 7.0. Hemoglobin 8.7 on 02/07/2022. No overt bleeding.   Lower back pain post spinal fusion, decompressive lumbar laminectomy on 02/01/2022 by Dr. Saintclair Halsted, neurosurgery. No acute findings seen  on CT scan done on 02/04/2022. Neurosurgery consulted to evaluate. Follow-up with neurosurgery outpatient. Continue PT OT with assistance and fall precautions.   Suspected HCAP, poa Bibasilar atx vs infiltrates on CT scan, personally reviewed. -Weaned off BiPAP on 2/11, then  to room air. -Received IV cefepime, for possible PNA. Changed to PO cefdinir on 2/12 -Switched to p.o. Levaquin x3 days on 02/07/2022.   Elevated troponin suspect demand ischemia in the setting of severe hypoxia. -High-sensitivity troponin peak 149 and down trended. Not on aspirin due to concern for bleeding with persistent drop in hemoglobin. LDL 36 on 02/07/2022. No anginal symptoms at the time of this visit.   Acute systolic CHF Previous 2D echo done in 2020 showed normal LVEF Presented with elevated BNP greater than 400, pulmonary edema seen on chest x-ray.  Repeated BNP this morning was improved, 214. 2D echo done on 02/04/2022 showed LVEF 40 to 45%. Continue strict I's and O's and daily weight Continue home cardiac regimen Follow-up with your cardiologist.   Sepsis at presentation, Morganella UTI. Source unclear, consider R > LLL PNA.   Consider Morganella UTI although only 40 K CFU, sensitive to fluoroquinolone. Blood cultures negative to date.   S/p PLIF L3-L5 2/8 Continue pain management PT OT recommended outpatient PT.   Hx of HTN/HLD/LBBB/DM2/Gout Resume home regimen.   Code Status: Full code      Consultants: Cardiology Neurosurgery   Procedures: 2D echo   Antimicrobials: Cefdinir, dc'd 02/07/22 Levaquin started 02/07/22 x 3 days   Discharge Exam: BP (!) 151/64 (BP Location: Left Arm)    Pulse 76    Temp 97.9 F (36.6 C) (Oral)    Resp 19    Ht 5' 1.5" (1.562 m)    Wt 99.8 kg    SpO2 96%    BMI 40.90 kg/m  General: 71 y.o. year-old female well developed well nourished in no acute distress.  Alert and oriented x3. Cardiovascular: Regular rate and rhythm with no rubs or gallops.  No thyromegaly or JVD noted.   Respiratory: Clear to auscultation with no wheezes or rales. Good inspiratory effort. Abdomen: Soft nontender nondistended with normal bowel sounds x4 quadrants. Musculoskeletal: No lower extremity edema bilaterally. Psychiatry: Mood is appropriate  for condition and setting  Discharge Instructions You were cared for by a hospitalist during your hospital stay. If you have any questions about your discharge medications or the care you received while you were in the hospital after you are discharged, you can call the unit and asked to speak with the hospitalist on call if the hospitalist that took care of you is not available. Once you are discharged, your primary care physician will handle any further medical issues. Please note that NO REFILLS for any discharge medications will be authorized once you are discharged, as it is imperative that you return to your primary care physician (or establish a relationship with a primary care physician if you do not have one) for your aftercare needs so that they can reassess your need for medications and monitor your lab values.   Allergies as of 02/07/2022       Reactions   Quinapril Hcl    Other reaction(s): cough   Sitagliptin    Other reaction(s): thrush   Tioconazole    Other reaction(s): rash   Lisinopril Other (See Comments)   Other reaction(s): cough   Miconazole Itching, Swelling   Monistat    Penicillins Itching, Rash   Other reaction(s): trouble breathing, rash  Medication List     TAKE these medications    acetaminophen 500 MG tablet Commonly known as: TYLENOL Take 1,000 mg by mouth every 6 (six) hours as needed.   allopurinol 300 MG tablet Commonly known as: ZYLOPRIM Take 300 mg by mouth daily.   amLODipine 10 MG tablet Commonly known as: NORVASC Take 10 mg by mouth daily.   celecoxib 200 MG capsule Commonly known as: CELEBREX Take 200 mg by mouth daily.   cetirizine 10 MG tablet Commonly known as: ZYRTEC Take 10 mg by mouth daily.   colchicine 0.6 MG tablet TAKE 1 TABLET BY MOUTH TWICE A DAY What changed:  when to take this reasons to take this   cyclobenzaprine 10 MG tablet Commonly known as: FLEXERIL Take 1 tablet (10 mg total) by mouth 3  (three) times daily as needed for muscle spasms.   Emergen-C Immune Plus Pack Take 1 packet by mouth daily.   hydrochlorothiazide 12.5 MG capsule Commonly known as: MICROZIDE Take 12.5 mg by mouth daily.   HYDROcodone-acetaminophen 5-325 MG tablet Commonly known as: NORCO/VICODIN Take 1-2 tablets by mouth every 4 (four) hours as needed for severe pain ((score 7 to 10)).   irbesartan 300 MG tablet Commonly known as: AVAPRO TAKE 1 TABLET BY MOUTH DAILY   levofloxacin 750 MG tablet Commonly known as: Levaquin Take 1 tablet (750 mg total) by mouth daily for 3 days.   metFORMIN 1000 MG tablet Commonly known as: GLUCOPHAGE Take 1,000 mg by mouth 2 (two) times daily with a meal.   metoprolol tartrate 25 MG tablet Commonly known as: LOPRESSOR Take 75 mg by mouth 2 (two) times daily.   nystatin cream Commonly known as: MYCOSTATIN Apply 1 application topically daily as needed for rash.   omeprazole 40 MG capsule Commonly known as: PRILOSEC Take 40 mg by mouth daily.   oxyCODONE 5 MG immediate release tablet Commonly known as: Roxicodone Take 1 tablet (5 mg total) by mouth every 6 (six) hours as needed for up to 3 days for severe pain.   Pentosan Polysulfate Sodium 200 MG Cpdr Take 200 mg by mouth 2 (two) times daily.   polyethylene glycol 17 g packet Commonly known as: MIRALAX / GLYCOLAX Take 17 g by mouth daily as needed for moderate constipation.   polyvinyl alcohol 1.4 % ophthalmic solution Commonly known as: LIQUIFILM TEARS Place 1 drop into both eyes as needed for dry eyes.   pravastatin 40 MG tablet Commonly known as: PRAVACHOL Take 40 mg by mouth daily.   trimethoprim 100 MG tablet Commonly known as: TRIMPEX Take 100 mg by mouth daily.   vitamin B-12 1000 MCG tablet Commonly known as: CYANOCOBALAMIN Take 1,000 mcg by mouth daily.       Allergies  Allergen Reactions   Quinapril Hcl     Other reaction(s): cough   Sitagliptin     Other reaction(s):  thrush   Tioconazole     Other reaction(s): rash   Lisinopril Other (See Comments)    Other reaction(s): cough   Miconazole Itching and Swelling    Monistat    Penicillins Itching and Rash    Other reaction(s): trouble breathing, rash    Follow-up Information     Maury Dus, MD Follow up.   Specialty: Family Medicine Contact information: [address] [place] [address] [phone]         [name], MD .   Specialties: Cardiology, Radiology, Interventional Cardiology Contact information: [address]  [place] [address] [phone]         Maury Dus, MD. Call today.   Specialty: Family Medicine Why: Please call for a posthospital follow-up appointment. Contact information: [address] [place] [address] [phone]         [name], MD .   Specialties: Cardiology, Radiology, Interventional Cardiology Contact information: [address] [place] [address] [phone]         Kary Kos, MD. Call today.   Specialty: Neurosurgery Why: Please call for a posthospital follow-up appointment. Contact information: [address] [place] [address] [phone]         [facility] Follow up.   Specialty: Home Health Services Contact information: [place] [address] [phone]                  The results of significant diagnostics from this hospitalization (including imaging, microbiology, ancillary and laboratory) are listed below for reference.    Significant Diagnostic Studies: DG Chest 1 View  Result Date: 02/04/2022 CLINICAL DATA:  Shortness of breath EXAM: CHEST  1 VIEW COMPARISON:  01/11/2010 FINDINGS: Lungs are clear.  No pleural effusion or pneumothorax. The heart is normal in size.  Thoracic aortic atherosclerosis. IMPRESSION: No evidence of acute cardiopulmonary  disease. Electronically Signed   By: Julian Hy M.D.   On: 02/04/2022 01:02   DG Chest 2 View  Result Date: 02/06/2022 CLINICAL DATA:  Hypoxia. EXAM: CHEST - 2 VIEW COMPARISON:  02/04/2022. FINDINGS: Trachea is midline. Heart is enlarged, stable. Thoracic aorta is calcified. Interval improvement in mixed interstitial and airspace opacification. Tiny left pleural effusion. IMPRESSION: Improving congestive heart failure. Electronically Signed   By: Lorin Picket M.D.   On: 02/06/2022 08:08   DG Lumbar Spine 2-3 Views  Result Date: 02/01/2022 CLINICAL DATA:  Lumbar fusion EXAM: LUMBAR SPINE - 2-3 VIEW; DG C-ARM 1-60 MIN-NO REPORT COMPARISON:  11/09/2021 FINDINGS: 2 C-arm fluoroscopic images were obtained intraoperatively and submitted for post operative interpretation. Transpedicular screws are present bilaterally within the L3, L4, and L5 vertebrae with interbody spacers at L3-4 and L4-5. 67 seconds of fluoroscopy time was utilized. Please see the performing provider's procedural report for further detail. IMPRESSION: As above. Electronically Signed   By: Davina Poke D.O.   On: 02/01/2022 12:56   CT Angio Chest PE W and/or Wo Contrast  Result Date: 02/04/2022 CLINICAL DATA:  Postop abdominal pain with shortness of breath. Recent lumbar fusion. EXAM: CT ANGIOGRAPHY CHEST CT ABDOMEN AND PELVIS WITH CONTRAST TECHNIQUE: Multidetector CT imaging of the chest was performed using the standard protocol during bolus administration of intravenous contrast. Multiplanar CT image reconstructions and MIPs were obtained to evaluate the vascular anatomy. Multidetector CT imaging of the abdomen and pelvis was performed using the standard protocol during bolus administration of intravenous contrast. RADIATION DOSE REDUCTION: This exam was performed according to the departmental dose-optimization program which includes automated exposure control, adjustment of the mA and/or kV according to patient size and/or  use of iterative reconstruction technique. CONTRAST:  139mL OMNIPAQUE IOHEXOL 350 MG/ML SOLN COMPARISON:  None. FINDINGS: CTA CHEST FINDINGS Cardiovascular: Satisfactory opacification of the pulmonary arteries to the segmental level. No evidence of pulmonary embolism. Enlarged heart size. No pericardial effusion. Coronary calcification. Non-opacified aorta due to pulmonary artery timing. Atheromatous calcification of the aorta Mediastinum/Nodes: Negative for adenopathy or mass. Lungs/Pleura: Dependent streaky density with volume loss. Bronchomalacia  with bilateral airway flattening. Interlobular septal thickening at the bases. Trace bilateral pleural effusion Musculoskeletal: Generalized spondylosis.  No acute finding Review of the MIP images confirms the above findings. CT ABDOMEN and PELVIS FINDINGS Hepatobiliary: Hepatic steatosis.No evidence of biliary obstruction or stone. Pancreas: Unremarkable. Spleen: Unremarkable. Adrenals/Urinary Tract: Negative adrenals. No hydronephrosis or stone. Unremarkable bladder. Stomach/Bowel:  No obstruction. No appendicitis. Vascular/Lymphatic: No acute vascular abnormality. Atheromatous calcification of the aorta. No mass or adenopathy. Reproductive:No acute finding Other: No ascites or pneumoperitoneum. Musculoskeletal: 2 level PLIF in the lumbar spine. No evidence of hardware displacement or acute fracture. Expected regional swelling and gas at the operative site. Review of the MIP images confirms the above findings. IMPRESSION: Chest CTA: 1. Negative for pulmonary embolism. 2. Cardiomegaly and interstitial edema. 3. Bronchomalacia and basal atelectasis. Abdominal CT: 1. No acute finding.  Recent lumbar PLIF without acute finding. 2. Hepatic steatosis and atherosclerosis. Electronically Signed   By: Jorje Guild M.D.   On: 02/04/2022 05:04   CT ABDOMEN PELVIS W CONTRAST  Result Date: 02/04/2022 CLINICAL DATA:  Postop abdominal pain with shortness of breath. Recent  lumbar fusion. EXAM: CT ANGIOGRAPHY CHEST CT ABDOMEN AND PELVIS WITH CONTRAST TECHNIQUE: Multidetector CT imaging of the chest was performed using the standard protocol during bolus administration of intravenous contrast. Multiplanar CT image reconstructions and MIPs were obtained to evaluate the vascular anatomy. Multidetector CT imaging of the abdomen and pelvis was performed using the standard protocol during bolus administration of intravenous contrast. RADIATION DOSE REDUCTION: This exam was performed according to the departmental dose-optimization program which includes automated exposure control, adjustment of the mA and/or kV according to patient size and/or use of iterative reconstruction technique. CONTRAST:  124mL OMNIPAQUE IOHEXOL 350 MG/ML SOLN COMPARISON:  None. FINDINGS: CTA CHEST FINDINGS Cardiovascular: Satisfactory opacification of the pulmonary arteries to the segmental level. No evidence of pulmonary embolism. Enlarged heart size. No pericardial effusion. Coronary calcification. Non-opacified aorta due to pulmonary artery timing. Atheromatous calcification of the aorta Mediastinum/Nodes: Negative for adenopathy or mass. Lungs/Pleura: Dependent streaky density with volume loss. Bronchomalacia with bilateral airway flattening. Interlobular septal thickening at the bases. Trace bilateral pleural effusion Musculoskeletal: Generalized spondylosis.  No acute finding Review of the MIP images confirms the above findings. CT ABDOMEN and PELVIS FINDINGS Hepatobiliary: Hepatic steatosis.No evidence of biliary obstruction or stone. Pancreas: Unremarkable. Spleen: Unremarkable. Adrenals/Urinary Tract: Negative adrenals. No hydronephrosis or stone. Unremarkable bladder. Stomach/Bowel:  No obstruction. No appendicitis. Vascular/Lymphatic: No acute vascular abnormality. Atheromatous calcification of the aorta. No mass or adenopathy. Reproductive:No acute finding Other: No ascites or pneumoperitoneum.  Musculoskeletal: 2 level PLIF in the lumbar spine. No evidence of hardware displacement or acute fracture. Expected regional swelling and gas at the operative site. Review of the MIP images confirms the above findings. IMPRESSION: Chest CTA: 1. Negative for pulmonary embolism. 2. Cardiomegaly and interstitial edema. 3. Bronchomalacia and basal atelectasis. Abdominal CT: 1. No acute finding.  Recent lumbar PLIF without acute finding. 2. Hepatic steatosis and atherosclerosis. Electronically Signed   By: Jorje Guild M.D.   On: 02/04/2022 05:04   DG Chest Port 1 View  Result Date: 02/04/2022 CLINICAL DATA:  Hypoxia and respiratory failure. EXAM: PORTABLE CHEST 1 VIEW COMPARISON:  Portable chest recently earlier today at 12:48 a.m. FINDINGS: 5:55 a.m., 02/04/2022.  there is interval worsening. Cardiomegaly is again noted, with interval development of perihilar vascular prominence, subpleural septal lines in the bases, and perihilar opacities with a basal gradient in the mid to lower lung fields,  in the lower lung fields extending out laterally. The findings are consistent with CHF or fluid overload with alveolar edema. Underlying pneumonia would be difficult to exclude in the proper clinical setting. Small pleural effusions are beginning to form. There is aortic atherosclerosis, unremarkable mediastinal configuration. Osteopenia and thoracic spondylosis. IMPRESSION: Interval worsening with new central vascular engorgement and perihilar opacities with subpleural edema in the lung bases and small pleural effusions. This is most likely due to CHF or fluid overload with secondary alveolar edema. Underlying pneumonic infiltrate would be difficult to exclude especially in the bases. Clinical correlation and radiographic follow-up recommended. Electronically Signed   By: Telford Nab M.D.   On: 02/04/2022 06:08   DG C-Arm 1-60 Min-No Report  Result Date: 02/01/2022 Fluoroscopy was utilized by the requesting  physician.  No radiographic interpretation.   DG C-Arm 1-60 Min-No Report  Result Date: 02/01/2022 Fluoroscopy was utilized by the requesting physician.  No radiographic interpretation.   DG C-Arm 1-60 Min-No Report  Result Date: 02/01/2022 Fluoroscopy was utilized by the requesting physician.  No radiographic interpretation.   ECHOCARDIOGRAM COMPLETE  Result Date: 02/04/2022    ECHOCARDIOGRAM REPORT   Patient Name:   ABIGIAL [place]VILLE Date of Exam: 02/04/2022 Medical Rec #:  [id number]     Height:       61.5 in Accession #:    [id number]    Weight:       220.0 lb Date of Birth:  22-Dec-1951     BSA:          1.979 m Patient Age:    71 years      BP:           110/45 mmHg Patient Gender: F             HR:           75 bpm. Exam Location:  Inpatient Procedure: 2D Echo Indications:    CHF  History:        Patient has prior history of Echocardiogram examinations, most                 recent 07/16/2019. Arrythmias:LBBB; Risk Factors:Diabetes and                 Hypertension.  Sonographer:    Arlyss Gandy Referring Phys: [id number] [place]  1. Left ventricular ejection fraction, by estimation, is 40 to 45%. The left ventricle has mildly decreased function. There is significant septal-lateral LV wall dyssynchrony due to the presence of a LBBB. The left ventricular internal cavity size was mildly dilated. Left ventricular diastolic parameters are consistent with Grade II diastolic dysfunction (pseudonormalization). Elevated left atrial pressure.  2. Right ventricular systolic function is normal. The right ventricular size is normal. There is mildly elevated pulmonary artery systolic pressure. The estimated right ventricular systolic pressure is 03.5 mmHg.  3. The mitral valve is grossly normal. Mild-to-moderate vs moderate mitral valve regurgitation.  4. The aortic valve is tricuspid. There is mild calcification of the aortic valve. There is mild thickening of the aortic valve. Aortic valve regurgitation  is trivial. Aortic valve sclerosis/calcification is present, without any evidence of aortic stenosis.  5. The inferior vena cava is dilated in size with <50% respiratory variability, suggesting right atrial pressure of 15 mmHg. Comparison(s): Compared to prior TTE in 2020, there EF is slightly lower 40-45% (previously 50-55%) in the setting of more significant septal-lateral LV wall dysynchrony from underlying LBBB. There is now mild-to-moderate vs  moderate MR. FINDINGS  Left Ventricle: Left ventricular ejection fraction, by estimation, is 40 to 45%. The left ventricle has mildly decreased function. There is significant septal-lateral wall dyssynchrony in the setting of LBBB. The left ventricular internal cavity size was mildly dilated. There is no left ventricular hypertrophy. Abnormal (paradoxical) septal motion, consistent with left bundle branch block. Left ventricular diastolic parameters are consistent with Grade II diastolic dysfunction (pseudonormalization). Elevated left atrial pressure. Right Ventricle: The right ventricular size is normal. No increase in right ventricular wall thickness. Right ventricular systolic function is normal. There is mildly elevated pulmonary artery systolic pressure. The tricuspid regurgitant velocity is 2.52  m/s, and with an assumed right atrial pressure of 15 mmHg, the estimated right ventricular systolic pressure is 08.6 mmHg. Left Atrium: Left atrial size was normal in size. Right Atrium: Right atrial size was normal in size. Pericardium: There is no evidence of pericardial effusion. Thickening/calcification of pericardium present. Mitral Valve: The mitral valve is grossly normal. There is mild thickening of the mitral valve leaflet(s). There is mild calcification of the mitral valve leaflet(s). Mild mitral annular calcification. Mild-to-moderate vs moderate mitral valve regurgitation. Tricuspid Valve: The tricuspid valve is normal in structure. Tricuspid valve  regurgitation is trivial. Aortic Valve: The aortic valve is tricuspid. There is mild calcification of the aortic valve. There is mild thickening of the aortic valve. Aortic valve regurgitation is trivial. Aortic valve sclerosis/calcification is present, without any evidence of aortic stenosis. Aortic valve mean gradient measures 7.0 mmHg. Aortic valve peak gradient measures 13.0 mmHg. Aortic valve area, by VTI measures 2.34 cm. Pulmonic Valve: The pulmonic valve was not well visualized. Pulmonic valve regurgitation is trivial. Aorta: The aortic root and ascending aorta are structurally normal, with no evidence of dilitation. Venous: The inferior vena cava is dilated in size with less than 50% respiratory variability, suggesting right atrial pressure of 15 mmHg. IAS/Shunts: The atrial septum is grossly normal.  LEFT VENTRICLE PLAX 2D LVIDd:         5.70 cm   Diastology LVIDs:         4.40 cm   LV e' medial:    7.94 cm/s LV PW:         0.90 cm   LV E/e' medial:  18.5 LV IVS:        0.60 cm   LV e' lateral:   14.00 cm/s LVOT diam:     2.00 cm   LV E/e' lateral: 10.5 LV SV:         83 LV SV Index:   42 LVOT Area:     3.14 cm  RIGHT VENTRICLE             IVC RV Basal diam:  3.40 cm     IVC diam: 2.30 cm RV Mid diam:    2.70 cm RV S prime:     11.20 cm/s TAPSE (M-mode): 2.4 cm LEFT ATRIUM             Index        RIGHT ATRIUM           Index LA diam:        3.20 cm 1.62 cm/m   RA Area:     15.00 cm LA Vol (A2C):   39.0 ml 19.71 ml/m  RA Volume:   31.70 ml  16.02 ml/m LA Vol (A4C):   36.3 ml 18.34 ml/m LA Biplane Vol: 38.4 ml 19.41 ml/m  AORTIC VALVE AV Area (Vmax):  2.41 cm AV Area (Vmean):   2.28 cm AV Area (VTI):     2.34 cm AV Vmax:           180.00 cm/s AV Vmean:          124.000 cm/s AV VTI:            0.357 m AV Peak Grad:      13.0 mmHg AV Mean Grad:      7.0 mmHg LVOT Vmax:         138.00 cm/s LVOT Vmean:        89.850 cm/s LVOT VTI:          0.266 m LVOT/AV VTI ratio: 0.74  AORTA Ao Root diam: 2.90  cm Ao Asc diam:  3.10 cm MITRAL VALVE                TRICUSPID VALVE MV Area (PHT): 3.60 cm     TR Peak grad:   25.4 mmHg MV Decel Time: 211 msec     TR Vmax:        252.00 cm/s MV E velocity: 147.00 cm/s MV A velocity: 111.00 cm/s  SHUNTS MV E/A ratio:  1.32         Systemic VTI:  0.27 m                             Systemic Diam: 2.00 cm Gwyndolyn Kaufman MD Electronically signed by Gwyndolyn Kaufman MD Signature Date/Time: 02/04/2022/5:12:49 PM    Final     Microbiology: Recent Results (from the past 240 hour(s))  SARS CORONAVIRUS 2 (TAT 6-24 HRS) Nasopharyngeal Nasopharyngeal Swab     Status: None   Collection Time: 01/30/22  1:42 PM   Specimen: Nasopharyngeal Swab  Result Value Ref Range Status   SARS Coronavirus 2 NEGATIVE NEGATIVE Final    Comment: (NOTE) SARS-CoV-2 target nucleic acids are NOT DETECTED.  The SARS-CoV-2 RNA is generally detectable in upper and lower respiratory specimens during the acute phase of infection. Negative results do not preclude SARS-CoV-2 infection, do not rule out co-infections with other pathogens, and should not be used as the sole basis for treatment or other patient management decisions. Negative results must be combined with clinical observations, patient history, and epidemiological information. The expected result is Negative.  Fact Sheet for Patients: SugarRoll.be  Fact Sheet for Healthcare Providers: https://www.woods-mathews.com/  This test is not yet approved or cleared by the [place] FDA and  has been authorized for detection and/or diagnosis of SARS-CoV-2 by FDA under an Emergency Use Authorization (EUA). This EUA will remain  in effect (meaning this test can be used) for the duration of the COVID-19 declaration under Se ction 564(b)(1) of the Act, 21 U.S.C. section 360bbb-3(b)(1), unless the authorization is terminated or revoked sooner.  Performed at [facility], [place] [address], [place], [place] [address]   Surgical PCR Screen     Status: None   Collection Time: 01/30/22  1:49 PM   Specimen: Nasal Mucosa; Nasal Swab  Result Value Ref Range Status   MRSA, PCR NEGATIVE NEGATIVE Final   Staphylococcus aureus NEGATIVE NEGATIVE Final    Comment: (NOTE) The Xpert SA Assay (FDA approved for NASAL specimens in patients 84 years of age and older), is one component of a comprehensive surveillance program. It is not intended to diagnose infection nor to guide or monitor treatment. Performed at [facility], [place]  [address], [place], [place] [address]   Resp Panel by RT-PCR (Flu A&B, Covid) Nasopharyngeal Swab     Status: None   Collection Time: 02/04/22 12:22 AM   Specimen: Nasopharyngeal Swab; Nasopharyngeal(NP) swabs in vial transport medium  Result Value Ref Range Status   SARS Coronavirus 2 by RT PCR NEGATIVE NEGATIVE Final    Comment: (NOTE) SARS-CoV-2 target nucleic acids are NOT DETECTED.  The SARS-CoV-2 RNA is generally detectable in upper respiratory specimens during the acute phase of infection. The lowest concentration of SARS-CoV-2 viral copies this assay can detect is 138 copies/mL. A negative result does not preclude SARS-Cov-2 infection and should not be used as the sole basis for treatment or other patient management decisions. A negative result may occur with  improper specimen collection/handling, submission of specimen other than nasopharyngeal swab, presence of viral mutation(s) within the areas targeted by this assay, and inadequate number of viral copies(<138 copies/mL). A negative result must be combined with clinical observations, patient history, and epidemiological information. The expected result is Negative.  Fact Sheet for Patients:  EntrepreneurPulse.com.au  Fact Sheet for Healthcare Providers:  IncredibleEmployment.be  This test is no t yet approved or cleared by the [place] FDA and   has been authorized for detection and/or diagnosis of SARS-CoV-2 by FDA under an Emergency Use Authorization (EUA). This EUA will remain  in effect (meaning this test can be used) for the duration of the COVID-19 declaration under Section 564(b)(1) of the Act, 21 U.S.C.section 360bbb-3(b)(1), unless the authorization is terminated  or revoked sooner.       Influenza A by PCR NEGATIVE NEGATIVE Final   Influenza B by PCR NEGATIVE NEGATIVE Final    Comment: (NOTE) The Xpert Xpress SARS-CoV-2/FLU/RSV plus assay is intended as an aid in the diagnosis of influenza from Nasopharyngeal swab specimens and should not be used as a sole basis for treatment. Nasal washings and aspirates are unacceptable for Xpert Xpress SARS-CoV-2/FLU/RSV testing.  Fact Sheet for Patients: EntrepreneurPulse.com.au  Fact Sheet for Healthcare Providers: IncredibleEmployment.be  This test is not yet approved or cleared by the [place] FDA and has been authorized for detection and/or diagnosis of SARS-CoV-2 by FDA under an Emergency Use Authorization (EUA). This EUA will remain in effect (meaning this test can be used) for the duration of the COVID-19 declaration under Section 564(b)(1) of the Act, 21 U.S.C. section 360bbb-3(b)(1), unless the authorization is terminated or revoked.  Performed at [facility], [place] [address] [place], [place] [address]   Blood culture (routine x 2)     Status: None (Preliminary result)   Collection Time: 02/04/22  2:30 AM   Specimen: BLOOD  Result Value Ref Range Status   Specimen Description BLOOD SITE NOT SPECIFIED  Final   Special Requests   Final    BOTTLES DRAWN AEROBIC AND ANAEROBIC Blood Culture adequate volume   Culture   Final    NO GROWTH 3 DAYS Performed at [facility], [address] [place],  [address]    Report Status PENDING  Incomplete  Blood culture (routine x 2)     Status: None (Preliminary  result)   Collection Time: 02/04/22  2:53 AM   Specimen: BLOOD  Result Value Ref Range Status   Specimen Description BLOOD SITE NOT SPECIFIED  Final   Special Requests   Final    BOTTLES DRAWN AEROBIC AND ANAEROBIC Blood Culture adequate volume   Culture   Final    NO GROWTH 3  DAYS Performed at [facility], [place] [address], [place], [place] [address]    Report Status PENDING  Incomplete  Urine Culture     Status: Abnormal   Collection Time: 02/04/22  3:04 AM   Specimen: Urine, Clean Catch  Result Value Ref Range Status   Specimen Description URINE, CLEAN CATCH  Final   Special Requests   Final    NONE Performed at [facility], [place] [address] [place], [place] [address]    Culture 40,000 COLONIES/mL [facility] MORGANII (A)  Final   Report Status 02/06/2022 FINAL  Final   Organism ID, Bacteria MORGANELLA MORGANII (A)  Final      Susceptibility   Morganella morganii - MIC*    AMPICILLIN >=32 RESISTANT Resistant     CEFAZOLIN >=64 RESISTANT Resistant     CIPROFLOXACIN <=0.25 SENSITIVE Sensitive     GENTAMICIN <=1 SENSITIVE Sensitive     IMIPENEM 2 SENSITIVE Sensitive     NITROFURANTOIN RESISTANT Resistant     TRIMETH/SULFA >=320 RESISTANT Resistant     AMPICILLIN/SULBACTAM >=32 RESISTANT Resistant     PIP/TAZO <=4 SENSITIVE Sensitive     * 40,000 COLONIES/mL MORGANELLA MORGANII  MRSA Next Gen by PCR, Nasal     Status: None   Collection Time: 02/04/22  9:24 AM   Specimen: Nasal Mucosa; Nasal Swab  Result Value Ref Range Status   MRSA by PCR Next Gen NOT DETECTED NOT DETECTED Final    Comment: (NOTE) The GeneXpert MRSA Assay (FDA approved for NASAL specimens only), is one component of a comprehensive MRSA colonization surveillance program. It is not intended to diagnose MRSA infection nor to guide or monitor treatment for MRSA infections. Test performance is not FDA approved in patients less than 8 years old. Performed at [facility], [place] [address], [place], [place] [address]      Labs: Basic Metabolic Panel: Recent Labs  Lab 02/04/22 0130 02/04/22 0551 02/04/22 0727 02/05/22 0154 02/06/22 0302  NA 136 135  --  134* 136  K 4.7 4.6  --  4.3 4.0  CL 102  --   --  103 104  CO2 18*  --   --  22 22  GLUCOSE 273*  --   --  200* 148*  BUN 10  --   --  19 17  CREATININE 0.77  --   --  0.79 0.68  CALCIUM 8.3*  --   --  8.1* 8.3*  MG  --   --  1.4*  --  1.9   Liver Function Tests: Recent Labs  Lab 02/04/22 0130 02/05/22 0154  AST 27 37  ALT 29 29  ALKPHOS 42 50  BILITOT 0.9 0.5  PROT 6.1* 5.5*  ALBUMIN 3.2* 2.7*   No results for input(s): LIPASE, AMYLASE in the last 168 hours. No results for input(s): AMMONIA in the last 168 hours. CBC: Recent Labs  Lab 02/04/22 0130 02/04/22 0551 02/05/22 0154 02/06/22 0302 02/07/22 0253  WBC 22.9*  --  22.9* 12.6* 11.6*  NEUTROABS 19.5*  --   --   --   --   HGB 8.1* 9.2* 7.3* 7.0* 8.7*  HCT 25.8* 27.0* 22.5* 21.9* 26.9*  MCV 89.9  --  88.9 89.4 89.1  PLT 211  --  251 231 287   Cardiac Enzymes: No results for input(s): CKTOTAL, CKMB, CKMBINDEX, TROPONINI in the last 168 hours. BNP: BNP (last 3 results) Recent Labs    02/04/22 0130 02/06/22  0302  BNP 438.0* 214.9*    ProBNP (last 3 results) No results for input(s): PROBNP in the last 8760 hours.  CBG: Recent Labs  Lab 02/06/22 0821 02/06/22 1204 02/06/22 1727 02/06/22 2047 02/07/22 0749  GLUCAP 153* 160* 156* 177* 199*       Signed:  Kayleen Memos, MD [facility] 02/07/2022, 10:00 AM

## 2022-02-07 NOTE — Plan of Care (Signed)

## 2022-02-07 NOTE — Progress Notes (Addendum)
Progress Note  Patient Name: Diane Mayo Date of Encounter: 02/07/2022  Primary Cardiologist: Larae Grooms, MD   Subjective   Patient was seen and examined at her bedside.  Primary team planning for discharge today. She is in a room with her husband.  She had a few questions as able to answer   Inpatient Medications    Scheduled Meds:  sodium chloride   Intravenous Once   allopurinol  300 mg Oral Daily   amLODipine  10 mg Oral Daily   celecoxib  200 mg Oral Daily   Chlorhexidine Gluconate Cloth  6 each Topical Q0600   hydrochlorothiazide  12.5 mg Oral Daily   insulin aspart  0-15 Units Subcutaneous TID WC   insulin aspart  0-5 Units Subcutaneous QHS   irbesartan  300 mg Oral Daily   levofloxacin  750 mg Oral Daily   mouth rinse  15 mL Mouth Rinse BID   metoprolol succinate  75 mg Oral Daily   pantoprazole  40 mg Oral Daily   pravastatin  40 mg Oral Daily   Continuous Infusions:  PRN Meds: acetaminophen, colchicine, cyclobenzaprine, docusate sodium, HYDROcodone-acetaminophen, polyethylene glycol   Vital Signs    Vitals:   02/06/22 1716 02/06/22 1952 02/07/22 0456 02/07/22 0743  BP: (!) 130/54 (!) 169/66 (!) 143/56 (!) 151/64  Pulse: 67 78 63 76  Resp: 18 19 16 19   Temp: 98.4 F (36.9 C) 98.3 F (36.8 C) 98.5 F (36.9 C) 97.9 F (36.6 C)  TempSrc: Oral Oral Oral Oral  SpO2: 94% 92% 97% 96%  Weight:      Height:        Intake/Output Summary (Last 24 hours) at 02/07/2022 1102 Last data filed at 02/07/2022 5397 Gross per 24 hour  Intake 700 ml  Output --  Net 700 ml   Filed Weights   02/04/22 0900  Weight: 99.8 kg    Telemetry    Sinus rhythm- Personally Reviewed  ECG     none- Personally Reviewed  Physical Exam     General: Comfortable, lying in bed Head: Atraumatic, normal size  Eyes: PEERLA, EOMI  Neck: Supple, normal JVD Cardiac: Normal S1, S2; RRR; no murmurs, rubs, or gallops Lungs: Clear to auscultation bilaterally Abd:  Soft, nontender, no hepatomegaly  Ext: warm, no edema Musculoskeletal: No deformities, BUE and BLE strength normal and equal Skin: Warm and dry, no rashes   Neuro: Alert and oriented to person, place, time, and situation, CNII-XII grossly intact, no focal deficits  Psych: Normal mood and affect   Labs    Chemistry Recent Labs  Lab 02/04/22 0130 02/04/22 0551 02/05/22 0154 02/06/22 0302  NA 136 135 134* 136  K 4.7 4.6 4.3 4.0  CL 102  --  103 104  CO2 18*  --  22 22  GLUCOSE 273*  --  200* 148*  BUN 10  --  19 17  CREATININE 0.77  --  0.79 0.68  CALCIUM 8.3*  --  8.1* 8.3*  PROT 6.1*  --  5.5*  --   ALBUMIN 3.2*  --  2.7*  --   AST 27  --  37  --   ALT 29  --  29  --   ALKPHOS 42  --  50  --   BILITOT 0.9  --  0.5  --   GFRNONAA >60  --  >60 >60  ANIONGAP 16*  --  9 10     Hematology Recent Labs  Lab 02/05/22 0154 02/06/22 0302 02/07/22 0253  WBC 22.9* 12.6* 11.6*  RBC 2.53* 2.45* 3.02*  HGB 7.3* 7.0* 8.7*  HCT 22.5* 21.9* 26.9*  MCV 88.9 89.4 89.1  MCH 28.9 28.6 28.8  MCHC 32.4 32.0 32.3  RDW 16.8* 16.9* 16.4*  PLT 251 231 287    Cardiac EnzymesNo results for input(s): TROPONINI in the last 168 hours. No results for input(s): TROPIPOC in the last 168 hours.   BNP Recent Labs  Lab 02/04/22 0130 02/06/22 0302  BNP 438.0* 214.9*     DDimer No results for input(s): DDIMER in the last 168 hours.   Radiology    DG Chest 2 View  Result Date: 02/06/2022 CLINICAL DATA:  Hypoxia. EXAM: CHEST - 2 VIEW COMPARISON:  02/04/2022. FINDINGS: Trachea is midline. Heart is enlarged, stable. Thoracic aorta is calcified. Interval improvement in mixed interstitial and airspace opacification. Tiny left pleural effusion. IMPRESSION: Improving congestive heart failure. Electronically Signed   By: Lorin Picket M.D.   On: 02/06/2022 08:08    Cardiac Studies   TTE 2/11/20233 IMPRESSIONS   1. Left ventricular ejection fraction, by estimation, is 40 to 45%. The  left  ventricle has mildly decreased function. There is significant  septal-lateral LV wall dyssynchrony due to the presence of a LBBB. The  left ventricular internal cavity size was  mildly dilated. Left ventricular diastolic parameters are consistent with  Grade II diastolic dysfunction (pseudonormalization). Elevated left atrial  pressure.   2. Right ventricular systolic function is normal. The right ventricular  size is normal. There is mildly elevated pulmonary artery systolic  pressure. The estimated right ventricular systolic pressure is 78.6 mmHg.   3. The mitral valve is grossly normal. Mild-to-moderate vs moderate  mitral valve regurgitation.   4. The aortic valve is tricuspid. There is mild calcification of the  aortic valve. There is mild thickening of the aortic valve. Aortic valve  regurgitation is trivial. Aortic valve sclerosis/calcification is present,  without any evidence of aortic  stenosis.   5. The inferior vena cava is dilated in size with <50% respiratory  variability, suggesting right atrial pressure of 15 mmHg.   Comparison(s): Compared to prior TTE in 2020, there EF is slightly lower  40-45% (previously 50-55%) in the setting of more significant  septal-lateral LV wall dysynchrony from underlying LBBB. There is now  mild-to-moderate vs moderate MR.   FINDINGS   Left Ventricle: Left ventricular ejection fraction, by estimation, is 40  to 45%. The left ventricle has mildly decreased function. There is  significant septal-lateral wall dyssynchrony in the setting of LBBB. The  left ventricular internal cavity size  was mildly dilated. There is no left ventricular hypertrophy. Abnormal  (paradoxical) septal motion, consistent with left bundle branch block.  Left ventricular diastolic parameters are consistent with Grade II  diastolic dysfunction (pseudonormalization).  Elevated left atrial pressure.   Right Ventricle: The right ventricular size is normal. No  increase in  right ventricular wall thickness. Right ventricular systolic function is  normal. There is mildly elevated pulmonary artery systolic pressure. The  tricuspid regurgitant velocity is 2.52   m/s, and with an assumed right atrial pressure of 15 mmHg, the estimated  right ventricular systolic pressure is 76.7 mmHg.   Left Atrium: Left atrial size was normal in size.   Right Atrium: Right atrial size was normal in size.   Pericardium: There is no evidence of pericardial effusion.  Thickening/calcification of pericardium present.   Mitral Valve: The mitral valve  is grossly normal. There is mild thickening  of the mitral valve leaflet(s). There is mild calcification of the mitral  valve leaflet(s). Mild mitral annular calcification. Mild-to-moderate vs  moderate mitral valve  regurgitation.   Tricuspid Valve: The tricuspid valve is normal in structure. Tricuspid  valve regurgitation is trivial.   Aortic Valve: The aortic valve is tricuspid. There is mild calcification  of the aortic valve. There is mild thickening of the aortic valve. Aortic  valve regurgitation is trivial. Aortic valve sclerosis/calcification is  present, without any evidence of  aortic stenosis. Aortic valve mean gradient measures 7.0 mmHg. Aortic  valve peak gradient measures 13.0 mmHg. Aortic valve area, by VTI measures  2.34 cm.   Pulmonic Valve: The pulmonic valve was not well visualized. Pulmonic valve  regurgitation is trivial.   Aorta: The aortic root and ascending aorta are structurally normal, with  no evidence of dilitation.   Venous: The inferior vena cava is dilated in size with less than 50%  respiratory variability, suggesting right atrial pressure of 15 mmHg.   IAS/Shunts: The atrial septum is grossly normal.   Patient Profile     71 y.o. female with recent surgery noted to be in acute hypoxic respiratory failure secondary to flash pulmonary edema.  Assessment & Plan    Acute on  chronic systolic heart failure Acute hypoxic respiratory failure Mitral regurgitation  Hypertension Depressed ejection fraction   She has responded well to the Lasix.  Breathing has improved significantly.  She has been having few questions about outpatient follow-up.  She will need to follow-up with our team.   In terms of her depressed ejection fraction continue patient on her beta-blocker. She will need a repeat echocardiogram at some point which can be discussed with her primary cardiologist.  Her biggest issue is pain.   [facility] will sign off.   Medication Recommendations: Metoprolol succinate 75 mg daily, Lasix 20 mg as needed for weight 3 pounds increase in 2 days and 5 pounds in 1 week with potassium supplement.  Continue irbesartan Other recommendations (labs, testing, etc):  may need future echo will defer to outpatient discussion  Follow up as an outpatient: With primary cardiologist  For questions or updates, please contact [place] Please consult [email or web address] for contact info under Cardiology/STEMI.      Signed, Tatumn Corbridge, DO  02/07/2022, 11:02 AM

## 2022-02-07 NOTE — Progress Notes (Signed)
The patient is injury-free, afebrile, alert, and oriented X 4. Vital signs were within the baseline during this shift. She verbalizes her pain is improving. Pt denies chest pain, SOB, nausea, vomiting, dizziness, signs or symptoms of bleeding, infection, or acute changes during this shift. We will continue to monitor and work toward achieving the care plan goals.

## 2022-02-07 NOTE — Evaluation (Signed)
Occupational Therapy Evaluation Patient Details Name: Diane Mayo MRN: [id number] DOB: March 06, 1951 Today's Date: 02/07/2022   History of Present Illness Pt is a 71 year old female who presents 2/11 with complaints of sudden onset SOB, increased with ambulation. Sats found to be 78% by EMS. Improved with [place] and breathing treatment. Fever 100.1. Recent admission on 02/01/22 for L3-5 PLIF. PMH: DM2, HTN, OA, LBBB, obesity.   Clinical Impression   Pt reports requiring assist at baseline for ADLs including LB dressing and toileting since back surgery, however independent prior to that. Currently using RW for mobility. Pt min-mod A for ADLs, min guard for transfers and bed mobility using log rolling technique. Pt very limited by pain this session reporting 7/10 pain in R posterior hip radiating to anterior hip, inducing SOB with mobility. Resolves within ~2 mins. Pt able to ambulate short distance to bathroom with 1 seated rest break. Educated pt/spouse on energy conservation strategies for home, including identifying areas for rest breaks/sleeping on side of bed closer to bathroom. Pt and spouse verbalize understanding. Pt presenting with impairments listed below, will follow acutely. Recommend HHOT at d/c.     Recommendations for follow up therapy are one component of a multi-disciplinary discharge planning process, led by the attending physician.  Recommendations may be updated based on patient status, additional functional criteria and insurance authorization.   Follow Up Recommendations  Home health OT    Assistance Recommended at Discharge Intermittent Supervision/Assistance  Patient can return home with the following A little help with walking and/or transfers;A lot of help with bathing/dressing/bathroom;Assistance with cooking/housework;Help with stairs or ramp for entrance;Assist for transportation    Functional Status Assessment  Patient has had a recent decline in their functional status  and demonstrates the ability to make significant improvements in function in a reasonable and predictable amount of time.  Equipment Recommendations  BSC/3in1    Recommendations for Other Services PT consult     Precautions / Restrictions Precautions Precautions: Back Precaution Booklet Issued: No Precaution Comments: pt able to verbalize 3/3 back precautions without prompting Required Braces or Orthoses: Spinal Brace Spinal Brace: Lumbar corset Restrictions Weight Bearing Restrictions: No      Mobility Bed Mobility Overal bed mobility: Needs Assistance Bed Mobility: Sit to Sidelying, Sidelying to Sit   Sidelying to sit: Min guard     Sit to sidelying: Min guard General bed mobility comments: demonstrates good use of log roll technique    Transfers Overall transfer level: Needs assistance Equipment used: Rolling walker (2 wheels) Transfers: Sit to/from Stand Sit to Stand: Min guard           General transfer comment: cues for hand placement, min guard for safety due to pain      Balance Overall balance assessment: Needs assistance Sitting-balance support: Feet supported, Bilateral upper extremity supported Sitting balance-Leahy Scale: Normal     Standing balance support: Reliant on assistive device for balance Standing balance-Leahy Scale: Fair                             ADL either performed or assessed with clinical judgement   ADL Overall ADL's : Needs assistance/impaired Eating/Feeding: Set up;Sitting   Grooming: Set up;Sitting   Upper Body Bathing: Minimal assistance;Sitting   Lower Body Bathing: Moderate assistance;Sitting/lateral leans   Upper Body Dressing : Minimal assistance;Sitting Upper Body Dressing Details (indicate cue type and reason): donning brace sitting EOB Lower Body Dressing: Sitting/lateral leans;Maximal  assistance   Toilet Transfer: Min guard;Rolling walker (2 wheels);Ambulation;Regular Glass blower/designer  Details (indicate cue type and reason): x1 seated rest break Toileting- Clothing Manipulation and Hygiene: Moderate assistance;Sit to/from stand Toileting - Clothing Manipulation Details (indicate cue type and reason): to pull up undergarments     Functional mobility during ADLs: Rolling walker (2 wheels);Min guard       Vision   Vision Assessment?: No apparent visual deficits     Perception     Praxis      Pertinent Vitals/Pain Pain Assessment Faces Pain Scale: Hurts even more Pain Location: R hip Pain Descriptors / Indicators: Sharp, Shooting, Discomfort, Guarding Pain Intervention(s): Limited activity within patient's tolerance, Monitored during session, Premedicated before session, Repositioned     Hand Dominance Right   Extremity/Trunk Assessment Upper Extremity Assessment Upper Extremity Assessment: Overall WFL for tasks assessed   Lower Extremity Assessment Lower Extremity Assessment: Defer to PT evaluation   Cervical / Trunk Assessment Cervical / Trunk Assessment: Back Surgery   Communication Communication Communication: No difficulties   Cognition Arousal/Alertness: Awake/alert Behavior During Therapy: WFL for tasks assessed/performed Overall Cognitive Status: Within Functional Limits for tasks assessed                                       General Comments  spouse present during session, discussed energy conservation/pain mgmt strategies for home including taking seated rest breaks, identifying areas to sit between common household areas (i.e. bed to bathroom, kitchen, etc) Also provided education on purchasing bedrail for bed mobility, pt expressed interest in [facility]    Exercises     Shoulder Instructions      Home Living Family/patient expects to be discharged to:: Private residence Living Arrangements: Spouse/significant other Available Help at Discharge: Family;Available 24 hours/day Type of Home: House Home Access: Stairs to  enter [employer] of Steps: 3 Entrance Stairs-Rails: Right Home Layout: One level     Bathroom Shower/Tub: Occupational psychologist: Handicapped height     Home Equipment: Wheelchair - Publishing copy (2 wheels);Cane - single point;Shower seat;Hand held shower head;Grab bars - tub/shower;Adaptive equipment Adaptive Equipment: Reacher;Sock aid        Prior Functioning/Environment Prior Level of Function : Needs assist             Mobility Comments: Pt reports mobilizing well with the RW since PLIF, cane prior to PLIF ADLs Comments: spouse helps with wiping, LB dressing, min A for initial shower once home        OT Problem List: Decreased range of motion;Decreased activity tolerance;Pain      OT Treatment/Interventions: Self-care/ADL training;Energy conservation;DME and/or AE instruction;Therapeutic exercise;Balance training;Patient/family education;Therapeutic activities    OT Goals(Current goals can be found in the care plan section) Acute Rehab OT Goals Patient Stated Goal: to go home OT Goal Formulation: With patient Time For Goal Achievement: 02/21/22 Potential to Achieve Goals: Good ADL Goals Pt Will Perform Upper Body Dressing: with supervision;sitting Pt Will Perform Lower Body Dressing: with min assist;sitting/lateral leans;with adaptive equipment;sit to/from stand Pt Will Transfer to Toilet: with supervision;ambulating;regular height toilet Pt Will Perform Tub/Shower Transfer: with min guard assist;rolling walker;shower seat;ambulating  OT Frequency: Min 2X/week    Co-evaluation              AM-PAC OT "6 Clicks" Daily Activity     Outcome Measure Help from another person eating meals?: None Help  from another person taking care of personal grooming?: None Help from another person toileting, which includes using toliet, bedpan, or urinal?: A Lot Help from another person bathing (including washing, rinsing, drying)?: A Lot Help  from another person to put on and taking off regular upper body clothing?: A Little Help from another person to put on and taking off regular lower body clothing?: A Lot 6 Click Score: 17   End of Session Equipment Utilized During Treatment: Gait belt;Rolling walker (2 wheels) Nurse Communication: Mobility status  Activity Tolerance: Patient tolerated treatment well Patient left: in bed;with call bell/phone within reach;with family/visitor present  OT Visit Diagnosis: Pain;Muscle weakness (generalized) (M62.81) Pain - Right/Left: Right Pain - part of body: Hip                Time: 2542-7062 OT Time Calculation (min): 35 min Charges:  OT General Charges $OT Visit: 1 Visit OT Evaluation $OT Eval Moderate Complexity: 1 Mod OT Treatments $Self Care/Home Management : 8-22 mins  Lynnda Child, OTD, OTR/L Acute Rehab [phone] - [place] 02/07/2022, 9:02 AM

## 2022-02-07 NOTE — Plan of Care (Signed)
Pt and family understanding of plan of care and discharge instructions

## 2022-02-07 NOTE — TOC Progression Note (Signed)
Transition of Care [facility]) - Progression Note    Patient Details  Name: Diane Mayo MRN: [id number] Date of Birth: 05-30-1951  Transition of Care [facility]) CM/SW Contact  Rhondalyn Clingan, Edson Snowball, RN Phone Number: 02/07/2022, 9:39 AM  Clinical Narrative:     Tommi Rumps with Jeremy Johann with [place] with [facility] , Malachy Mood with Luis Abed with Avelino Leeds with [facility] unable to accept referral.    Amy with [facility] accepted referral.   Expected Discharge Plan: [place] Barriers to Discharge: Continued Medical Work up  Expected Discharge Plan and Services Expected Discharge Plan: [place]   Discharge Planning Services: CM Consult Post Acute Care Choice: [place] arrangements for the past 2 months: Single Family Home Expected Discharge Date: 02/07/22                 DME Agency: NA       HH Arranged: PT           Social Determinants of Health (SDOH) Interventions    Readmission Risk Interventions No flowsheet data found.

## 2022-02-09 DIAGNOSIS — E782 Mixed hyperlipidemia: Secondary | ICD-10-CM | POA: Diagnosis not present

## 2022-02-09 DIAGNOSIS — I10 Essential (primary) hypertension: Secondary | ICD-10-CM | POA: Diagnosis not present

## 2022-02-09 DIAGNOSIS — E119 Type 2 diabetes mellitus without complications: Secondary | ICD-10-CM | POA: Diagnosis not present

## 2022-02-09 LAB — CULTURE, BLOOD (ROUTINE X 2)
Culture: NO GROWTH
Culture: NO GROWTH
Special Requests: ADEQUATE
Special Requests: ADEQUATE

## 2022-02-13 DIAGNOSIS — J189 Pneumonia, unspecified organism: Secondary | ICD-10-CM | POA: Diagnosis not present

## 2022-02-13 DIAGNOSIS — I5043 Acute on chronic combined systolic (congestive) and diastolic (congestive) heart failure: Secondary | ICD-10-CM | POA: Diagnosis not present

## 2022-02-13 DIAGNOSIS — J810 Acute pulmonary edema: Secondary | ICD-10-CM | POA: Diagnosis not present

## 2022-02-13 DIAGNOSIS — J9601 Acute respiratory failure with hypoxia: Secondary | ICD-10-CM | POA: Diagnosis not present

## 2022-02-13 DIAGNOSIS — Z792 Long term (current) use of antibiotics: Secondary | ICD-10-CM | POA: Diagnosis not present

## 2022-02-13 DIAGNOSIS — E669 Obesity, unspecified: Secondary | ICD-10-CM | POA: Diagnosis not present

## 2022-02-13 DIAGNOSIS — A419 Sepsis, unspecified organism: Secondary | ICD-10-CM | POA: Diagnosis not present

## 2022-02-13 DIAGNOSIS — Z7984 Long term (current) use of oral hypoglycemic drugs: Secondary | ICD-10-CM | POA: Diagnosis not present

## 2022-02-13 DIAGNOSIS — E119 Type 2 diabetes mellitus without complications: Secondary | ICD-10-CM | POA: Diagnosis not present

## 2022-02-13 DIAGNOSIS — I10 Essential (primary) hypertension: Secondary | ICD-10-CM | POA: Diagnosis not present

## 2022-02-13 DIAGNOSIS — Z48811 Encounter for surgical aftercare following surgery on the nervous system: Secondary | ICD-10-CM | POA: Diagnosis not present

## 2022-02-23 DIAGNOSIS — J9601 Acute respiratory failure with hypoxia: Secondary | ICD-10-CM | POA: Diagnosis not present

## 2022-02-23 DIAGNOSIS — I10 Essential (primary) hypertension: Secondary | ICD-10-CM | POA: Diagnosis not present

## 2022-02-23 DIAGNOSIS — Z792 Long term (current) use of antibiotics: Secondary | ICD-10-CM | POA: Diagnosis not present

## 2022-02-23 DIAGNOSIS — J189 Pneumonia, unspecified organism: Secondary | ICD-10-CM | POA: Diagnosis not present

## 2022-02-23 DIAGNOSIS — J810 Acute pulmonary edema: Secondary | ICD-10-CM | POA: Diagnosis not present

## 2022-02-23 DIAGNOSIS — Z48811 Encounter for surgical aftercare following surgery on the nervous system: Secondary | ICD-10-CM | POA: Diagnosis not present

## 2022-02-23 DIAGNOSIS — A419 Sepsis, unspecified organism: Secondary | ICD-10-CM | POA: Diagnosis not present

## 2022-02-23 DIAGNOSIS — E119 Type 2 diabetes mellitus without complications: Secondary | ICD-10-CM | POA: Diagnosis not present

## 2022-02-23 DIAGNOSIS — Z7984 Long term (current) use of oral hypoglycemic drugs: Secondary | ICD-10-CM | POA: Diagnosis not present

## 2022-02-23 DIAGNOSIS — I5043 Acute on chronic combined systolic (congestive) and diastolic (congestive) heart failure: Secondary | ICD-10-CM | POA: Diagnosis not present

## 2022-02-27 DIAGNOSIS — N3010 Interstitial cystitis (chronic) without hematuria: Secondary | ICD-10-CM | POA: Diagnosis not present

## 2022-02-27 DIAGNOSIS — R399 Unspecified symptoms and signs involving the genitourinary system: Secondary | ICD-10-CM | POA: Diagnosis not present

## 2022-03-01 DIAGNOSIS — Z0000 Encounter for general adult medical examination without abnormal findings: Secondary | ICD-10-CM | POA: Diagnosis not present

## 2022-03-01 DIAGNOSIS — I10 Essential (primary) hypertension: Secondary | ICD-10-CM | POA: Diagnosis not present

## 2022-03-01 DIAGNOSIS — N3010 Interstitial cystitis (chronic) without hematuria: Secondary | ICD-10-CM | POA: Diagnosis not present

## 2022-03-01 DIAGNOSIS — E782 Mixed hyperlipidemia: Secondary | ICD-10-CM | POA: Diagnosis not present

## 2022-03-01 DIAGNOSIS — K219 Gastro-esophageal reflux disease without esophagitis: Secondary | ICD-10-CM | POA: Diagnosis not present

## 2022-03-01 DIAGNOSIS — E1169 Type 2 diabetes mellitus with other specified complication: Secondary | ICD-10-CM | POA: Diagnosis not present

## 2022-03-01 DIAGNOSIS — I5021 Acute systolic (congestive) heart failure: Secondary | ICD-10-CM | POA: Diagnosis not present

## 2022-03-01 DIAGNOSIS — D72829 Elevated white blood cell count, unspecified: Secondary | ICD-10-CM | POA: Diagnosis not present

## 2022-03-01 DIAGNOSIS — I700 Atherosclerosis of aorta: Secondary | ICD-10-CM | POA: Diagnosis not present

## 2022-03-01 DIAGNOSIS — M1000 Idiopathic gout, unspecified site: Secondary | ICD-10-CM | POA: Diagnosis not present

## 2022-03-09 DIAGNOSIS — M4316 Spondylolisthesis, lumbar region: Secondary | ICD-10-CM | POA: Diagnosis not present

## 2022-03-23 ENCOUNTER — Ambulatory Visit (HOSPITAL_BASED_OUTPATIENT_CLINIC_OR_DEPARTMENT_OTHER): Payer: PPO | Admitting: Family

## 2022-03-23 VITALS — BP 128/72 | HR 69 | Ht 61.5 in | Wt 212.5 lb

## 2022-03-23 DIAGNOSIS — I447 Left bundle-branch block, unspecified: Secondary | ICD-10-CM

## 2022-03-23 DIAGNOSIS — I25118 Atherosclerotic heart disease of native coronary artery with other forms of angina pectoris: Secondary | ICD-10-CM

## 2022-03-23 DIAGNOSIS — I5022 Chronic systolic (congestive) heart failure: Secondary | ICD-10-CM | POA: Diagnosis not present

## 2022-03-23 DIAGNOSIS — I340 Nonrheumatic mitral (valve) insufficiency: Secondary | ICD-10-CM | POA: Diagnosis not present

## 2022-03-23 DIAGNOSIS — I10 Essential (primary) hypertension: Secondary | ICD-10-CM | POA: Diagnosis not present

## 2022-03-23 NOTE — Progress Notes (Signed)
? ?Office Visit  ?  ?Patient Name: Diane Mayo ?Date of Encounter: 03/24/2022 ? ?PCP:  Maury Dus, MD ?  ?[place]  ?Cardiologist:  Larae Grooms, MD  ?Advanced Practice Provider:  No care team member to display ?Electrophysiologist:  None  ?   ? ?Chief Complaint  ?  ?Diane Mayo is a 71 y.o. female with a hx of hypertension, hyperlipidemia, DM2, morbid obesity, LBBB, CHF, coronary calcification on CT presents today for hospital follow-up ? ?Past Medical History  ?  ?Past Medical History:  ?Diagnosis Date  ? Diabetes mellitus without complication ([place])   ? Type 2  ? Genital herpes   ? GERD (gastroesophageal reflux disease)   ? Hypertension   ? Interstitial cystitis   ? Left bundle branch block (LBBB)   ? chronic  ? Leukocytosis   ? Osteoarthritis   ? Plantar fasciitis   ? ?Past Surgical History:  ?Procedure Laterality Date  ? CARPAL TUNNEL RELEASE Bilateral   ? CESAREAN SECTION    ? twice 1978 and 1987  ? [place] OF UTERUS  1975  ? EYE SURGERY Bilateral 2021  ? cataracts removed  ? SHOULDER SURGERY Right 2012  ? rotator cuff repair  ? TUBAL LIGATION  1987  ? ?Allergies ? ?Allergies  ?Allergen Reactions  ? Quinapril Hcl   ?  Other reaction(s): cough  ? Sitagliptin   ?  Other reaction(s): thrush  ? Tioconazole   ?  Other reaction(s): rash  ? Lisinopril Other (See Comments)  ?  Other reaction(s): cough  ? Miconazole Itching and Swelling  ?  Monistat   ? Penicillins Itching and Rash  ?  Other reaction(s): trouble breathing, rash  ? ? ?History of Present Illness  ?  ?Diane Mayo is a 71 y.o. female with a hx of hypertension, hyperlipidemia, DM2, morbid obesity, LBBB, CHF, coronary artery calcification on CT last seen while hospitalized ? ?Prior echocardiogram July 2020 was low normal LVEF 50 to 55%, pseudonormal diastolic parameters, abnormal septal motion consistent with left bundle branch block.  She has a history of difficulty to control blood pressure.  Renal  ultrasound normal 05/2019.  Metanephrine normal 06/2019.  She was seen by Dr. Emeterio Reeve 01/2022 for preoperative clearance for lumbar laminectomy and provided clearance.  She underwent PLIF L3-L5 02/01/2022 [place] procedure well and discharged next day.  However she was readmitted with findings of cardiomegaly, interstitial edema, pneumonia, flash pulmonary edema.  Echo with reduced LVEF 40 to 45%, mild to moderate MR, wall motion abnormalities consistent with known LBBB.  She was treated with diuretics.  She had CT angio chest on 02/04/2022 with inadvertent finding of coronary calcification. ? ?Presents today for follow up with her husband. Still with some discomfort in her right leg since being home though notes her pain prior to back procedure was left leg.  Continues to follow with neurosurgery.Tells me intermittently she gets a painful sensation in her breast - relieved by massage.  We discussed that this is atypical for angina but did reviewed that her CT scan showed plaque in coronary arteries. Her breathing has been okay since discharge.  No edema, orthopnea, PND. ? ?EKGs/Labs/Other Studies Reviewed:  ? ?The following studies were reviewed today: ? ?Echo 01/2022  ? 1. Left ventricular ejection fraction, by estimation, is 40 to 45%. The  ?left ventricle has mildly decreased function. There is significant  ?septal-lateral LV wall dyssynchrony due to the presence of a LBBB. The  ?  left ventricular internal cavity size was  ?mildly dilated. Left ventricular diastolic parameters are consistent with  ?Grade II diastolic dysfunction (pseudonormalization). Elevated left atrial  ?pressure.  ? 2. Right ventricular systolic function is normal. The right ventricular  ?size is normal. There is mildly elevated pulmonary artery systolic  ?pressure. The estimated right ventricular systolic pressure is 40.8 mmHg.  ? 3. The mitral valve is grossly normal. Mild-to-moderate vs moderate  ?mitral valve regurgitation.  ? 4. The aortic  valve is tricuspid. There is mild calcification of the  ?aortic valve. There is mild thickening of the aortic valve. Aortic valve  ?regurgitation is trivial. Aortic valve sclerosis/calcification is present,  ?without any evidence of aortic  ?stenosis.  ? 5. The inferior vena cava is dilated in size with <50% respiratory  ?variability, suggesting right atrial pressure of 15 mmHg.  ? ?Comparison(s): Compared to prior TTE in 2020, there EF is slightly lower  ?40-45% (previously 50-55%) in the setting of more significant  ?septal-lateral LV wall dysynchrony from underlying LBBB. There is now  ?mild-to-moderate vs moderate MR.  ? ?EKG: No EKG today ? ?Recent Labs: ?02/05/2022: ALT 29 ?02/06/2022: B Natriuretic Peptide 214.9; BUN 17; Creatinine, Ser 0.68; Magnesium 1.9; Potassium 4.0; Sodium 136 ?02/07/2022: Hemoglobin 8.7; Platelets 287  ?Recent Lipid Panel ?   ?Component Value Date/Time  ? CHOL 118 02/07/2022 0253  ? TRIG 273 (H) 02/07/2022 0253  ? HDL 27 (L) 02/07/2022 0253  ? CHOLHDL 4.4 02/07/2022 0253  ? VLDL 55 (H) 02/07/2022 0253  ? [place] 36 02/07/2022 0253  ? ? ?Home Medications  ? ?Current Meds  ?Medication Sig  ? acetaminophen (TYLENOL) 500 MG tablet Take 1,000 mg by mouth every 6 (six) hours as needed.  ? allopurinol (ZYLOPRIM) 300 MG tablet Take 300 mg by mouth daily.  ? amLODipine (NORVASC) 10 MG tablet Take 10 mg by mouth daily.  ? celecoxib (CELEBREX) 200 MG capsule Take 200 mg by mouth daily.  ? cetirizine (ZYRTEC) 10 MG tablet Take 10 mg by mouth daily.  ? colchicine 0.6 MG tablet TAKE 1 TABLET BY MOUTH TWICE A DAY (Patient taking differently: Take 0.6 mg by mouth 2 (two) times daily as needed (gout).)  ? hydrochlorothiazide (MICROZIDE) 12.5 MG capsule Take 12.5 mg by mouth daily.  ? HYDROcodone-acetaminophen (NORCO/VICODIN) 5-325 MG tablet Take 1-2 tablets by mouth every 4 (four) hours as needed for severe pain ((score 7 to 10)).  ? irbesartan (AVAPRO) 300 MG tablet TAKE 1 TABLET BY MOUTH DAILY (Patient  taking differently: Take 300 mg by mouth daily.)  ? metFORMIN (GLUCOPHAGE) 1000 MG tablet Take 1,000 mg by mouth 2 (two) times daily with a meal.  ? metoprolol tartrate (LOPRESSOR) 25 MG tablet Take 75 mg by mouth 2 (two) times daily.  ? Multiple Vitamins-Minerals (EMERGEN-C IMMUNE PLUS) PACK Take 1 packet by mouth daily.  ? nystatin cream (MYCOSTATIN) Apply 1 application topically daily as needed for rash.  ? omeprazole (PRILOSEC) 40 MG capsule Take 40 mg by mouth daily.  ? Pentosan Polysulfate Sodium 200 MG CPDR Take 200 mg by mouth 2 (two) times daily.  ? polyvinyl alcohol (LIQUIFILM TEARS) 1.4 % ophthalmic solution Place 1 drop into both eyes as needed for dry eyes.  ? pravastatin (PRAVACHOL) 40 MG tablet Take 40 mg by mouth daily.  ? trimethoprim (TRIMPEX) 100 MG tablet Take 100 mg by mouth daily.  ? vitamin B-12 (CYANOCOBALAMIN) 1000 MCG tablet Take 1,000 mcg by mouth daily.  ? [DISCONTINUED] polyethylene glycol (MIRALAX /  GLYCOLAX) 17 g packet Take 17 g by mouth daily as needed for moderate constipation.  ?  ? ?Review of Systems  ?    ?All other systems reviewed and are otherwise negative except as noted above. ? ?Physical Exam  ?  ?VS:  BP 128/72 (BP Location: Right Arm, Patient Position: Sitting, Cuff Size: Large)   Pulse 69   Ht 5' 1.5" (1.562 m)   Wt 212 lb 8 oz (96.4 kg)   SpO2 97%   BMI 39.50 kg/m?  , BMI Body mass index is 39.5 kg/m?. ? ?Wt Readings from Last 3 Encounters:  ?03/23/22 212 lb 8 oz (96.4 kg)  ?02/04/22 220 lb 0.3 oz (99.8 kg)  ?02/01/22 220 lb (99.8 kg)  ?  ? ?GEN: Well nourished, overweight, well developed, in no acute distress. ?HEENT: normal. ?Neck: Supple, no JVD, carotid bruits, or masses. ?Cardiac: RRR, no murmurs, rubs, or gallops. No clubbing, cyanosis, edema.  Radials/PT 2+ and equal bilaterally.  ?Respiratory:  Respirations regular and unlabored, clear to auscultation bilaterally. ?GI: Soft, nontender, nondistended. ?MS: No deformity or atrophy.  Wearing back brace ?Skin:  Warm and dry, no rash. ?Neuro:  Strength and sensation are intact. ?Psych: Normal affect. ? ?Assessment & Plan  ?  ?Heart failure / MR -echo during admission with LVEF 40 to 45% with mild MR in setting of flash p

## 2022-03-23 NOTE — Patient Instructions (Signed)
Medication Instructions:  ?Continue your current medications.  ? ?*If you need a refill on your cardiac medications before your next appointment, please call your pharmacy* ? ?Lab Work: ?None ordered today.  ? ?Testing/Procedures: ?Your physician has requested that you have an echocardiogram in 2-4 weeks. Echocardiography is a painless test that uses sound waves to create images of your heart. It provides your doctor with information about the size and shape of your heart and how well your heart?s chambers and valves are working. This procedure takes approximately one hour. There are no restrictions for this procedure.  ? ? ?Follow-Up: ?At [facility], you and your health needs are our priority.  As part of our continuing mission to provide you with exceptional heart care, we have created designated Provider Care Teams.  These Care Teams include your primary Cardiologist (physician) and Advanced Practice Providers (APPs -  Physician Assistants and Nurse Practitioners) who all work together to provide you with the care you need, when you need it. ? ?We recommend signing up for the patient portal called "MyChart".  Sign up information is provided on this After Visit Summary.  MyChart is used to connect with patients for Virtual Visits (Telemedicine).  Patients are able to view lab/test results, encounter notes, upcoming appointments, etc.  Non-urgent messages can be sent to your provider as well.   ?To learn more about what you can do with MyChart, go to [email or web address].   ? ?Your next appointment:   ?In July as scheduled with Dr. Irish Lack ? ?Other Instructions ? ?Heart Healthy Diet Recommendations: ?A low-salt diet is recommended. Meats should be grilled, baked, or boiled. Avoid fried foods. Focus on lean protein sources like fish or chicken with vegetables and fruits. The American Heart Association is a Microbiologist!  American Heart Association Diet and Lifeystyle Recommendations   ?Recommend drinking  less than 2 liters of fluid (64 oz) per day.  ? ?Exercise recommendations: ?The American Heart Association recommends 150 minutes of moderate intensity exercise weekly. ?Try 30 minutes of moderate intensity exercise 4-5 times per week. ?This could include walking, jogging, or swimming. ?  ?

## 2022-03-24 ENCOUNTER — Encounter (HOSPITAL_BASED_OUTPATIENT_CLINIC_OR_DEPARTMENT_OTHER): Payer: Self-pay | Admitting: Family

## 2022-03-24 DIAGNOSIS — D72829 Elevated white blood cell count, unspecified: Secondary | ICD-10-CM | POA: Diagnosis not present

## 2022-03-24 DIAGNOSIS — E871 Hypo-osmolality and hyponatremia: Secondary | ICD-10-CM | POA: Diagnosis not present

## 2022-03-29 DIAGNOSIS — I10 Essential (primary) hypertension: Secondary | ICD-10-CM | POA: Diagnosis not present

## 2022-03-29 DIAGNOSIS — I5021 Acute systolic (congestive) heart failure: Secondary | ICD-10-CM | POA: Diagnosis not present

## 2022-03-29 DIAGNOSIS — E782 Mixed hyperlipidemia: Secondary | ICD-10-CM | POA: Diagnosis not present

## 2022-03-29 DIAGNOSIS — E119 Type 2 diabetes mellitus without complications: Secondary | ICD-10-CM | POA: Diagnosis not present

## 2022-04-06 ENCOUNTER — Ambulatory Visit (INDEPENDENT_AMBULATORY_CARE_PROVIDER_SITE_OTHER): Payer: PPO

## 2022-04-06 DIAGNOSIS — I5022 Chronic systolic (congestive) heart failure: Secondary | ICD-10-CM

## 2022-04-06 LAB — ECHOCARDIOGRAM COMPLETE
AR max vel: 2.48 cm2
AV Area VTI: 2.62 cm2
AV Area mean vel: 2.49 cm2
AV Mean grad: 5 mmHg
AV Peak grad: 9.6 mmHg
AV Vena cont: 0.22 cm
Ao pk vel: 1.55 m/s
Area-P 1/2: 3.45 cm2
Calc EF: 48.3 %
S' Lateral: 3.94 cm
Single Plane A2C EF: 51.9 %
Single Plane A4C EF: 46.5 %

## 2022-04-07 ENCOUNTER — Telehealth (HOSPITAL_BASED_OUTPATIENT_CLINIC_OR_DEPARTMENT_OTHER): Payer: Self-pay

## 2022-04-07 DIAGNOSIS — I5022 Chronic systolic (congestive) heart failure: Secondary | ICD-10-CM

## 2022-04-07 MED ORDER — METOPROLOL TARTRATE 25 MG PO TABS: ORAL_TABLET | ORAL | 0 refills | Status: DC

## 2022-04-07 NOTE — Telephone Encounter (Signed)
Please advise 

## 2022-04-07 NOTE — Telephone Encounter (Addendum)
? ?  Your cardiac CT will be scheduled at one of the below locations:  ? ?[facility] ?[address] ?[place], [place] [address] ?[phone] ? ? ?If scheduled at [facility], please arrive at the [facility] Entrance (Entrance C2) of [facility] 30 minutes prior to test start time. ?You can use the FREE valet parking offered at entrance C (encouraged to control the heart rate for the test)  ?Proceed to the [facility] Radiology Department (first floor) to check-in and test prep. ? ?All radiology patients and guests should use entrance C2 at [facility], accessed from [facility], even though the hospital's physical address listed is [address]. ? ? ? ? ?Please follow these instructions carefully (unless otherwise directed): ? ? ?On the Night Before the Test: ?Be sure to Drink plenty of water. ?Do not consume any caffeinated/decaffeinated beverages or chocolate 12 hours prior to your test. ?Do not take any antihistamines 12 hours prior to your test. ? ?On the Day of the Test: ?Drink plenty of water until 1 hour prior to the test. ?Do not eat any food 4 hours prior to the test. ?You may take your regular medications prior to the test.  ?Take metoprolol (Lopressor) two hours prior to test. ?FEMALES- please wear underwire-free bra if available, avoid dresses & tight clothing ? ?     ?After the Test: ?Drink plenty of water. ?After receiving IV contrast, you may experience a mild flushed feeling. This is normal. ?On occasion, you may experience a mild rash up to 24 hours after the test. This is not dangerous. If this occurs, you can take Benadryl 25 mg and increase your fluid intake. ?If you experience trouble breathing, this can be serious. If it is severe call 911 IMMEDIATELY. If it is mild, please call our office. ?If you take any of these medications: Glipizide/Metformin, Avandament, Glucavance, please do not take 48 hours after completing test  unless otherwise instructed. ? ?We will call to schedule your test 2-4 weeks out understanding that some insurance companies will need an authorization prior to the service being performed.  ? ?For non-scheduling related questions, please contact the cardiac imaging nurse navigator should you have any questions/concerns: ?Marchia Bond, Cardiac Imaging Nurse Navigator ?Gordy Clement, Cardiac Imaging Nurse Navigator ?[facility] ?Direct Office Dial: [phone]  ? ?For scheduling needs, including cancellations and rescheduling, please call [place], [phone]. ? ? ? ?----- Message from [name], NP sent at 04/07/2022  9:20 AM EDT ----- ?Echocardiogram reveals mildly reduced LVEF 45-50%.  Stable compared to previous.  Mild thickening of the heart motion.  Heart moderately stiff.  Mitral regurgitation much improved. ? ?Given LVEF still mildly reduced, recommend cardiac CTA. This is a CT scan of the heart that lets [place] look for any plaque or blockage in the heart arteries.  ?

## 2022-04-07 NOTE — Telephone Encounter (Addendum)
Left message for patient to call back ? ? ? ? ?----- Message from [name], NP sent at 04/07/2022  9:20 AM EDT ----- ?Echocardiogram reveals mildly reduced LVEF 45-50%.  Stable compared to previous.  Mild thickening of the heart motion.  Heart moderately stiff.  Mitral regurgitation much improved. ? ?Given LVEF still mildly reduced, recommend cardiac CTA. This is a CT scan of the heart that lets [place] look for any plaque or blockage in the heart arteries.  ?

## 2022-04-07 NOTE — Telephone Encounter (Signed)
Still take extra '25mg'$  that morning for total dose of '100mg'$  that morning. This helps [place] to get the best pictures for the study. Ensure she takes 2 hours prior to cardiac CTA.  ? ?[name], NP  ?

## 2022-04-07 NOTE — Addendum Note (Signed)
Addended by: Gerald Stabs on: 04/07/2022 12:45 PM ? ? Modules accepted: Orders ? ?

## 2022-04-10 DIAGNOSIS — D649 Anemia, unspecified: Secondary | ICD-10-CM | POA: Diagnosis not present

## 2022-04-17 DIAGNOSIS — I5022 Chronic systolic (congestive) heart failure: Secondary | ICD-10-CM | POA: Diagnosis not present

## 2022-04-17 LAB — BASIC METABOLIC PANEL
BUN/Creatinine Ratio: 12 (ref 12–28)
BUN: 10 mg/dL (ref 8–27)
CO2: 20 mmol/L (ref 20–29)
Calcium: 9.8 mg/dL (ref 8.7–10.3)
Chloride: 102 mmol/L (ref 96–106)
Creatinine, Ser: 0.82 mg/dL (ref 0.57–1.00)
Glucose: 211 mg/dL — ABNORMAL HIGH (ref 70–99)
Potassium: 4.4 mmol/L (ref 3.5–5.2)
Sodium: 142 mmol/L (ref 134–144)
eGFR: 77 mL/min/{1.73_m2} (ref >59)

## 2022-04-18 ENCOUNTER — Telehealth: Payer: Self-pay | Admitting: Interventional Cardiology

## 2022-04-18 NOTE — Telephone Encounter (Signed)
Pt states that she forgot what medications she is needs to stop taking prior to her procedure that she's having on 4/27. Pt would like a call back. Please advise ?

## 2022-04-18 NOTE — Telephone Encounter (Signed)
Spoke with the patient and advised her on medications to hold prior to and after CTA.  ?No antihistamines for 12 hours prior.  ?Hold HCTZ morning of.  ?Hold metformin morning of and 48 hours after.  ?Take Lopressor 2 hours prior.  ?Patient verbalized understanding.  ?

## 2022-04-19 ENCOUNTER — Telehealth (HOSPITAL_COMMUNITY): Payer: Self-pay | Admitting: *Deleted

## 2022-04-19 NOTE — Telephone Encounter (Signed)
Reaching out to patient to offer assistance regarding upcoming cardiac imaging study; pt verbalizes understanding of appt date/time, parking situation and where to check in, pre-test NPO status and medications ordered, and verified current allergies; name and call back number provided for further questions should they arise ? ?Gordy Clement RN Navigator Cardiac Imaging ?[facility] ?[phone] office ?[phone] cell ? ?Patient to take '100mg'$  metoprolol tartrate two hours prior to her cardiac CT scan. She is aware arrive at 8am. ?

## 2022-04-20 ENCOUNTER — Ambulatory Visit (HOSPITAL_COMMUNITY)
Admission: RE | Admit: 2022-04-20 | Discharge: 2022-04-20 | Disposition: A | Discharge status: Home / Self Care | Payer: PPO | Source: Ambulatory Visit | Attending: Family | Admitting: Family

## 2022-04-20 ENCOUNTER — Telehealth (HOSPITAL_BASED_OUTPATIENT_CLINIC_OR_DEPARTMENT_OTHER): Payer: Self-pay

## 2022-04-20 ENCOUNTER — Encounter (HOSPITAL_COMMUNITY): Payer: Self-pay

## 2022-04-20 DIAGNOSIS — I5022 Chronic systolic (congestive) heart failure: Secondary | ICD-10-CM | POA: Insufficient documentation

## 2022-04-20 DIAGNOSIS — M4316 Spondylolisthesis, lumbar region: Secondary | ICD-10-CM | POA: Diagnosis not present

## 2022-04-20 MED ORDER — NITROGLYCERIN 0.4 MG SL SUBL
0.8000 mg | SUBLINGUAL_TABLET | Freq: Once | SUBLINGUAL | Status: AC
  Administered 2022-04-20: 0.8 mg via SUBLINGUAL

## 2022-04-20 MED ORDER — NITROGLYCERIN 0.4 MG SL SUBL
SUBLINGUAL_TABLET | SUBLINGUAL | Status: AC
  Filled 2022-04-20: qty 2

## 2022-04-20 MED ORDER — ASPIRIN EC 81 MG PO TBEC: 81.0000 mg | DELAYED_RELEASE_TABLET | Freq: Every day | ORAL | 3 refills | Status: DC

## 2022-04-20 MED ORDER — IOHEXOL 350 MG/ML SOLN
100.0000 mL | Freq: Once | INTRAVENOUS | Status: AC | PRN
  Administered 2022-04-20: 100 mL via INTRAVENOUS

## 2022-04-20 NOTE — Telephone Encounter (Addendum)
Results called to patient who verbalizes understanding!  ? ? ? ? ? ?----- Message from [name], NP sent at 04/20/2022 12:03 PM EDT ----- ?Cardiac CTA with coronary calcium score of 457.  There was mild nonobstructive coronary disease.  For secondary prevention recommend starting aspirin EC 81 mg daily.  Continue pravastatin, metoprolol at current doses. ?

## 2022-05-17 DIAGNOSIS — H43813 Vitreous degeneration, bilateral: Secondary | ICD-10-CM | POA: Diagnosis not present

## 2022-05-17 DIAGNOSIS — Z79899 Other long term (current) drug therapy: Secondary | ICD-10-CM | POA: Diagnosis not present

## 2022-05-17 DIAGNOSIS — E119 Type 2 diabetes mellitus without complications: Secondary | ICD-10-CM | POA: Diagnosis not present

## 2022-05-17 DIAGNOSIS — Z961 Presence of intraocular lens: Secondary | ICD-10-CM | POA: Diagnosis not present

## 2022-06-06 DIAGNOSIS — D225 Melanocytic nevi of trunk: Secondary | ICD-10-CM | POA: Diagnosis not present

## 2022-06-06 DIAGNOSIS — L821 Other seborrheic keratosis: Secondary | ICD-10-CM | POA: Diagnosis not present

## 2022-06-09 ENCOUNTER — Ambulatory Visit (HOSPITAL_BASED_OUTPATIENT_CLINIC_OR_DEPARTMENT_OTHER)
Admission: RE | Admit: 2022-06-09 | Discharge: 2022-06-09 | Disposition: A | Discharge status: Home / Self Care | Payer: PPO | Source: Ambulatory Visit | Attending: Physician Assistant | Admitting: Physician Assistant

## 2022-06-09 ENCOUNTER — Other Ambulatory Visit (HOSPITAL_BASED_OUTPATIENT_CLINIC_OR_DEPARTMENT_OTHER): Payer: Self-pay | Admitting: Physician Assistant

## 2022-06-09 DIAGNOSIS — R10811 Right upper quadrant abdominal tenderness: Secondary | ICD-10-CM | POA: Diagnosis not present

## 2022-07-04 DIAGNOSIS — R1013 Epigastric pain: Secondary | ICD-10-CM | POA: Diagnosis not present

## 2022-07-04 DIAGNOSIS — R112 Nausea with vomiting, unspecified: Secondary | ICD-10-CM | POA: Diagnosis not present

## 2022-07-05 ENCOUNTER — Telehealth: Payer: Self-pay | Admitting: *Deleted

## 2022-07-05 NOTE — Telephone Encounter (Signed)
Patient has appointment with Dr. Irish Lack on 07/12/22 to follow-up on recent cardiac testing. Will route request to him to address at office visit and notes have been updated in appointment screen.  Emmaline Life, NP-C    07/05/2022, 10:18 AM [place] [address], [address] Office [phone] Fax [phone]

## 2022-07-05 NOTE — Telephone Encounter (Signed)
   Pre-operative Risk Assessment    Patient Name: Diane Mayo  DOB: [birth year] MRN: [id number]      Request for Surgical Clearance    Procedure:   ENDOSCOPY  Date of Surgery:  Clearance 08/15/22                                 Surgeon:  DR. PARAG BRAHMBHATT Surgeon's Group or Practice Name:  [facility] Phone number:  [phone] Fax number:  [phone]   Type of Clearance Requested:   - Medical  - Pharmacy:  Hold Aspirin NOT INDICATED   Type of Anesthesia:   PROPOFOL   Additional requests/questions:    Astrid Divine   07/05/2022, 7:26 AM

## 2022-07-10 NOTE — Telephone Encounter (Signed)
Pt has an appointment to see Dr. Irish Lack, 07/12/22, clearance will be addressed at that time.  Will route to the requesting surgeon's office to make them aware.

## 2022-07-11 NOTE — Progress Notes (Unsigned)
Cardiology Office Note   Date:  07/12/2022   ID:  Diane Mayo, DOB December 27, 1950, MRN [id number]  PCP:  Maury Dus, MD    No chief complaint on file.  Chronic systolic heart failure  Wt Readings from Last 3 Encounters:  07/12/22 218 lb (98.9 kg)  03/23/22 212 lb 8 oz (96.4 kg)  02/04/22 220 lb 0.3 oz (99.8 kg)       History of Present Illness: Diane Mayo is a 71 y.o. female  Who has had difficult to control BP.  renal US normal 06/02/19- metanephrine normal 07/07/19    Over the years, Limited exercise at times because of her back and leg pain.    Her father had CAD.  He had stents placed at age 34.     In the past, she did try the valsartan for 3 weeks, but BP did not change.   She had a cough with lisinopril / quinapril in the past.   Initial plan in 06/2019 was: "No benefit from valsartan.  She does eat a fair mount of salt.  Avoid processed foods. Will start irbesartan.  She cannot exercise due to leg problems.  Will try to wean clonidine if irbesartan works."  [employer] watchers was recommended.     She had a back injection for back pain.    BP was well controlled in January 2022.   She is going to have surgery for spinal stenosis in 2023.  We cleared her for surgery in February 2023.  She was able to walk up stairs without cardiac symptoms.  April 2023 CTA coronaries showed: "Coronary calcium score of 457. This was 4 percentile for age-, sex, and race-matched controls.   2. Normal coronary origin with right dominance.   3. Mild CAD in the LAD and diagonal as outlined.   4. Aortic atherosclerosis.   RECOMMENDATIONS: CAD-RADS 2: Mild non-obstructive CAD (25-49%). Consider non-atherosclerotic causes of chest pain. Consider preventive therapy and risk factor modification."  EF by echo had improved to 45 to 50%.  She has had persistent nausea since 2/23 back surgery.  Now she needs endoscopy.   Denies : Chest pain. Dizziness. Nitroglycerin use. Orthopnea.  Palpitations. Paroxysmal nocturnal dyspnea. Shortness of breath. Syncope.    Occasional ankle edema.    Past Medical History:  Diagnosis Date   Diabetes mellitus without complication (HCC)    Type 2   Genital herpes    GERD (gastroesophageal reflux disease)    Hypertension    Interstitial cystitis    Left bundle branch block (LBBB)    chronic   Leukocytosis    Osteoarthritis    Plantar fasciitis     Past Surgical History:  Procedure Laterality Date   CARPAL TUNNEL RELEASE Bilateral    CESAREAN SECTION     twice 1978 and [place] Bilateral 2021   cataracts removed   SHOULDER SURGERY Right 2012   rotator cuff repair   TUBAL LIGATION  1987     Current Outpatient Medications  Medication Sig Dispense Refill   acetaminophen (TYLENOL) 500 MG tablet Take 1,000 mg by mouth every 6 (six) hours as needed.     allopurinol (ZYLOPRIM) 300 MG tablet Take 300 mg by mouth daily.     amLODipine (NORVASC) 10 MG tablet Take 10 mg by mouth daily.     aspirin EC 81 MG tablet Take 1 tablet (81 mg total) by mouth daily.  Swallow whole. 90 tablet 3   celecoxib (CELEBREX) 200 MG capsule Take 200 mg by mouth daily.     cetirizine (ZYRTEC) 10 MG tablet Take 10 mg by mouth daily.     colchicine 0.6 MG tablet TAKE 1 TABLET BY MOUTH TWICE A DAY 30 tablet 0   hydrochlorothiazide (MICROZIDE) 12.5 MG capsule Take 12.5 mg by mouth daily.     HYDROcodone-acetaminophen (NORCO/VICODIN) 5-325 MG tablet Take 1-2 tablets by mouth every 4 (four) hours as needed for severe pain ((score 7 to 10)). 30 tablet 0   irbesartan (AVAPRO) 300 MG tablet TAKE 1 TABLET BY MOUTH DAILY 90 tablet 3   metFORMIN (GLUCOPHAGE) 1000 MG tablet Take 1,000 mg by mouth 2 (two) times daily with a meal.     metoprolol tartrate (LOPRESSOR) 25 MG tablet Take 75 mg by mouth 2 (two) times daily.     Multiple Vitamins-Minerals (EMERGEN-C IMMUNE PLUS) PACK Take 1 packet by mouth daily.      nystatin cream (MYCOSTATIN) Apply 1 application topically daily as needed for rash.     omeprazole (PRILOSEC) 40 MG capsule Take 40 mg by mouth daily.     Pentosan Polysulfate Sodium 200 MG CPDR Take 200 mg by mouth 2 (two) times daily.     polyvinyl alcohol (LIQUIFILM TEARS) 1.4 % ophthalmic solution Place 1 drop into both eyes as needed for dry eyes.     pravastatin (PRAVACHOL) 40 MG tablet Take 40 mg by mouth daily.     trimethoprim (TRIMPEX) 100 MG tablet Take 100 mg by mouth daily.     vitamin B-12 (CYANOCOBALAMIN) 1000 MCG tablet Take 1,000 mcg by mouth daily.     No current facility-administered medications for this visit.    Allergies:   Quinapril hcl, Sitagliptin, Tioconazole, Lisinopril, Miconazole, and Penicillins    Social History:  The patient  reports that she has never smoked. She has never used smokeless tobacco. She reports current alcohol use. She reports that she does not use drugs.   Family History:  The patient's family history includes Alcoholism in her brother; Asthma in her brother; CAD in her father; CVA in her father; Diabetes in her mother; Diabetes type II in her maternal grandfather; Gout in her father; Heart attack in her father; Hypertension in her father and mother; Kidney failure in her father; Other in her brother and mother; Pneumonia in her mother; Skin cancer in her father.    ROS:  Please see the history of present illness.   Otherwise, review of systems are positive for ankle edema when she wears the wrong shoes.   All other systems are reviewed and negative.    PHYSICAL EXAM: VS:  BP 120/70 (BP Location: Left Arm, Patient Position: Sitting, Cuff Size: Normal)   Pulse 82   Ht 5' 1.5" (1.562 m)   Wt 218 lb (98.9 kg)   BMI 40.52 kg/m  , BMI Body mass index is 40.52 kg/m. GEN: Well nourished, well developed, in no acute distress HEENT: normal Neck: no JVD, carotid bruits, or masses Cardiac: RRR; no murmurs, rubs, or gallops,no edema  Respiratory:   clear to auscultation bilaterally, normal work of breathing GI: soft, nontender, nondistended, + BS MS: no deformity or atrophy Skin: warm and dry, no rash Neuro:  Strength and sensation are intact Psych: euthymic mood, full affect   EKG:   The ekg ordered today demonstrates NSR, LBBB   Recent Labs: 02/05/2022: ALT 29 02/06/2022: B Natriuretic Peptide 214.9; Magnesium 1.9 02/07/2022: Hemoglobin  8.7; Platelets 287 04/17/2022: BUN 10; Creatinine, Ser 0.82; Potassium 4.4; Sodium 142   Lipid Panel    Component Value Date/Time   CHOL 118 02/07/2022 0253   TRIG 273 (H) 02/07/2022 0253   HDL 27 (L) 02/07/2022 0253   CHOLHDL 4.4 02/07/2022 0253   VLDL 55 (H) 02/07/2022 0253   LDLCALC 36 02/07/2022 0253     Other studies Reviewed: Additional studies/ records that were reviewed today with results demonstrating: hospital records reviewed, labs reviewed.   ASSESSMENT AND PLAN:  Hypertension: The current medical regimen is effective;  continue present plan and medications.  Home readings are well controlled.  No further cardiac testing needed prior to EGD.  Would try to avoid excessive IV fluids administration. Coronary artery calcification: Nonobstructive disease by CTA and April 2023. Chronic systolic heart failure: Prior echo from April 2023 showed ejection fraction of 45 to 50%.  There was septal hypokinesis and dyssynchrony consistent with left bundle branch block. Left bundle branch block: Chronic Hyperlipidemia: Total cholesterol 147 LDL 66 HDL 51 triglycerides 182.  Continue pravastatin.  Once stomach issues are settled, consider switching pravastatin to rosuvastatin for higher potency. Diabetes: Well controlled.  A1c 6.2. Morbid obesity: Long-term, weight loss will be beneficial.   Current medicines are reviewed at length with the patient today.  The patient concerns regarding her medicines were addressed.  The following changes have been made:  No change  Labs/ tests ordered  today include:  No orders of the defined types were placed in this encounter.   Recommend 150 minutes/week of aerobic exercise Low fat, low carb, high fiber diet recommended  Disposition:   FU in 1 year   Signed, Larae Grooms, MD  07/12/2022 2:18 PM    [facility] [place], [place], [place]  [address] Phone: [phone]; Fax: [phone]

## 2022-07-12 ENCOUNTER — Encounter: Payer: Self-pay | Admitting: Interventional Cardiology

## 2022-07-12 ENCOUNTER — Ambulatory Visit: Payer: PPO | Admitting: Interventional Cardiology

## 2022-07-12 VITALS — BP 120/70 | HR 82 | Ht 61.5 in | Wt 218.0 lb

## 2022-07-12 DIAGNOSIS — I10 Essential (primary) hypertension: Secondary | ICD-10-CM

## 2022-07-12 DIAGNOSIS — I447 Left bundle-branch block, unspecified: Secondary | ICD-10-CM

## 2022-07-12 DIAGNOSIS — I340 Nonrheumatic mitral (valve) insufficiency: Secondary | ICD-10-CM | POA: Diagnosis not present

## 2022-07-12 DIAGNOSIS — I5022 Chronic systolic (congestive) heart failure: Secondary | ICD-10-CM | POA: Diagnosis not present

## 2022-07-12 DIAGNOSIS — I25118 Atherosclerotic heart disease of native coronary artery with other forms of angina pectoris: Secondary | ICD-10-CM

## 2022-07-12 NOTE — Patient Instructions (Signed)
Medication Instructions:  Your physician recommends that you continue on your current medications as directed. Please refer to the Current Medication list given to you today.  *If you need a refill on your cardiac medications before your next appointment, please call your pharmacy*   Lab Work: none If you have labs (blood work) drawn today and your tests are completely normal, you will receive your results only by: [place] (if you have MyChart) OR A paper copy in the mail If you have any lab test that is abnormal or we need to change your treatment, we will call you to review the results.   Testing/Procedures: none   Follow-Up: At [facility], you and your health needs are our priority.  As part of our continuing mission to provide you with exceptional heart care, we have created designated Provider Care Teams.  These Care Teams include your primary Cardiologist (physician) and Advanced Practice Providers (APPs -  Physician Assistants and Nurse Practitioners) who all work together to provide you with the care you need, when you need it.  We recommend signing up for the patient portal called "MyChart".  Sign up information is provided on this After Visit Summary.  MyChart is used to connect with patients for Virtual Visits (Telemedicine).  Patients are able to view lab/test results, encounter notes, upcoming appointments, etc.  Non-urgent messages can be sent to your provider as well.   To learn more about what you can do with MyChart, go to [email or web address].    Your next appointment:   February 2024  The format for your next appointment:   In Person  Provider:   Larae Grooms, MD     Other Instructions    Important Information About Sugar

## 2022-07-19 DIAGNOSIS — E1169 Type 2 diabetes mellitus with other specified complication: Secondary | ICD-10-CM | POA: Diagnosis not present

## 2022-07-19 DIAGNOSIS — E782 Mixed hyperlipidemia: Secondary | ICD-10-CM | POA: Diagnosis not present

## 2022-07-19 DIAGNOSIS — I10 Essential (primary) hypertension: Secondary | ICD-10-CM | POA: Diagnosis not present

## 2022-07-19 NOTE — Telephone Encounter (Signed)
   Patient Name: Diane Mayo  DOB: Jul 05, 1951 MRN: [id number]  Primary Cardiologist: Larae Grooms, MD  Chart reviewed as part of pre-operative protocol coverage. Given past medical history and time since last visit, based on ACC/AHA guidelines, [name] would be at acceptable risk for the planned procedure without further cardiovascular testing.   Patient was seen by Dr. Irish Lack on 7/19 who cleared the patient to proceed with endoscopy procedure. If needed, she may hold ASA for 5 days prior to the procedure and restart as soon as possible afterward.  I will route this recommendation to the requesting party via Epic fax function and remove from pre-op pool.  Please call with questions.  [place], [place] 07/19/2022, 8:22 AM

## 2022-07-20 DIAGNOSIS — M4316 Spondylolisthesis, lumbar region: Secondary | ICD-10-CM | POA: Diagnosis not present

## 2022-07-20 DIAGNOSIS — M7061 Trochanteric bursitis, right hip: Secondary | ICD-10-CM | POA: Diagnosis not present

## 2022-08-15 DIAGNOSIS — K2980 Duodenitis without bleeding: Secondary | ICD-10-CM | POA: Diagnosis not present

## 2022-08-15 DIAGNOSIS — K259 Gastric ulcer, unspecified as acute or chronic, without hemorrhage or perforation: Secondary | ICD-10-CM | POA: Diagnosis not present

## 2022-08-15 DIAGNOSIS — K449 Diaphragmatic hernia without obstruction or gangrene: Secondary | ICD-10-CM | POA: Diagnosis not present

## 2022-08-15 DIAGNOSIS — R1013 Epigastric pain: Secondary | ICD-10-CM | POA: Diagnosis not present

## 2022-08-15 DIAGNOSIS — K2970 Gastritis, unspecified, without bleeding: Secondary | ICD-10-CM | POA: Diagnosis not present

## 2022-08-15 DIAGNOSIS — K319 Disease of stomach and duodenum, unspecified: Secondary | ICD-10-CM | POA: Diagnosis not present

## 2022-08-15 DIAGNOSIS — R112 Nausea with vomiting, unspecified: Secondary | ICD-10-CM | POA: Diagnosis not present

## 2022-08-15 DIAGNOSIS — K317 Polyp of stomach and duodenum: Secondary | ICD-10-CM | POA: Diagnosis not present

## 2022-08-17 DIAGNOSIS — K319 Disease of stomach and duodenum, unspecified: Secondary | ICD-10-CM | POA: Diagnosis not present

## 2022-08-17 DIAGNOSIS — K317 Polyp of stomach and duodenum: Secondary | ICD-10-CM | POA: Diagnosis not present

## 2022-08-17 DIAGNOSIS — K2980 Duodenitis without bleeding: Secondary | ICD-10-CM | POA: Diagnosis not present

## 2022-08-30 ENCOUNTER — Other Ambulatory Visit (HOSPITAL_COMMUNITY): Payer: Self-pay | Admitting: Gastroenterology

## 2022-08-30 DIAGNOSIS — R112 Nausea with vomiting, unspecified: Secondary | ICD-10-CM

## 2022-09-04 DIAGNOSIS — E782 Mixed hyperlipidemia: Secondary | ICD-10-CM | POA: Diagnosis not present

## 2022-09-04 DIAGNOSIS — M1000 Idiopathic gout, unspecified site: Secondary | ICD-10-CM | POA: Diagnosis not present

## 2022-09-04 DIAGNOSIS — M47812 Spondylosis without myelopathy or radiculopathy, cervical region: Secondary | ICD-10-CM | POA: Diagnosis not present

## 2022-09-04 DIAGNOSIS — E1169 Type 2 diabetes mellitus with other specified complication: Secondary | ICD-10-CM | POA: Diagnosis not present

## 2022-09-04 DIAGNOSIS — Z23 Encounter for immunization: Secondary | ICD-10-CM | POA: Diagnosis not present

## 2022-09-04 DIAGNOSIS — K219 Gastro-esophageal reflux disease without esophagitis: Secondary | ICD-10-CM | POA: Diagnosis not present

## 2022-09-04 DIAGNOSIS — I10 Essential (primary) hypertension: Secondary | ICD-10-CM | POA: Diagnosis not present

## 2022-09-04 DIAGNOSIS — D72829 Elevated white blood cell count, unspecified: Secondary | ICD-10-CM | POA: Diagnosis not present

## 2022-09-04 DIAGNOSIS — M25551 Pain in right hip: Secondary | ICD-10-CM | POA: Diagnosis not present

## 2022-09-08 ENCOUNTER — Ambulatory Visit (HOSPITAL_COMMUNITY)
Admission: RE | Admit: 2022-09-08 | Discharge: 2022-09-08 | Disposition: A | Discharge status: Home / Self Care | Payer: PPO | Source: Ambulatory Visit | Attending: Gastroenterology | Admitting: Gastroenterology

## 2022-09-08 DIAGNOSIS — R112 Nausea with vomiting, unspecified: Secondary | ICD-10-CM | POA: Diagnosis not present

## 2022-09-08 DIAGNOSIS — R109 Unspecified abdominal pain: Secondary | ICD-10-CM | POA: Diagnosis not present

## 2022-09-08 MED ORDER — TECHNETIUM TC 99M MEBROFENIN IV KIT
5.0400 | PACK | Freq: Once | INTRAVENOUS | Status: AC
  Administered 2022-09-08: 5.04 via INTRAVENOUS

## 2022-09-27 DIAGNOSIS — R10811 Right upper quadrant abdominal tenderness: Secondary | ICD-10-CM | POA: Diagnosis not present

## 2022-09-27 DIAGNOSIS — R112 Nausea with vomiting, unspecified: Secondary | ICD-10-CM | POA: Diagnosis not present

## 2022-09-27 DIAGNOSIS — K259 Gastric ulcer, unspecified as acute or chronic, without hemorrhage or perforation: Secondary | ICD-10-CM | POA: Diagnosis not present

## 2022-09-27 DIAGNOSIS — K828 Other specified diseases of gallbladder: Secondary | ICD-10-CM | POA: Diagnosis not present

## 2022-09-27 DIAGNOSIS — K2970 Gastritis, unspecified, without bleeding: Secondary | ICD-10-CM | POA: Diagnosis not present

## 2022-09-27 DIAGNOSIS — R1010 Upper abdominal pain, unspecified: Secondary | ICD-10-CM | POA: Diagnosis not present

## 2022-09-27 DIAGNOSIS — R10816 Epigastric abdominal tenderness: Secondary | ICD-10-CM | POA: Diagnosis not present

## 2022-09-27 DIAGNOSIS — D649 Anemia, unspecified: Secondary | ICD-10-CM | POA: Diagnosis not present

## 2022-10-03 ENCOUNTER — Ambulatory Visit: Payer: Self-pay | Admitting: Surgery

## 2022-10-03 DIAGNOSIS — K828 Other specified diseases of gallbladder: Secondary | ICD-10-CM | POA: Diagnosis not present

## 2022-10-03 DIAGNOSIS — M4316 Spondylolisthesis, lumbar region: Secondary | ICD-10-CM | POA: Diagnosis not present

## 2022-10-03 NOTE — H&P (View-Only) (Signed)
Diane Mayo [id number]   Referring Provider:  Otis Brace, MD   Subjective   Chief Complaint: New Consultation (Gallbladder eval)     History of Present Illness:    71 year old woman with history of diabetes, GERD, hypertension, interstitial cystitis, osteoarthritis who presents for evaluation of biliary dyskinesia.  She had a back surgery in February of this year, after which she had to wear a brace.  Initially she attributed nausea and epigastric pain to this.  Has had nausea since February of this year.  This is associated with epigastric pain, it is aggravated by eating fried foods or large meals.  She did have some emesis the beginning of this course but that is resolved, though she does occasionally still have dry heaving.  Describes the epigastric pain as pressure.  Minimal improvement with omeprazole.  She states that she was recently started on another medication for a small ulcer and gastritis that were seen on her EGD, and this has helped some although she is still having pain.  She describes it as similar to the beginning of pregnancy where you just feel nauseous all the time and just sick.  Abdominal surgery includes C-section, tubal ligation.  Ultrasound 06/09/2022: No gallstones or wall thickening, common bile duct diameter normal, liver normal HIDA 09/08/2022: Bladder ejection fraction 29% suggestive of biliary dyskinesia EGD: Patient report, a small ulcer and some gastritis was found.    Review of Systems: A complete review of systems was obtained from the patient.  I have reviewed this information and discussed as appropriate with the patient.  See HPI as well for other ROS.   Medical History: Past Medical History:  Diagnosis Date   Arthritis    GERD (gastroesophageal reflux disease)    Hypertension     There is no problem list on file for this patient.   Past Surgical History:  Procedure Laterality Date   rotator cuff surgery  2012   Posterior Lumbar  Iinterbody Fusion   02/01/2022   carpal tunnel surgery     CESAREAN SECTION     02/16/77, 03/13/86     Allergies  Allergen Reactions   Quinapril Hcl Cough    Other reaction(s): cough   Sitagliptin Other (See Comments)    Other reaction(s): thrush   Tioconazole Rash    Other reaction(s): rash   Lisinopril Other (See Comments)    Other reaction(s): cough   Miconazole Itching and Swelling    Monistat   Penicillins Itching and Rash    Other reaction(s): trouble breathing, rash    Current Outpatient Medications on File Prior to Visit  Medication Sig Dispense Refill   allopurinoL (ZYLOPRIM) 300 MG tablet TAKE 1 TABLET BY MOUTH DAILY 90     amLODIPine (NORVASC) 10 MG tablet Take 10 mg by mouth every morning     cetirizine (ZYRTEC) 10 MG tablet Take 10 mg by mouth once daily     colchicine, gout, (COLCRYS) 0.6 mg tablet TAKE 1 TABLET BY MOUTH TWICE DAILY AS NEEDED FOR ACUTE FOR GOUT     hydroCHLOROthiazide (MICROZIDE) 12.5 mg capsule Take 12.5 mg by mouth every morning     irbesartan (AVAPRO) 300 MG tablet Take 300 mg by mouth every morning     metFORMIN (GLUCOPHAGE) 1000 MG tablet Take 1,000 mg by mouth 2 (two) times daily     metoprolol tartrate (LOPRESSOR) 25 MG tablet Take 75 mg by mouth 2 (two) times daily     omeprazole (PRILOSEC) 40 MG DR capsule TAKE  1 CAPSULE BY MOUTH EVERY MORNING AND EVENING 30 MINUTES BEFORE MEAL TIME     pentosan polysulfate (ELMIRON) 100 mg capsule Take 200 mg by mouth 2 (two) times daily     pravastatin (PRAVACHOL) 40 MG tablet TAKE 1 TABLET BY MOUTH BY MOUTH EVERY EVENING     sucralfate (CARAFATE) 1 gram tablet Take by mouth every 6 (six) hours     No current facility-administered medications on file prior to visit.    Family History  Problem Relation Age of Onset   Obesity Mother    Diabetes Mother    Skin cancer Father    Obesity Father    High blood pressure (Hypertension) Father    Coronary Artery Disease (Blocked arteries around heart) Father       Social History   Tobacco Use  Smoking Status Former   Types: Cigarettes   Quit date: 1987   Years since quitting: 36.7  Smokeless Tobacco Never     Social History   Socioeconomic History   Marital status: Married  Tobacco Use   Smoking status: Former    Types: Cigarettes    Quit date: 1987    Years since quitting: 36.7   Smokeless tobacco: Never  Substance and Sexual Activity   Alcohol use: Never   Drug use: Never    Objective:    Vitals:   10/03/22 1540  BP: 128/68  Pulse: 86  SpO2: 98%  Weight: 97.3 kg (214 lb 6.4 oz)  Height: 156.2 cm (5' 1.5")    Body mass index is 39.85 kg/m.  Alert and well-appearing Unlabored respirations Abdomen is soft, obese, mildly tender in the epigastrium and right subcostal region  Assessment and Plan:  Diagnoses and all orders for this visit:  Biliary dyskinesia    While some of her symptoms may be related to gastritis and the small ulcer, she is still having some symptoms despite treatment.  I do think it would be reasonable to proceed with cholecystectomy. Discussed the relevant anatomy using a diagram to demonstrate, and went over surgical technique.  Discussed risks of surgery including bleeding, pain, scarring, intraabdominal injury specifically to the common bile duct and sequelae, bile leak, conversion to open surgery, failure to resolve symptoms, blood clots/ pulmonary embolus, heart attack, pneumonia, stroke, death. Questions welcomed and answered to patient's satisfaction.  I think the most reasonable course of action would be to go ahead and start the scheduling process, anticipating at least 3 to 4 weeks between now and surgery, while she is continuing treatment for the small gastric ulcer which was started about a week ago.  If she starts to feel significantly better, we will easily be able to cancel her surgery.  But I anticipate she is still going to be having some issues as much of this does sound like biliary  dyskinesia.   Yamen Castrogiovanni Raquel James, MD

## 2022-10-03 NOTE — H&P (Signed)
Diane Mayo [id number]   Referring Provider:  Otis Brace, MD   Subjective   Chief Complaint: New Consultation (Gallbladder eval)     History of Present Illness:    71 year old woman with history of diabetes, GERD, hypertension, interstitial cystitis, osteoarthritis who presents for evaluation of biliary dyskinesia.  She had a back surgery in February of this year, after which she had to wear a brace.  Initially she attributed nausea and epigastric pain to this.  Has had nausea since February of this year.  This is associated with epigastric pain, it is aggravated by eating fried foods or large meals.  She did have some emesis the beginning of this course but that is resolved, though she does occasionally still have dry heaving.  Describes the epigastric pain as pressure.  Minimal improvement with omeprazole.  She states that she was recently started on another medication for a small ulcer and gastritis that were seen on her EGD, and this has helped some although she is still having pain.  She describes it as similar to the beginning of pregnancy where you just feel nauseous all the time and just sick.  Abdominal surgery includes C-section, tubal ligation.  Ultrasound 06/09/2022: No gallstones or wall thickening, common bile duct diameter normal, liver normal HIDA 09/08/2022: Bladder ejection fraction 29% suggestive of biliary dyskinesia EGD: Patient report, a small ulcer and some gastritis was found.    Review of Systems: A complete review of systems was obtained from the patient.  I have reviewed this information and discussed as appropriate with the patient.  See HPI as well for other ROS.   Medical History: Past Medical History:  Diagnosis Date   Arthritis    GERD (gastroesophageal reflux disease)    Hypertension     There is no problem list on file for this patient.   Past Surgical History:  Procedure Laterality Date   rotator cuff surgery  2012   Posterior Lumbar  Iinterbody Fusion   02/01/2022   carpal tunnel surgery     CESAREAN SECTION     02/16/77, 03/13/86     Allergies  Allergen Reactions   Quinapril Hcl Cough    Other reaction(s): cough   Sitagliptin Other (See Comments)    Other reaction(s): thrush   Tioconazole Rash    Other reaction(s): rash   Lisinopril Other (See Comments)    Other reaction(s): cough   Miconazole Itching and Swelling    Monistat   Penicillins Itching and Rash    Other reaction(s): trouble breathing, rash    Current Outpatient Medications on File Prior to Visit  Medication Sig Dispense Refill   allopurinoL (ZYLOPRIM) 300 MG tablet TAKE 1 TABLET BY MOUTH DAILY 90     amLODIPine (NORVASC) 10 MG tablet Take 10 mg by mouth every morning     cetirizine (ZYRTEC) 10 MG tablet Take 10 mg by mouth once daily     colchicine, gout, (COLCRYS) 0.6 mg tablet TAKE 1 TABLET BY MOUTH TWICE DAILY AS NEEDED FOR ACUTE FOR GOUT     hydroCHLOROthiazide (MICROZIDE) 12.5 mg capsule Take 12.5 mg by mouth every morning     irbesartan (AVAPRO) 300 MG tablet Take 300 mg by mouth every morning     metFORMIN (GLUCOPHAGE) 1000 MG tablet Take 1,000 mg by mouth 2 (two) times daily     metoprolol tartrate (LOPRESSOR) 25 MG tablet Take 75 mg by mouth 2 (two) times daily     omeprazole (PRILOSEC) 40 MG DR capsule TAKE  1 CAPSULE BY MOUTH EVERY MORNING AND EVENING 30 MINUTES BEFORE MEAL TIME     pentosan polysulfate (ELMIRON) 100 mg capsule Take 200 mg by mouth 2 (two) times daily     pravastatin (PRAVACHOL) 40 MG tablet TAKE 1 TABLET BY MOUTH BY MOUTH EVERY EVENING     sucralfate (CARAFATE) 1 gram tablet Take by mouth every 6 (six) hours     No current facility-administered medications on file prior to visit.    Family History  Problem Relation Age of Onset   Obesity Mother    Diabetes Mother    Skin cancer Father    Obesity Father    High blood pressure (Hypertension) Father    Coronary Artery Disease (Blocked arteries around heart) Father       Social History   Tobacco Use  Smoking Status Former   Types: Cigarettes   Quit date: 1987   Years since quitting: 36.7  Smokeless Tobacco Never     Social History   Socioeconomic History   Marital status: Married  Tobacco Use   Smoking status: Former    Types: Cigarettes    Quit date: 1987    Years since quitting: 36.7   Smokeless tobacco: Never  Substance and Sexual Activity   Alcohol use: Never   Drug use: Never    Objective:    Vitals:   10/03/22 1540  BP: 128/68  Pulse: 86  SpO2: 98%  Weight: 97.3 kg (214 lb 6.4 oz)  Height: 156.2 cm (5' 1.5")    Body mass index is 39.85 kg/m.  Alert and well-appearing Unlabored respirations Abdomen is soft, obese, mildly tender in the epigastrium and right subcostal region  Assessment and Plan:  Diagnoses and all orders for this visit:  Biliary dyskinesia    While some of her symptoms may be related to gastritis and the small ulcer, she is still having some symptoms despite treatment.  I do think it would be reasonable to proceed with cholecystectomy. Discussed the relevant anatomy using a diagram to demonstrate, and went over surgical technique.  Discussed risks of surgery including bleeding, pain, scarring, intraabdominal injury specifically to the common bile duct and sequelae, bile leak, conversion to open surgery, failure to resolve symptoms, blood clots/ pulmonary embolus, heart attack, pneumonia, stroke, death. Questions welcomed and answered to patient's satisfaction.  I think the most reasonable course of action would be to go ahead and start the scheduling process, anticipating at least 3 to 4 weeks between now and surgery, while she is continuing treatment for the small gastric ulcer which was started about a week ago.  If she starts to feel significantly better, we will easily be able to cancel her surgery.  But I anticipate she is still going to be having some issues as much of this does sound like biliary  dyskinesia.   Tomie Elko Raquel James, MD

## 2022-10-04 ENCOUNTER — Telehealth: Payer: Self-pay | Admitting: *Deleted

## 2022-10-04 NOTE — Telephone Encounter (Signed)
   Pre-operative Risk Assessment    Patient Name: Diane Mayo  DOB: 03-15-1951 MRN: [id number]      Request for Surgical Clearance    Procedure:   LAPAROSCOPIC OR ROBOTIC CHOLECYSTECTOMY  Date of Surgery:  Clearance TBD                                 Surgeon:  Jens Som, MD Surgeon's Group or Practice Name:  [facility] Phone number:  [phone] Fax number:  [phone]  ATTN:  Karren Cobble, CMA   Type of Clearance Requested:   - Medical  - Pharmacy:  Hold Aspirin NOT LISTED ON CLEARANCE BUT PT ON ASPIRIN   Type of Anesthesia:  General    Additional requests/questions:    Astrid Divine   10/04/2022, 7:57 AM

## 2022-10-04 NOTE — Telephone Encounter (Signed)
[place], MD   Preoperative team, please contact this patient and set up a phone call appointment for further preoperative risk assessment. Please obtain consent and complete medication review. Thank you for your help.   She may hold aspirin for 5-7 days at surgeon's discretion pending no current symptoms of ACS which will be assessed during virtual visit.    Emmaline Life, NP-C  10/04/2022, 8:42 AM [address] Office [phone] Fax [phone]

## 2022-10-04 NOTE — Telephone Encounter (Signed)
Patient is returning call.  °

## 2022-10-04 NOTE — Telephone Encounter (Signed)
Left message to call back for tele pre op appt 

## 2022-10-04 NOTE — Telephone Encounter (Signed)
I tried to call the pt back but no answer

## 2022-10-05 ENCOUNTER — Telehealth: Payer: Self-pay | Admitting: *Deleted

## 2022-10-05 NOTE — Telephone Encounter (Signed)
Pt returning call

## 2022-10-05 NOTE — Telephone Encounter (Signed)
Pt agreeable to plan of care for tele pre op appt 10/06/22 at 11 am. Pt tells me that her surgery is 10/16/22. Though clearance came to [place] as TBD. Due to med hold and date of procedure pt has been added on to 10/06/22 @ 11am.   Med rec and consent are done.     Patient Consent for Virtual Visit        Diane Mayo has provided verbal consent on 10/05/2022 for a virtual visit (video or telephone).   CONSENT FOR VIRTUAL VISIT FOR:  Diane Mayo  By participating in this virtual visit I agree to the following:  I hereby voluntarily request, consent and authorize [facility] and its employed or contracted physicians, physician assistants, nurse practitioners or other licensed health care professionals (the Practitioner), to provide me with telemedicine health care services (the "Services") as deemed necessary by the treating Practitioner. I acknowledge and consent to receive the Services by the Practitioner via telemedicine. I understand that the telemedicine visit will involve communicating with the Practitioner through live audiovisual communication technology and the disclosure of certain medical information by electronic transmission. I acknowledge that I have been given the opportunity to request an in-person assessment or other available alternative prior to the telemedicine visit and am voluntarily participating in the telemedicine visit.  I understand that I have the right to withhold or withdraw my consent to the use of telemedicine in the course of my care at any time, without affecting my right to future care or treatment, and that the Practitioner or I may terminate the telemedicine visit at any time. I understand that I have the right to inspect all information obtained and/or recorded in the course of the telemedicine visit and may receive copies of available information for a reasonable fee.  I understand that some of the potential risks of receiving the Services via  telemedicine include:  Delay or interruption in medical evaluation due to technological equipment failure or disruption; Information transmitted may not be sufficient (e.g. poor resolution of images) to allow for appropriate medical decision making by the Practitioner; and/or  In rare instances, security protocols could fail, causing a breach of personal health information.  Furthermore, I acknowledge that it is my responsibility to provide information about my medical history, conditions and care that is complete and accurate to the best of my ability. I acknowledge that Practitioner's advice, recommendations, and/or decision may be based on factors not within their control, such as incomplete or inaccurate data provided by me or distortions of diagnostic images or specimens that may result from electronic transmissions. I understand that the practice of medicine is not an exact science and that Practitioner makes no warranties or guarantees regarding treatment outcomes. I acknowledge that a copy of this consent can be made available to me via my patient portal ([facility]), or I can request a printed copy by calling the office of [facility].    I understand that my insurance will be billed for this visit.   I have read or had this consent read to me. I understand the contents of this consent, which adequately explains the benefits and risks of the Services being provided via telemedicine.  I have been provided ample opportunity to ask questions regarding this consent and the Services and have had my questions answered to my satisfaction. I give my informed consent for the services to be provided through the use of telemedicine in my medical care

## 2022-10-05 NOTE — Telephone Encounter (Signed)
Pt agreeable to plan of care for tele pre op appt 10/06/22 at 11 am. Pt tells me that her surgery is 10/16/22. Though clearance came to [place] as TBD. Due to med hold and date of procedure pt has been added on to 10/06/22 @ 11am.    Med rec and consent are done.

## 2022-10-06 ENCOUNTER — Ambulatory Visit: Payer: PPO | Attending: Interventional Cardiology | Admitting: Physician Assistant

## 2022-10-06 DIAGNOSIS — Z01810 Encounter for preprocedural cardiovascular examination: Secondary | ICD-10-CM | POA: Diagnosis not present

## 2022-10-06 NOTE — Progress Notes (Signed)
Virtual Visit via Telephone Note   Because of Diane Mayo's co-morbid illnesses, she is at least at moderate risk for complications without adequate follow up.  This format is felt to be most appropriate for this patient at this time.  The patient did not have access to video technology/had technical difficulties with video requiring transitioning to audio format only (telephone).  All issues noted in this document were discussed and addressed.  No physical exam could be performed with this format.  Please refer to the patient's chart for her consent to telehealth for [facility].  Evaluation Performed:  Preoperative cardiovascular risk assessment _____________   Date:  10/06/2022   Patient ID:  Diane Mayo, DOB 1951/09/05, MRN [id number] Patient Location:  Home Provider location:   Office  Primary Care Provider:  Maury Dus, MD Primary Cardiologist:  Larae Grooms, MD  Chief Complaint / Patient Profile   71 y.o. y/o female with a h/o diabetes mellitus type 2, GERD, hypertension, left bundle branch block who is pending laparoscopic cholecystectomy and presents today for telephonic preoperative cardiovascular risk assessment.  Past Medical History    Past Medical History:  Diagnosis Date   Diabetes mellitus without complication ([place])    Type 2   Genital herpes    GERD (gastroesophageal reflux disease)    Hypertension    Interstitial cystitis    Left bundle branch block (LBBB)    chronic   Leukocytosis    Osteoarthritis    Plantar fasciitis    Past Surgical History:  Procedure Laterality Date   CARPAL TUNNEL RELEASE Bilateral    CESAREAN SECTION     twice 1978 and [place]   EYE SURGERY Bilateral 2021   cataracts removed   SHOULDER SURGERY Right 2012   rotator cuff repair   TUBAL LIGATION  1987    Allergies  Allergies  Allergen Reactions   Penicillins Shortness Of Breath, Itching and Rash   Accupril  [Quinapril Hcl] Cough   Januvia [Sitagliptin] Other (See Comments)    thrush   Monistat [Miconazole] Itching and Swelling    Monistat    Tioconazole Rash    Other reaction(s): rash   Zestril [Lisinopril] Cough    History of Present Illness    Diane Mayo is a 71 y.o. female who presents via audio/video conferencing for a telehealth visit today.  Pt was last seen in cardiology clinic on 07/12/2022  By Dr. Irish Lack.  At that time [name] was doing well .  The patient is now pending procedure as outlined above. Since her last visit, she feels a lot better.  She has not had any chest pain or shortness of breath.  She states she has been walking farther and not getting as tired.  She recently had back surgery and was cleared.  Now she is having her gallbladder removed.  She has no issues with walking and can climb the stairs if there is a handrail.  He does not do any yard work but does endorse being able to push a grocery cart all over the store without any issues.  Because of this, she is scored a 5.07 on the DASI.  This exceeds the minimum 4 METS requirement.   We discussed holding aspirin x7 days prior to the procedure.  Please restart medically safe to do so.  Home Medications    Prior to Admission medications   Medication Sig Start Date End Date  Taking? Authorizing Provider  acetaminophen (TYLENOL) 500 MG tablet Take 500-1,000 mg by mouth every 6 (six) hours as needed (pain.).    [provider]  allopurinol (ZYLOPRIM) 300 MG tablet Take 300 mg by mouth in the morning.    [provider]  amLODipine (NORVASC) 10 MG tablet Take 10 mg by mouth in the morning.    [provider]  aspirin EC 81 MG tablet Take 1 tablet (81 mg total) by mouth daily. Swallow whole. 04/20/22   [name], NP  celecoxib (CELEBREX) 200 MG capsule Take 200 mg by mouth daily. Patient not taking: Reported on 10/05/2022 11/15/20   [provider]  cetirizine (ZYRTEC) 10  MG tablet Take 10 mg by mouth in the morning.    [provider]  colchicine 0.6 MG tablet TAKE 1 TABLET BY MOUTH TWICE A DAY Patient not taking: Reported on 10/05/2022 08/25/19   [name], DPM  hydrochlorothiazide (MICROZIDE) 12.5 MG capsule Take 12.5 mg by mouth in the morning. 12/06/21   [provider]  irbesartan (AVAPRO) 300 MG tablet TAKE 1 TABLET BY MOUTH DAILY 01/18/21   [name], MD  metFORMIN (GLUCOPHAGE) 1000 MG tablet Take 1,000 mg by mouth 2 (two) times daily with a meal.    [provider]  metoprolol tartrate (LOPRESSOR) 25 MG tablet Take 75 mg by mouth 2 (two) times daily. 10/18/19   [provider]  Multiple Vitamin (MULTIVITAMIN WITH MINERALS) TABS tablet Take 1 tablet by mouth daily. Centrum for Women 50+ with Iron Patient not taking: Reported on 10/05/2022    [provider]  Multiple Vitamins-Minerals (EMERGEN-C IMMUNE PLUS) PACK Take 1 packet by mouth 3 (three) times a week.    [provider]  nystatin cream (MYCOSTATIN) Apply 1 application topically daily as needed for rash.    [provider]  omeprazole (PRILOSEC) 40 MG capsule Take 40 mg by mouth in the morning.    [provider]  Pentosan Polysulfate Sodium 200 MG CPDR Take 200 mg by mouth 2 (two) times daily.    [provider]  polyvinyl alcohol (LIQUIFILM TEARS) 1.4 % ophthalmic solution Place 1 drop into both eyes as needed for dry eyes.    [provider]  pravastatin (PRAVACHOL) 40 MG tablet Take 40 mg by mouth every evening.    [provider]  sucralfate (CARAFATE) 1 g tablet Take 1 g by mouth 4 (four) times daily -  with meals and at bedtime.    [provider]  trimethoprim (TRIMPEX) 100 MG tablet Take 100 mg by mouth every evening. 12/13/20   [provider]  valACYclovir (VALTREX) 500 MG tablet Take 500 mg by mouth 2 (two) times daily as needed (outbreaks (blisters)).     [provider]  vitamin B-12 (CYANOCOBALAMIN) 1000 MCG tablet Take 1,000 mcg by mouth daily. Patient not taking: Reported on 10/05/2022    [provider]    Physical Exam    Vital Signs:  [name] does not have vital signs available for review today.  Given telephonic nature of communication, physical exam is limited. AAOx3. NAD. Normal affect.  Speech and respirations are unlabored.  Accessory Clinical Findings    None  Assessment & Plan    1.  Preoperative Cardiovascular Risk Assessment:  Ms. Flaten's perioperative risk of a major cardiac event is 0.9% according to the Revised Cardiac Risk Index (RCRI).  Therefore, she is at low risk for perioperative complications.  Her functional capacity is good at 5.07 METs according to the [place] Activity Status Index (DASI). Recommendations: According to ACC/AHA guidelines, no further cardiovascular testing needed.  The patient may proceed to surgery at acceptable risk.   Antiplatelet and/or Anticoagulation Recommendations: Aspirin can be held for 7 days prior to her surgery.  Please resume Aspirin post operatively when it is felt to be safe from a bleeding standpoint.   A copy of this note will be routed to requesting surgeon.  Time:   Today, I have spent 10 minutes with the patient with telehealth technology discussing medical history, symptoms, and management plan.     Elgie Collard, PA-C  10/06/2022, 11:49 AM

## 2022-10-10 NOTE — Patient Instructions (Signed)
DUE TO COVID-19 ONLY TWO VISITORS  (aged 70 and older)  ARE ALLOWED TO COME WITH YOU AND STAY IN THE WAITING ROOM ONLY DURING PRE OP AND PROCEDURE.   **NO VISITORS ARE ALLOWED IN THE SHORT STAY AREA OR RECOVERY ROOM!!**  IF YOU WILL BE ADMITTED INTO THE HOSPITAL YOU ARE ALLOWED ONLY FOUR SUPPORT PEOPLE DURING VISITATION HOURS ONLY (7 AM -8PM)   The support person(s) must pass our screening, gel in and out, and wear a mask at all times, including in the patient's room. Patients must also wear a mask when staff or their support person are in the room. Visitors GUEST BADGE MUST BE WORN VISIBLY  One adult visitor may remain with you overnight and MUST be in the room by 8 P.M.     Your procedure is scheduled on: 10/16/22   Report to [facility] Main Entrance    Report to admitting at : 10:35 AM   Call this number if you have problems the morning of surgery [phone]  Eat a light diet the day before surgery.  Examples including soups, broths, toast, yogurt, mashed potatoes.  Things to avoid include carbonated beverages (fizzy beverages), raw fruits and raw vegetables, or beans.   If your bowels are filled with gas, your surgeon will have difficulty visualizing your pelvic organs which increases your surgical risks.    Do not eat food :After Midnight.   After Midnight you may have the following liquids until: 9:45 AM DAY OF SURGERY  Water Black Coffee (sugar ok, NO MILK/CREAM OR CREAMERS)  Tea (sugar ok, NO MILK/CREAM OR CREAMERS) regular and decaf                             Plain Jell-O (NO RED)                                           Fruit ices (not with fruit pulp, NO RED)                                     Popsicles (NO RED)                                                                  Juice: apple, WHITE grape, WHITE cranberry Sports drinks like Gatorade (NO RED)               Oral Hygiene is also important to reduce your risk of infection.                                     Remember - BRUSH YOUR TEETH THE MORNING OF SURGERY WITH YOUR REGULAR TOOTHPASTE   Do NOT smoke after Midnight   Take these medicines the morning of surgery with A SIP OF WATER: cetirizine,amlodipine,allopurinol,omeprazole,pentosan.Tylenol as needed.  How to Manage Your Diabetes Before and After Surgery  Why is it important to control my blood sugar before and after surgery? Improving  blood sugar levels before and after surgery helps healing and can limit problems. A way of improving blood sugar control is eating a healthy diet by:  Eating less sugar and carbohydrates  Increasing activity/exercise  Talking with your doctor about reaching your blood sugar goals High blood sugars (greater than 180 mg/dL) can raise your risk of infections and slow your recovery, so you will need to focus on controlling your diabetes during the weeks before surgery. Make sure that the doctor who takes care of your diabetes knows about your planned surgery including the date and location.  How do I manage my blood sugar before surgery? Check your blood sugar at least 4 times a day, starting 2 days before surgery, to make sure that the level is not too high or low. Check your blood sugar the morning of your surgery when you wake up and every 2 hours until you get to the Short Stay unit. If your blood sugar is less than 70 mg/dL, you will need to treat for low blood sugar: Do not take insulin. Treat a low blood sugar (less than 70 mg/dL) with  cup of clear juice (cranberry or apple), 4 glucose tablets, OR glucose gel. Recheck blood sugar in 15 minutes after treatment (to make sure it is greater than 70 mg/dL). If your blood sugar is not greater than 70 mg/dL on recheck, call [phone] for further instructions. Report your blood sugar to the short stay nurse when you get to Short Stay.  If you are admitted to the hospital after surgery: Your blood sugar will be checked by the staff and you will  probably be given insulin after surgery (instead of oral diabetes medicines) to make sure you have good blood sugar levels. The goal for blood sugar control after surgery is 80-180 mg/dL.   WHAT DO I DO ABOUT MY DIABETES MEDICATION?  Do not take oral diabetes medicines (pills) the morning of surgery.  THE DAY BEFORE SURGERY, take metformin as usual.      THE MORNING OF SURGERY, DO NOT TAKE ANY ORAL DIABETIC MEDICATIONS DAY OF YOUR SURGERY  DO NOT TAKE THE FOLLOWING 7 DAYS PRIOR TO SURGERY: Ozempic, Wegovy, Rybelsus (Semaglutide), Byetta (exenatide), Bydureon (exenatide ER), Victoza, Saxenda (liraglutide), or Trulicity (dulaglutide) Mounjaro (Tirzepatide) Adlyxin (Lixisenatide), Polyethylene Glycol Loxenatide.                              You may not have any metal on your body including hair pins, jewelry, and body piercing             Do not wear make-up, lotions, powders, perfumes/cologne, or deodorant  Do not wear nail polish including gel and S&S, artificial/acrylic nails, or any other type of covering on natural nails including finger and toenails. If you have artificial nails, gel coating, etc. that needs to be removed by a nail salon please have this removed prior to surgery or surgery may need to be canceled/ delayed if the surgeon/ anesthesia feels like they are unable to be safely monitored.   Do not shave  48 hours prior to surgery.    Do not bring valuables to the hospital. [place].   Contacts, dentures or bridgework may not be worn into surgery.   Bring small overnight bag day of surgery.   DO NOT [facility]  MEDICATIONS TO [place]. PHARMACY WILL DISPENSE MEDICATIONS LISTED ON YOUR MEDICATION LIST TO YOU DURING YOUR ADMISSION [place]!    Patients discharged on the day of surgery will not be allowed to drive home.  Someone NEEDS to stay with you for the first 24 hours after anesthesia.   Special  Instructions: Bring a copy of your healthcare power of attorney and living will documents         the day of surgery if you haven't scanned them before.              Please read over the following fact sheets you were given: IF YOU HAVE QUESTIONS ABOUT YOUR PRE-OP INSTRUCTIONS PLEASE CALL [phone]     [facility] - Preparing for Surgery Before surgery, you can play an important role.  Because skin is not sterile, your skin needs to be as free of germs as possible.  You can reduce the number of germs on your skin by washing with CHG (chlorahexidine gluconate) soap before surgery.  CHG is an antiseptic cleaner which kills germs and bonds with the skin to continue killing germs even after washing. Please DO NOT use if you have an allergy to CHG or antibacterial soaps.  If your skin becomes reddened/irritated stop using the CHG and inform your nurse when you arrive at Short Stay. Do not shave (including legs and underarms) for at least 48 hours prior to the first CHG shower.  You may shave your face/neck. Please follow these instructions carefully:  1.  Shower with CHG Soap the night before surgery and the  morning of Surgery.  2.  If you choose to wash your hair, wash your hair first as usual with your  normal  shampoo.  3.  After you shampoo, rinse your hair and body thoroughly to remove the  shampoo.                           4.  Use CHG as you would any other liquid soap.  You can apply chg directly  to the skin and wash                       Gently with a scrungie or clean washcloth.  5.  Apply the CHG Soap to your body ONLY FROM THE NECK DOWN.   Do not use on face/ open                           Wound or open sores. Avoid contact with eyes, ears mouth and genitals (private parts).                       Wash face,  Genitals (private parts) with your normal soap.             6.  Wash thoroughly, paying special attention to the area where your surgery  will be performed.  7.  Thoroughly rinse  your body with warm water from the neck down.  8.  DO NOT shower/wash with your normal soap after using and rinsing off  the CHG Soap.                9.  Pat yourself dry with a clean towel.            10.  Wear clean pajamas.  11.  Place clean sheets on your bed the night of your first shower and do not  sleep with pets. Day of Surgery : Do not apply any lotions/deodorants the morning of surgery.  Please wear clean clothes to the hospital/surgery center.  FAILURE TO FOLLOW THESE INSTRUCTIONS MAY RESULT IN THE CANCELLATION OF YOUR SURGERY PATIENT SIGNATURE_________________________________  NURSE SIGNATURE__________________________________  ________________________________________________________________________

## 2022-10-11 ENCOUNTER — Other Ambulatory Visit: Payer: Self-pay

## 2022-10-11 ENCOUNTER — Encounter (HOSPITAL_COMMUNITY)
Admission: RE | Admit: 2022-10-11 | Discharge: 2022-10-11 | Disposition: A | Discharge status: Home / Self Care | Payer: PPO | Source: Ambulatory Visit | Attending: Surgery | Admitting: Surgery

## 2022-10-11 ENCOUNTER — Encounter (HOSPITAL_COMMUNITY): Payer: Self-pay

## 2022-10-11 VITALS — BP 149/68 | HR 81 | Temp 98.5°F | Ht 61.5 in | Wt 212.0 lb

## 2022-10-11 DIAGNOSIS — Z01812 Encounter for preprocedural laboratory examination: Secondary | ICD-10-CM | POA: Insufficient documentation

## 2022-10-11 DIAGNOSIS — I10 Essential (primary) hypertension: Secondary | ICD-10-CM | POA: Diagnosis not present

## 2022-10-11 DIAGNOSIS — K828 Other specified diseases of gallbladder: Secondary | ICD-10-CM | POA: Diagnosis not present

## 2022-10-11 DIAGNOSIS — I447 Left bundle-branch block, unspecified: Secondary | ICD-10-CM | POA: Diagnosis not present

## 2022-10-11 DIAGNOSIS — E1136 Type 2 diabetes mellitus with diabetic cataract: Secondary | ICD-10-CM | POA: Diagnosis not present

## 2022-10-11 DIAGNOSIS — E119 Type 2 diabetes mellitus without complications: Secondary | ICD-10-CM

## 2022-10-11 HISTORY — DX: Anemia, unspecified: D64.9

## 2022-10-11 LAB — BASIC METABOLIC PANEL
Anion gap: 10 (ref 5–15)
BUN: 21 mg/dL (ref 8–23)
CO2: 24 mmol/L (ref 22–32)
Calcium: 9.8 mg/dL (ref 8.9–10.3)
Chloride: 105 mmol/L (ref 98–111)
Creatinine, Ser: 1.04 mg/dL — ABNORMAL HIGH (ref 0.44–1.00)
GFR, Estimated: 58 mL/min — ABNORMAL LOW (ref >60)
Glucose, Bld: 154 mg/dL — ABNORMAL HIGH (ref 70–99)
Potassium: 4.6 mmol/L (ref 3.5–5.1)
Sodium: 139 mmol/L (ref 135–145)

## 2022-10-11 LAB — CBC
HCT: 35.8 % — ABNORMAL LOW (ref 36.0–46.0)
Hemoglobin: 11 g/dL — ABNORMAL LOW (ref 12.0–15.0)
MCH: 25.5 pg — ABNORMAL LOW (ref 26.0–34.0)
MCHC: 30.7 g/dL (ref 30.0–36.0)
MCV: 83.1 fL (ref 80.0–100.0)
Platelets: 313 10*3/uL (ref 150–400)
RBC: 4.31 MIL/uL (ref 3.87–5.11)
RDW: 15.8 % — ABNORMAL HIGH (ref 11.5–15.5)
WBC: 11.8 10*3/uL — ABNORMAL HIGH (ref 4.0–10.5)
nRBC: 0 % (ref 0.0–0.2)

## 2022-10-11 LAB — HEMOGLOBIN A1C
Hgb A1c MFr Bld: 6.6 % — ABNORMAL HIGH (ref 4.8–5.6)
Mean Plasma Glucose: 142.72 mg/dL

## 2022-10-11 LAB — GLUCOSE, CAPILLARY: Glucose-Capillary: 164 mg/dL — ABNORMAL HIGH (ref 70–99)

## 2022-10-11 NOTE — Progress Notes (Signed)
For Short Stay: [place] appointment date: Date of COVID positive in last 92 days:  Bowel Prep reminder:   For Anesthesia: PCP - Dr. Maury Dus Cardiologist - Dr. Larae Grooms. LOV: 10/06/22. Clearance: Nicholes Rough: PA-C : 10/06/22: EPIC  Chest x-ray - 02/06/22 EKG - 07/12/22 Stress Test -  ECHO - 04/06/22 Cardiac Cath -  Pacemaker/ICD device last checked: Pacemaker orders received: Device Rep notified:  Spinal Cord Stimulator:  Sleep Study -  CPAP -   Fasting Blood Sugar - 140's Checks Blood Sugar ___1__ times a day Date and result of last Hgb A1c-  Blood Thinner Instructions: Aspirin Instructions: On hold for surgery. Last Dose:  Activity level: Can go up a flight of stairs and activities of daily living without stopping and without chest pain and/or shortness of breath   Able to exercise without chest pain and/or shortness of breath   Unable to go up a flight of stairs without chest pain and/or shortness of breath     Anesthesia review: Hx: DIA,HTN,Left BBB.  Patient denies shortness of breath, fever, cough and chest pain at PAT appointment   Patient verbalized understanding of instructions that were given to them at the PAT appointment. Patient was also instructed that they will need to review over the PAT instructions again at home before surgery.

## 2022-10-12 NOTE — Anesthesia Preprocedure Evaluation (Addendum)
Anesthesia Evaluation   Patient awake    Reviewed: Allergy & Precautions, Patient's Chart, lab work & pertinent test results  Airway Mallampati: II  TM Distance: >3 FB Neck ROM: Full    Dental no notable dental hx.    Pulmonary former smoker,    Pulmonary exam normal breath sounds clear to auscultation       Cardiovascular hypertension, Pt. on medications and Pt. on home beta blockers Normal cardiovascular exam+ dysrhythmias  Rhythm:Regular Rate:Normal  Echo: 1. Left ventricular ejection fraction, by estimation, is 45 to 50%. Left  ventricular ejection fraction by 3D volume is 49 %. The left ventricle has  mildly decreased function. The left ventricle demonstrates regional wall  motion abnormalities (septal  hypokinesis with dysschrony). There is mild concentric left ventricular  hypertrophy. Left ventricular diastolic parameters are consistent with  Grade II diastolic dysfunction (pseudonormalization). The average left  ventricular global longitudinal strain  is -12.6 %. The global longitudinal strain is abnormal.  2. Right ventricular systolic function is normal. The right ventricular  size is normal. There is normal pulmonary artery systolic pressure.  3. The mitral valve is abnormal. No evidence of mitral valve  regurgitation. No evidence of mitral stenosis.  4. The aortic valve was not well visualized. Aortic valve regurgitation  is not visualized. No aortic stenosis is present.  5. The inferior vena cava is normal in size with greater than 50%  respiratory variability, suggesting right atrial pressure of 3 mmHg.    Neuro/Psych negative neurological ROS  negative psych ROS   GI/Hepatic Neg liver ROS, GERD  Medicated,  Endo/Other  diabetes, Type 2, Oral Hypoglycemic Agents  Renal/GU negative Renal ROS     Musculoskeletal  (+) Arthritis ,   Abdominal   Peds  Hematology negative hematology ROS (+)    Anesthesia Other Findings   Reproductive/Obstetrics                          Anesthesia Physical Anesthesia Plan  ASA: 3  Anesthesia Plan: General   Post-op Pain Management:    Induction: Intravenous  PONV Risk Score and Plan: 4 or greater and Ondansetron, Dexamethasone, Midazolam and Scopolamine patch - Pre-op  Airway Management Planned: Oral ETT  Additional Equipment: None  Intra-op Plan:   Post-operative Plan: Extubation in OR  Informed Consent:   Plan Discussed with: CRNA  Anesthesia Plan Comments: (See PAT note 10/11/2022)       Anesthesia Quick Evaluation

## 2022-10-12 NOTE — Progress Notes (Signed)
Anesthesia Chart Review   Case: [id number] Date/Time: 10/16/22 1235   Procedure: XI ROBOTIC ASSISTED LAPAROSCOPIC CHOLECYSTECTOMY   Anesthesia type: General   Pre-op diagnosis: BILIARY DYSKINESIA   Location: [facility] ROOM 05 / [facility]   Surgeons: Clovis Riley, MD       DISCUSSION:70 y.o. former smoker with h/o HTN, DM II, LBBB, biliary dyskinesia scheduled for above procedure 10/16/2022 with Dr. Romana Juniper.   Pt last seen by cardiology 10/06/2022. Per OV note, "Ms. Rupnow's perioperative risk of a major cardiac event is 0.9% according to the Revised Cardiac Risk Index (RCRI).  Therefore, she is at low risk for perioperative complications.   Her functional capacity is good at 5.07 METs according to the Duke Activity Status Index (DASI). Recommendations: According to ACC/AHA guidelines, no further cardiovascular testing needed.  The patient may proceed to surgery at acceptable risk.   Antiplatelet and/or Anticoagulation Recommendations: Aspirin can be held for 7 days prior to her surgery.  Please resume Aspirin post operatively when it is felt to be safe from a bleeding standpoint."  Anticipate pt can proceed with planned procedure barring acute status change.   VS: BP (!) 149/68   Pulse 81   Temp 36.9 C (Oral)   Ht 5' 1.5" (1.562 m)   Wt 96.2 kg   SpO2 99%   BMI 39.41 kg/m   PROVIDERS: Maury Dus, MD is PCP    LABS: Labs reviewed: Acceptable for surgery. (all labs ordered are listed, but only abnormal results are displayed)  Labs Reviewed  HEMOGLOBIN A1C - Abnormal; Notable for the following components:      Result Value   Hgb A1c MFr Bld 6.6 (*)    All other components within normal limits  BASIC METABOLIC PANEL - Abnormal; Notable for the following components:   Glucose, Bld 154 (*)    Creatinine, Ser 1.04 (*)    GFR, Estimated 58 (*)    All other components within normal limits  CBC - Abnormal; Notable for the following components:   WBC 11.8 (*)    Hemoglobin  11.0 (*)    HCT 35.8 (*)    MCH 25.5 (*)    RDW 15.8 (*)    All other components within normal limits  GLUCOSE, CAPILLARY - Abnormal; Notable for the following components:   Glucose-Capillary 164 (*)    All other components within normal limits     IMAGES:   EKG:   CV: Echo 04/06/2022  1. Left ventricular ejection fraction, by estimation, is 45 to 50%. Left  ventricular ejection fraction by 3D volume is 49 %. The left ventricle has  mildly decreased function. The left ventricle demonstrates regional wall  motion abnormalities (septal  hypokinesis with dysschrony). There is mild concentric left ventricular  hypertrophy. Left ventricular diastolic parameters are consistent with  Grade II diastolic dysfunction (pseudonormalization). The average left  ventricular global longitudinal strain  is -12.6 %. The global longitudinal strain is abnormal.   2. Right ventricular systolic function is normal. The right ventricular  size is normal. There is normal pulmonary artery systolic pressure.   3. The mitral valve is abnormal. No evidence of mitral valve  regurgitation. No evidence of mitral stenosis.   4. The aortic valve was not well visualized. Aortic valve regurgitation  is not visualized. No aortic stenosis is present.   5. The inferior vena cava is normal in size with greater than 50%  respiratory variability, suggesting right atrial pressure of 3 mmHg.  Past Medical  History:  Diagnosis Date   Anemia    Diabetes mellitus without complication (HCC)    Type 2   Genital herpes    GERD (gastroesophageal reflux disease)    Hypertension    Interstitial cystitis    Left bundle branch block (LBBB)    chronic   Leukocytosis    Osteoarthritis    Plantar fasciitis     Past Surgical History:  Procedure Laterality Date   BACK SURGERY     CARPAL TUNNEL RELEASE Bilateral    CESAREAN SECTION     twice 1978 and [place]   EYE SURGERY Bilateral  2021   cataracts removed   SHOULDER SURGERY Right 2012   rotator cuff repair   TUBAL LIGATION  1987    MEDICATIONS:  acetaminophen (TYLENOL) 500 MG tablet   allopurinol (ZYLOPRIM) 300 MG tablet   amLODipine (NORVASC) 10 MG tablet   aspirin EC 81 MG tablet   celecoxib (CELEBREX) 200 MG capsule   cetirizine (ZYRTEC) 10 MG tablet   colchicine 0.6 MG tablet   hydrochlorothiazide (MICROZIDE) 12.5 MG capsule   irbesartan (AVAPRO) 300 MG tablet   metFORMIN (GLUCOPHAGE) 1000 MG tablet   metoprolol tartrate (LOPRESSOR) 25 MG tablet   Multiple Vitamin (MULTIVITAMIN WITH MINERALS) TABS tablet   Multiple Vitamins-Minerals (EMERGEN-C IMMUNE PLUS) PACK   nystatin cream (MYCOSTATIN)   omeprazole (PRILOSEC) 40 MG capsule   Pentosan Polysulfate Sodium 200 MG CPDR   polyvinyl alcohol (LIQUIFILM TEARS) 1.4 % ophthalmic solution   pravastatin (PRAVACHOL) 40 MG tablet   sucralfate (CARAFATE) 1 g tablet   trimethoprim (TRIMPEX) 100 MG tablet   valACYclovir (VALTREX) 500 MG tablet   vitamin B-12 (CYANOCOBALAMIN) 1000 MCG tablet   No current facility-administered medications for this encounter.   Konrad Felix Ward, PA-C [facility] [phone]

## 2022-10-16 ENCOUNTER — Other Ambulatory Visit: Payer: Self-pay

## 2022-10-16 ENCOUNTER — Ambulatory Visit (HOSPITAL_BASED_OUTPATIENT_CLINIC_OR_DEPARTMENT_OTHER): Payer: PPO | Admitting: Certified Registered Nurse Anesthetist

## 2022-10-16 ENCOUNTER — Encounter (HOSPITAL_COMMUNITY)
Admission: RE | Disposition: A | Discharge status: Home / Self Care | Payer: Self-pay | Source: Home / Self Care | Attending: Surgery

## 2022-10-16 ENCOUNTER — Encounter (HOSPITAL_COMMUNITY): Payer: Self-pay | Admitting: Surgery

## 2022-10-16 ENCOUNTER — Ambulatory Visit (HOSPITAL_COMMUNITY)
Admission: RE | Admit: 2022-10-16 | Discharge: 2022-10-16 | Disposition: A | Discharge status: Home / Self Care | Payer: PPO | Attending: Surgery | Admitting: Surgery

## 2022-10-16 ENCOUNTER — Ambulatory Visit (HOSPITAL_COMMUNITY): Payer: PPO | Admitting: Physician Assistant

## 2022-10-16 DIAGNOSIS — I10 Essential (primary) hypertension: Secondary | ICD-10-CM | POA: Insufficient documentation

## 2022-10-16 DIAGNOSIS — N3010 Interstitial cystitis (chronic) without hematuria: Secondary | ICD-10-CM | POA: Insufficient documentation

## 2022-10-16 DIAGNOSIS — Z87891 Personal history of nicotine dependence: Secondary | ICD-10-CM | POA: Diagnosis not present

## 2022-10-16 DIAGNOSIS — K828 Other specified diseases of gallbladder: Secondary | ICD-10-CM | POA: Insufficient documentation

## 2022-10-16 DIAGNOSIS — E669 Obesity, unspecified: Secondary | ICD-10-CM | POA: Diagnosis not present

## 2022-10-16 DIAGNOSIS — Z79899 Other long term (current) drug therapy: Secondary | ICD-10-CM | POA: Insufficient documentation

## 2022-10-16 DIAGNOSIS — K811 Chronic cholecystitis: Secondary | ICD-10-CM

## 2022-10-16 DIAGNOSIS — M1990 Unspecified osteoarthritis, unspecified site: Secondary | ICD-10-CM | POA: Insufficient documentation

## 2022-10-16 DIAGNOSIS — K8010 Calculus of gallbladder with chronic cholecystitis without obstruction: Secondary | ICD-10-CM | POA: Diagnosis not present

## 2022-10-16 DIAGNOSIS — Z6839 Body mass index (BMI) 39.0-39.9, adult: Secondary | ICD-10-CM | POA: Insufficient documentation

## 2022-10-16 DIAGNOSIS — Z7984 Long term (current) use of oral hypoglycemic drugs: Secondary | ICD-10-CM | POA: Insufficient documentation

## 2022-10-16 DIAGNOSIS — K219 Gastro-esophageal reflux disease without esophagitis: Secondary | ICD-10-CM | POA: Diagnosis not present

## 2022-10-16 DIAGNOSIS — E119 Type 2 diabetes mellitus without complications: Secondary | ICD-10-CM | POA: Insufficient documentation

## 2022-10-16 LAB — GLUCOSE, CAPILLARY
Glucose-Capillary: 141 mg/dL — ABNORMAL HIGH (ref 70–99)
Glucose-Capillary: 213 mg/dL — ABNORMAL HIGH (ref 70–99)

## 2022-10-16 SURGERY — CHOLECYSTECTOMY, ROBOT-ASSISTED, LAPAROSCOPIC
Anesthesia: General | Site: Abdomen

## 2022-10-16 MED ORDER — BUPIVACAINE LIPOSOME 1.3 % IJ SUSP: 20.0000 mL | Freq: Once | INTRAMUSCULAR | Status: DC

## 2022-10-16 MED ORDER — FENTANYL CITRATE PF 50 MCG/ML IJ SOSY: 25.0000 ug | PREFILLED_SYRINGE | INTRAMUSCULAR | Status: DC | PRN

## 2022-10-16 MED ORDER — LIDOCAINE HCL (PF) 2 % IJ SOLN
INTRAMUSCULAR | Status: AC
  Filled 2022-10-16: qty 5

## 2022-10-16 MED ORDER — ONDANSETRON HCL 4 MG/2ML IJ SOLN
INTRAMUSCULAR | Status: DC | PRN
  Administered 2022-10-16: 4 mg via INTRAVENOUS

## 2022-10-16 MED ORDER — AMISULPRIDE (ANTIEMETIC) 5 MG/2ML IV SOLN
10.0000 mg | Freq: Once | INTRAVENOUS | Status: AC | PRN
  Administered 2022-10-16: 10 mg via INTRAVENOUS

## 2022-10-16 MED ORDER — PROPOFOL 10 MG/ML IV BOLUS
INTRAVENOUS | Status: DC | PRN
  Administered 2022-10-16: 150 mg via INTRAVENOUS

## 2022-10-16 MED ORDER — FENTANYL CITRATE (PF) 250 MCG/5ML IJ SOLN
INTRAMUSCULAR | Status: DC | PRN
  Administered 2022-10-16: 50 ug via INTRAVENOUS
  Administered 2022-10-16: 100 ug via INTRAVENOUS
  Administered 2022-10-16 (×2): 25 ug via INTRAVENOUS

## 2022-10-16 MED ORDER — ACETAMINOPHEN 500 MG PO TABS
1000.0000 mg | ORAL_TABLET | ORAL | Status: AC
  Administered 2022-10-16: 1000 mg via ORAL
  Filled 2022-10-16: qty 2

## 2022-10-16 MED ORDER — GABAPENTIN 300 MG PO CAPS
300.0000 mg | ORAL_CAPSULE | ORAL | Status: AC
  Administered 2022-10-16: 300 mg via ORAL
  Filled 2022-10-16: qty 1

## 2022-10-16 MED ORDER — HYDROMORPHONE HCL 1 MG/ML IJ SOLN: 0.2500 mg | INTRAMUSCULAR | Status: DC | PRN

## 2022-10-16 MED ORDER — BUPIVACAINE-EPINEPHRINE 0.25% -1:200000 IJ SOLN
INTRAMUSCULAR | Status: DC | PRN
  Administered 2022-10-16: 30 mL

## 2022-10-16 MED ORDER — BUPIVACAINE-EPINEPHRINE (PF) 0.25% -1:200000 IJ SOLN
INTRAMUSCULAR | Status: AC
  Filled 2022-10-16: qty 30

## 2022-10-16 MED ORDER — CHLORHEXIDINE GLUCONATE 0.12 % MT SOLN
15.0000 mL | Freq: Once | OROMUCOSAL | Status: AC
  Administered 2022-10-16: 15 mL via OROMUCOSAL

## 2022-10-16 MED ORDER — MIDAZOLAM HCL 2 MG/2ML IJ SOLN
INTRAMUSCULAR | Status: AC
  Filled 2022-10-16: qty 2

## 2022-10-16 MED ORDER — CHLORHEXIDINE GLUCONATE 4 % EX LIQD: 60.0000 mL | Freq: Once | CUTANEOUS | Status: DC

## 2022-10-16 MED ORDER — SODIUM CHLORIDE 0.9% FLUSH: 3.0000 mL | Freq: Two times a day (BID) | INTRAVENOUS | Status: DC

## 2022-10-16 MED ORDER — MEPERIDINE HCL 50 MG/ML IJ SOLN: 6.2500 mg | INTRAMUSCULAR | Status: DC | PRN

## 2022-10-16 MED ORDER — PROPOFOL 10 MG/ML IV BOLUS
INTRAVENOUS | Status: AC
  Filled 2022-10-16: qty 20

## 2022-10-16 MED ORDER — BUPIVACAINE LIPOSOME 1.3 % IJ SUSP
INTRAMUSCULAR | Status: AC
  Filled 2022-10-16: qty 20

## 2022-10-16 MED ORDER — LIDOCAINE 2% (20 MG/ML) 5 ML SYRINGE
INTRAMUSCULAR | Status: DC | PRN
  Administered 2022-10-16: 100 mg via INTRAVENOUS
  Administered 2022-10-16: 1.5 mg/kg/h via INTRAVENOUS

## 2022-10-16 MED ORDER — INDOCYANINE GREEN 25 MG IV SOLR
7.5000 mg | Freq: Once | INTRAVENOUS | Status: AC
  Administered 2022-10-16: 7.5 mg via INTRAVENOUS
  Filled 2022-10-16: qty 3

## 2022-10-16 MED ORDER — DEXAMETHASONE SODIUM PHOSPHATE 10 MG/ML IJ SOLN
INTRAMUSCULAR | Status: DC | PRN
  Administered 2022-10-16: 10 mg via INTRAVENOUS

## 2022-10-16 MED ORDER — OXYCODONE HCL 5 MG/5ML PO SOLN: 5.0000 mg | Freq: Once | ORAL | Status: DC | PRN

## 2022-10-16 MED ORDER — OXYCODONE HCL 5 MG PO TABS: 5.0000 mg | ORAL_TABLET | Freq: Once | ORAL | Status: DC | PRN

## 2022-10-16 MED ORDER — NALOXONE HCL 0.4 MG/ML IJ SOLN
INTRAMUSCULAR | Status: AC
  Filled 2022-10-16: qty 1

## 2022-10-16 MED ORDER — LIDOCAINE HCL (PF) 2 % IJ SOLN
INTRAMUSCULAR | Status: AC
  Filled 2022-10-16: qty 10

## 2022-10-16 MED ORDER — SUGAMMADEX SODIUM 200 MG/2ML IV SOLN
INTRAVENOUS | Status: DC | PRN
  Administered 2022-10-16: 400 mg via INTRAVENOUS

## 2022-10-16 MED ORDER — MIDAZOLAM HCL 5 MG/5ML IJ SOLN
INTRAMUSCULAR | Status: DC | PRN
  Administered 2022-10-16: 2 mg via INTRAVENOUS

## 2022-10-16 MED ORDER — ACETAMINOPHEN 650 MG RE SUPP: 650.0000 mg | RECTAL | Status: DC | PRN

## 2022-10-16 MED ORDER — OXYCODONE HCL 5 MG PO TABS: 5.0000 mg | ORAL_TABLET | ORAL | Status: DC | PRN

## 2022-10-16 MED ORDER — DEXAMETHASONE SODIUM PHOSPHATE 10 MG/ML IJ SOLN
INTRAMUSCULAR | Status: AC
  Filled 2022-10-16: qty 1

## 2022-10-16 MED ORDER — SODIUM CHLORIDE 0.9% FLUSH: 3.0000 mL | INTRAVENOUS | Status: DC | PRN

## 2022-10-16 MED ORDER — ORAL CARE MOUTH RINSE: 15.0000 mL | Freq: Once | OROMUCOSAL | Status: AC

## 2022-10-16 MED ORDER — ROCURONIUM BROMIDE 10 MG/ML (PF) SYRINGE
PREFILLED_SYRINGE | INTRAVENOUS | Status: AC
  Filled 2022-10-16: qty 10

## 2022-10-16 MED ORDER — SUGAMMADEX SODIUM 500 MG/5ML IV SOLN
INTRAVENOUS | Status: AC
  Filled 2022-10-16: qty 5

## 2022-10-16 MED ORDER — KETAMINE HCL 50 MG/5ML IJ SOSY
PREFILLED_SYRINGE | INTRAMUSCULAR | Status: AC
  Filled 2022-10-16: qty 5

## 2022-10-16 MED ORDER — KETAMINE HCL 10 MG/ML IJ SOLN
INTRAMUSCULAR | Status: DC | PRN
  Administered 2022-10-16: 20 mg via INTRAVENOUS
  Administered 2022-10-16: 30 mg via INTRAVENOUS

## 2022-10-16 MED ORDER — FENTANYL CITRATE (PF) 250 MCG/5ML IJ SOLN
INTRAMUSCULAR | Status: AC
  Filled 2022-10-16: qty 5

## 2022-10-16 MED ORDER — ACETAMINOPHEN 325 MG PO TABS: 650.0000 mg | ORAL_TABLET | ORAL | Status: DC | PRN

## 2022-10-16 MED ORDER — AMISULPRIDE (ANTIEMETIC) 5 MG/2ML IV SOLN: 10.0000 mg | Freq: Once | INTRAVENOUS | Status: DC

## 2022-10-16 MED ORDER — SODIUM CHLORIDE 0.9 % IV SOLN: 250.0000 mL | INTRAVENOUS | Status: DC | PRN

## 2022-10-16 MED ORDER — ONDANSETRON HCL 4 MG/2ML IJ SOLN
INTRAMUSCULAR | Status: AC
  Filled 2022-10-16: qty 2

## 2022-10-16 MED ORDER — ROCURONIUM BROMIDE 10 MG/ML (PF) SYRINGE
PREFILLED_SYRINGE | INTRAVENOUS | Status: DC | PRN
  Administered 2022-10-16: 100 mg via INTRAVENOUS

## 2022-10-16 MED ORDER — PROMETHAZINE HCL 25 MG/ML IJ SOLN: 6.2500 mg | INTRAMUSCULAR | Status: DC | PRN

## 2022-10-16 MED ORDER — CEFAZOLIN SODIUM-DEXTROSE 2-4 GM/100ML-% IV SOLN
2.0000 g | INTRAVENOUS | Status: AC
  Administered 2022-10-16: 2 g via INTRAVENOUS
  Filled 2022-10-16: qty 100

## 2022-10-16 MED ORDER — 0.9 % SODIUM CHLORIDE (POUR BTL) OPTIME
TOPICAL | Status: DC | PRN
  Administered 2022-10-16: 1000 mL

## 2022-10-16 MED ORDER — LACTATED RINGERS IV SOLN: INTRAVENOUS | Status: DC

## 2022-10-16 MED ORDER — TRAMADOL HCL 50 MG PO TABS: 50.0000 mg | ORAL_TABLET | Freq: Four times a day (QID) | ORAL | 0 refills | Status: AC | PRN

## 2022-10-16 MED ORDER — DOCUSATE SODIUM 100 MG PO CAPS: 100.0000 mg | ORAL_CAPSULE | Freq: Two times a day (BID) | ORAL | 0 refills | Status: AC

## 2022-10-16 MED ORDER — BUPIVACAINE LIPOSOME 1.3 % IJ SUSP
INTRAMUSCULAR | Status: DC | PRN
  Administered 2022-10-16: 20 mL

## 2022-10-16 MED ORDER — AMISULPRIDE (ANTIEMETIC) 5 MG/2ML IV SOLN
INTRAVENOUS | Status: AC
  Filled 2022-10-16: qty 4

## 2022-10-16 SURGICAL SUPPLY — 65 items
ADH SKN CLS APL DERMABOND .7 (GAUZE/BANDAGES/DRESSINGS) ×1
APL PRP STRL LF DISP 70% ISPRP (MISCELLANEOUS) ×1
APPLIER CLIP 5 13 M/L LIGAMAX5 (MISCELLANEOUS)
APR CLP MED LRG 5 ANG JAW (MISCELLANEOUS)
BAG COUNTER SPONGE SURGICOUNT (BAG) ×2 IMPLANT
BAG SPEC RTRVL 10 TROC 200 (ENDOMECHANICALS) ×1
BAG SPNG CNTER NS LX DISP (BAG) ×1
BLADE SURG SZ11 CARB STEEL (BLADE) ×2 IMPLANT
CANNULA REDUC XI 12-8 STAPL (CANNULA) ×1
CANNULA REDUCER 12-8 DVNC XI (CANNULA) ×2 IMPLANT
CHLORAPREP W/TINT 26 (MISCELLANEOUS) ×2 IMPLANT
CLIP APPLIE 5 13 M/L LIGAMAX5 (MISCELLANEOUS) IMPLANT
CLIP LIGATING HEM O LOK PURPLE (MISCELLANEOUS) ×2 IMPLANT
COVER TIP SHEARS 8 DVNC (MISCELLANEOUS) ×2 IMPLANT
COVER TIP SHEARS 8MM DA VINCI (MISCELLANEOUS) ×1
DERMABOND ADVANCED .7 DNX12 (GAUZE/BANDAGES/DRESSINGS) IMPLANT
DRAPE ARM DVNC X/XI (DISPOSABLE) ×8 IMPLANT
DRAPE COLUMN DVNC XI (DISPOSABLE) ×2 IMPLANT
DRAPE DA VINCI XI ARM (DISPOSABLE) ×4
DRAPE DA VINCI XI COLUMN (DISPOSABLE) ×1
DRSG TEGADERM 2-3/8X2-3/4 SM (GAUZE/BANDAGES/DRESSINGS) ×6 IMPLANT
DRSG TEGADERM 4X4.75 (GAUZE/BANDAGES/DRESSINGS) ×2 IMPLANT
ELECT REM PT RETURN 15FT ADLT (MISCELLANEOUS) ×2 IMPLANT
GAUZE SPONGE 2X2 8PLY STRL LF (GAUZE/BANDAGES/DRESSINGS) ×2 IMPLANT
GLOVE BIO SURGEON STRL SZ7.5 (GLOVE) ×2 IMPLANT
GLOVE INDICATOR 8.0 STRL GRN (GLOVE) ×2 IMPLANT
GOWN STRL REUS W/ TWL XL LVL3 (GOWN DISPOSABLE) ×4 IMPLANT
GOWN STRL REUS W/TWL XL LVL3 (GOWN DISPOSABLE) ×2
GRASPER SUT TROCAR 14GX15 (MISCELLANEOUS) IMPLANT
IRRIG SUCT STRYKERFLOW 2 WTIP (MISCELLANEOUS)
IRRIGATION SUCT STRKRFLW 2 WTP (MISCELLANEOUS) IMPLANT
IRRIGATOR SUCT 8 DISP DVNC XI (IRRIGATION / IRRIGATOR) IMPLANT
IRRIGATOR SUCTION 8MM XI DISP (IRRIGATION / IRRIGATOR)
KIT BASIN OR (CUSTOM PROCEDURE TRAY) ×2 IMPLANT
KIT PROCEDURE DA VINCI SI (MISCELLANEOUS)
KIT PROCEDURE DVNC SI (MISCELLANEOUS) IMPLANT
KIT TURNOVER KIT A (KITS) IMPLANT
MANIFOLD NEPTUNE II (INSTRUMENTS) ×2 IMPLANT
NDL INSUFFLATION 14GA 120MM (NEEDLE) IMPLANT
NEEDLE HYPO 22GX1.5 SAFETY (NEEDLE) ×2 IMPLANT
NEEDLE INSUFFLATION 14GA 120MM (NEEDLE) IMPLANT
PACK CARDIOVASCULAR III (CUSTOM PROCEDURE TRAY) ×2 IMPLANT
POUCH RETRIEVAL ECOSAC 10 (ENDOMECHANICALS) ×2 IMPLANT
POUCH RETRIEVAL ECOSAC 10MM (ENDOMECHANICALS) ×1
SEAL CANN UNIV 5-8 DVNC XI (MISCELLANEOUS) ×8 IMPLANT
SEAL XI 5MM-8MM UNIVERSAL (MISCELLANEOUS) ×4
SOL ANTI FOG 6CC (MISCELLANEOUS) ×2 IMPLANT
SOLUTION ANTI FOG 6CC (MISCELLANEOUS) ×1
SOLUTION ELECTROLUBE (MISCELLANEOUS) ×2 IMPLANT
SPIKE FLUID TRANSFER (MISCELLANEOUS) ×2 IMPLANT
STAPLER CANNULA SEAL DVNC XI (STAPLE) IMPLANT
STAPLER CANNULA SEAL XI (STAPLE)
STRIP CLOSURE SKIN 1/2X4 (GAUZE/BANDAGES/DRESSINGS) ×2 IMPLANT
SUT MNCRL AB 4-0 PS2 18 (SUTURE) ×2 IMPLANT
SUT VICRYL 0 TIES 12 18 (SUTURE) IMPLANT
SUT VICRYL 0 UR6 27IN ABS (SUTURE) IMPLANT
SYR 10ML ECCENTRIC (SYRINGE) ×2 IMPLANT
SYR 20ML LL LF (SYRINGE) ×2 IMPLANT
SYS RETRIEVAL 5MM INZII UNIV (BASKET) ×1
SYSTEM RETRIEVL 5MM INZII UNIV (BASKET) IMPLANT
TOWEL OR 17X26 10 PK STRL BLUE (TOWEL DISPOSABLE) ×2 IMPLANT
TOWEL OR NON WOVEN STRL DISP B (DISPOSABLE) ×2 IMPLANT
TRAY FOLEY MTR SLVR 16FR STAT (SET/KITS/TRAYS/PACK) IMPLANT
TROCAR Z-THREAD OPTICAL 5X100M (TROCAR) IMPLANT
TUBING INSUFFLATION 10FT LAP (TUBING) ×2 IMPLANT

## 2022-10-16 NOTE — Anesthesia Procedure Notes (Addendum)
Procedure Name: Intubation Date/Time: 10/16/2022 10:18 AM  Performed by: Maxwell Caul, CRNAPre-anesthesia Checklist: Patient identified, Emergency Drugs available, Suction available and Patient being monitored Patient Re-evaluated:Patient Re-evaluated prior to induction Oxygen Delivery Method: Circle system utilized Preoxygenation: Pre-oxygenation with 100% oxygen Induction Type: IV induction Ventilation: Mask ventilation without difficulty Laryngoscope Size: Mac and 4 Grade View: Grade I Tube type: Oral Tube size: 7.5 mm Number of attempts: 1 Airway Equipment and Method: Stylet Placement Confirmation: ETT inserted through vocal cords under direct vision, positive ETCO2 and breath sounds checked- equal and bilateral Secured at: 22 cm Tube secured with: Tape Dental Injury: Teeth and Oropharynx as per pre-operative assessment

## 2022-10-16 NOTE — Transfer of Care (Signed)
Immediate Anesthesia Transfer of Care Note  Patient: CAMBRYN CHARTERS  Procedure(s) Performed: XI ROBOTIC ASSISTED LAPAROSCOPIC CHOLECYSTECTOMY (Abdomen)  Patient Location: PACU  Anesthesia Type:General  Level of Consciousness: awake, alert  and oriented  Airway & Oxygen Therapy: Patient Spontanous Breathing and Patient connected to face mask oxygen  Post-op Assessment: Report given to RN  Post vital signs: Reviewed  Last Vitals:  Vitals Value Taken Time  BP 162/72 10/16/22 1132  Temp    Pulse 83 10/16/22 1136  Resp 14 10/16/22 1136  SpO2 99 % 10/16/22 1136  Vitals shown include unvalidated device data.  Last Pain:  Vitals:   10/16/22 0817  TempSrc:   PainSc: 0-No pain         Complications: No notable events documented.

## 2022-10-16 NOTE — Op Note (Signed)
Operative Note  GROVER WOODFIELD 71 y.o. female [id number]  10/16/2022  Surgeon: Clovis Riley MD FACS  Procedure performed: Robotic Cholecystectomy  Preop diagnosis: biliary dyskinesia Post-op diagnosis/intraop findings: same, chronic cholecystitis  Specimens: gallbladder  Retained items: none  EBL: minimal  Complications: none  Description of procedure: After confiming informed consent the patient was brought to the operating room. Antibiotics were administered. SCD's were applied. General endotracheal anesthesia was initiated and a formal time-out was performed. The abdomen was prepped and draped in the usual sterile fashion and the abdomen was entered using  left subcostal veress needle  after instilling the site with local. Insufflation to 380mHg was obtained, 837msupraumbilical trocar and camera inserted, and gross inspection revealed no evidence of injury from our entry or other intraabdominal abnormalities.  The patient was then placed in reverse Trendelenburg and rotated to the left.  Bilateral laparoscopic assisted taps blocks were performed with Exparel mixed with quarter percent Marcaine with epinephrine.  Three 80m61mrocars were introduced under direct visualization.  The robot was docked and instruments were then inserted under direct visualization.  Fairly dense omental adhesions to the gallbladder were taken down with cautery and blunt dissection until the fundus and infundibulum were able to be visualized and retracted.  The gallbladder fundus was retracted cephalad and the infundibulum was retracted laterally. A combination of hook electrocautery and blunt dissection was utilized to clear the peritoneum from the neck and cystic duct, circumferentially isolating the cystic artery and cystic duct and lifting the gallbladder from the cystic plate. The critical view of safety was achieved with the cystic artery, cystic duct, and liver bed visualized between them with no other  structures.  This was concordant with the view on firefly.  The artery was imitative and was divided with cautery.  The cystic duct was ligated with three clips on the proximal end and 1 on the distal/specimen end and was then divided. The gallbladder was dissected from the liver plate using electrocautery. Once freed the gallbladder was placed in a specimen bag and removed intact through the left upper quadrant trocar site.  The liver bed and right upper quadrant were again closely inspected; hemostasis was once again confirmed, and reinspection of the abdomen revealed no injuries. The clips were well opposed without any bile leak from the duct or the liver bed.  The robotic instruments were removed and the robot undocked; the abdomen was desufflated and all trocars removed. The skin incisions were closed with subcuticular 4-0 monocryl and Dermabond. The patient was awakened, extubated and transported to the recovery room in stable condition.    All counts were correct at the completion of the case.

## 2022-10-16 NOTE — Anesthesia Postprocedure Evaluation (Signed)
Anesthesia Post Note  Patient: PHYLLIS ABELSON  Procedure(s) Performed: XI ROBOTIC ASSISTED LAPAROSCOPIC CHOLECYSTECTOMY (Abdomen)     Patient location during evaluation: PACU Anesthesia Type: General Level of consciousness: awake and alert Pain management: pain level controlled Vital Signs Assessment: post-procedure vital signs reviewed and stable Respiratory status: spontaneous breathing, nonlabored ventilation and respiratory function stable Cardiovascular status: blood pressure returned to baseline and stable Postop Assessment: no apparent nausea or vomiting Anesthetic complications: no   No notable events documented.  Last Vitals:  Vitals:   10/16/22 1230 10/16/22 1245  BP: (!) 145/69 (!) 140/83  Pulse: 80 88  Resp: 15 14  Temp:  36.7 C  SpO2: 94% 97%    Last Pain:  Vitals:   10/16/22 1245  TempSrc:   PainSc: 0-No pain                 Lynda Rainwater

## 2022-10-16 NOTE — Interval H&P Note (Signed)
History and Physical Interval Note:  10/16/2022 8:59 AM  Diane Mayo  has presented today for surgery, with the diagnosis of BILIARY DYSKINESIA.  The various methods of treatment have been discussed with the patient and family. After consideration of risks, benefits and other options for treatment, the patient has consented to  Procedure(s): XI ROBOTIC [place] (N/A) as a surgical intervention.  The patient's history has been reviewed, patient examined, no change in status, stable for surgery.  I have reviewed the patient's chart and labs.  Questions were answered to the patient's satisfaction.     [name]

## 2022-10-16 NOTE — Discharge Instructions (Signed)
LAPAROSCOPIC SURGERY: POST OP INSTRUCTIONS   EAT Gradually transition to a high fiber diet with a fiber supplement over the next few weeks after discharge.  Start with a pureed / full liquid diet (see below)  WALK Walk an hour a day (cumulative- not all at once).  Control your pain to do that.    CONTROL PAIN Control pain so that you can walk, sleep, tolerate sneezing/coughing, go up/down stairs.  HAVE A BOWEL MOVEMENT DAILY Keep your bowels regular to avoid problems.  OK to try a laxative to override constipation.  OK to use an antidairrheal to slow down diarrhea.  Call if not better after 2 tries  CALL IF YOU HAVE PROBLEMS/CONCERNS Call if you are still struggling despite following these instructions. Call if you have concerns not answered by these instructions    DIET: Follow a light bland diet & liquids the first 24 hours after arrival home, such as soup, liquids, starches, etc.  Be sure to drink plenty of fluids.  Quickly advance to a usual solid diet within a few days.  Avoid fast food or heavy meals as your are more likely to get nauseated or have irregular bowels.  A low-sugar, high-fiber diet for the rest of your life is ideal.  Take your usually prescribed home medications unless otherwise directed.  PAIN CONTROL: Pain is best controlled by a usual combination of three different methods TOGETHER: Ice/Heat Over the counter pain medication Prescription pain medication Most patients will experience some swelling and bruising around the incisions.  Ice packs or heating pads (30-60 minutes up to 6 times a day) will help. Use ice for the first few days to help decrease swelling and bruising, then switch to heat to help relax tight/sore spots and speed recovery.  Some people prefer to use ice alone, heat alone, alternating between ice & heat.  Experiment to what works for you.  Swelling and bruising can take several weeks to resolve.   It is helpful to take an over-the-counter pain  medication regularly for the first few days: Naproxen (Aleve, etc)  Two 220mg tabs twice a day OR Ibuprofen (Advil, etc) Three 200mg tabs four times a day (every meal & bedtime) AND Acetaminophen (Tylenol, etc) 500-650mg four times a day (every meal & bedtime) A  prescription for pain medication (such as oxycodone, hydrocodone, tramadol, gabapentin, methocarbamol, etc) should be given to you upon discharge.  Take your pain medication as prescribed, IF NEEDED.  If you are having problems/concerns with the prescription medicine (does not control pain, nausea, vomiting, rash, itching, etc), please call us [phone] to see if we need to switch you to a different pain medicine that will work better for you and/or control your side effect better. If you need a refill on your pain medication, please give us 48 hour notice.  contact your pharmacy.  They will contact our office to request authorization. Prescriptions will not be filled after 5 pm or on week-ends  Avoid getting constipated.   Between the surgery and the pain medications, it is common to experience some constipation.   Increasing fluid intake and taking a fiber supplement (such as Metamucil, Citrucel, FiberCon, MiraLax, etc) 1-2 times a day regularly will usually help prevent this problem from occurring.   A mild laxative (prune juice, Milk of Magnesia, MiraLax, etc) should be taken according to package directions if there are no bowel movements after 48 hours.   Watch out for diarrhea.   If you have many loose   bowel movements, simplify your diet to bland foods & liquids for a few days.   Stop any stool softeners and decrease your fiber supplement.   Switching to mild anti-diarrheal medications (Kayopectate, Pepto Bismol) can help.   If this worsens or does not improve, please call us.  Wash / shower every day.  You may shower over the skin glue which is waterproof  Glue will flake off after about 2 weeks.  You may leave the incision  open to air.  You may replace a dressing/Band-Aid to cover the incision for comfort if you wish.   ACTIVITIES as tolerated:   You may resume regular (light) daily activities beginning the next day--such as daily self-care, walking, climbing stairs--gradually increasing activities as tolerated.  If you can walk 30 minutes without difficulty, it is safe to try more intense activity such as jogging, treadmill, bicycling, low-impact aerobics, swimming, etc. Save the most intensive and strenuous activity for last such as sit-ups, heavy lifting, contact sports, etc  Refrain from any heavy lifting or straining until you are off narcotics for pain control.   DO NOT PUSH THROUGH PAIN.  Let pain be your guide: If it hurts to do something, don't do it.  Pain is your body warning you to avoid that activity for another week until the pain goes down. You may drive when you are no longer taking prescription pain medication, you can comfortably wear a seatbelt, and you can safely maneuver your car and apply brakes. You may have sexual intercourse when it is comfortable.  FOLLOW UP in our office Please call [facility] at [phone] to set up an appointment to see your surgeon in the office for a follow-up appointment approximately 2-3 weeks after your surgery. Make sure that you call for this appointment the day you arrive home to insure a convenient appointment time.  10. IF YOU HAVE DISABILITY OR FAMILY LEAVE FORMS, BRING THEM TO THE OFFICE FOR PROCESSING.  DO NOT GIVE THEM TO YOUR DOCTOR.   WHEN TO CALL US [phone]: Poor pain control Reactions / problems with new medications (rash/itching, nausea, etc)  Fever over 101.5 F (38.5 C) Inability to urinate Nausea and/or vomiting Worsening swelling or bruising Continued bleeding from incision. Increased pain, redness, or drainage from the incision   The clinic staff is available to answer your questions during regular business hours (8:30am-5pm).  Please  don't hesitate to call and ask to speak to one of our nurses for clinical concerns.   If you have a medical emergency, go to the nearest emergency room or call 911.  A surgeon from [facility] is always on call at the hospitals   [facility] [address], [address], [place], [place]  [address] ? MAIN: [phone] ? TOLL FREE: [phone] ?  FAX [phone] [email or web address]  

## 2022-10-16 NOTE — Anesthesia Postprocedure Evaluation (Signed)
Anesthesia Post Note  Patient: Diane Mayo  Procedure(s) Performed: XI ROBOTIC ASSISTED LAPAROSCOPIC CHOLECYSTECTOMY (Abdomen)     Patient location during evaluation: PACU Anesthesia Type: General Level of consciousness: awake and alert Pain management: pain level controlled Vital Signs Assessment: post-procedure vital signs reviewed and stable Respiratory status: spontaneous breathing, nonlabored ventilation and respiratory function stable Cardiovascular status: blood pressure returned to baseline and stable Postop Assessment: no apparent nausea or vomiting Anesthetic complications: no   No notable events documented.  Last Vitals:  Vitals:   10/16/22 1230 10/16/22 1245  BP: (!) 145/69 (!) 140/83  Pulse: 80 88  Resp: 15 14  Temp:  36.7 C  SpO2: 94% 97%    Last Pain:  Vitals:   10/16/22 1245  TempSrc:   PainSc: 0-No pain                 Lynda Rainwater

## 2022-10-17 DIAGNOSIS — I10 Essential (primary) hypertension: Secondary | ICD-10-CM | POA: Diagnosis not present

## 2022-10-17 DIAGNOSIS — K219 Gastro-esophageal reflux disease without esophagitis: Secondary | ICD-10-CM | POA: Diagnosis not present

## 2022-10-17 DIAGNOSIS — E1169 Type 2 diabetes mellitus with other specified complication: Secondary | ICD-10-CM | POA: Diagnosis not present

## 2022-10-17 DIAGNOSIS — E782 Mixed hyperlipidemia: Secondary | ICD-10-CM | POA: Diagnosis not present

## 2022-10-17 LAB — SURGICAL PATHOLOGY

## 2022-10-25 DIAGNOSIS — K259 Gastric ulcer, unspecified as acute or chronic, without hemorrhage or perforation: Secondary | ICD-10-CM | POA: Diagnosis not present

## 2022-10-25 DIAGNOSIS — K811 Chronic cholecystitis: Secondary | ICD-10-CM | POA: Diagnosis not present

## 2022-10-25 DIAGNOSIS — K909 Intestinal malabsorption, unspecified: Secondary | ICD-10-CM | POA: Diagnosis not present

## 2022-10-25 DIAGNOSIS — D649 Anemia, unspecified: Secondary | ICD-10-CM | POA: Diagnosis not present

## 2023-01-26 DIAGNOSIS — Z1231 Encounter for screening mammogram for malignant neoplasm of breast: Secondary | ICD-10-CM | POA: Diagnosis not present

## 2023-03-09 IMAGING — CT CT HEART MORP W/ CTA COR W/ SCORE W/ CA W/CM &/OR W/O CM
4 of 7 series · 8 of 20 positions shown, 9 images · non-contrast
Comparison: None.
COMPARISON: None.

Addendum:
EXAM:
OVER-READ INTERPRETATION  CT CHEST

The following report is an over-read performed by radiologist Dr.
Pazeloga Deti [REDACTED] on 04/20/2022. This
over-read does not include interpretation of cardiac or coronary
anatomy or pathology. The coronary calcium score/coronary CTA
interpretation by the cardiologist is attached.
CLINICAL DATA: 70 yo female with chest pain
Cardiac/Coronary CTA
TECHNIQUE: A non-contrast, gated CT scan was obtained with axial slices of 3 mm
through the heart for calcium scoring. Calcium scoring was performed
using the Agatston method. A 120 kV prospective, gated, contrast
cardiac scan was obtained. Gantry rotation speed was 250 msecs and
collimation was 0.6 mm. Two sublingual nitroglycerin tablets (0.8
mg) were given. The 3D data set was reconstructed in 5% intervals of
the 35-75% of the R-R cycle. Diastolic phases were analyzed on a
dedicated workstation using MPR, MIP, and VRT modes. The patient
received 95 cc of contrast.

[Series 6: best syst · axial · 0.38mm/px · z∈[+1295,+1329]mm · 2 of 257 slices shown, 3 images]
[im 86/257  vessel]
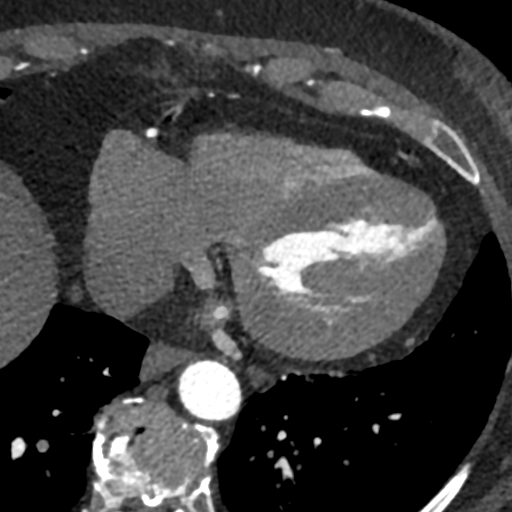
[im 86/257  lung]
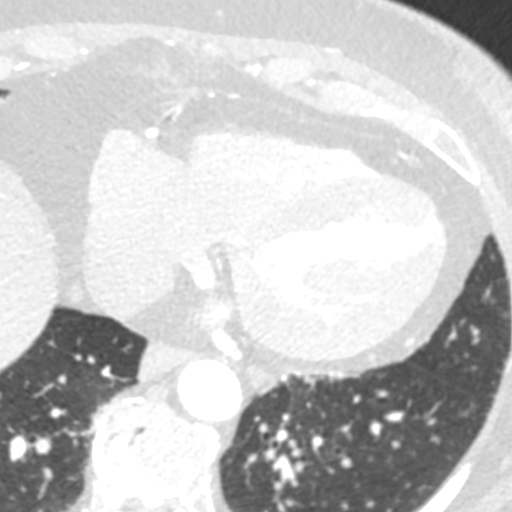
[im 171/257  vessel]
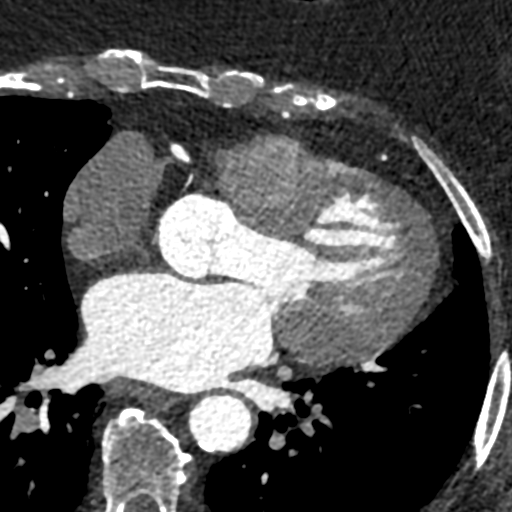

[Series 7: best diast · axial · 0.38mm/px · z∈[+1295,+1329]mm · 2 of 257 slices shown]
[im 86/257  vessel]
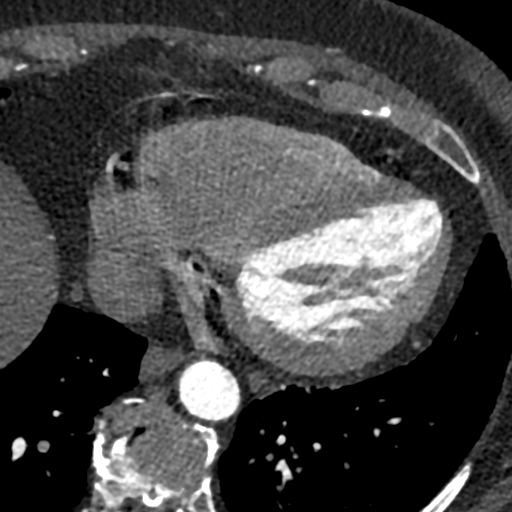
[im 171/257  vessel]
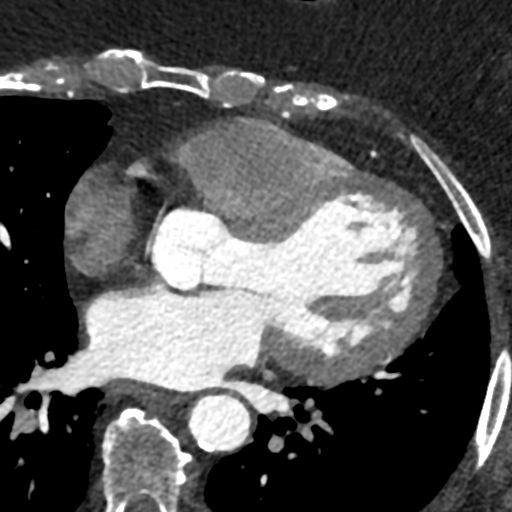

[Series 8: ts diast · axial · 0.38mm/px · z∈[+1295,+1329]mm · 2 of 257 slices shown]
[im 86/257  vessel]
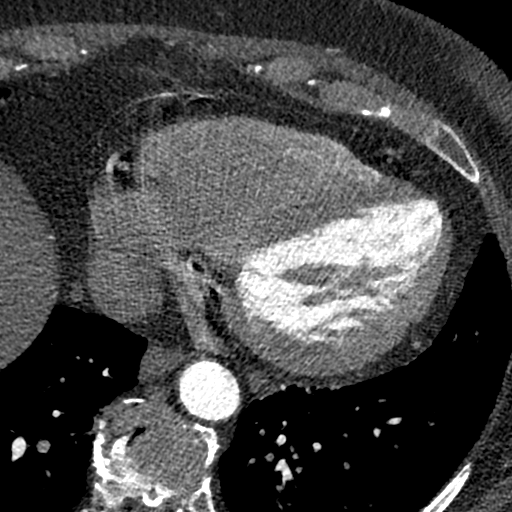
[im 171/257  vessel]
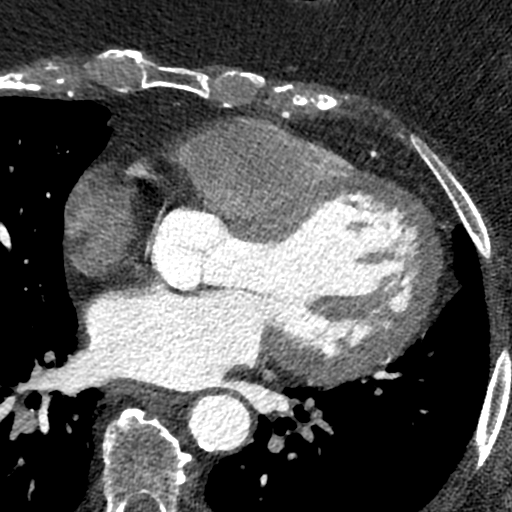

[Series 9: ts syst · axial · 0.38mm/px · z∈[+1295,+1329]mm · 2 of 257 slices shown]
[im 86/257  vessel]
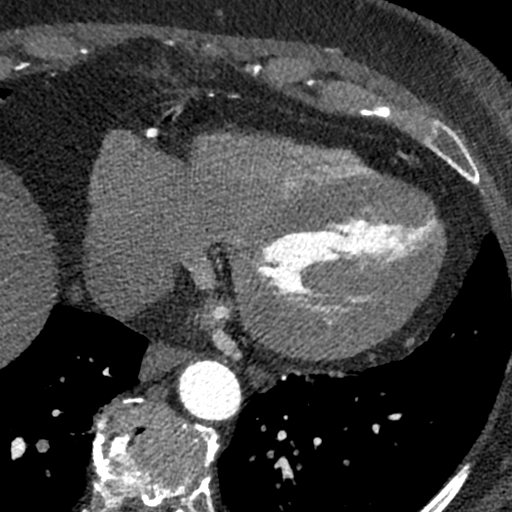
[im 171/257  vessel]
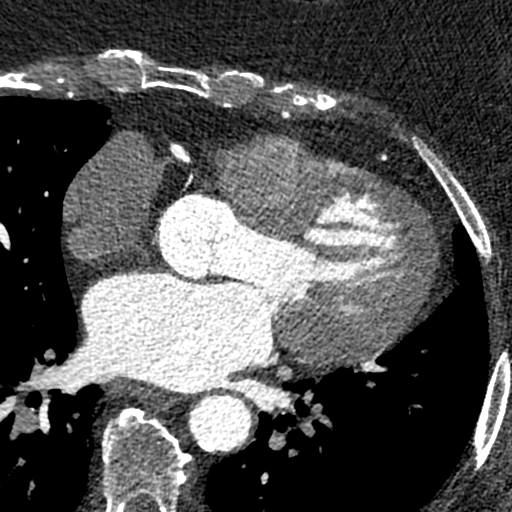

[8 of 20 positions shown; findings below may reference images not displayed]

FINDINGS: Atherosclerotic calcifications in the thoracic aorta. Within the
visualized portions of the thorax there are no suspicious appearing
pulmonary nodules or masses, there is no acute consolidative
airspace disease, no pleural effusions, no pneumothorax and no
lymphadenopathy. Visualized portions of the upper abdomen are
unremarkable. There are no aggressive appearing lytic or blastic
lesions noted in the visualized portions of the skeleton.
IMPRESSION: 1.  Aortic Atherosclerosis (RLNAD-LB0.0).
FINDINGS: Image quality: Poor.

Noise artifact is: Moderate.  (Signal to noise).

Coronary Arteries:  Normal coronary origin.  Right dominance.

Left main: The left main is a large caliber vessel with a normal
take off from the left coronary cusp that bifurcates to form a left
anterior descending artery and a left circumflex artery. There is no
plaque or stenosis.

Left anterior descending artery: The LAD has minimal (0-24)
calcified plaque in the proximal vessel; mild (25-49) calcified
stenosis in the mid vessel at takeoff of diagonal; mild (25-49)
calcified stenosis in the mid to distal vessel. The LAD gives off
one branching diagonal; there is minimal (0-24) calcified stenosis
in the proximal vessel and mild (25-49) soft plaque stenosis in the
mid vessel.

Left circumflex artery: The LCX is non-dominant with minimal (0-24)
calcified plaque in the proximal vessel. The LCX gives off 2 patent
obtuse marginal branches.

Right coronary artery: The RCA is dominant with normal take off from
the right coronary cusp. There is no evidence of plaque or stenosis.
The RCA terminates as a small PDA and right posterolateral branch
without evidence of plaque or stenosis.

Right Atrium: Right atrial size is within normal limits.

Right Ventricle: The right ventricular cavity is within normal
limits.

Left Atrium: Left atrial size is normal in size with no left atrial
appendage filling defect.

Left Ventricle: The ventricular cavity size is within normal limits.
There are no stigmata of prior infarction. There is no abnormal
filling defect.

Pulmonary arteries: Normal in size without proximal filling defect.

Pulmonary veins: Normal pulmonary venous drainage.

Pericardium: Normal thickness with no significant effusion or
calcium present.

Cardiac valves: The aortic valve is trileaflet without significant
calcification. The mitral valve is normal structure without
significant calcification.

Aorta: Normal caliber with aortic atherosclerosis.

Extra-cardiac findings: See attached radiology report for
non-cardiac structures.
IMPRESSION: 1. Coronary calcium score of 457. This was 92 percentile for age-,
sex, and race-matched controls.

2. Normal coronary origin with right dominance.

3. Mild CAD in the LAD and diagonal as outlined.

4. Aortic atherosclerosis.

RECOMMENDATIONS:
CAD-RADS 2: Mild non-obstructive CAD (25-49%). Consider
non-atherosclerotic causes of chest pain. Consider preventive
therapy and risk factor modification.

*** End of Addendum ***
EXAM:
OVER-READ INTERPRETATION  CT CHEST

The following report is an over-read performed by radiologist Dr.
Pazeloga Deti [REDACTED] on 04/20/2022. This
over-read does not include interpretation of cardiac or coronary
anatomy or pathology. The coronary calcium score/coronary CTA
interpretation by the cardiologist is attached.
FINDINGS: Atherosclerotic calcifications in the thoracic aorta. Within the
visualized portions of the thorax there are no suspicious appearing
pulmonary nodules or masses, there is no acute consolidative
airspace disease, no pleural effusions, no pneumothorax and no
lymphadenopathy. Visualized portions of the upper abdomen are
unremarkable. There are no aggressive appearing lytic or blastic
lesions noted in the visualized portions of the skeleton.
IMPRESSION: 1.  Aortic Atherosclerosis (RLNAD-LB0.0).

## 2023-03-16 DIAGNOSIS — K219 Gastro-esophageal reflux disease without esophagitis: Secondary | ICD-10-CM | POA: Diagnosis not present

## 2023-03-16 DIAGNOSIS — Z0000 Encounter for general adult medical examination without abnormal findings: Secondary | ICD-10-CM | POA: Diagnosis not present

## 2023-03-16 DIAGNOSIS — I25119 Atherosclerotic heart disease of native coronary artery with unspecified angina pectoris: Secondary | ICD-10-CM | POA: Diagnosis not present

## 2023-03-16 DIAGNOSIS — E782 Mixed hyperlipidemia: Secondary | ICD-10-CM | POA: Diagnosis not present

## 2023-03-16 DIAGNOSIS — I10 Essential (primary) hypertension: Secondary | ICD-10-CM | POA: Diagnosis not present

## 2023-03-16 DIAGNOSIS — Z9989 Dependence on other enabling machines and devices: Secondary | ICD-10-CM | POA: Diagnosis not present

## 2023-03-16 DIAGNOSIS — Z6841 Body Mass Index (BMI) 40.0 and over, adult: Secondary | ICD-10-CM | POA: Diagnosis not present

## 2023-03-16 DIAGNOSIS — E1169 Type 2 diabetes mellitus with other specified complication: Secondary | ICD-10-CM | POA: Diagnosis not present

## 2023-03-16 DIAGNOSIS — E119 Type 2 diabetes mellitus without complications: Secondary | ICD-10-CM | POA: Diagnosis not present

## 2023-03-16 DIAGNOSIS — Z1331 Encounter for screening for depression: Secondary | ICD-10-CM | POA: Diagnosis not present

## 2023-03-16 DIAGNOSIS — M1000 Idiopathic gout, unspecified site: Secondary | ICD-10-CM | POA: Diagnosis not present

## 2023-03-16 DIAGNOSIS — D72829 Elevated white blood cell count, unspecified: Secondary | ICD-10-CM | POA: Diagnosis not present

## 2023-03-16 DIAGNOSIS — D509 Iron deficiency anemia, unspecified: Secondary | ICD-10-CM | POA: Diagnosis not present

## 2023-03-21 ENCOUNTER — Other Ambulatory Visit: Payer: Self-pay | Admitting: Obstetrics & Gynecology

## 2023-03-21 DIAGNOSIS — E2839 Other primary ovarian failure: Secondary | ICD-10-CM

## 2023-04-02 ENCOUNTER — Other Ambulatory Visit (INDEPENDENT_AMBULATORY_CARE_PROVIDER_SITE_OTHER): Payer: PPO

## 2023-04-02 ENCOUNTER — Ambulatory Visit (INDEPENDENT_AMBULATORY_CARE_PROVIDER_SITE_OTHER): Payer: PPO | Admitting: Physician Assistant

## 2023-04-02 ENCOUNTER — Encounter: Payer: Self-pay | Admitting: Physician Assistant

## 2023-04-02 DIAGNOSIS — M25552 Pain in left hip: Secondary | ICD-10-CM

## 2023-04-02 NOTE — Progress Notes (Signed)
Office Visit Note   Patient: Diane Mayo           Date of Birth: 21-Mar-1951           MRN: [id number] Visit Date: 04/02/2023              Requested by: No referring provider defined for this encounter. PCP: Elias Else, MD (Inactive)   Assessment & Plan: Visit Diagnoses:  1. Pain in left hip     Plan: Offered.  Cortisone injection she defers.  Therefore we will have her do some IT band stretching exercises and these were shown and had her demonstrate.  Follow-up with [place] on an as-needed basis pain persist or becomes worse.  Questions were encouraged and answered at length  Follow-Up Instructions: Return if symptoms worsen or fail to improve.   Orders:  Orders Placed This Encounter  Procedures   XR HIP UNILAT W OR W/O PELVIS 2-3 VIEWS LEFT   No orders of the defined types were placed in this encounter.     Procedures: No procedures performed   Clinical Data: No additional findings.   Subjective: Chief Complaint  Patient presents with   Left Hip - Follow-up    HPI Diane Mayo 72 year old female comes in today with left hip pain.  We last saw her back in 2020 and referred her to Dr. Wynetta Emery for her lumbar spine.  She underwent posterior lumbar fusion L3-4 L4-5 and L5 5 interbody fusion states overall her back is doing well.  She said no radicular symptoms down either leg.  She is having pain left buttocks region down the lateral aspect of her left hip.  She states 3 weeks ago she was having severe pain to the she could not walk and had to use to canes to ambulate.  Her pain has progressively improved.  She denies any groin pain.  She points to the buttocks to the lateral aspect of the hip as a source of her pain. Review of Systems See HPI otherwise negative or noncontributory  Objective: Vital Signs: There were no vitals taken for this visit.  Physical Exam Constitutional:      Appearance: She is not ill-appearing or diaphoretic.  Pulmonary:     Effort: Pulmonary  effort is normal.  Neurological:     Mental Status: She is alert and oriented to person, place, and time.  Psychiatric:        Mood and Affect: Mood normal.     Ortho Exam Bilateral hips good range of motion of both hips she is able to cross her legs.  Tenderness left greater than right trochanteric bursa region.  Tenderness down the left IT band with palpation.  Ambulates without any assistive devices nonantalgic gait. Specialty Comments:  No specialty comments available.  Imaging: XR HIP UNILAT W OR W/O PELVIS 2-3 VIEWS LEFT  Result Date: 04/02/2023 AP pelvis lateral view of the left hip: Bilateral hips are well located.  No acute fractures.  Hip joints well-maintained.    PMFS History: Patient Active Problem List   Diagnosis Date Noted   Acute respiratory failure with hypoxia 02/04/2022   Acute pulmonary edema    Sepsis    Spinal stenosis of lumbar region 02/01/2022   Essential hypertension 07/08/2019   Candidal vulvovaginitis 01/25/2019   Diabetes mellitus 01/25/2019   Menopausal symptom 01/25/2019   Obesity 01/25/2019   LBBB (left bundle branch block) 01/10/2018   Acute cystitis with hematuria 12/10/2014   Burning with urination 11/11/2014  Frequency of urination 08/13/2013   Chronic interstitial cystitis 08/07/2012   Past Medical History:  Diagnosis Date   Anemia    Diabetes mellitus without complication    Type 2   Genital herpes    GERD (gastroesophageal reflux disease)    Hypertension    Interstitial cystitis    Left bundle branch block (LBBB)    chronic   Leukocytosis    Osteoarthritis    Plantar fasciitis     Family History  Problem Relation Age of Onset   Hypertension Mother    Diabetes Mother    Other Mother        hay fever   Pneumonia Mother        aspiration   Gout Father    Skin cancer Father    Heart attack Father    Kidney failure Father    Hypertension Father    CVA Father    CAD Father    Asthma Brother    Other Brother         hay fever   Diabetes type II Maternal Grandfather    Alcoholism Brother     Past Surgical History:  Procedure Laterality Date   BACK SURGERY     CARPAL TUNNEL RELEASE Bilateral    CESAREAN SECTION     twice 1978 and 1987   DILATION AND CURETTAGE OF UTERUS  1975   EYE SURGERY Bilateral 2021   cataracts removed   SHOULDER SURGERY Right 2012   rotator cuff repair   TUBAL LIGATION  1987   Social History   Occupational History   Not on file  Tobacco Use   Smoking status: Former    Packs/day: 0.50    Years: 1.00    Additional pack years: 0.00    Total pack years: 0.50    Types: Cigarettes    Quit date: 66    Years since quitting: 37.2   Smokeless tobacco: Never  Vaping Use   Vaping Use: Never used  Substance and Sexual Activity   Alcohol use: Yes    Comment: maybe one glass of champagne on holidays   Drug use: No   Sexual activity: Not on file

## 2023-04-17 DIAGNOSIS — D509 Iron deficiency anemia, unspecified: Secondary | ICD-10-CM | POA: Diagnosis not present

## 2023-04-28 IMAGING — US US ABDOMEN LIMITED
1 series · 14 of 25 positions shown · non-contrast
Comparison: No prior abdominal ultrasound

CLINICAL DATA: Right upper quadrant tenderness

EXAM:
ULTRASOUND ABDOMEN LIMITED RIGHT UPPER QUADRANT

[Series 1: us abdomen limited · 14 of 44 slices shown]
[im 1/44]
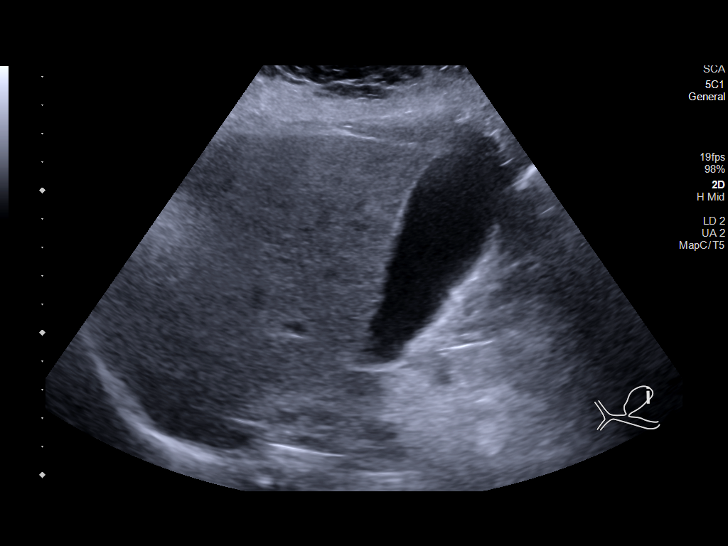
[im 4/44]
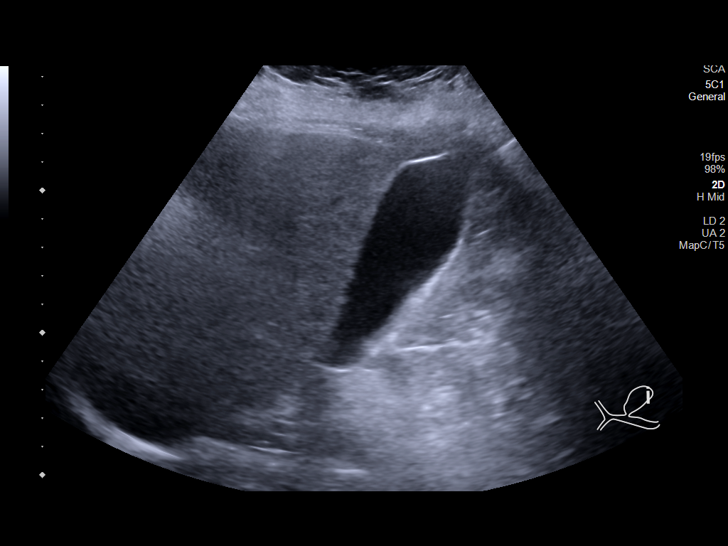
[im 8/44]
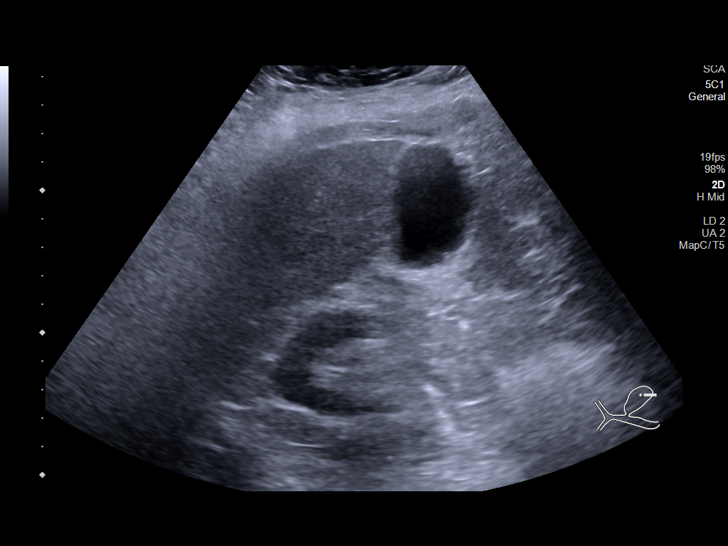
[im 11/44]
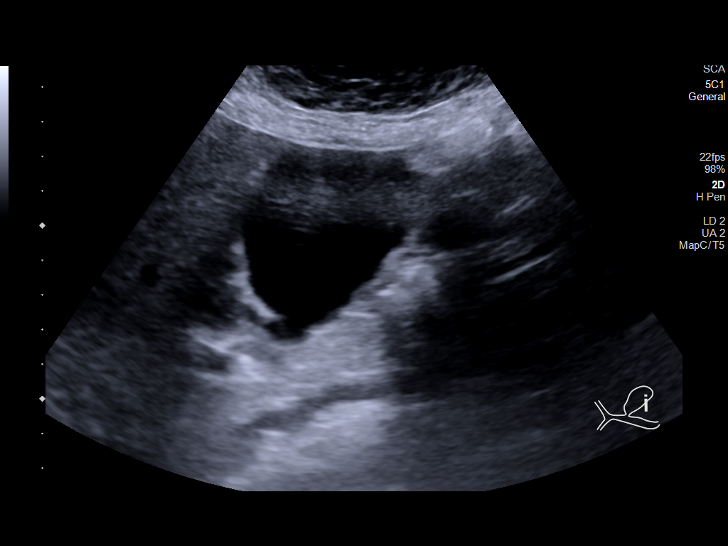
[im 15/44]
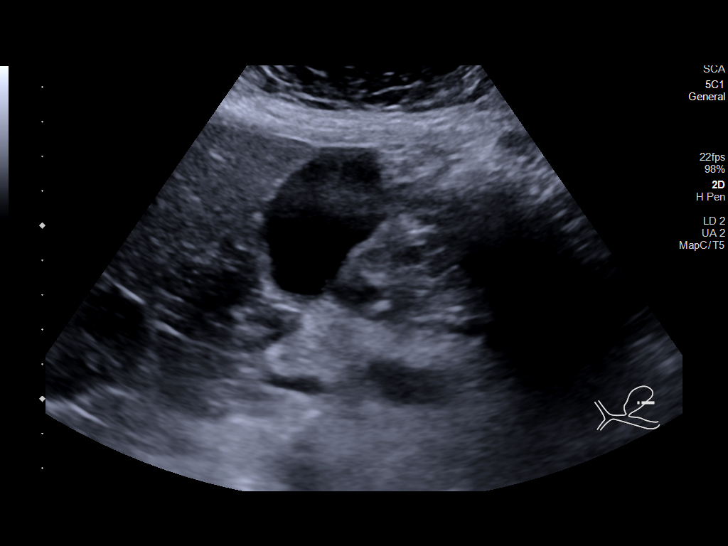
[im 17/44]
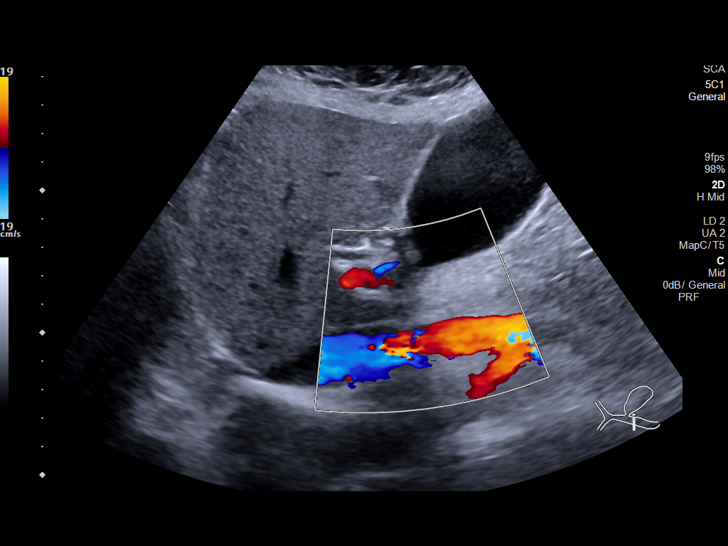
[im 20/44]
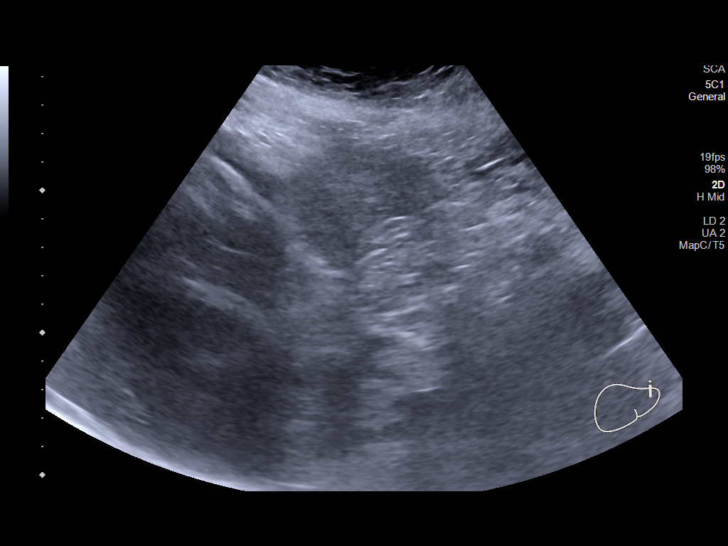
[im 24/44]
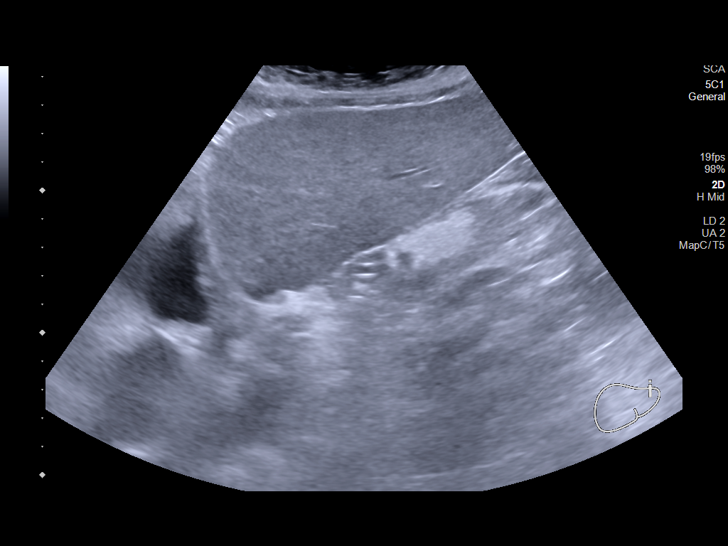
[im 27/44]
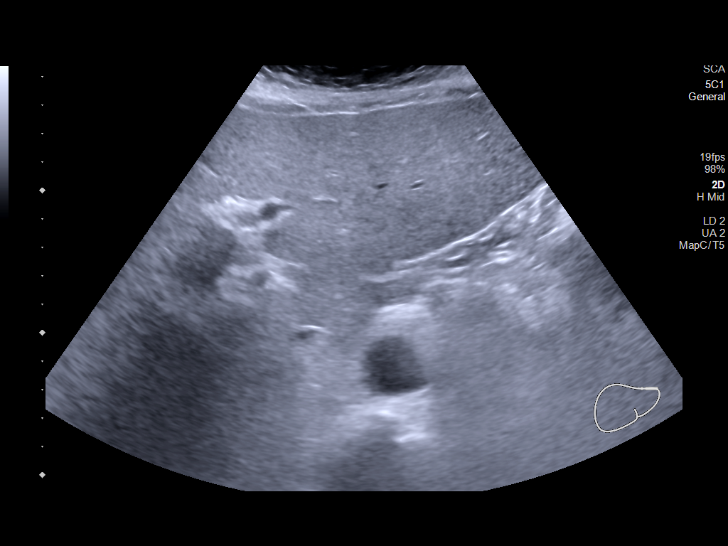
[im 29/44]
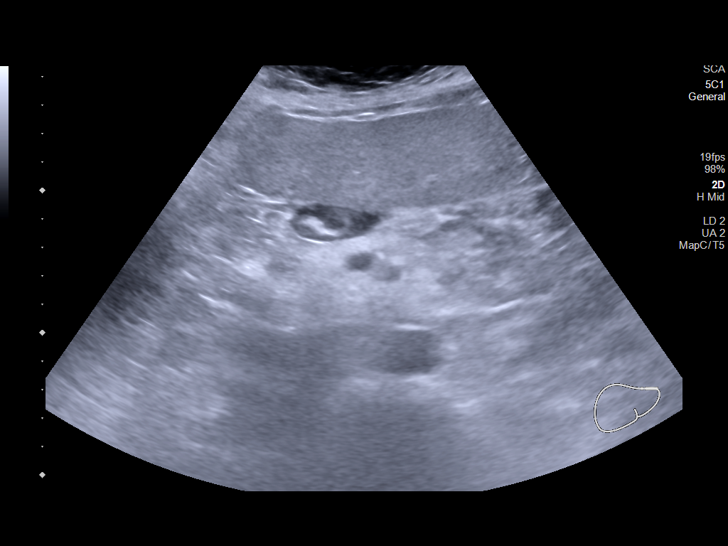
[im 33/44]
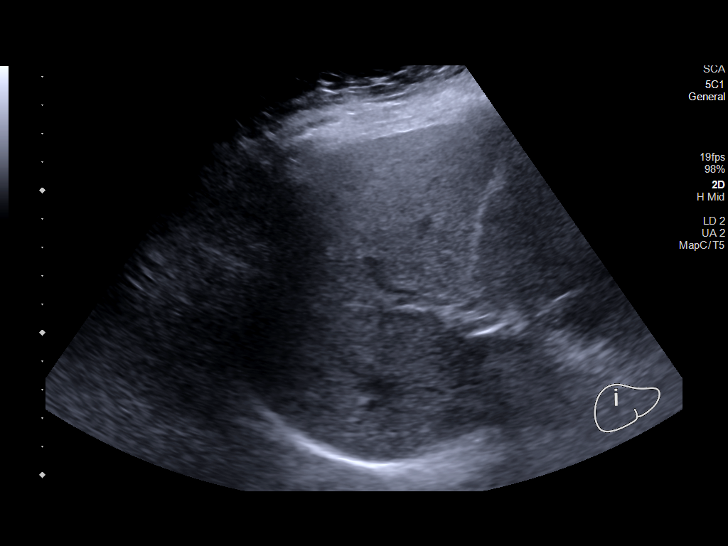
[im 36/44]
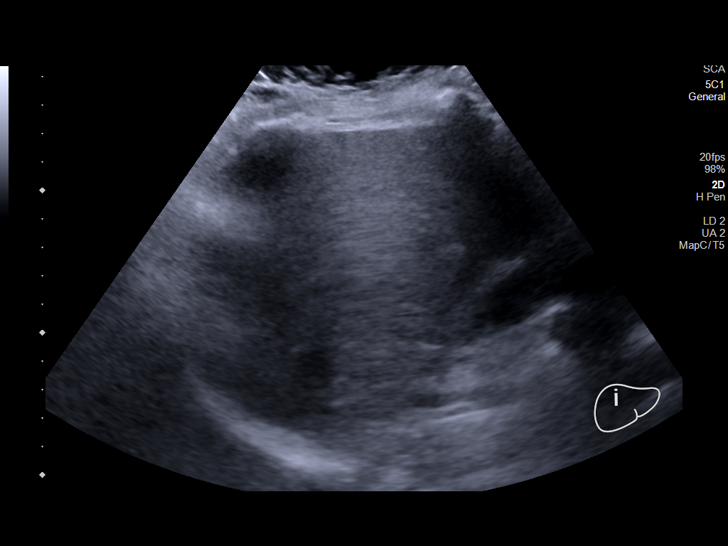
[im 40/44]
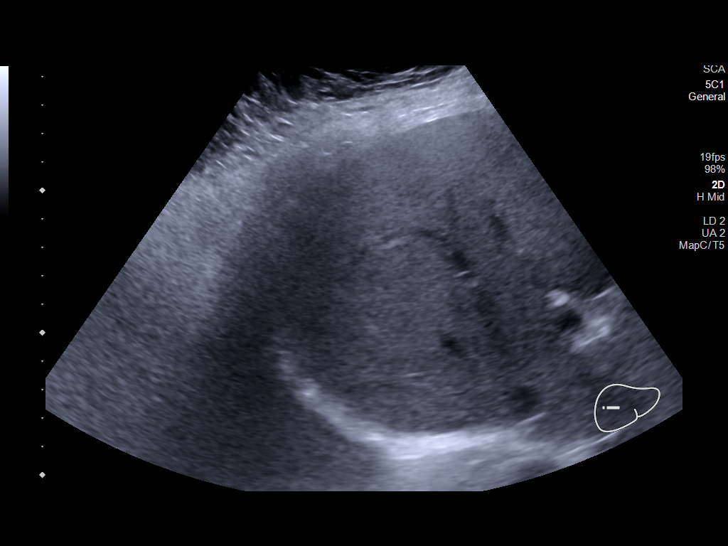
[im 44/44]
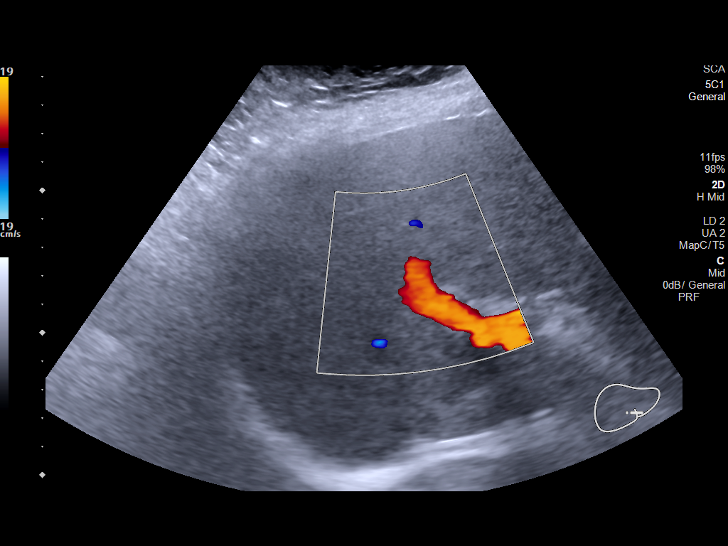

[14 of 25 positions shown; findings below may reference images not displayed]

FINDINGS: Gallbladder:

No gallstones or wall thickening visualized. No sonographic Murphy
sign noted by sonographer.

Common bile duct:

Diameter: 3 mm

Liver:

No focal lesion identified. Within normal limits in parenchymal
echogenicity. Portal vein is patent on color Doppler imaging with
normal direction of blood flow towards the liver.

Other: None.
IMPRESSION: No acute finding in the right upper quadrant.

## 2023-06-01 DIAGNOSIS — D509 Iron deficiency anemia, unspecified: Secondary | ICD-10-CM | POA: Diagnosis not present

## 2023-06-21 DIAGNOSIS — Z87440 Personal history of urinary (tract) infections: Secondary | ICD-10-CM | POA: Diagnosis not present

## 2023-06-21 DIAGNOSIS — N3010 Interstitial cystitis (chronic) without hematuria: Secondary | ICD-10-CM | POA: Diagnosis not present

## 2023-07-09 DIAGNOSIS — H5203 Hypermetropia, bilateral: Secondary | ICD-10-CM | POA: Diagnosis not present

## 2023-07-09 DIAGNOSIS — H35033 Hypertensive retinopathy, bilateral: Secondary | ICD-10-CM | POA: Diagnosis not present

## 2023-07-09 DIAGNOSIS — H524 Presbyopia: Secondary | ICD-10-CM | POA: Diagnosis not present

## 2023-07-09 DIAGNOSIS — I10 Essential (primary) hypertension: Secondary | ICD-10-CM | POA: Diagnosis not present

## 2023-07-09 DIAGNOSIS — Z79899 Other long term (current) drug therapy: Secondary | ICD-10-CM | POA: Diagnosis not present

## 2023-07-09 DIAGNOSIS — H3562 Retinal hemorrhage, left eye: Secondary | ICD-10-CM | POA: Diagnosis not present

## 2023-07-09 DIAGNOSIS — E119 Type 2 diabetes mellitus without complications: Secondary | ICD-10-CM | POA: Diagnosis not present

## 2023-07-09 DIAGNOSIS — H52223 Regular astigmatism, bilateral: Secondary | ICD-10-CM | POA: Diagnosis not present

## 2023-07-10 DIAGNOSIS — D649 Anemia, unspecified: Secondary | ICD-10-CM | POA: Diagnosis not present

## 2023-08-02 DIAGNOSIS — G8929 Other chronic pain: Secondary | ICD-10-CM | POA: Diagnosis not present

## 2023-08-02 DIAGNOSIS — E1169 Type 2 diabetes mellitus with other specified complication: Secondary | ICD-10-CM | POA: Diagnosis not present

## 2023-08-02 DIAGNOSIS — K219 Gastro-esophageal reflux disease without esophagitis: Secondary | ICD-10-CM | POA: Diagnosis not present

## 2023-08-02 DIAGNOSIS — I10 Essential (primary) hypertension: Secondary | ICD-10-CM | POA: Diagnosis not present

## 2023-08-02 DIAGNOSIS — M1990 Unspecified osteoarthritis, unspecified site: Secondary | ICD-10-CM | POA: Diagnosis not present

## 2023-08-02 DIAGNOSIS — R32 Unspecified urinary incontinence: Secondary | ICD-10-CM | POA: Diagnosis not present

## 2023-08-02 DIAGNOSIS — J309 Allergic rhinitis, unspecified: Secondary | ICD-10-CM | POA: Diagnosis not present

## 2023-08-02 DIAGNOSIS — E785 Hyperlipidemia, unspecified: Secondary | ICD-10-CM | POA: Diagnosis not present

## 2023-08-02 DIAGNOSIS — Z792 Long term (current) use of antibiotics: Secondary | ICD-10-CM | POA: Diagnosis not present

## 2023-08-02 DIAGNOSIS — M109 Gout, unspecified: Secondary | ICD-10-CM | POA: Diagnosis not present

## 2023-08-02 DIAGNOSIS — D509 Iron deficiency anemia, unspecified: Secondary | ICD-10-CM | POA: Diagnosis not present

## 2023-08-28 DIAGNOSIS — L821 Other seborrheic keratosis: Secondary | ICD-10-CM | POA: Diagnosis not present

## 2023-08-28 DIAGNOSIS — I872 Venous insufficiency (chronic) (peripheral): Secondary | ICD-10-CM | POA: Diagnosis not present

## 2023-08-28 DIAGNOSIS — Z9049 Acquired absence of other specified parts of digestive tract: Secondary | ICD-10-CM | POA: Diagnosis not present

## 2023-08-28 DIAGNOSIS — R195 Other fecal abnormalities: Secondary | ICD-10-CM | POA: Diagnosis not present

## 2023-08-28 DIAGNOSIS — I447 Left bundle-branch block, unspecified: Secondary | ICD-10-CM | POA: Diagnosis not present

## 2023-08-28 DIAGNOSIS — L570 Actinic keratosis: Secondary | ICD-10-CM | POA: Diagnosis not present

## 2023-08-28 DIAGNOSIS — D508 Other iron deficiency anemias: Secondary | ICD-10-CM | POA: Diagnosis not present

## 2023-08-28 DIAGNOSIS — R198 Other specified symptoms and signs involving the digestive system and abdomen: Secondary | ICD-10-CM | POA: Diagnosis not present

## 2023-08-28 DIAGNOSIS — I8311 Varicose veins of right lower extremity with inflammation: Secondary | ICD-10-CM | POA: Diagnosis not present

## 2023-08-28 DIAGNOSIS — I8312 Varicose veins of left lower extremity with inflammation: Secondary | ICD-10-CM | POA: Diagnosis not present

## 2023-08-28 DIAGNOSIS — L819 Disorder of pigmentation, unspecified: Secondary | ICD-10-CM | POA: Diagnosis not present

## 2023-08-28 DIAGNOSIS — D485 Neoplasm of uncertain behavior of skin: Secondary | ICD-10-CM | POA: Diagnosis not present

## 2023-08-28 DIAGNOSIS — K219 Gastro-esophageal reflux disease without esophagitis: Secondary | ICD-10-CM | POA: Diagnosis not present

## 2023-08-28 DIAGNOSIS — D225 Melanocytic nevi of trunk: Secondary | ICD-10-CM | POA: Diagnosis not present

## 2023-08-29 ENCOUNTER — Telehealth: Payer: Self-pay | Admitting: *Deleted

## 2023-08-29 NOTE — Telephone Encounter (Signed)
   Name: Diane Mayo  DOB: 1951/02/09  MRN: [id number]  Primary Cardiologist: Lance Muss, MD  Chart reviewed as part of pre-operative protocol coverage. Because of [name]'s past medical history and time since last visit, she will require a follow-up in-office visit in order to better assess preoperative cardiovascular risk. Pt was last seen in office in July 2023.   Pre-op covering staff: - Please schedule appointment and call patient to inform them. If patient already had an upcoming appointment within acceptable timeframe, please add "pre-op clearance" to the appointment notes so provider is aware. - Please contact requesting surgeon's office via preferred method (i.e, phone, fax) to inform them of need for appointment prior to surgery.   Joylene Grapes, NP  08/29/2023, 9:16 AM

## 2023-08-29 NOTE — Telephone Encounter (Signed)
   Pre-operative Risk Assessment    Patient Name: Diane Mayo  DOB: Jan 10, 1951 MRN: [id number]   DATE OF LAST VISIT:  10/06/22 Diane Mayo, PAC DATE OF NEXT VISIT: NONE  Request for Surgical Clearance    Procedure:   COLONOSCOPY & ENDOSCOPY  (HEME POSITIVE STOOL, IDA)  Date of Surgery:  Clearance 09/26/23                                 Surgeon:  DR. Sammuel Cooper Surgeon's Group or Practice Name:  [facility] Phone number:  [phone] Fax number:  [phone]   Type of Clearance Requested:   - Medical ; NO MEDICATIONS LISTED AS NEEDING TO BE HELD   Type of Anesthesia:   PROPOFOL   Additional requests/questions:    Diane, Mayo   08/29/2023, 8:34 AM

## 2023-08-29 NOTE — Telephone Encounter (Signed)
Pt has been scheduled to see Robin Searing, NP 09/06/23 @ 8:50 for preop clearance. I will update all parties involved.

## 2023-09-05 NOTE — Progress Notes (Unsigned)
Cardiology Office Note    Patient Name: Diane Mayo Date of Encounter: 09/05/2023  Primary Care Provider:  Elias Else, MD (Inactive) Primary Cardiologist:  Lance Muss, MD Primary Electrophysiologist: None   Past Medical History    Past Medical History:  Diagnosis Date   Anemia    Diabetes mellitus without complication (HCC)    Type 2   Genital herpes    GERD (gastroesophageal reflux disease)    Hypertension    Interstitial cystitis    Left bundle branch block (LBBB)    chronic   Leukocytosis    Osteoarthritis    Plantar fasciitis     History of Present Illness  Diane Mayo is a 72 y.o. female with a PMH of HFrEF, HTN, LBBB, DM type II, anemia, obesity, moderate MR, coronary calcifications, family history of CAD who presents today for preoperative clearance.  Ms. Daigneau was seen initially by Dr. Eldridge Dace 12/2017 for management of hypertension.  She was admitted on 01/2022 with flash pulmonary edema with pneumonia.  2D echo was completed showing  LV function at 40 to 45% (previously 50 to 55% in 2020, grade 2 diastolic dysfunction, elevated LA pressure,/lateral wall synchrony due to LBBB, mild to moderate MR.  She was treated with diuretics and CT angio of the chest was completed showing coronary calcifications. She was last seen by Dr. Eldridge Dace on 07/12/2022 for surgical clearance for endoscopy. She reported persistent nausea since back surgery in 2023.  Endorsed occasional edema in her ankles and BP was well-controlled.  During today's visit the patient reports that she has been doing well with no new cardiac complaints since previous visit.  Her blood pressure today was controlled at 130/62 and heart rate was 68 bpm.  She is euvolemic on examination with trace lower extremity edema today.  She reports no indiscretions with sodium and is compliant with her current medication regimen.  During today's visit we discussed the pathophysiology of left bundle branch block as well  as heart failure with reduced EF.  She had all questions answered to her satisfaction.  She is aware of her treatment plan moving forward and was advised to contact us if she has any further questions.  She is scheduled to undergo a colonoscopy and endoscopy procedure and presents today for clearance.  Patient denies chest pain, palpitations, dyspnea, PND, orthopnea, nausea, vomiting, dizziness, syncope, edema, weight gain, or early satiety.   Review of Systems  Please see the history of present illness.    All other systems reviewed and are otherwise negative except as noted above.  Physical Exam    Wt Readings from Last 3 Encounters:  10/16/22 212 lb 1.3 oz (96.2 kg)  10/11/22 212 lb (96.2 kg)  07/12/22 218 lb (98.9 kg)   WR:UEAVW were no vitals filed for this visit.,There is no height or weight on file to calculate BMI. GEN: Well nourished, well developed in no acute distress Neck: No JVD; No carotid bruits Pulmonary: Clear to auscultation without rales, wheezing or rhonchi  Cardiovascular: Normal rate. Regular rhythm. Normal S1. Normal S2.   Murmurs: There is no murmur.  ABDOMEN: Soft, non-tender, non-distended EXTREMITIES:  No edema; No deformity   EKG/LABS/ Recent Cardiac Studies   ECG personally reviewed by me today -sinus rhythm with first-degree AVB and LBBB with left axis deviation and rate of 68 bpm with no acute changes.  Risk Assessment/Calculations:          Lab Results  Component Value Date   WBC  11.8 (H) 10/11/2022   HGB 11.0 (L) 10/11/2022   HCT 35.8 (L) 10/11/2022   MCV 83.1 10/11/2022   PLT 313 10/11/2022   Lab Results  Component Value Date   CREATININE 1.04 (H) 10/11/2022   BUN 21 10/11/2022   NA 139 10/11/2022   K 4.6 10/11/2022   CL 105 10/11/2022   CO2 24 10/11/2022   Lab Results  Component Value Date   CHOL 118 02/07/2022   HDL 27 (L) 02/07/2022   LDLCALC 36 02/07/2022   TRIG 273 (H) 02/07/2022   CHOLHDL 4.4 02/07/2022    Lab Results   Component Value Date   HGBA1C 6.6 (H) 10/11/2022   Assessment & Plan    1.  Preoperative clearance: -Patient's RCRI score is 0.9% -The patient affirms she has been doing well without any new cardiac symptoms. They are able to achieve 6 METS without cardiac limitations. Therefore, based on ACC/AHA guidelines, the patient would be at acceptable risk for the planned procedure without further cardiovascular testing. The patient was advised that if she develops new symptoms prior to surgery to contact our office to arrange for a follow-up visit, and she verbalized understanding.  -Patient can hold aspirin 5 to 7 days prior to procedure and should restart postprocedure as guided by gastroenterology.  2.  HFrEF: -Completed 03/2022 with mildly reduced EF of 45-50% with RWMA and mild concentric LVH with grade 2 DD with mild MVR. -We will recheck 2D echo for surveillance and to monitor LV function -Patient is euvolemic today on exam with trace lower extremity edema present. -Continue metoprolol 75 mg twice daily, irbesartan 300 mg daily -Low sodium diet, fluid restriction <2L, and daily weights encouraged. Educated to contact our office for weight gain of 2 lbs overnight or 5 lbs in one week.   3.  Essential hypertension: -Patient's blood pressure today was controlled at 130/62 -Continue irbesartan 300 mg daily, metoprolol 75 mg twice daily, Norvasc 10 mg daily HCTZ 12.5 mg daily  4.  LBBB: -Currently asymptomatic with ongoing monitoring -2D echo will be completed for surveillance of LV function  5.  Hyperlipidemia: -Patient's last LDL cholesterol was 42 however triglycerides were 590 -We will discontinue pravastatin and start Crestor 20 mg daily -We will check LFTs and lipids in 8 weeks -Patient will be referred to lipid clinic if triglycerides are still elevated with new statin therapy.  6.  Nonobstructive CAD: -Has any chest pain or anginal equivalent. -Coronary calcification seen on CT of  the chest in 2023 -Continue current GDMT with ASA 81 mg, metoprolol 75 mg twice daily  Disposition: Follow-up with Lance Muss, MD or APP in 6 months    Signed, Napoleon Form, Leodis Rains, NP 09/05/2023, 7:49 AM [facility]

## 2023-09-06 ENCOUNTER — Ambulatory Visit: Payer: PPO | Attending: Nurse Practitioner | Admitting: Nurse Practitioner

## 2023-09-06 ENCOUNTER — Encounter: Payer: Self-pay | Admitting: Nurse Practitioner

## 2023-09-06 VITALS — BP 130/62 | HR 68 | Ht 61.5 in | Wt 211.6 lb

## 2023-09-06 DIAGNOSIS — I5022 Chronic systolic (congestive) heart failure: Secondary | ICD-10-CM | POA: Diagnosis not present

## 2023-09-06 DIAGNOSIS — E782 Mixed hyperlipidemia: Secondary | ICD-10-CM | POA: Diagnosis not present

## 2023-09-06 DIAGNOSIS — Z01810 Encounter for preprocedural cardiovascular examination: Secondary | ICD-10-CM | POA: Diagnosis not present

## 2023-09-06 DIAGNOSIS — I10 Essential (primary) hypertension: Secondary | ICD-10-CM | POA: Diagnosis not present

## 2023-09-06 DIAGNOSIS — I25118 Atherosclerotic heart disease of native coronary artery with other forms of angina pectoris: Secondary | ICD-10-CM | POA: Diagnosis not present

## 2023-09-06 DIAGNOSIS — I447 Left bundle-branch block, unspecified: Secondary | ICD-10-CM | POA: Diagnosis not present

## 2023-09-06 MED ORDER — ROSUVASTATIN CALCIUM 20 MG PO TABS: 20.0000 mg | ORAL_TABLET | Freq: Every day | ORAL | 6 refills | Status: DC

## 2023-09-06 NOTE — Patient Instructions (Addendum)
Medication Instructions:  STOP Pravachol START Crestor 20mg  Take 1 tablet once a day  *If you need a refill on your cardiac medications before your next appointment, please call your pharmacy*   Lab Work: 8 weeks FASTING LFT & LIPID's If you have labs (blood work) drawn today and your tests are completely normal, you will receive your results only by: MyChart Message (if you have MyChart) OR A paper copy in the mail If you have any lab test that is abnormal or we need to change your treatment, we will call you to review the results.   Testing/Procedures: Your physician has requested that you have an echocardiogram. Echocardiography is a painless test that uses sound waves to create images of your heart. It provides your doctor with information about the size and shape of your heart and how well your heart's chambers and valves are working. This procedure takes approximately one hour. There are no restrictions for this procedure. Please do NOT wear cologne, perfume, aftershave, or lotions (deodorant is allowed). Please arrive 15 minutes prior to your appointment time.   Follow-Up: At [facility], you and your health needs are our priority.  As part of our continuing mission to provide you with exceptional heart care, we have created designated Provider Care Teams.  These Care Teams include your primary Cardiologist (physician) and Advanced Practice Providers (APPs -  Physician Assistants and Nurse Practitioners) who all work together to provide you with the care you need, when you need it.  We recommend signing up for the patient portal called "MyChart".  Sign up information is provided on this After Visit Summary.  MyChart is used to connect with patients for Virtual Visits (Telemedicine).  Patients are able to view lab/test results, encounter notes, upcoming appointments, etc.  Non-urgent messages can be sent to your provider as well.   To learn more about what you can do with  MyChart, go to [email or web address].    Your next appointment:   6 month(s)  Provider:   Dr Jacinto Halim Dr Odis Hollingshead Dr Rosemary Holms   Other Instructions: Please check your weight daily. Please contact the office if you gain more than 3lbs in a day or 5lbs in a week.  Limit your salt intake to 1500-2000mg  per day or 500mg  of Sodium per meal.     Heart-Healthy Eating Plan Eating a healthy diet is important for the health of your heart. A heart-healthy eating plan includes: Eating less unhealthy fats. Eating more healthy fats. Eating less salt in your food. Salt is also called sodium. Making other changes in your diet. Talk with your doctor or a diet specialist (dietitian) to create an eating plan that is right for you. What is my plan? Your doctor may recommend an eating plan that includes: Total fat: ______% or less of total calories a day. Saturated fat: ______% or less of total calories a day. Cholesterol: less than _________mg a day. Sodium: less than _________mg a day. What are tips for following this plan? Cooking Avoid frying your food. Try to bake, boil, grill, or broil it instead. You can also reduce fat by: Removing the skin from poultry. Removing all visible fats from meats. Steaming vegetables in water or broth. Meal planning  At meals, divide your plate into four equal parts: Fill one-half of your plate with vegetables and green salads. Fill one-fourth of your plate with whole grains. Fill one-fourth of your plate with lean protein foods. Eat 2-4 cups of vegetables per day. One cup of  vegetables is: 1 cup (91 g) broccoli or cauliflower florets. 2 medium carrots. 1 large bell pepper. 1 large sweet potato. 1 large tomato. 1 medium white potato. 2 cups (150 g) raw leafy greens. Eat 1-2 cups of fruit per day. One cup of fruit is: 1 small apple 1 large banana 1 cup (237 g) mixed fruit, 1 large orange,  cup (82 g) dried fruit, 1 cup (240 mL) 100% fruit  juice. Eat more foods that have soluble fiber. These are apples, broccoli, carrots, beans, peas, and barley. Try to get 20-30 g of fiber per day. Eat 4-5 servings of nuts, legumes, and seeds per week: 1 serving of dried beans or legumes equals  cup (90 g) cooked. 1 serving of nuts is  oz (12 almonds, 24 pistachios, or 7 walnut halves). 1 serving of seeds equals  oz (8 g). General information Eat more home-cooked food. Eat less restaurant, buffet, and fast food. Limit or avoid alcohol. Limit foods that are high in starch and sugar. Avoid fried foods. Lose weight if you are overweight. Keep track of how much salt (sodium) you eat. This is important if you have high blood pressure. Ask your doctor to tell you more about this. Try to add vegetarian meals each week. Fats Choose healthy fats. These include olive oil and canola oil, flaxseeds, walnuts, almonds, and seeds. Eat more omega-3 fats. These include salmon, mackerel, sardines, tuna, flaxseed oil, and ground flaxseeds. Try to eat fish at least 2 times each week. Check food labels. Avoid foods with trans fats or high amounts of saturated fat. Limit saturated fats. These are often found in animal products, such as meats, butter, and cream. These are also found in plant foods, such as palm oil, palm kernel oil, and coconut oil. Avoid foods with partially hydrogenated oils in them. These have trans fats. Examples are stick margarine, some tub margarines, cookies, crackers, and other baked goods. What foods should I eat? Fruits All fresh, canned (in natural juice), or frozen fruits. Vegetables Fresh or frozen vegetables (raw, steamed, roasted, or grilled). Green salads. Grains Most grains. Choose whole wheat and whole grains most of the time. Rice and pasta, including brown rice and pastas made with whole wheat. Meats and other proteins Lean, well-trimmed beef, veal, pork, and lamb. Chicken and [place] without skin. All fish and  shellfish. Wild duck, rabbit, pheasant, and venison. Egg whites or low-cholesterol egg substitutes. Dried beans, peas, lentils, and tofu. Seeds and most nuts. Dairy Low-fat or nonfat cheeses, including ricotta and mozzarella. Skim or 1% milk that is liquid, powdered, or evaporated. Buttermilk that is made with low-fat milk. Nonfat or low-fat yogurt. Fats and oils Non-hydrogenated (trans-free) margarines. Vegetable oils, including soybean, sesame, sunflower, olive, peanut, safflower, corn, canola, and cottonseed. Salad dressings or mayonnaise made with a vegetable oil. Beverages Mineral water. Coffee and tea. Diet carbonated beverages. Sweets and desserts Sherbet, gelatin, and fruit ice. Small amounts of dark chocolate. Limit all sweets and desserts. Seasonings and condiments All seasonings and condiments. The items listed above may not be a complete list of foods and drinks you can eat. Contact a dietitian for more options. What foods should I avoid? Fruits Canned fruit in heavy syrup. Fruit in cream or butter sauce. Fried fruit. Limit coconut. Vegetables Vegetables cooked in cheese, cream, or butter sauce. Fried vegetables. Grains Breads that are made with saturated or trans fats, oils, or whole milk. Croissants. Sweet rolls. Donuts. High-fat crackers, such as cheese crackers. Meats and other proteins  Fatty meats, such as hot dogs, ribs, sausage, bacon, rib-eye roast or steak. High-fat deli meats, such as salami and bologna. Caviar. Domestic duck and goose. Organ meats, such as liver. Dairy Cream, sour cream, cream cheese, and creamed cottage cheese. Whole-milk cheeses. Whole or 2% milk that is liquid, evaporated, or condensed. Whole buttermilk. Cream sauce or high-fat cheese sauce. Yogurt that is made from whole milk. Fats and oils Meat fat, or shortening. Cocoa butter, hydrogenated oils, palm oil, coconut oil, palm kernel oil. Solid fats and shortenings, including bacon fat, salt pork,  lard, and butter. Nondairy cream substitutes. Salad dressings with cheese or sour cream. Beverages Regular sodas and juice drinks with added sugar. Sweets and desserts Frosting. Pudding. Cookies. Cakes. Pies. Milk chocolate or white chocolate. Buttered syrups. Full-fat ice cream or ice cream drinks. The items listed above may not be a complete list of foods and drinks to avoid. Contact a dietitian for more information. Summary Heart-healthy meal planning includes eating less unhealthy fats, eating more healthy fats, and making other changes in your diet. Eat a balanced diet. This includes fruits and vegetables, low-fat or nonfat dairy, lean protein, nuts and legumes, whole grains, and heart-healthy oils and fats. This information is not intended to replace advice given to you by your health care provider. Make sure you discuss any questions you have with your health care provider. Document Revised: 01/16/2022 Document Reviewed: 01/16/2022 Elsevier Patient Education  2024 [employer]   Low-Sodium Eating Plan Salt (sodium) helps you keep a healthy balance of fluids in your body. Too much sodium can raise your blood pressure. It can also cause fluid and waste to be held in your body. Your health care provider or dietitian may recommend a low-sodium eating plan if you have high blood pressure (hypertension), kidney disease, liver disease, or heart failure. Eating less sodium can help lower your blood pressure and reduce swelling. It can also protect your heart, liver, and kidneys. What are tips for following this plan? Reading food labels  Check food labels for the amount of sodium per serving. If you eat more than one serving, you must multiply the listed amount by the number of servings. Choose foods with less than 140 milligrams (mg) of sodium per serving. Avoid foods with 300 mg of sodium or more per serving. Always check how much sodium is in a product, even if the label says "unsalted" or  "no salt added." Shopping  Buy products labeled as "low-sodium" or "no salt added." Buy fresh foods. Avoid canned foods and pre-made or frozen meals. Avoid canned, cured, or processed meats. Buy breads that have less than 80 mg of sodium per slice. Cooking  Eat more home-cooked food. Try to eat less restaurant, buffet, and fast food. Try not to add salt when you cook. Use salt-free seasonings or herbs instead of table salt or sea salt. Check with your provider or pharmacist before using salt substitutes. Cook with plant-based oils, such as canola, sunflower, or olive oil. Meal planning When eating at a restaurant, ask if your food can be made with less salt or no salt. Avoid dishes labeled as brined, pickled, cured, or smoked. Avoid dishes made with soy sauce, miso, or teriyaki sauce. Avoid foods that have monosodium glutamate (MSG) in them. MSG may be added to some restaurant food, sauces, soups, bouillon, and canned foods. Make meals that can be grilled, baked, poached, roasted, or steamed. These are often made with less sodium. General information Try to limit your sodium  intake to 1,500-2,300 mg each day, or the amount told by your provider. What foods should I eat? Fruits Fresh, frozen, or canned fruit. Fruit juice. Vegetables Fresh or frozen vegetables. "No salt added" canned vegetables. "No salt added" tomato sauce and paste. Low-sodium or reduced-sodium tomato and vegetable juice. Grains Low-sodium cereals, such as oats, puffed wheat and rice, and shredded wheat. Low-sodium crackers. Unsalted rice. Unsalted pasta. Low-sodium bread. Whole grain breads and whole grain pasta. Meats and other proteins Fresh or frozen meat, poultry, seafood, and fish. These should have no added salt. Low-sodium canned tuna and salmon. Unsalted nuts. Dried peas, beans, and lentils without added salt. Unsalted canned beans. Eggs. Unsalted nut butters. Dairy Milk. Soy milk. Cheese that is naturally low  in sodium, such as ricotta cheese, fresh mozzarella, or Swiss cheese. Low-sodium or reduced-sodium cheese. Cream cheese. Yogurt. Seasonings and condiments Fresh and dried herbs and spices. Salt-free seasonings. Low-sodium mustard and ketchup. Sodium-free salad dressing. Sodium-free light mayonnaise. Fresh or refrigerated horseradish. Lemon juice. Vinegar. Other foods Homemade, reduced-sodium, or low-sodium soups. Unsalted popcorn and pretzels. Low-salt or salt-free chips. The items listed above may not be all the foods and drinks you can have. Talk to a dietitian to learn more. What foods should I avoid? Vegetables Sauerkraut, pickled vegetables, and relishes. Olives. [place] fries. Onion rings. Regular canned vegetables, except low-sodium or reduced-sodium items. Regular canned tomato sauce and paste. Regular tomato and vegetable juice. Frozen vegetables in sauces. Grains Instant hot cereals. Bread stuffing, pancake, and biscuit mixes. Croutons. Seasoned rice or pasta mixes. Noodle soup cups. Boxed or frozen macaroni and cheese. Regular salted crackers. Self-rising flour. Meats and other proteins Meat or fish that is salted, canned, smoked, spiced, or pickled. Precooked or cured meat, such as sausages or meat loaves. Tomasa Blase. Ham. Pepperoni. Hot dogs. Corned beef. Chipped beef. Salt pork. Jerky. Pickled herring, anchovies, and sardines. Regular canned tuna. Salted nuts. Dairy Processed cheese and cheese spreads. Hard cheeses. Cheese curds. Blue cheese. Feta cheese. String cheese. Regular cottage cheese. Buttermilk. Canned milk. Fats and oils Salted butter. Regular margarine. Ghee. Bacon fat. Seasonings and condiments Onion salt, garlic salt, seasoned salt, table salt, and sea salt. Canned and packaged gravies. Worcestershire sauce. Tartar sauce. Barbecue sauce. Teriyaki sauce. Soy sauce, including reduced-sodium soy sauce. Steak sauce. Fish sauce. Oyster sauce. Cocktail sauce. Horseradish that you  find on the shelf. Regular ketchup and mustard. Meat flavorings and tenderizers. Bouillon cubes. Hot sauce. Pre-made or packaged marinades. Pre-made or packaged taco seasonings. Relishes. Regular salad dressings. Salsa. Other foods Salted popcorn and pretzels. Corn chips and puffs. Potato and tortilla chips. Canned or dried soups. Pizza. Frozen entrees and pot pies. The items listed above may not be all the foods and drinks you should avoid. Talk to a dietitian to learn more. This information is not intended to replace advice given to you by your health care provider. Make sure you discuss any questions you have with your health care provider. Document Revised: 12/28/2022 Document Reviewed: 12/28/2022 Elsevier Patient Education  2024 Elsevier Inc.   DASH Eating Plan DASH stands for Dietary Approaches to Stop Hypertension. The DASH eating plan is a healthy eating plan that has been shown to: Lower high blood pressure (hypertension). Reduce your risk for type 2 diabetes, heart disease, and stroke. Help with weight loss. What are tips for following this plan? Reading food labels Check food labels for the amount of salt (sodium) per serving. Choose foods with less than 5 percent of the Daily Value (  DV) of sodium. In general, foods with less than 300 milligrams (mg) of sodium per serving fit into this eating plan. To find whole grains, look for the word "whole" as the first word in the ingredient list. Shopping Buy products labeled as "low-sodium" or "no salt added." Buy fresh foods. Avoid canned foods and pre-made or frozen meals. Cooking Try not to add salt when you cook. Use salt-free seasonings or herbs instead of table salt or sea salt. Check with your health care provider or pharmacist before using salt substitutes. Do not fry foods. Cook foods in healthy ways, such as baking, boiling, grilling, roasting, or broiling. Cook using oils that are good for your heart. These include olive, canola,  avocado, soybean, and sunflower oil. Meal planning  Eat a balanced diet. This should include: 4 or more servings of fruits and 4 or more servings of vegetables each day. Try to fill half of your plate with fruits and vegetables. 6-8 servings of whole grains each day. 6 or less servings of lean meat, poultry, or fish each day. 1 oz is 1 serving. A 3 oz (85 g) serving of meat is about the same size as the palm of your hand. One egg is 1 oz (28 g). 2-3 servings of low-fat dairy each day. One serving is 1 cup (237 mL). 1 serving of nuts, seeds, or beans 5 times each week. 2-3 servings of heart-healthy fats. Healthy fats called omega-3 fatty acids are found in foods such as walnuts, flaxseeds, fortified milks, and eggs. These fats are also found in cold-water fish, such as sardines, salmon, and mackerel. Limit how much you eat of: Canned or prepackaged foods. Food that is high in trans fat, such as fried foods. Food that is high in saturated fat, such as fatty meat. Desserts and other sweets, sugary drinks, and other foods with added sugar. Full-fat dairy products. Do not salt foods before eating. Do not eat more than 4 egg yolks a week. Try to eat at least 2 vegetarian meals a week. Eat more home-cooked food and less restaurant, buffet, and fast food. Lifestyle When eating at a restaurant, ask if your food can be made with less salt or no salt. If you drink alcohol: Limit how much you have to: 0-1 drink a day if you are female. 0-2 drinks a day if you are female. Know how much alcohol is in your drink. In the [place] one drink is one 12 oz bottle of beer (355 mL), one 5 oz glass of wine (148 mL), or one 1 oz glass of hard liquor (44 mL). General information Avoid eating more than 2,300 mg of salt a day. If you have hypertension, you may need to reduce your sodium intake to 1,500 mg a day. Work with your provider to stay at a healthy body weight or lose weight. Ask what the best weight range is  for you. On most days of the week, get at least 30 minutes of exercise that causes your heart to beat faster. This may include walking, swimming, or biking. Work with your provider or dietitian to adjust your eating plan to meet your specific calorie needs. What foods should I eat? Fruits All fresh, dried, or frozen fruit. Canned fruits that are in their natural juice and do not have sugar added to them. Vegetables Fresh or frozen vegetables that are raw, steamed, roasted, or grilled. Low-sodium or reduced-sodium tomato and vegetable juice. Low-sodium or reduced-sodium tomato sauce and tomato paste. Low-sodium or reduced-sodium  canned vegetables. Grains Whole-grain or whole-wheat bread. Whole-grain or whole-wheat pasta. Brown rice. Orpah Cobb. Bulgur. Whole-grain and low-sodium cereals. Pita bread. Low-fat, low-sodium crackers. Whole-wheat flour tortillas. Meats and other proteins Skinless chicken or [place]. Ground chicken or [place]. Pork with fat trimmed off. Fish and seafood. Egg whites. Dried beans, peas, or lentils. Unsalted nuts, nut butters, and seeds. Unsalted canned beans. Lean cuts of beef with fat trimmed off. Low-sodium, lean precooked or cured meat, such as sausages or meat loaves. Dairy Low-fat (1%) or fat-free (skim) milk. Reduced-fat, low-fat, or fat-free cheeses. Nonfat, low-sodium ricotta or cottage cheese. Low-fat or nonfat yogurt. Low-fat, low-sodium cheese. Fats and oils Soft margarine without trans fats. Vegetable oil. Reduced-fat, low-fat, or light mayonnaise and salad dressings (reduced-sodium). Canola, safflower, olive, avocado, soybean, and sunflower oils. Avocado. Seasonings and condiments Herbs. Spices. Seasoning mixes without salt. Other foods Unsalted popcorn and pretzels. Fat-free sweets. The items listed above may not be all the foods and drinks you can have. Talk to a dietitian to learn more. What foods should I avoid? Fruits Canned fruit in a light or  heavy syrup. Fried fruit. Fruit in cream or butter sauce. Vegetables Creamed or fried vegetables. Vegetables in a cheese sauce. Regular canned vegetables that are not marked as low-sodium or reduced-sodium. Regular canned tomato sauce and paste that are not marked as low-sodium or reduced-sodium. Regular tomato and vegetable juices that are not marked as low-sodium or reduced-sodium. Rosita Fire. Olives. Grains Baked goods made with fat, such as croissants, muffins, or some breads. Dry pasta or rice meal packs. Meats and other proteins Fatty cuts of meat. Ribs. Fried meat. Tomasa Blase. Bologna, salami, and other precooked or cured meats, such as sausages or meat loaves, that are not lean and low in sodium. Fat from the back of a pig (fatback). Bratwurst. Salted nuts and seeds. Canned beans with added salt. Canned or smoked fish. Whole eggs or egg yolks. Chicken or [place] with skin. Dairy Whole or 2% milk, cream, and half-and-half. Whole or full-fat cream cheese. Whole-fat or sweetened yogurt. Full-fat cheese. Nondairy creamers. Whipped toppings. Processed cheese and cheese spreads. Fats and oils Butter. Stick margarine. Lard. Shortening. Ghee. Bacon fat. Tropical oils, such as coconut, palm kernel, or palm oil. Seasonings and condiments Onion salt, garlic salt, seasoned salt, table salt, and sea salt. Worcestershire sauce. Tartar sauce. Barbecue sauce. Teriyaki sauce. Soy sauce, including reduced-sodium soy sauce. Steak sauce. Canned and packaged gravies. Fish sauce. Oyster sauce. Cocktail sauce. Store-bought horseradish. Ketchup. Mustard. Meat flavorings and tenderizers. Bouillon cubes. Hot sauces. Pre-made or packaged marinades. Pre-made or packaged taco seasonings. Relishes. Regular salad dressings. Other foods Salted popcorn and pretzels. The items listed above may not be all the foods and drinks you should avoid. Talk to a dietitian to learn more. Where to find more information National Heart, Lung, and  Blood Institute (NHLBI): BuffaloDryCleaner.gl American Heart Association (AHA): heart.org Academy of Nutrition and Dietetics: eatright.org National Kidney Foundation (NKF): kidney.org This information is not intended to replace advice given to you by your health care provider. Make sure you discuss any questions you have with your health care provider. Document Revised: 12/28/2022 Document Reviewed: 12/28/2022 Elsevier Patient Education  2024 [employer].

## 2023-09-17 DIAGNOSIS — H938X2 Other specified disorders of left ear: Secondary | ICD-10-CM | POA: Diagnosis not present

## 2023-09-17 DIAGNOSIS — I110 Hypertensive heart disease with heart failure: Secondary | ICD-10-CM | POA: Diagnosis not present

## 2023-09-17 DIAGNOSIS — E119 Type 2 diabetes mellitus without complications: Secondary | ICD-10-CM | POA: Diagnosis not present

## 2023-09-17 DIAGNOSIS — L03012 Cellulitis of left finger: Secondary | ICD-10-CM | POA: Diagnosis not present

## 2023-09-17 DIAGNOSIS — I700 Atherosclerosis of aorta: Secondary | ICD-10-CM | POA: Diagnosis not present

## 2023-09-17 DIAGNOSIS — I5020 Unspecified systolic (congestive) heart failure: Secondary | ICD-10-CM | POA: Diagnosis not present

## 2023-09-17 DIAGNOSIS — Z23 Encounter for immunization: Secondary | ICD-10-CM | POA: Diagnosis not present

## 2023-09-17 DIAGNOSIS — I10 Essential (primary) hypertension: Secondary | ICD-10-CM | POA: Diagnosis not present

## 2023-09-20 ENCOUNTER — Ambulatory Visit (HOSPITAL_COMMUNITY): Payer: PPO | Attending: Nurse Practitioner

## 2023-09-20 DIAGNOSIS — E782 Mixed hyperlipidemia: Secondary | ICD-10-CM | POA: Diagnosis not present

## 2023-09-20 DIAGNOSIS — I25118 Atherosclerotic heart disease of native coronary artery with other forms of angina pectoris: Secondary | ICD-10-CM | POA: Diagnosis not present

## 2023-09-20 DIAGNOSIS — I10 Essential (primary) hypertension: Secondary | ICD-10-CM | POA: Insufficient documentation

## 2023-09-20 DIAGNOSIS — I447 Left bundle-branch block, unspecified: Secondary | ICD-10-CM | POA: Insufficient documentation

## 2023-09-20 DIAGNOSIS — I5022 Chronic systolic (congestive) heart failure: Secondary | ICD-10-CM | POA: Insufficient documentation

## 2023-09-20 DIAGNOSIS — Z01810 Encounter for preprocedural cardiovascular examination: Secondary | ICD-10-CM | POA: Diagnosis not present

## 2023-09-20 LAB — ECHOCARDIOGRAM COMPLETE
Area-P 1/2: 4.44 cm2
S' Lateral: 3.5 cm

## 2023-09-26 ENCOUNTER — Inpatient Hospital Stay
Admission: RE | Admit: 2023-09-26 | Discharge: 2023-09-26 | Disposition: A | Discharge status: Home / Self Care | Payer: PPO | Source: Ambulatory Visit | Attending: Obstetrics & Gynecology | Admitting: Obstetrics & Gynecology

## 2023-09-26 DIAGNOSIS — D120 Benign neoplasm of cecum: Secondary | ICD-10-CM | POA: Diagnosis not present

## 2023-09-26 DIAGNOSIS — E2839 Other primary ovarian failure: Secondary | ICD-10-CM

## 2023-09-26 DIAGNOSIS — M8588 Other specified disorders of bone density and structure, other site: Secondary | ICD-10-CM | POA: Diagnosis not present

## 2023-09-26 DIAGNOSIS — K5730 Diverticulosis of large intestine without perforation or abscess without bleeding: Secondary | ICD-10-CM | POA: Diagnosis not present

## 2023-09-26 DIAGNOSIS — K2930 Chronic superficial gastritis without bleeding: Secondary | ICD-10-CM | POA: Diagnosis not present

## 2023-09-26 DIAGNOSIS — K2950 Unspecified chronic gastritis without bleeding: Secondary | ICD-10-CM | POA: Diagnosis not present

## 2023-09-26 DIAGNOSIS — K259 Gastric ulcer, unspecified as acute or chronic, without hemorrhage or perforation: Secondary | ICD-10-CM | POA: Diagnosis not present

## 2023-09-26 DIAGNOSIS — N958 Other specified menopausal and perimenopausal disorders: Secondary | ICD-10-CM | POA: Diagnosis not present

## 2023-09-26 DIAGNOSIS — R195 Other fecal abnormalities: Secondary | ICD-10-CM | POA: Diagnosis not present

## 2023-09-26 DIAGNOSIS — E349 Endocrine disorder, unspecified: Secondary | ICD-10-CM | POA: Diagnosis not present

## 2023-09-26 DIAGNOSIS — D122 Benign neoplasm of ascending colon: Secondary | ICD-10-CM | POA: Diagnosis not present

## 2023-09-26 DIAGNOSIS — D509 Iron deficiency anemia, unspecified: Secondary | ICD-10-CM | POA: Diagnosis not present

## 2023-10-03 DIAGNOSIS — K2930 Chronic superficial gastritis without bleeding: Secondary | ICD-10-CM | POA: Diagnosis not present

## 2023-10-03 DIAGNOSIS — D122 Benign neoplasm of ascending colon: Secondary | ICD-10-CM | POA: Diagnosis not present

## 2023-10-03 DIAGNOSIS — D120 Benign neoplasm of cecum: Secondary | ICD-10-CM | POA: Diagnosis not present

## 2023-10-31 ENCOUNTER — Other Ambulatory Visit: Payer: Self-pay | Admitting: *Deleted

## 2023-10-31 DIAGNOSIS — E782 Mixed hyperlipidemia: Secondary | ICD-10-CM

## 2023-11-01 ENCOUNTER — Ambulatory Visit: Payer: PPO | Attending: Nurse Practitioner

## 2023-11-01 DIAGNOSIS — E782 Mixed hyperlipidemia: Secondary | ICD-10-CM | POA: Diagnosis not present

## 2023-11-02 ENCOUNTER — Other Ambulatory Visit: Payer: Self-pay

## 2023-11-02 DIAGNOSIS — E782 Mixed hyperlipidemia: Secondary | ICD-10-CM

## 2023-11-02 LAB — HEPATIC FUNCTION PANEL
ALT: 34 [IU]/L — ABNORMAL HIGH (ref 0–32)
AST: 44 [IU]/L — ABNORMAL HIGH (ref 0–40)
Albumin: 4.4 g/dL (ref 3.8–4.8)
Alkaline Phosphatase: 81 [IU]/L (ref 44–121)
Bilirubin Total: 0.3 mg/dL (ref 0.0–1.2)
Bilirubin, Direct: 0.13 mg/dL (ref 0.00–0.40)
Total Protein: 6.6 g/dL (ref 6.0–8.5)

## 2023-11-02 LAB — LIPID PANEL
Chol/HDL Ratio: 3.3 ratio (ref 0.0–4.4)
Cholesterol, Total: 108 mg/dL (ref 100–199)
HDL: 33 mg/dL — ABNORMAL LOW (ref >39)
LDL Chol Calc (NIH): 23 mg/dL (ref 0–99)
Triglycerides: 364 mg/dL — ABNORMAL HIGH (ref 0–149)
VLDL Cholesterol Cal: 52 mg/dL — ABNORMAL HIGH (ref 5–40)

## 2023-11-08 LAB — HEPATIC FUNCTION PANEL

## 2023-11-08 LAB — LIPID PANEL

## 2023-11-23 NOTE — Progress Notes (Signed)
Patient ID: TARMARA AGUIAR                 DOB: Apr 16, 1951                    MRN: [id number]     HPI: JAEDIN MACNAMARA is a 72 y.o. female patient referred to lipid clinic by Robin Searing NP. PMH is significant for HFrEF, hypertension, type 2 diabetes mellitus, obesity, LBBB, and menopausal syndrome.  At her most recent visit at the cardiology clinic on 09/06/23, the patient's triglycerides remained elevated on her fasting lipid panel at 590. At this time her pravastatin 40 mg was discontinued and she was started on rosuvastatin 20 mg. Follow-up fasting lipid panel and liver enzyme tests were ordered at 8 weeks after the medication change. These results were obtained on 11/01/23 showing persistently elevated triglycerides at 364 despite increase of statin intensity. Patient was referred to lipid clinic for therapy optimization.  At this visit...  Questions: --recent fill history for prava 40 and rosuva 20? Plan was to switch prava >rosuva. Filling both? Taking both? --intolerances?  DASH diet = inc intake fresh vegt/fruits, whole grain carbs, fat-free/low-fat dairy, lean protein (fish, poultry), nuts/seeds, beans Red intake of fatty meats, full-fat dairy, sugary beverages  Med options:  fenofibrate (would need to reduce statin intensity) [40-60%] icosapent ethyl (vascepa) [30-40%] omega-3 FA [30-40%] PCSK9i [20-50%]   Current Medications: rosuvastatin 20 mg daily Intolerances: none Risk Factors: diabetes mellitus TG goal: TG <175 mg/dL  Diet:   Exercise:   Family History:   Social History:   Labs: Lipid Panel 10/8723 Cholesterol, Total 100 - 199 mg/dL 016  Triglycerides 0 - 149 mg/dL 010 High   HDL >93 mg/dL 33 Low   VLDL Cholesterol Cal 5 - 40 mg/dL 52 High   LDL Chol Calc (NIH) 0 - 99 mg/dL 23  Chol/HDL Ratio 0.0 - 4.4 ratio 3.3      Latest Ref Rng & Units 11/01/2023    7:51 AM 11/01/2023    7:30 AM 02/05/2022    1:54 AM  Hepatic Function  Total Protein g/dL  CANCELED  6.6  5.5   Albumin  CANCELED  4.4  2.7   AST  CANCELED  44  37   ALT  CANCELED  34  29   Alk Phosphatase  CANCELED  81  50   Total Bilirubin  CANCELED  0.3  0.5   Bilirubin, Direct  CANCELED  0.13        Past Medical History:  Diagnosis Date   Anemia    Diabetes mellitus without complication (HCC)    Type 2   Genital herpes    GERD (gastroesophageal reflux disease)    Hypertension    Interstitial cystitis    Left bundle branch block (LBBB)    chronic   Leukocytosis    Osteoarthritis    Plantar fasciitis     Current Outpatient Medications on File Prior to Visit  Medication Sig Dispense Refill   acetaminophen (TYLENOL) 500 MG tablet Take 500-1,000 mg by mouth every 6 (six) hours as needed (pain.).     allopurinol (ZYLOPRIM) 300 MG tablet Take 300 mg by mouth in the morning.     amLODipine (NORVASC) 10 MG tablet Take 10 mg by mouth in the morning.     aspirin EC 81 MG tablet Take 1 tablet (81 mg total) by mouth daily. Swallow whole. 90 tablet 3   celecoxib (CELEBREX) 200 MG capsule Take  200 mg by mouth daily.     cetirizine (ZYRTEC) 10 MG tablet Take 10 mg by mouth in the morning.     colchicine 0.6 MG tablet TAKE 1 TABLET BY MOUTH TWICE A DAY (Patient taking differently: Take 0.6 mg by mouth as needed.) 30 tablet 0   hydrochlorothiazide (MICROZIDE) 12.5 MG capsule Take 12.5 mg by mouth in the morning.     irbesartan (AVAPRO) 300 MG tablet TAKE 1 TABLET BY MOUTH DAILY 90 tablet 3   iron polysaccharides (FERREX 150) 150 MG capsule Take 150 mg by mouth 2 (two) times daily.     metFORMIN (GLUCOPHAGE) 1000 MG tablet Take 1,000 mg by mouth 2 (two) times daily with a meal.     metoprolol tartrate (LOPRESSOR) 25 MG tablet Take 75 mg by mouth 2 (two) times daily.     Multiple Vitamins-Minerals (EMERGEN-C IMMUNE PLUS) PACK Take 1 packet by mouth 3 (three) times a week.     nystatin cream (MYCOSTATIN) Apply 1 application topically daily as needed for rash.     omeprazole  (PRILOSEC) 40 MG capsule Take 40 mg by mouth in the morning.     ondansetron (ZOFRAN-ODT) 4 MG disintegrating tablet Take 4 mg by mouth every 8 (eight) hours as needed for vomiting or nausea.     Pentosan Polysulfate Sodium 200 MG CPDR Take 200 mg by mouth 2 (two) times daily.     polyvinyl alcohol (LIQUIFILM TEARS) 1.4 % ophthalmic solution Place 1 drop into both eyes as needed for dry eyes.     rosuvastatin (CRESTOR) 20 MG tablet Take 1 tablet (20 mg total) by mouth daily. 30 tablet 6   sucralfate (CARAFATE) 1 g tablet Take 1 g by mouth 4 (four) times daily -  with meals and at bedtime.     triamcinolone cream (KENALOG) 0.1 % Apply 1 Application topically as needed.     trimethoprim (TRIMPEX) 100 MG tablet Take 100 mg by mouth every evening.     valACYclovir (VALTREX) 500 MG tablet Take 500 mg by mouth 2 (two) times daily as needed (outbreaks (blisters)).     No current facility-administered medications on file prior to visit.    Allergies  Allergen Reactions   Penicillins Shortness Of Breath, Itching and Rash   Accupril [Quinapril Hcl] Cough   Januvia [Sitagliptin] Other (See Comments)    thrush   Monistat [Miconazole] Itching and Swelling    Monistat    Tioconazole Rash    Other reaction(s): rash   Zestril [Lisinopril] Cough    Assessment/Plan:  1. Hyperlipidemia -  Reviewed options for lowering LDL cholesterol, including statins, ezetimibe, PCSK9 inhibitors, Nexletol/Nexlizet, and Leqvio. Discussed efficacy, dosing, side effects, and copay information.

## 2023-11-26 ENCOUNTER — Ambulatory Visit: Payer: PPO | Attending: Cardiology | Admitting: Pharmacist

## 2023-11-26 DIAGNOSIS — E782 Mixed hyperlipidemia: Secondary | ICD-10-CM

## 2023-11-26 DIAGNOSIS — E781 Pure hyperglyceridemia: Secondary | ICD-10-CM | POA: Diagnosis not present

## 2023-11-26 MED ORDER — FENOFIBRATE 48 MG PO TABS: 48.0000 mg | ORAL_TABLET | Freq: Every day | ORAL | 3 refills | Status: DC

## 2023-11-26 MED ORDER — ROSUVASTATIN CALCIUM 10 MG PO TABS: 10.0000 mg | ORAL_TABLET | Freq: Every day | ORAL | 3 refills | Status: DC

## 2023-11-26 NOTE — Patient Instructions (Addendum)
Thank you for coming in today to see [place] at the clinic. As we discussed your triglycerides are still elevated above goal of <175. We discussed things that can be done to further reduce your triglycerides including reducing/removing high-fat dairy products (milk, cheese), using butter substitutes to cook, and increasing your weekly walking routine to 15-30 minutes 3-4 times per week. In addition to these steps we will start you on fenofibrate 48 mg tablet once daily. To be cautious we are reducing your Crestor (rosuvastatin) from 20 mg tablet to 10 mg tablet daily as using this medication with fenofibrate can be hard on the liver. Please come in to have your labs checked in 2-3 months. Please call us in the mean time if you have any questions.  It was great seeing you!  Wilmer Floor, PharmD PGY2 Cardiology Pharmacy Resident

## 2024-01-02 ENCOUNTER — Telehealth: Payer: Self-pay | Admitting: Nurse Practitioner

## 2024-01-02 NOTE — Telephone Encounter (Signed)
 Pt c/o medication issue:  1. Name of Medication:   rosuvastatin  (CRESTOR ) 10 MG tablet  fenofibrate  (TRICOR ) 48 MG tablet   2. How are you currently taking this medication (dosage and times per day)?   As prescribed  3. Are you having a reaction (difficulty breathing--STAT)?   4. What is your medication issue?   Patient wants to know if she should be taking both of these medications.

## 2024-01-02 NOTE — Telephone Encounter (Signed)
 Spoke with patient and she is aware that she is supoosed to take crestor and fenofibrate per her last visit with pharmD on 12/-2/24

## 2024-01-07 ENCOUNTER — Other Ambulatory Visit: Payer: Self-pay | Admitting: Gastroenterology

## 2024-01-07 DIAGNOSIS — R1013 Epigastric pain: Secondary | ICD-10-CM

## 2024-01-29 ENCOUNTER — Ambulatory Visit
Admission: RE | Admit: 2024-01-29 | Discharge: 2024-01-29 | Disposition: A | Discharge status: Home / Self Care | Payer: PPO | Source: Ambulatory Visit | Attending: Gastroenterology | Admitting: Gastroenterology

## 2024-01-29 DIAGNOSIS — R1013 Epigastric pain: Secondary | ICD-10-CM

## 2024-01-29 MED ORDER — IOPAMIDOL (ISOVUE-300) INJECTION 61%
500.0000 mL | Freq: Once | INTRAVENOUS | Status: AC | PRN
  Administered 2024-01-29: 100 mL via INTRAVENOUS

## 2024-02-05 DIAGNOSIS — E782 Mixed hyperlipidemia: Secondary | ICD-10-CM | POA: Diagnosis not present

## 2024-02-05 LAB — LIPID PANEL
Chol/HDL Ratio: 3.8 {ratio} (ref 0.0–4.4)
Cholesterol, Total: 132 mg/dL (ref 100–199)
HDL: 35 mg/dL — ABNORMAL LOW (ref >39)
LDL Chol Calc (NIH): 51 mg/dL (ref 0–99)
Triglycerides: 297 mg/dL — ABNORMAL HIGH (ref 0–149)
VLDL Cholesterol Cal: 46 mg/dL — ABNORMAL HIGH (ref 5–40)

## 2024-02-05 LAB — HEPATIC FUNCTION PANEL
ALT: 46 [IU]/L — ABNORMAL HIGH (ref 0–32)
AST: 44 [IU]/L — ABNORMAL HIGH (ref 0–40)
Albumin: 4.5 g/dL (ref 3.8–4.8)
Alkaline Phosphatase: 87 [IU]/L (ref 44–121)
Bilirubin Total: 0.3 mg/dL (ref 0.0–1.2)
Bilirubin, Direct: 0.16 mg/dL (ref 0.00–0.40)
Total Protein: 6.8 g/dL (ref 6.0–8.5)

## 2024-02-12 ENCOUNTER — Encounter: Payer: Self-pay | Admitting: Pharmacist

## 2024-02-12 ENCOUNTER — Telehealth: Payer: Self-pay | Admitting: Pharmacist

## 2024-02-12 NOTE — Telephone Encounter (Signed)
 Discussed lab results with pt. TG better, but still elevated. LDL-C still at goal after decreasing rosuvastatin and adding fenofibrate. AST unchanged at 44, ALT 34>46  She is seeing GI on 3/4. CT did show some fatty liver We discussed adding Vascepa. She would like to think about it and get back to me.

## 2024-02-19 DIAGNOSIS — D509 Iron deficiency anemia, unspecified: Secondary | ICD-10-CM | POA: Diagnosis not present

## 2024-02-26 DIAGNOSIS — K7460 Unspecified cirrhosis of liver: Secondary | ICD-10-CM | POA: Diagnosis not present

## 2024-03-04 DIAGNOSIS — D509 Iron deficiency anemia, unspecified: Secondary | ICD-10-CM | POA: Diagnosis not present

## 2024-03-12 DIAGNOSIS — K118 Other diseases of salivary glands: Secondary | ICD-10-CM | POA: Diagnosis not present

## 2024-03-12 DIAGNOSIS — Z6841 Body Mass Index (BMI) 40.0 and over, adult: Secondary | ICD-10-CM | POA: Diagnosis not present

## 2024-03-17 ENCOUNTER — Other Ambulatory Visit (HOSPITAL_COMMUNITY): Payer: Self-pay | Admitting: Family Medicine

## 2024-03-17 DIAGNOSIS — K118 Other diseases of salivary glands: Secondary | ICD-10-CM

## 2024-03-18 ENCOUNTER — Encounter: Payer: Self-pay | Admitting: Cardiology

## 2024-03-18 ENCOUNTER — Ambulatory Visit (HOSPITAL_COMMUNITY)
Admission: RE | Admit: 2024-03-18 | Discharge: 2024-03-18 | Disposition: A | Discharge status: Home / Self Care | Source: Ambulatory Visit | Attending: Family Medicine | Admitting: Family Medicine

## 2024-03-18 ENCOUNTER — Ambulatory Visit: Admitting: Cardiology

## 2024-03-18 VITALS — BP 134/62 | HR 80 | Resp 16 | Ht 61.0 in | Wt 214.6 lb

## 2024-03-18 DIAGNOSIS — I2584 Coronary atherosclerosis due to calcified coronary lesion: Secondary | ICD-10-CM | POA: Insufficient documentation

## 2024-03-18 DIAGNOSIS — I10 Essential (primary) hypertension: Secondary | ICD-10-CM | POA: Diagnosis not present

## 2024-03-18 DIAGNOSIS — Z6841 Body Mass Index (BMI) 40.0 and over, adult: Secondary | ICD-10-CM

## 2024-03-18 DIAGNOSIS — E66813 Obesity, class 3: Secondary | ICD-10-CM | POA: Diagnosis not present

## 2024-03-18 DIAGNOSIS — E782 Mixed hyperlipidemia: Secondary | ICD-10-CM | POA: Insufficient documentation

## 2024-03-18 DIAGNOSIS — E781 Pure hyperglyceridemia: Secondary | ICD-10-CM

## 2024-03-18 DIAGNOSIS — I5032 Chronic diastolic (congestive) heart failure: Secondary | ICD-10-CM | POA: Diagnosis not present

## 2024-03-18 DIAGNOSIS — I2510 Atherosclerotic heart disease of native coronary artery without angina pectoris: Secondary | ICD-10-CM

## 2024-03-18 DIAGNOSIS — K118 Other diseases of salivary glands: Secondary | ICD-10-CM | POA: Diagnosis not present

## 2024-03-18 MED ORDER — ICOSAPENT ETHYL 1 G PO CAPS: 2.0000 g | ORAL_CAPSULE | Freq: Two times a day (BID) | ORAL | 3 refills | Status: DC

## 2024-03-18 NOTE — Progress Notes (Signed)
 Cardiology Office Note:  .   Date:  03/18/2024  ID:  Venancio Poisson, DOB 1951-06-22, MRN [id number] PCP:  Elias Else, MD  Former Cardiology Providers: Dr. Lance Muss [facility] Providers Cardiologist:  Tessa Lerner, DO , [facility] (established care 03/18/24) Electrophysiologist:  None  Click to update primary MD,subspecialty MD or APP then REFRESH:1}    Chief Complaint  Patient presents with   Hypertension   Hyperlipidemia   <9>Chronic systolic heart failure   Follow-up    History of Present Illness: .   Diane Mayo is a 73 y.o. Caucasian female whose past medical history and cardiovascular risk factors includes: HfmrEF, severe coronary artery calcification, mild nonobstructive CAD, hypertension, left bundle branch block, diabetes mellitus type 2, anemia, mitral regurgitation, coronary artery calcification, family history of CAD, postmenopausal female.  Formally under the care of Dr. Lance Muss who last saw Diane Mayo back in July 2023. I am seeing her for the first time to re-establishing care.  He was last seen by Alden Server in 2024.  Patient has known history of severe CAC, mild nonobstructive CAD based on coronary CTA and chronic heart failure with mildly reduced ejection fraction.  Patient is accompanied by her husband at today's office visit.  Since last office visit she denies any anginal chest pain or heart failure symptoms.  She does have shortness of breath with over exertional activities but this is chronic and stable.  Overall functional capacity remains stable.  No hospitalizations or urgent care visits for cardiovascular reasons.  When she saw Robin Searing nurse practitioner back in September 2024 her LDL was 42 mg/dL; however, triglycerides were 590 mg/dL.  She was transition from pravastatin to Crestor and follow-up labs from February 2025 illustrate the LDL to be 51 mg/dL and triglycerides are now 297 mg/dL.  Patient does not endorse drinking alcohol  but does endorse a diet high in carbohydrates.  However she is making a conscious effort to reduce foods that are high in triglycerides.   Review of Systems: .   Review of Systems  Cardiovascular:  Positive for dyspnea on exertion (with overexertion). Negative for chest pain, claudication, irregular heartbeat, leg swelling, near-syncope, orthopnea, palpitations, paroxysmal nocturnal dyspnea and syncope.  Respiratory:  Negative for shortness of breath.   Hematologic/Lymphatic: Negative for bleeding problem.    Studies Reviewed:   EKG: EKG Interpretation Date/Time:  Tuesday March 18 2024 14:37:06 EDT Ventricular Rate:  73 PR Interval:  294 QRS Duration:  170 QT Interval:  476 QTC Calculation: 524 R Axis:   -57  Text Interpretation: Sinus rhythm with 1st degree A-V block with Premature atrial complexes Left axis deviation Left bundle branch block When compared with ECG of 06-Sep-2023 09:07, Premature atrial complexes are now Present Confirmed by Tessa Lerner ([id number]) on 03/18/2024 2:55:22 PM  Echocardiogram: April 2023: LVEF 45-50%  September 2024: LVEF 50-55%, grade 2 diastolic dysfunction, right ventricular size and function normal, no mitral valve regurgitation, estimated RAP 3 mmHg  Coronary CTA: April 2023 1. Coronary calcium score of 457. This was 92 percentile for age-,sex, and race-matched controls.   2. Normal coronary origin with right dominance.   3. Mild CAD in the LAD and diagonal as outlined.   4. Aortic atherosclerosis.   RECOMMENDATIONS: CAD-RADS 2: Mild non-obstructive CAD (25-49%). Consider non-atherosclerotic causes of chest pain. Consider preventive therapy and risk factor modification.  RADIOLOGY: NA  Risk Assessment/Calculations:   The 10-year ASCVD risk score (Arnett DK, et al., 2019) is: 26.5%  Values used to calculate the score:     Age: 73 years     Sex: Female     Is Non-Hispanic African American: No     Diabetic: Yes     Tobacco smoker: No      Systolic Blood Pressure: 134 mmHg     Is BP treated: Yes     HDL Cholesterol: 35 mg/dL     Total Cholesterol: 132 mg/dL  Labs:       Latest Ref Rng & Units 10/11/2022   11:53 AM 02/07/2022    2:53 AM 02/06/2022    3:02 AM  CBC  WBC 4.0 - 10.5 K/uL 11.8  11.6  12.6   Hemoglobin 12.0 - 15.0 g/dL 16.1  8.7  7.0   Hematocrit 36.0 - 46.0 % 35.8  26.9  21.9   Platelets 150 - 400 K/uL 313  287  231        Latest Ref Rng & Units 10/11/2022   11:53 AM 04/17/2022   10:40 AM 02/06/2022    3:02 AM  BMP  Glucose 70 - 99 mg/dL 096  045  409   BUN 8 - 23 mg/dL 21  10  17    Creatinine 0.44 - 1.00 mg/dL 8.11  9.14  7.82   BUN/Creat Ratio 12 - 28  12    Sodium 135 - 145 mmol/L 139  142  136   Potassium 3.5 - 5.1 mmol/L 4.6  4.4  4.0   Chloride 98 - 111 mmol/L 105  102  104   CO2 22 - 32 mmol/L 24  20  22    Calcium 8.9 - 10.3 mg/dL 9.8  9.8  8.3       Latest Ref Rng & Units 02/05/2024    9:39 AM 11/01/2023    7:51 AM 11/01/2023    7:30 AM  CMP  Total Protein 6.0 - 8.5 g/dL 6.8  CANCELED  6.6   Total Bilirubin 0.0 - 1.2 mg/dL 0.3  CANCELED  0.3   Alkaline Phos 44 - 121 IU/L 87  CANCELED  81   AST 0 - 40 IU/L 44  CANCELED  44   ALT 0 - 32 IU/L 46  CANCELED  34     Lab Results  Component Value Date   CHOL 132 02/05/2024   HDL 35 (L) 02/05/2024   LDLCALC 51 02/05/2024   TRIG 297 (H) 02/05/2024   CHOLHDL 3.8 02/05/2024   No results for input(s): "LIPOA" in the last 8760 hours. No components found for: "NTPROBNP" No results for input(s): "PROBNP" in the last 8760 hours. No results for input(s): "TSH" in the last 8760 hours.  Physical Exam:    Today's Vitals   03/18/24 1434  BP: 134/62  Pulse: 80  Resp: 16  SpO2: 97%  Weight: 214 lb 9.6 oz (97.3 kg)  Height: 5\' 1"  (1.549 m)   Body mass index is 40.55 kg/m. Wt Readings from Last 3 Encounters:  03/18/24 214 lb 9.6 oz (97.3 kg)  09/06/23 211 lb 9.6 oz (96 kg)  10/16/22 212 lb 1.3 oz (96.2 kg)    Physical Exam   Constitutional: No distress.  hemodynamically stable  Neck: No JVD present.  Cardiovascular: Normal rate, regular rhythm, S1 normal and S2 normal. Exam reveals no gallop, no S3 and no S4.  No murmur heard. Pulmonary/Chest: Effort normal and breath sounds normal. No stridor. She has no wheezes. She has no rales.  Musculoskeletal:  General: No edema.     Cervical back: Neck supple.  Skin: Skin is warm.     Impression & Recommendation(s):  Impression:   ICD-10-CM   1. Mixed hyperlipidemia  E78.2 icosapent Ethyl (VASCEPA) 1 g capsule    Comprehensive metabolic panel    Lipid panel    Lipid panel    Comprehensive metabolic panel    2. Pure hypertriglyceridemia  E78.1 icosapent Ethyl (VASCEPA) 1 g capsule    Comprehensive metabolic panel    Lipid panel    Lipid panel    Comprehensive metabolic panel    3. Coronary atherosclerosis due to calcified coronary lesion  I25.10 icosapent Ethyl (VASCEPA) 1 g capsule   I25.84 Comprehensive metabolic panel    Lipid panel    Lipid panel    Comprehensive metabolic panel    4. Heart failure with improved ejection fraction (HFimpEF) (HCC)  I50.32 EKG 12-Lead    5. Essential hypertension  I10 EKG 12-Lead    6. Class 3 severe obesity due to excess calories with serious comorbidity and body mass index (BMI) of 40.0 to 44.9 in adult [facility])  M01.027    E66.01    Z68.41        Recommendation(s):  Mixed hyperlipidemia Continue Crestor 10 mg p.o. daily. Does not endorse myalgias. LDL as of February 2025 within acceptable range at 51 mg/dL  Pure hypertriglyceridemia Chronic, improving Triglycerides has been as high as 590 mg/dL. After initiating fenofibrate 48 mg p.o. daily triglyceride levels have improved to 297 mg/dL Given her severe coronary artery calcification, 10-year risk of ASCVD greater than 20%, emphasize importance of improving her triglyceride levels.  Will start Vascepa 2 g p.o. twice daily and repeat labs in 6 weeks to  reevaluate therapy  Coronary atherosclerosis due to calcified coronary lesion Denies anginal chest pain. Coronary CTA results reviewed. Continue aspirin and statin therapy. Continue hypertriglyceridemia management as discussed above Reemphasized the importance of secondary prevention with focus on improving her modifiable cardiovascular risk factors such as glycemic control, lipid management, blood pressure control, weight loss.  Heart failure with improved ejection fraction (HFimpEF) (HCC) LVEF is improved when compared to 2023. Continue Avapro 300 mg p.o. daily. Continue hydrochlorothiazide 12.5 mg p.o. every morning. Continue amlodipine 10 mg p.o. every morning. Continue Lopressor 25 mg 3 tabs p.o. twice daily, discussed transitioning her to Toprol-XL.  Patient states that she has a lot of tablets for her current dose of metoprolol and would like to continue therapy she will call us when due to refill.   Essential hypertension Office blood pressures are well-controlled. Recommended consolidating her antihypertensive medications, patient states that she will consider it when she is due for refills. Reemphasized importance of low-salt diet.  Class 3 severe obesity due to excess calories with serious comorbidity and body mass index (BMI) of 40.0 to 44.9 in adult [facility]) Body mass index is 40.55 kg/m. I reviewed with her importance of diet, regular physical activity/exercise, weight loss.   Patient is educated on the importance of increasing physical activity gradually as tolerated with a goal of moderate intensity exercise for 30 minutes a day 5 days a week.   Orders Placed:  Orders Placed This Encounter  Procedures   Comprehensive metabolic panel    Standing Status:   Future    Number of Occurrences:   1    Expected Date:   04/29/2024    Expiration Date:   03/18/2025   Lipid panel    Standing Status:   Future  Number of Occurrences:   1    Expected Date:   04/29/2024    Expiration  Date:   03/18/2025   EKG 12-Lead     Final Medication List:    Meds ordered this encounter  Medications   icosapent Ethyl (VASCEPA) 1 g capsule    Sig: Take 2 capsules (2 g total) by mouth 2 (two) times daily.    Dispense:  120 capsule    Refill:  3    Medications Discontinued During This Encounter  Medication Reason   aspirin EC 81 MG tablet Patient Preference   celecoxib (CELEBREX) 200 MG capsule Discontinued by provider   Multiple Vitamins-Minerals (EMERGEN-C IMMUNE PLUS) PACK Patient Preference   omeprazole (PRILOSEC) 40 MG capsule Change in therapy   ondansetron (ZOFRAN-ODT) 4 MG disintegrating tablet Patient Preference     Current Outpatient Medications:    acetaminophen (TYLENOL) 500 MG tablet, Take 500-1,000 mg by mouth every 6 (six) hours as needed (pain.)., Disp: , Rfl:    allopurinol (ZYLOPRIM) 300 MG tablet, Take 300 mg by mouth in the morning., Disp: , Rfl:    amLODipine (NORVASC) 10 MG tablet, Take 10 mg by mouth in the morning., Disp: , Rfl:    cetirizine (ZYRTEC) 10 MG tablet, Take 10 mg by mouth in the morning., Disp: , Rfl:    colchicine 0.6 MG tablet, TAKE 1 TABLET BY MOUTH TWICE A DAY (Patient taking differently: Take 0.6 mg by mouth as needed.), Disp: 30 tablet, Rfl: 0   fenofibrate (TRICOR) 48 MG tablet, Take 1 tablet (48 mg total) by mouth daily., Disp: 90 tablet, Rfl: 3   hydrochlorothiazide (MICROZIDE) 12.5 MG capsule, Take 12.5 mg by mouth in the morning., Disp: , Rfl:    icosapent Ethyl (VASCEPA) 1 g capsule, Take 2 capsules (2 g total) by mouth 2 (two) times daily., Disp: 120 capsule, Rfl: 3   irbesartan (AVAPRO) 300 MG tablet, TAKE 1 TABLET BY MOUTH DAILY, Disp: 90 tablet, Rfl: 3   iron polysaccharides (FERREX 150) 150 MG capsule, Take 150 mg by mouth 2 (two) times daily., Disp: , Rfl:    metFORMIN (GLUCOPHAGE) 1000 MG tablet, Take 1,000 mg by mouth 2 (two) times daily with a meal., Disp: , Rfl:    metoprolol tartrate (LOPRESSOR) 25 MG tablet, Take 75 mg  by mouth 2 (two) times daily., Disp: , Rfl:    nystatin cream (MYCOSTATIN), Apply 1 application topically daily as needed for rash., Disp: , Rfl:    pantoprazole (PROTONIX) 40 MG tablet, Take 40 mg by mouth daily., Disp: , Rfl:    Pentosan Polysulfate Sodium 200 MG CPDR, Take 200 mg by mouth 2 (two) times daily., Disp: , Rfl:    polyvinyl alcohol (LIQUIFILM TEARS) 1.4 % ophthalmic solution, Place 1 drop into both eyes as needed for dry eyes., Disp: , Rfl:    rosuvastatin (CRESTOR) 10 MG tablet, Take 1 tablet (10 mg total) by mouth daily., Disp: 90 tablet, Rfl: 3   sucralfate (CARAFATE) 1 g tablet, Take 1 g by mouth 4 (four) times daily -  with meals and at bedtime., Disp: , Rfl:    triamcinolone cream (KENALOG) 0.1 %, Apply 1 Application topically as needed., Disp: , Rfl:    trimethoprim (TRIMPEX) 100 MG tablet, Take 100 mg by mouth every evening., Disp: , Rfl:    valACYclovir (VALTREX) 500 MG tablet, Take 500 mg by mouth 2 (two) times daily as needed (outbreaks (blisters))., Disp: , Rfl:   Consent:   NA  Disposition:   1 year follow-up sooner if needed   Her questions and concerns were addressed to her satisfaction. She voices understanding of the recommendations provided during this encounter.    Signed, Tessa Lerner, DO, [facility]   [facility]  [address] [place], [place] [address] 03/18/2024 7:26 PM

## 2024-03-18 NOTE — Patient Instructions (Addendum)
 Medication Instructions:  Your physician has recommended you make the following change in your medication:   START Vascepa 2 g  twice daily   *If you need a refill on your cardiac medications before your next appointment, please call your pharmacy*  Lab Work: To be completed in 6 weeks: FASTING lipid panel and CMP (approximately 04/29/24)  If you have labs (blood work) drawn today and your tests are completely normal, you will receive your results only by: MyChart Message (if you have MyChart) OR A paper copy in the mail If you have any lab test that is abnormal or we need to change your treatment, we will call you to review the results.  Testing/Procedures: None ordered today.  Follow-Up: At [facility], you and your health needs are our priority.  As part of our continuing mission to provide you with exceptional heart care, we have created designated Provider Care Teams.  These Care Teams include your primary Cardiologist (physician) and Advanced Practice Providers (APPs -  Physician Assistants and Nurse Practitioners) who all work together to provide you with the care you need, when you need it.  We recommend signing up for the patient portal called "MyChart".  Sign up information is provided on this After Visit Summary.  MyChart is used to connect with patients for Virtual Visits (Telemedicine).  Patients are able to view lab/test results, encounter notes, upcoming appointments, etc.  Non-urgent messages can be sent to your provider as well.   To learn more about what you can do with MyChart, go to [email or web address].    Your next appointment:   1 year(s)  The format for your next appointment:   In Person  Provider:   Tessa Lerner, DO {

## 2024-03-21 ENCOUNTER — Encounter: Payer: Self-pay | Admitting: Cardiology

## 2024-03-28 ENCOUNTER — Other Ambulatory Visit: Payer: Self-pay | Admitting: Family Medicine

## 2024-03-28 DIAGNOSIS — K118 Other diseases of salivary glands: Secondary | ICD-10-CM

## 2024-04-09 ENCOUNTER — Ambulatory Visit
Admission: RE | Admit: 2024-04-09 | Discharge: 2024-04-09 | Disposition: A | Discharge status: Home / Self Care | Source: Ambulatory Visit | Attending: Family Medicine | Admitting: Family Medicine

## 2024-04-09 DIAGNOSIS — R221 Localized swelling, mass and lump, neck: Secondary | ICD-10-CM | POA: Diagnosis not present

## 2024-04-09 DIAGNOSIS — K118 Other diseases of salivary glands: Secondary | ICD-10-CM

## 2024-04-09 MED ORDER — IOPAMIDOL (ISOVUE-300) INJECTION 61%
75.0000 mL | Freq: Once | INTRAVENOUS | Status: AC | PRN
  Administered 2024-04-09: 75 mL via INTRAVENOUS

## 2024-04-10 DIAGNOSIS — E119 Type 2 diabetes mellitus without complications: Secondary | ICD-10-CM | POA: Diagnosis not present

## 2024-04-10 DIAGNOSIS — I10 Essential (primary) hypertension: Secondary | ICD-10-CM | POA: Diagnosis not present

## 2024-04-10 DIAGNOSIS — M1000 Idiopathic gout, unspecified site: Secondary | ICD-10-CM | POA: Diagnosis not present

## 2024-04-10 DIAGNOSIS — E782 Mixed hyperlipidemia: Secondary | ICD-10-CM | POA: Diagnosis not present

## 2024-04-15 DIAGNOSIS — E782 Mixed hyperlipidemia: Secondary | ICD-10-CM | POA: Diagnosis not present

## 2024-04-15 DIAGNOSIS — E119 Type 2 diabetes mellitus without complications: Secondary | ICD-10-CM | POA: Diagnosis not present

## 2024-04-15 DIAGNOSIS — K118 Other diseases of salivary glands: Secondary | ICD-10-CM | POA: Diagnosis not present

## 2024-04-15 DIAGNOSIS — I5032 Chronic diastolic (congestive) heart failure: Secondary | ICD-10-CM | POA: Diagnosis not present

## 2024-04-15 DIAGNOSIS — Z1331 Encounter for screening for depression: Secondary | ICD-10-CM | POA: Diagnosis not present

## 2024-04-15 DIAGNOSIS — Z0000 Encounter for general adult medical examination without abnormal findings: Secondary | ICD-10-CM | POA: Diagnosis not present

## 2024-04-15 DIAGNOSIS — M85852 Other specified disorders of bone density and structure, left thigh: Secondary | ICD-10-CM | POA: Diagnosis not present

## 2024-04-15 DIAGNOSIS — I25119 Atherosclerotic heart disease of native coronary artery with unspecified angina pectoris: Secondary | ICD-10-CM | POA: Diagnosis not present

## 2024-04-15 DIAGNOSIS — I10 Essential (primary) hypertension: Secondary | ICD-10-CM | POA: Diagnosis not present

## 2024-04-15 DIAGNOSIS — M1000 Idiopathic gout, unspecified site: Secondary | ICD-10-CM | POA: Diagnosis not present

## 2024-04-15 DIAGNOSIS — N3010 Interstitial cystitis (chronic) without hematuria: Secondary | ICD-10-CM | POA: Diagnosis not present

## 2024-05-14 DIAGNOSIS — E782 Mixed hyperlipidemia: Secondary | ICD-10-CM | POA: Diagnosis not present

## 2024-05-15 LAB — COMPREHENSIVE METABOLIC PANEL WITH GFR
ALT: 39 IU/L — ABNORMAL HIGH (ref 0–32)
AST: 55 IU/L — ABNORMAL HIGH (ref 0–40)
Albumin: 4.5 g/dL (ref 3.8–4.8)
Alkaline Phosphatase: 86 IU/L (ref 44–121)
BUN/Creatinine Ratio: 19 (ref 12–28)
BUN: 19 mg/dL (ref 8–27)
Bilirubin Total: 0.2 mg/dL (ref 0.0–1.2)
CO2: 16 mmol/L — ABNORMAL LOW (ref 20–29)
Calcium: 9.8 mg/dL (ref 8.7–10.3)
Chloride: 104 mmol/L (ref 96–106)
Creatinine, Ser: 1 mg/dL (ref 0.57–1.00)
Globulin, Total: 2 g/dL (ref 1.5–4.5)
Glucose: 148 mg/dL — ABNORMAL HIGH (ref 70–99)
Potassium: 5 mmol/L (ref 3.5–5.2)
Sodium: 139 mmol/L (ref 134–144)
Total Protein: 6.5 g/dL (ref 6.0–8.5)
eGFR: 60 mL/min/{1.73_m2} (ref >59)

## 2024-05-15 LAB — LIPID PANEL
Chol/HDL Ratio: 3.2 ratio (ref 0.0–4.4)
Cholesterol, Total: 104 mg/dL (ref 100–199)
HDL: 33 mg/dL — ABNORMAL LOW (ref >39)
LDL Chol Calc (NIH): 31 mg/dL (ref 0–99)
Triglycerides: 267 mg/dL — ABNORMAL HIGH (ref 0–149)
VLDL Cholesterol Cal: 40 mg/dL (ref 5–40)

## 2024-05-18 ENCOUNTER — Ambulatory Visit: Payer: Self-pay | Admitting: Cardiology

## 2024-06-17 DIAGNOSIS — K7460 Unspecified cirrhosis of liver: Secondary | ICD-10-CM | POA: Diagnosis not present

## 2024-06-17 DIAGNOSIS — K257 Chronic gastric ulcer without hemorrhage or perforation: Secondary | ICD-10-CM | POA: Diagnosis not present

## 2024-06-17 DIAGNOSIS — D649 Anemia, unspecified: Secondary | ICD-10-CM | POA: Diagnosis not present

## 2024-06-17 DIAGNOSIS — R198 Other specified symptoms and signs involving the digestive system and abdomen: Secondary | ICD-10-CM | POA: Diagnosis not present

## 2024-06-17 DIAGNOSIS — Z9049 Acquired absence of other specified parts of digestive tract: Secondary | ICD-10-CM | POA: Diagnosis not present

## 2024-06-17 DIAGNOSIS — K219 Gastro-esophageal reflux disease without esophagitis: Secondary | ICD-10-CM | POA: Diagnosis not present

## 2024-07-08 DIAGNOSIS — N3010 Interstitial cystitis (chronic) without hematuria: Secondary | ICD-10-CM | POA: Diagnosis not present

## 2024-07-09 DIAGNOSIS — Z961 Presence of intraocular lens: Secondary | ICD-10-CM | POA: Diagnosis not present

## 2024-07-09 DIAGNOSIS — H52223 Regular astigmatism, bilateral: Secondary | ICD-10-CM | POA: Diagnosis not present

## 2024-07-09 DIAGNOSIS — H524 Presbyopia: Secondary | ICD-10-CM | POA: Diagnosis not present

## 2024-07-09 DIAGNOSIS — Z79899 Other long term (current) drug therapy: Secondary | ICD-10-CM | POA: Diagnosis not present

## 2024-07-09 DIAGNOSIS — H5203 Hypermetropia, bilateral: Secondary | ICD-10-CM | POA: Diagnosis not present

## 2024-07-09 DIAGNOSIS — H35033 Hypertensive retinopathy, bilateral: Secondary | ICD-10-CM | POA: Diagnosis not present

## 2024-07-09 DIAGNOSIS — E119 Type 2 diabetes mellitus without complications: Secondary | ICD-10-CM | POA: Diagnosis not present

## 2024-07-09 DIAGNOSIS — I10 Essential (primary) hypertension: Secondary | ICD-10-CM | POA: Diagnosis not present

## 2024-07-16 ENCOUNTER — Ambulatory Visit (INDEPENDENT_AMBULATORY_CARE_PROVIDER_SITE_OTHER): Admitting: Otolaryngology

## 2024-07-16 ENCOUNTER — Encounter (INDEPENDENT_AMBULATORY_CARE_PROVIDER_SITE_OTHER): Payer: Self-pay | Admitting: Otolaryngology

## 2024-07-16 ENCOUNTER — Encounter (HOSPITAL_COMMUNITY): Payer: Self-pay

## 2024-07-16 VITALS — BP 131/75 | HR 73 | Ht 60.0 in | Wt 208.0 lb

## 2024-07-16 DIAGNOSIS — R221 Localized swelling, mass and lump, neck: Secondary | ICD-10-CM

## 2024-07-16 NOTE — Progress Notes (Signed)
 Dear Dr. Austin, Here is my assessment for our mutual patient, Diane Mayo. Thank you for allowing me the opportunity to care for your patient. Please do not hesitate to contact me should you have any other questions. Sincerely, Dr. Eldora Blanch  Otolaryngology Clinic Note Referring provider: Dr. Austin HPI:  Diane Mayo is a 73 y.o. female kindly referred by Dr. Austin for evaluation of right neck  Initial visit (06/2024):  Patient reports: noticed in January incidentally. Maybe some bigger. Not tender, some pressure sensation. Warm compress does help. No antecedent event including URI. No cat scratches, animal exposures, fevers, B symptoms. Patient otherwise denies: - dysphagia, odynophagia, unintentional weight loss - changes in voice, shortness of breath, hemoptysis - ear pain, other neck masses - No skin cancers or face or neck  No biopsies of the area  ENT Surgery: no Personal or FHx of bleeding dz or anesthesia difficulty: no  GLP-1: no AP/AC: no  Tobacco: former, 5 year. Alcohol : no significant use.  PMHx: HTN, HLD, Gout, DM, HF  Independent Review of Additional Tests or Records:  Labs CMP 05/14/2024: BUN/Cr 19/1 CT Neck 04/09/2024 and US  03/18/2024 independently interpreted: Noted right level 2 cystic lesion - does not appear related to parotid, no parotid nodes; query small right submandibular gland nodule; no other significant LAD identified; prominent lingual tonsils; query small hypopharynx/AE fold asymmetry (just effacement?)  Dr. Austin referral notes (05/05/2024): right neck mass, possible branchial cleft v/s cystic LN; ref to ENT PMH/Meds/All/SocHx/FamHx/ROS:   Past Medical History:  Diagnosis Date   Anemia    Diabetes mellitus without complication (HCC)    Type 2   Genital herpes    GERD (gastroesophageal reflux disease)    Hypertension    Interstitial cystitis    Left bundle branch block (LBBB)    chronic   Leukocytosis    Osteoarthritis    Plantar fasciitis       Past Surgical History:  Procedure Laterality Date   BACK SURGERY     CARPAL TUNNEL RELEASE Bilateral    CESAREAN SECTION     twice 1978 and 1987   DILATION AND CURETTAGE OF UTERUS  1975   EYE SURGERY Bilateral 2021   cataracts removed   SHOULDER SURGERY Right 2012   rotator cuff repair   TUBAL LIGATION  1987    Family History  Problem Relation Age of Onset   Hypertension Mother    Diabetes Mother    Other Mother        hay fever   Pneumonia Mother        aspiration   Gout Father    Skin cancer Father    Heart attack Father    Kidney failure Father    Hypertension Father    CVA Father    CAD Father    Asthma Brother    Other Brother        hay fever   Diabetes type II Maternal Grandfather    Alcoholism Brother      Social Connections: Not on file      Current Outpatient Medications:    acetaminophen  (TYLENOL ) 500 MG tablet, Take 500-1,000 mg by mouth every 6 (six) hours as needed (pain.)., Disp: , Rfl:    allopurinol  (ZYLOPRIM ) 300 MG tablet, Take 300 mg by mouth in the morning., Disp: , Rfl:    amLODipine  (NORVASC ) 10 MG tablet, Take 10 mg by mouth in the morning., Disp: , Rfl:    cetirizine (ZYRTEC) 10 MG tablet, Take 10  mg by mouth in the morning., Disp: , Rfl:    fenofibrate  (TRICOR ) 48 MG tablet, Take 1 tablet (48 mg total) by mouth daily., Disp: 90 tablet, Rfl: 3   hydrochlorothiazide  (MICROZIDE ) 12.5 MG capsule, Take 12.5 mg by mouth in the morning., Disp: , Rfl:    icosapent  Ethyl (VASCEPA ) 1 g capsule, Take 2 capsules (2 g total) by mouth 2 (two) times daily., Disp: 120 capsule, Rfl: 3   irbesartan  (AVAPRO ) 300 MG tablet, TAKE 1 TABLET BY MOUTH DAILY, Disp: 90 tablet, Rfl: 3   metFORMIN  (GLUCOPHAGE ) 1000 MG tablet, Take 1,000 mg by mouth 2 (two) times daily with a meal., Disp: , Rfl:    metoprolol  tartrate (LOPRESSOR ) 25 MG tablet, Take 75 mg by mouth 2 (two) times daily., Disp: , Rfl:    nystatin  cream (MYCOSTATIN ), Apply 1 application topically daily  as needed for rash., Disp: , Rfl:    pantoprazole  (PROTONIX ) 40 MG tablet, Take 40 mg by mouth daily., Disp: , Rfl:    Pentosan Polysulfate Sodium  200 MG CPDR, Take 200 mg by mouth 2 (two) times daily., Disp: , Rfl:    polyvinyl alcohol  (LIQUIFILM TEARS) 1.4 % ophthalmic solution, Place 1 drop into both eyes as needed for dry eyes., Disp: , Rfl:    rosuvastatin  (CRESTOR ) 10 MG tablet, Take 1 tablet (10 mg total) by mouth daily., Disp: 90 tablet, Rfl: 3   sucralfate (CARAFATE) 1 g tablet, Take 1 g by mouth 4 (four) times daily -  with meals and at bedtime., Disp: , Rfl:    triamcinolone  cream (KENALOG ) 0.1 %, Apply 1 Application topically as needed., Disp: , Rfl:    trimethoprim  (TRIMPEX ) 100 MG tablet, Take 100 mg by mouth every evening., Disp: , Rfl:    valACYclovir (VALTREX) 500 MG tablet, Take 500 mg by mouth 2 (two) times daily as needed (outbreaks (blisters))., Disp: , Rfl:    colchicine  0.6 MG tablet, TAKE 1 TABLET BY MOUTH TWICE A DAY (Patient not taking: Reported on 07/16/2024), Disp: 30 tablet, Rfl: 0   iron polysaccharides (FERREX 150) 150 MG capsule, Take 150 mg by mouth 2 (two) times daily. (Patient not taking: Reported on 07/16/2024), Disp: , Rfl:    Physical Exam:   BP 131/75 (BP Location: Right Arm, Patient Position: Sitting, Cuff Size: Normal)   Pulse 73   Ht 5' (1.524 m)   Wt 208 lb (94.3 kg)   SpO2 96%   BMI 40.62 kg/m   Salient findings:  CN II-XII intact Bilateral EAC clear and TM intact with well pneumatized middle ear spaces Anterior rhinoscopy: Septum intact; bilateral inferior turbinates without significant hypertrophy No lesions of oral cavity/oropharynx; no palpable tongue base lesions No obviously palpable neck masses/lymphadenopathy/thyromegaly - except for relatively soft level 2 mass (right), does not appear fixed; no worrisome skin lesions No respiratory distress or stridor; TFL was indicated to better evaluate the proximal airway, given the patient's history  and exam findings, and is detailed below.  Seprately Identifiable Procedures:  Prior to initiating any procedures, risks/benefits/alternatives were explained to the patient and verbal consent obtained. Procedure Note Pre-procedure diagnosis:  right neck mass, rule out airway lesion Post-procedure diagnosis: Same Procedure: Transnasal Fiberoptic Laryngoscopy, CPT 31575 - Mod 25 Indication: see above Complications: None apparent EBL: 0 mL  The procedure was undertaken to further evaluate the patient's complaint above, with mirror exam inadequate for appropriate examination due to gag reflex and poor patient tolerance  Procedure:  Patient was identified as correct patient.  Verbal consent was obtained. The nose was sprayed with oxymetazoline and 4% lidocaine . The The flexible laryngoscope was passed through the nose to view the nasal cavity, pharynx (oropharynx, hypopharynx) and larynx.  The larynx was examined at rest and during multiple phonatory tasks. Documentation was obtained and reviewed with patient. The scope was removed. The patient tolerated the procedure well.  Findings: The nasal cavity and nasopharynx did not reveal any masses or lesions, mucosa appeared to be without obvious lesions. The tongue base, pharyngeal walls, piriform sinuses, vallecula, epiglottis and postcricoid region are normal in appearance - small nodule/heaped up mucosa right AE fold area but otherwise no lesions appreciated. The visualized portion of the subglottis and proximal trachea is widely patent. The vocal folds are mobile bilaterally. There are no lesions on the free edge of the vocal folds nor elsewhere in the larynx worrisome for malignancy.    Electronically signed by: Eldora KATHEE Blanch, MD 07/16/2024 8:19 AM   Impression & Plans:  Diane Mayo is a 73 y.o. female with:  1. Mass of right side of neck    Discussed benign and malignant etiologies for this. She needs a biopsy first to delineate. Did request  not to aspirate entire mass in case biopsy not conclusive and need removal F/u 4 weeks with bx  See below regarding exact medications prescribed this encounter including dosages and route: No orders of the defined types were placed in this encounter.     Thank you for allowing me the opportunity to care for your patient. Please do not hesitate to contact me should you have any other questions.  Sincerely, Eldora Blanch, MD Otolaryngologist (ENT), [facility] Phone: [phone] Fax: [phone]  07/16/2024, 8:19 AM   MDM:  Level 4 - [phone] Complexity/Problems addressed: mod - new problem, unknown diagnosis and prognosis needs further testing Data complexity: mod - independent interpretation of imaging - Morbidity: low currently  - Prescription Drug prescribed or managed: no

## 2024-07-16 NOTE — Patient Instructions (Signed)
 I have ordered an imaging study for you to complete prior to your next visit. Please call [facility] Scheduling at [phone] to schedule your imaging if you have not received a call within 24 hours. If you are unable to complete your imaging study prior to your next scheduled visit please call our office to let us know.

## 2024-07-16 NOTE — Progress Notes (Signed)
 Diane Sieving, MD  Diane Mayo PROCEDURE / BIOPSY REVIEW Date: 07/16/24  Requested Biopsy site: R neck complex cystic lesion Reason for request: ca v other Imaging review: Best seen on US  and CT  Decision: Approved Imaging modality to perform: Ultrasound Schedule with: No sedation / Local anesthetic Schedule for: Any VIR  Additional comments: @VIR : do not remove all the fluid per requesting doc  Please contact me with questions, concerns, or if issue pertaining to this request arise.  Dayne Mayo Johann, MD [facility]       Previous Messages    ----- Message ----- From: Ayoub Arey Sent: 07/16/2024   8:23 AM EDT To: Zennie Ayars; Ir Procedure Requests Subject: US  FNA SOFT TISSUE                            Procedure :US  FNA SOFT TISSUE  Reason: Right neck cystic mass -  PLEASE do not aspirate a large percent/decompress the entire mass as if it were to be operated on, will be unable to find it. Concern for carcinoma - PLEASE do not aspirate a large percent/decompress the entire mass as if it were to be operated on, will be unable to find it. Concern for carcinoma Dx: Mass of right side of neck [R22.1 (ICD-10-CM)]  Ordering Comments  right neck cystic mass - PLEASE do not aspirate a large percent/decompress the entire mass as if it were to be operated on, will be unable to find it. Concern for carcinoma    History : Ct soft tissue neck w/ , US  soft tissue head and neck, Ct abd pelv w/  Provider : Tobie Eldora NOVAK, MD  Contact :  [phone]

## 2024-07-31 ENCOUNTER — Other Ambulatory Visit: Payer: Self-pay | Admitting: Gastroenterology

## 2024-07-31 DIAGNOSIS — K7460 Unspecified cirrhosis of liver: Secondary | ICD-10-CM

## 2024-08-04 ENCOUNTER — Ambulatory Visit (HOSPITAL_COMMUNITY)
Admission: RE | Admit: 2024-08-04 | Discharge: 2024-08-04 | Disposition: A | Discharge status: Home / Self Care | Source: Ambulatory Visit | Attending: Otolaryngology | Admitting: Otolaryngology

## 2024-08-04 DIAGNOSIS — R221 Localized swelling, mass and lump, neck: Secondary | ICD-10-CM | POA: Diagnosis present

## 2024-08-04 DIAGNOSIS — C07 Malignant neoplasm of parotid gland: Secondary | ICD-10-CM | POA: Diagnosis not present

## 2024-08-04 DIAGNOSIS — K116 Mucocele of salivary gland: Secondary | ICD-10-CM | POA: Diagnosis not present

## 2024-08-04 DIAGNOSIS — K118 Other diseases of salivary glands: Secondary | ICD-10-CM | POA: Diagnosis not present

## 2024-08-04 MED ORDER — LIDOCAINE-EPINEPHRINE (PF) 2 %-1:200000 IJ SOLN
INTRAMUSCULAR | Status: AC
  Filled 2024-08-04: qty 20

## 2024-08-04 NOTE — Procedures (Signed)
 Vascular and Interventional Radiology Procedure Note  Patient: Diane Mayo DOB: November 14, 1951 Medical Record Number: [id number] Note Date/Time: 08/04/24 1:06 PM   Performing Physician: Thom Hall, MD Assistant(s): None  Diagnosis: R parotid cystic mass   Procedure: RIGHT PAROTID CYSTIC MASS ASPIRATION   Anesthesia: Local Anesthetic Complications: None Estimated Blood Loss: Minimal Specimens: Sent for Cytology  Findings:  Successful Ultrasound-guided ASPIRATION of a R parotid cystic mass. A 10 mL sample was obtained. Hemostasis of the tract was achieved using Manual Pressure.  Plan: Bed rest for 0 hours.  See detailed procedure note with images in PACS. The patient tolerated the procedure well without incident or complication and was returned to Recovery in stable condition.    Thom Hall, MD [facility]   Pager. [phone] Clinic. [phone]

## 2024-08-06 ENCOUNTER — Ambulatory Visit
Admission: RE | Admit: 2024-08-06 | Discharge: 2024-08-06 | Disposition: A | Discharge status: Home / Self Care | Source: Ambulatory Visit | Attending: Gastroenterology | Admitting: Gastroenterology

## 2024-08-06 DIAGNOSIS — K7460 Unspecified cirrhosis of liver: Secondary | ICD-10-CM | POA: Diagnosis not present

## 2024-08-14 ENCOUNTER — Encounter (INDEPENDENT_AMBULATORY_CARE_PROVIDER_SITE_OTHER): Payer: Self-pay | Admitting: Otolaryngology

## 2024-08-14 ENCOUNTER — Ambulatory Visit (INDEPENDENT_AMBULATORY_CARE_PROVIDER_SITE_OTHER): Admitting: Otolaryngology

## 2024-08-14 VITALS — BP 150/64 | HR 66

## 2024-08-14 DIAGNOSIS — C4492 Squamous cell carcinoma of skin, unspecified: Secondary | ICD-10-CM | POA: Diagnosis not present

## 2024-08-14 DIAGNOSIS — C7989 Secondary malignant neoplasm of other specified sites: Secondary | ICD-10-CM

## 2024-08-14 NOTE — Progress Notes (Signed)
 Dear Dr. Austin, Here is my assessment for our mutual patient, Diane Mayo. Thank you for allowing me the opportunity to care for your patient. Please do not hesitate to contact me should you have any other questions. Sincerely, Dr. Eldora Blanch  Otolaryngology Clinic Note Referring provider: Dr. Austin HPI:  Diane Mayo is a 73 y.o. female kindly referred by Dr. Austin for evaluation of right neck  Initial visit (06/2024):  Patient reports: noticed in January incidentally. Maybe some bigger. Not tender, some pressure sensation. Warm compress does help. No antecedent event including URI. No cat scratches, animal exposures, fevers, B symptoms. Patient otherwise denies: - dysphagia, odynophagia, unintentional weight loss - changes in voice, shortness of breath, hemoptysis - ear pain, other neck masses - No skin cancers or face or neck  No biopsies of the area  --------------------------------------------------------- 08/14/2024 Seen in follow up. We discussed her biopsy results. She continues to deny any head and neck symptoms except for some pressure sensation over the neck.   ENT Surgery: no Personal or FHx of bleeding dz or anesthesia difficulty: no  GLP-1: no AP/AC: no  Tobacco: former, 5 year. Alcohol : no significant use.  PMHx: HTN, HLD, Gout, DM, HF  Independent Review of Additional Tests or Records:  Labs CMP 05/14/2024: BUN/Cr 19/1  CT Neck 04/09/2024 and US  03/18/2024 independently interpreted: Noted right level 2 cystic lesion - does not appear related to parotid, no parotid nodes; query small right submandibular gland nodule; no other significant LAD identified; prominent lingual tonsils; query small hypopharynx/AE fold asymmetry (just effacement?)  Dr. Austin referral notes (05/05/2024): right neck mass, possible branchial cleft v/s cystic LN; ref to ENT  FNA 08/04/2024: Squamous cell carcinoma - patch p16, reports does not support HPV related SCCa  PMH/Meds/All/SocHx/FamHx/ROS:    Past Medical History:  Diagnosis Date   Anemia    Diabetes mellitus without complication (HCC)    Type 2   Genital herpes    GERD (gastroesophageal reflux disease)    Hypertension    Interstitial cystitis    Left bundle branch block (LBBB)    chronic   Leukocytosis    Osteoarthritis    Plantar fasciitis      Past Surgical History:  Procedure Laterality Date   BACK SURGERY     CARPAL TUNNEL RELEASE Bilateral    CESAREAN SECTION     twice 1978 and 1987   DILATION AND CURETTAGE OF UTERUS  1975   EYE SURGERY Bilateral 2021   cataracts removed   SHOULDER SURGERY Right 2012   rotator cuff repair   TUBAL LIGATION  1987    Family History  Problem Relation Age of Onset   Hypertension Mother    Diabetes Mother    Other Mother        hay fever   Pneumonia Mother        aspiration   Gout Father    Skin cancer Father    Heart attack Father    Kidney failure Father    Hypertension Father    CVA Father    CAD Father    Asthma Brother    Other Brother        hay fever   Diabetes type II Maternal Grandfather    Alcoholism Brother      Social Connections: Not on file      Current Outpatient Medications:    acetaminophen  (TYLENOL ) 500 MG tablet, Take 500-1,000 mg by mouth every 6 (six) hours as needed (pain.)., Disp: , Rfl:  allopurinol  (ZYLOPRIM ) 300 MG tablet, Take 300 mg by mouth in the morning., Disp: , Rfl:    amLODipine  (NORVASC ) 10 MG tablet, Take 10 mg by mouth in the morning., Disp: , Rfl:    cetirizine (ZYRTEC) 10 MG tablet, Take 10 mg by mouth in the morning., Disp: , Rfl:    fenofibrate  (TRICOR ) 48 MG tablet, Take 1 tablet (48 mg total) by mouth daily., Disp: 90 tablet, Rfl: 3   hydrochlorothiazide  (MICROZIDE ) 12.5 MG capsule, Take 12.5 mg by mouth in the morning., Disp: , Rfl:    icosapent  Ethyl (VASCEPA ) 1 g capsule, Take 2 capsules (2 g total) by mouth 2 (two) times daily., Disp: 120 capsule, Rfl: 3   irbesartan  (AVAPRO ) 300 MG tablet, TAKE 1 TABLET  BY MOUTH DAILY, Disp: 90 tablet, Rfl: 3   iron polysaccharides (FERREX 150) 150 MG capsule, Take 150 mg by mouth 2 (two) times daily., Disp: , Rfl:    metFORMIN  (GLUCOPHAGE ) 1000 MG tablet, Take 1,000 mg by mouth 2 (two) times daily with a meal., Disp: , Rfl:    metoprolol  tartrate (LOPRESSOR ) 25 MG tablet, Take 75 mg by mouth 2 (two) times daily., Disp: , Rfl:    nystatin  cream (MYCOSTATIN ), Apply 1 application topically daily as needed for rash., Disp: , Rfl:    pantoprazole  (PROTONIX ) 40 MG tablet, Take 40 mg by mouth daily., Disp: , Rfl:    Pentosan Polysulfate Sodium  200 MG CPDR, Take 200 mg by mouth 2 (two) times daily., Disp: , Rfl:    polyvinyl alcohol  (LIQUIFILM TEARS) 1.4 % ophthalmic solution, Place 1 drop into both eyes as needed for dry eyes., Disp: , Rfl:    rosuvastatin  (CRESTOR ) 10 MG tablet, Take 1 tablet (10 mg total) by mouth daily., Disp: 90 tablet, Rfl: 3   sucralfate (CARAFATE) 1 g tablet, Take 1 g by mouth 4 (four) times daily -  with meals and at bedtime., Disp: , Rfl:    triamcinolone  cream (KENALOG ) 0.1 %, Apply 1 Application topically as needed., Disp: , Rfl:    trimethoprim  (TRIMPEX ) 100 MG tablet, Take 100 mg by mouth every evening., Disp: , Rfl:    valACYclovir (VALTREX) 500 MG tablet, Take 500 mg by mouth 2 (two) times daily as needed (outbreaks (blisters))., Disp: , Rfl:    colchicine  0.6 MG tablet, TAKE 1 TABLET BY MOUTH TWICE A DAY (Patient not taking: Reported on 08/14/2024), Disp: 30 tablet, Rfl: 0   Physical Exam:   BP (!) 150/64 (Cuff Size: Normal)   Pulse 66   SpO2 97%   Salient findings:  CN II-XII intact Bilateral EAC clear and TM intact with well pneumatized middle ear spaces Anterior rhinoscopy: Septum intact; bilateral inferior turbinates without significant hypertrophy No lesions of oral cavity/oropharynx; no palpable tongue base lesions No obviously palpable neck masses/lymphadenopathy/thyromegaly - except for relatively soft level 2 mass (right),  does not appear fixed No respiratory distress or stridor No noted worrisome skin lesions on face or neck  Seprately Identifiable Procedures:  Prior to initiating any procedures, risks/benefits/alternatives were explained to the patient and verbal consent obtained. Procedure Note (prior, not today) Pre-procedure diagnosis:  right neck mass, rule out airway lesion Post-procedure diagnosis: Same Procedure: Transnasal Fiberoptic Laryngoscopy, CPT 31575 - Mod 25 Indication: see above Complications: None apparent EBL: 0 mL  The procedure was undertaken to further evaluate the patient's complaint above, with mirror exam inadequate for appropriate examination due to gag reflex and poor patient tolerance  Procedure:  Patient was identified  as correct patient. Verbal consent was obtained. The nose was sprayed with oxymetazoline and 4% lidocaine . The The flexible laryngoscope was passed through the nose to view the nasal cavity, pharynx (oropharynx, hypopharynx) and larynx.  The larynx was examined at rest and during multiple phonatory tasks. Documentation was obtained and reviewed with patient. The scope was removed. The patient tolerated the procedure well.  Findings: The nasal cavity and nasopharynx did not reveal any masses or lesions, mucosa appeared to be without obvious lesions. The tongue base, pharyngeal walls, piriform sinuses, vallecula, epiglottis and postcricoid region are normal in appearance - small nodule/heaped up mucosa right AE fold area but otherwise no lesions appreciated. The visualized portion of the subglottis and proximal trachea is widely patent. The vocal folds are mobile bilaterally. There are no lesions on the free edge of the vocal folds nor elsewhere in the larynx worrisome for malignancy.    Electronically signed by: Eldora KATHEE Blanch, MD 08/16/2024 10:12 AM   Impression & Plans:  Brileigh Sevcik is a 73 y.o. female with:  1. 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 Currently SCCa H&N p16-; though p16-, given how this has behaved, I do suspect HPV may be positive but p16-. Query if from tonsil v/s lingual tonsil? Regardless she needs further workup with DL/Bx - will start with DL/Bx, right tonsillectomy and directed biopsies as indicated PET first Refer to Rad/Onc as well to discuss possible radiation as a management option She is ready to proceed  See below regarding exact medications prescribed this encounter including dosages and route: No orders of the defined types were placed in this encounter.     Thank you for allowing me the opportunity to care for your patient. Please do not hesitate to contact me should you have any other questions.  Sincerely, Eldora Blanch, MD Otolaryngologist (ENT), [facility] Phone: [phone] Fax: [phone]  08/16/2024, 10:12 AM   I have personally spent 41 minutes involved in face-to-face and non-face-to-face activities for this patient on the day of the visit.  Professional time spent excludes any procedures performed but includes the following activities, in addition to those noted in the documentation: preparing to see the patient (review of outside documentation and results), performing a medically appropriate examination, extensive counseling given new cancer diagnosis, documenting in the electronic health record, independently interpreting results

## 2024-08-15 ENCOUNTER — Encounter (HOSPITAL_COMMUNITY)
Admission: RE | Admit: 2024-08-15 | Discharge: 2024-08-15 | Disposition: A | Discharge status: Home / Self Care | Source: Ambulatory Visit | Attending: Otolaryngology | Admitting: Otolaryngology

## 2024-08-15 DIAGNOSIS — R221 Localized swelling, mass and lump, neck: Secondary | ICD-10-CM | POA: Diagnosis not present

## 2024-08-15 DIAGNOSIS — C7989 Secondary malignant neoplasm of other specified sites: Secondary | ICD-10-CM | POA: Insufficient documentation

## 2024-08-15 DIAGNOSIS — C4492 Squamous cell carcinoma of skin, unspecified: Secondary | ICD-10-CM | POA: Insufficient documentation

## 2024-08-15 LAB — GLUCOSE, CAPILLARY: Glucose-Capillary: 159 mg/dL — ABNORMAL HIGH (ref 70–99)

## 2024-08-15 MED ORDER — FLUDEOXYGLUCOSE F - 18 (FDG) INJECTION
10.2500 | Freq: Once | INTRAVENOUS | Status: AC
  Administered 2024-08-15: 10.25 via INTRAVENOUS

## 2024-08-22 NOTE — Progress Notes (Signed)
 Head and Neck Cancer Location of Tumor / Histology:  Squamous Cell Carcinoma Metastatic to Head and Neck with Unknown Primary Site.  Patient presented 7 months ago with symptoms of:  Patient noticed a mass on right side of neck with some pressure sensation. Uses a warm compress and it helps with her symptoms.Mass on right side of neck is not painful. Denies any dysphagia or pressure  Biopsies Revealed:   08/15/2024 PET Scan     Nutrition Status Yes No Comments  Weight changes? []  [x]  Weight has stayed stable  Swallowing concerns? []  [x]    PEG? []  [x]     Referrals Yes No Comments  Social Work? [x]  []    Dentistry? [x]  []    Swallowing therapy? [x]  []    Nutrition? [x]  []    Med/Onc? [x]  []     Safety Issues Yes No Comments  Prior radiation? []  [x]    Pacemaker/ICD? []  [x]    Possible current pregnancy? []  [x]    Is the patient on methotrexate? [x]  []     Tobacco/Marijuana/Snuff/ETOH use:  Former tobacco user. No significant use of alcohol .  Past/Anticipated interventions by otolaryngology, if any:  07/16/2024 Tobie, MD Transnasal Fiberoptic Laryngoscopy  Findings   Past/Anticipated interventions by medical oncology, if any: Radiation Therapy     Current Complaints / other details:  None

## 2024-08-25 NOTE — Progress Notes (Signed)
 Radiation Oncology         [phone] ________________________________  Initial outpatient Consultation  Name: Diane Mayo MRN: [id number]  Date: 08/27/2024  DOB: [birth year]  CC:Sun, Vyvyan, MD  Tobie Eldora NOVAK, MD   REFERRING PHYSICIAN: Tobie Eldora NOVAK, MD  DIAGNOSIS: No diagnosis found.  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   CHIEF COMPLAINT: Here to discuss management of neck cancer  HISTORY OF PRESENT ILLNESS::Diane Mayo is a 73 y.o. female who presented to her PCP in late January of 2025 with concerns over a mass on her right neck. She underwent a head and neck US  on 03/18/24 showing a 4.5 cm complex cystic mass in or adjacent to the right parotid gland. To further investigate her symptoms she then underwent a neck CT on 04/09/24 showing a  right level 2A there is a predominantly cystic lesion measuring up to 3.2 cm in greatest axial dimension.     She was then referred to Dr. Tobie on 07/16/24 for a consult regarding her mass. She then underwent a fine needle aspiration biopsy of right parotid with pathology indicating squamous cell carcinoma. Immunohistochemical stain for p16 was performed. The stain show increased staining in the basal layer of the squamous epithelium but  overall it is patchy. There is no definitive evidence of block like  pattern of staining. The finding does not support a HPV related squamous  cell carcinoma.    Pertinent imaging thus far includes: --US  abdomen 08/06/24: Cirrhotic liver morphology without focal hepatic lesion.   --PET scan 08/15/24: showing right-sided neck mass demonstrates low level heterogeneous peripheral FDG accumulation with SUV of 4.3 with no other hypermetabolic lymphadenopathy in the neck. No evidence for hypermetabolic metastatic disease in the chest, abdomen, or pelvis.    During her most recent follow  up on 08/14/24, patient opted to proceed with bilateral tonsillectomy and directed laryngoscopy with biopsies for which she is scheduled to undergo on 09/26/24.    Swallowing issues, if any: ***  Weight Changes: ***  Pain status: ***  Other symptoms: ***  Tobacco history, if any: ***  ETOH abuse, if any: ***  Prior cancers, if any: ***  PREVIOUS RADIATION THERAPY: {EXAM; YES/NO:19492::No}  PAST MEDICAL HISTORY:  has a past medical history of Anemia, Diabetes mellitus without complication (HCC), Genital herpes, GERD (gastroesophageal reflux disease), Hypertension, Interstitial cystitis, Left bundle branch block (LBBB), Leukocytosis, Osteoarthritis, and Plantar fasciitis.    PAST SURGICAL HISTORY: Past Surgical History:  Procedure Laterality Date   BACK SURGERY     CARPAL TUNNEL RELEASE Bilateral    CESAREAN SECTION     twice 1978 and 1987   DILATION AND CURETTAGE OF UTERUS  1975   EYE SURGERY Bilateral 2021   cataracts removed   SHOULDER SURGERY Right 2012   rotator cuff repair   TUBAL LIGATION  1987    FAMILY HISTORY: family history includes Alcoholism in her brother; Asthma in her brother; CAD in her father; CVA in her father; Diabetes in her mother; Diabetes type II in her maternal grandfather; Gout in her father; Heart attack in her father; Hypertension in her father and mother; Kidney failure in her father; Other in her brother and mother; Pneumonia in her mother; Skin cancer in her father.  SOCIAL HISTORY:  reports that she quit smoking about 38 years ago. Her smoking use included cigarettes. She  started smoking about 39 years ago. She has a 0.5 pack-year smoking history. She has never used smokeless tobacco. She reports current alcohol  use. She reports that she does not use drugs.  ALLERGIES: Penicillins, Accupril [quinapril hcl], Januvia [sitagliptin], Monistat [miconazole], Tioconazole, and Zestril [lisinopril]  MEDICATIONS:  Current Outpatient Medications   Medication Sig Dispense Refill   acetaminophen  (TYLENOL ) 500 MG tablet Take 500-1,000 mg by mouth every 6 (six) hours as needed (pain.).     allopurinol  (ZYLOPRIM ) 300 MG tablet Take 300 mg by mouth in the morning.     amLODipine  (NORVASC ) 10 MG tablet Take 10 mg by mouth in the morning.     cetirizine (ZYRTEC) 10 MG tablet Take 10 mg by mouth in the morning.     colchicine  0.6 MG tablet TAKE 1 TABLET BY MOUTH TWICE A DAY (Patient not taking: Reported on 08/14/2024) 30 tablet 0   fenofibrate  (TRICOR ) 48 MG tablet Take 1 tablet (48 mg total) by mouth daily. 90 tablet 3   hydrochlorothiazide  (MICROZIDE ) 12.5 MG capsule Take 12.5 mg by mouth in the morning.     icosapent  Ethyl (VASCEPA ) 1 g capsule Take 2 capsules (2 g total) by mouth 2 (two) times daily. 120 capsule 3   irbesartan  (AVAPRO ) 300 MG tablet TAKE 1 TABLET BY MOUTH DAILY 90 tablet 3   iron polysaccharides (FERREX 150) 150 MG capsule Take 150 mg by mouth 2 (two) times daily.     metFORMIN  (GLUCOPHAGE ) 1000 MG tablet Take 1,000 mg by mouth 2 (two) times daily with a meal.     metoprolol  tartrate (LOPRESSOR ) 25 MG tablet Take 75 mg by mouth 2 (two) times daily.     nystatin  cream (MYCOSTATIN ) Apply 1 application topically daily as needed for rash.     pantoprazole  (PROTONIX ) 40 MG tablet Take 40 mg by mouth daily.     Pentosan Polysulfate Sodium  200 MG CPDR Take 200 mg by mouth 2 (two) times daily.     polyvinyl alcohol  (LIQUIFILM TEARS) 1.4 % ophthalmic solution Place 1 drop into both eyes as needed for dry eyes.     rosuvastatin  (CRESTOR ) 10 MG tablet Take 1 tablet (10 mg total) by mouth daily. 90 tablet 3   sucralfate (CARAFATE) 1 g tablet Take 1 g by mouth 4 (four) times daily -  with meals and at bedtime.     triamcinolone  cream (KENALOG ) 0.1 % Apply 1 Application topically as needed.     trimethoprim  (TRIMPEX ) 100 MG tablet Take 100 mg by mouth every evening.     valACYclovir (VALTREX) 500 MG tablet Take 500 mg by mouth 2 (two)  times daily as needed (outbreaks (blisters)).     No current facility-administered medications for this encounter.    REVIEW OF SYSTEMS:  Notable for that above.   PHYSICAL EXAM:  vitals were not taken for this visit.   General: Alert and oriented, in no acute distress HEENT: Head is normocephalic. Extraocular movements are intact. Oropharynx is notable for ***. Neck: Neck is notable for *** Heart: Regular in rate and rhythm with no murmurs, rubs, or gallops. Chest: Clear to auscultation bilaterally, with no rhonchi, wheezes, or rales. Abdomen: Soft, nontender, nondistended, with no rigidity or guarding. Extremities: No cyanosis or edema. Lymphatics: see Neck Exam Skin: No concerning lesions. Musculoskeletal: symmetric strength and muscle tone throughout. Neurologic: Cranial nerves II through XII are grossly intact. No obvious focalities. Speech is fluent. Coordination is intact. Psychiatric: Judgment and insight are intact. Affect is appropriate.  ECOG = ***  0 - Asymptomatic (Fully active, able to carry on all predisease activities without restriction)  1 - Symptomatic but completely ambulatory (Restricted in physically strenuous activity but ambulatory and able to carry out work of a light or sedentary nature. For example, light housework, office work)  2 - Symptomatic, <50% in bed during the day (Ambulatory and capable of all self care but unable to carry out any work activities. Up and about more than 50% of waking hours)  3 - Symptomatic, >50% in bed, but not bedbound (Capable of only limited self-care, confined to bed or chair 50% or more of waking hours)  4 - Bedbound (Completely disabled. Cannot carry on any self-care. Totally confined to bed or chair)  5 - Death   Raylene MM, Creech RH, Tormey DC, et al. [phone]). Toxicity and response criteria of the [facility]. Am. DOROTHA Bridges. Oncol. 5 (6): 649-55   LABORATORY DATA:  Lab Results  Component Value  Date   WBC 11.8 (H) 10/11/2022   HGB 11.0 (L) 10/11/2022   HCT 35.8 (L) 10/11/2022   MCV 83.1 10/11/2022   PLT 313 10/11/2022   CMP     Component Value Date/Time   NA 139 05/14/2024 0813   K 5.0 05/14/2024 0813   CL 104 05/14/2024 0813   CO2 16 (L) 05/14/2024 0813   GLUCOSE 148 (H) 05/14/2024 0813   GLUCOSE 154 (H) 10/11/2022 1153   BUN 19 05/14/2024 0813   CREATININE 1.00 05/14/2024 0813   CALCIUM  9.8 05/14/2024 0813   PROT 6.5 05/14/2024 0813   ALBUMIN 4.5 05/14/2024 0813   AST 55 (H) 05/14/2024 0813   ALT 39 (H) 05/14/2024 0813   ALKPHOS 86 05/14/2024 0813   BILITOT 0.2 05/14/2024 0813   GFRNONAA 58 (L) 10/11/2022 1153   GFRAA 108 02/03/2020 1252      No results found for: TSH   RADIOGRAPHY: NM PET Image Initial (PI) Skull Base To Thigh (F-18 FDG) Result Date: 08/15/2024 CLINICAL DATA:  Initial treatment strategy for squamous cell carcinoma of unknown primary. EXAM: NUCLEAR MEDICINE PET SKULL BASE TO THIGH TECHNIQUE: 10.3 mCi F-18 FDG was injected intravenously. Full-ring PET imaging was performed from the skull base to thigh after the radiotracer. CT data was obtained and used for attenuation correction and anatomic localization. Fasting blood glucose: 159 mg/dl COMPARISON:  Neck CT 95/83/7974.  Abdomen pelvis CT 01/29/2024 FINDINGS: Mediastinal blood pool activity: SUV max 3.3 Liver activity: SUV max NA NECK: The patient's known right-sided neck mass demonstrates low level heterogeneous peripheral FDG accumulation with SUV max = 4.3. No other hypermetabolic lymphadenopathy in the neck. Incidental CT findings: None. CHEST: No hypermetabolic mediastinal or hilar nodes. No suspicious pulmonary nodules on the CT scan. Incidental CT findings: Coronary artery calcification is evident. Mild atherosclerotic calcification is noted in the wall of the thoracic aorta. ABDOMEN/PELVIS: No abnormal hypermetabolic activity within the liver, pancreas, adrenal glands, or spleen. No  hypermetabolic lymph nodes in the abdomen or pelvis. Incidental CT findings: Enlargement of the lateral segment left liver with nodular hepatic contour suggests cirrhosis. There is moderate atherosclerotic calcification of the abdominal aorta without aneurysm. Circumferential wall thickening in the stomach without hypermetabolism likely related to underdistention. SKELETON: No focal hypermetabolic activity to suggest skeletal metastasis. Incidental CT findings: Lumbar fusion hardware evident. IMPRESSION: 1. The patient's known right-sided neck mass demonstrates low level hypermetabolic heterogeneous peripheral FDG accumulation. No other hypermetabolic lymphadenopathy in the neck. 2. No evidence for hypermetabolic metastatic disease  in the chest, abdomen, or pelvis. 3. Enlargement of the lateral segment left liver with nodular hepatic contour suggests cirrhosis. 4.  Aortic Atherosclerosis (ICD10-I70.0). Electronically Signed   By: Camellia Candle M.D.   On: 08/15/2024 08:49   US  Abdomen Limited RUQ (LIVER/GB) Result Date: 08/07/2024 CLINICAL DATA:  Cirrhosis EXAM: ULTRASOUND ABDOMEN LIMITED RIGHT UPPER QUADRANT COMPARISON:  CT abdomen pelvis 01/29/2024 FINDINGS: Gallbladder: Status post cholecystectomy Common bile duct: Diameter: 2 mm Liver: Parenchymal echogenicity: Diffusely increased Contours: Nodular Lesions: None Portal vein: Patent.  Hepatopetal flow Other: None. IMPRESSION: Cirrhotic liver morphology without focal hepatic lesion. Electronically Signed   By: Aliene Lloyd M.D.   On: 08/07/2024 16:32   US  FNA SOFT TISSUE Result Date: 08/04/2024 INDICATION: Right neck cystic mass - PLEASE do not aspirate a large percent/decompress the entire mass as if it were to be operated on, will be unable to find it. Concern for carcinoma EXAM: US -GUIDED RIGHT PAROTID CYSTIC MASS ASPIRATION COMPARISON:  CT NECK, 04/09/2024.  ULTRASOUND NECK, 03/18/2024 MEDICATIONS: None ANESTHESIA/SEDATION: Local anesthetic was  administered. CONTRAST:  None COMPLICATIONS: None immediate. PROCEDURE: Informed written consent was obtained from the patient after a discussion of the risks, benefits and alternatives to treatment. Preprocedural ultrasound scanning demonstrated a 4 cm RIGHT parotid mass. A timeout was performed prior to the initiation of the procedure. The RIGHT neck was prepped and draped in the usual sterile fashion. The overlying soft tissues were anesthetized with 1% lidocaine  with epinephrine . Under direct ultrasound guidance, a 18 gauge trocar needle was advanced into the cystic mass. Multiple ultrasound images were saved for procedural documentation purposes. Next, approximately 10 mL serosanguineous fluid was aspirated from the collection. A representative sample of aspirated fluid was capped and sent to the laboratory for analysis. The needle was removed and superficial hemostasis was achieved with manual compression. A dressing was placed. The patient tolerated the procedure well without immediate postprocedural complication. IMPRESSION: Successful US  guided diagnostic aspiration of a RIGHT parotid cystic mass, yielding 10 mL of serosanguineous fluid. Thom Hall, MD [facility]iology Electronically Signed   By: Thom Hall M.D.   On: 08/04/2024 14:17      IMPRESSION/PLAN:  This is a delightful patient with head and neck cancer. I *** recommend radiotherapy for this patient.  We discussed the potential risks, benefits, and side effects of radiotherapy. We talked in detail about acute and late effects. We discussed that some of the most bothersome acute effects may be mucositis, dysgeusia, salivary changes, skin irritation, hair loss, dehydration, weight loss and fatigue. We talked about late effects which include but are not necessarily limited to dysphagia, hypothyroidism, nerve injury, vascular injury, spinal cord injury, xerostomia, trismus, neck edema,  dental issues, non-healing wound, and potentially fatal injury to any of the tissues in the head and neck region. No guarantees of treatment were given. A consent form was signed and placed in the patient's medical record. The patient is enthusiastic about proceeding with treatment. I look forward to participating in the patient's care.    Simulation (treatment planning) will take place ***  We also discussed that the treatment of head and neck cancer is a multidisciplinary process to maximize treatment outcomes and quality of life. For this reason the following referrals have been or will be made:  *** Medical oncology to discuss chemotherapy   *** Dentistry for dental evaluation, possible extractions in the radiation fields, and /or advice on reducing risk of cavities, osteoradionecrosis, or other oral issues.  ***  Nutritionist for nutrition support during and after treatment.  *** Speech language pathology for swallowing and/or speech therapy.  *** Social work for social support.   *** Physical therapy due to risk of lymphedema in neck and deconditioning.  *** Baseline labs including TSH.  On date of service, in total, I spent *** minutes on this encounter. Patient was seen in person.  __________________________________________   Lauraine Golden, MD  This document serves as a record of services personally performed by Lauraine Golden, MD. It was created on her behalf by Reymundo Cartwright, a trained medical scribe. The creation of this record is based on the scribe's personal observations and the provider's statements to them. This document has been checked and approved by the attending provider.

## 2024-08-27 ENCOUNTER — Encounter: Payer: Self-pay | Admitting: Radiation Oncology

## 2024-08-27 ENCOUNTER — Ambulatory Visit
Admission: RE | Admit: 2024-08-27 | Discharge: 2024-08-27 | Disposition: A | Discharge status: Home / Self Care | Source: Ambulatory Visit | Attending: Radiation Oncology | Admitting: Radiation Oncology

## 2024-08-27 ENCOUNTER — Other Ambulatory Visit: Payer: Self-pay

## 2024-08-27 VITALS — BP 172/66 | HR 71 | Temp 97.8°F | Resp 20 | Ht 60.0 in | Wt 211.4 lb

## 2024-08-27 DIAGNOSIS — C801 Malignant (primary) neoplasm, unspecified: Secondary | ICD-10-CM | POA: Diagnosis not present

## 2024-08-27 DIAGNOSIS — I700 Atherosclerosis of aorta: Secondary | ICD-10-CM | POA: Diagnosis not present

## 2024-08-27 DIAGNOSIS — I447 Left bundle-branch block, unspecified: Secondary | ICD-10-CM | POA: Diagnosis not present

## 2024-08-27 DIAGNOSIS — C4442 Squamous cell carcinoma of skin of scalp and neck: Secondary | ICD-10-CM

## 2024-08-27 DIAGNOSIS — I10 Essential (primary) hypertension: Secondary | ICD-10-CM | POA: Diagnosis not present

## 2024-08-27 DIAGNOSIS — C4492 Squamous cell carcinoma of skin, unspecified: Secondary | ICD-10-CM

## 2024-08-27 DIAGNOSIS — E119 Type 2 diabetes mellitus without complications: Secondary | ICD-10-CM | POA: Diagnosis not present

## 2024-08-27 DIAGNOSIS — K7460 Unspecified cirrhosis of liver: Secondary | ICD-10-CM | POA: Insufficient documentation

## 2024-08-27 DIAGNOSIS — C770 Secondary and unspecified malignant neoplasm of lymph nodes of head, face and neck: Secondary | ICD-10-CM | POA: Insufficient documentation

## 2024-08-27 DIAGNOSIS — Z87891 Personal history of nicotine dependence: Secondary | ICD-10-CM | POA: Diagnosis not present

## 2024-08-27 DIAGNOSIS — Z809 Family history of malignant neoplasm, unspecified: Secondary | ICD-10-CM | POA: Diagnosis not present

## 2024-08-27 DIAGNOSIS — I2510 Atherosclerotic heart disease of native coronary artery without angina pectoris: Secondary | ICD-10-CM | POA: Insufficient documentation

## 2024-08-27 DIAGNOSIS — M1990 Unspecified osteoarthritis, unspecified site: Secondary | ICD-10-CM | POA: Diagnosis not present

## 2024-08-27 DIAGNOSIS — Z79899 Other long term (current) drug therapy: Secondary | ICD-10-CM | POA: Diagnosis not present

## 2024-08-27 DIAGNOSIS — Z7984 Long term (current) use of oral hypoglycemic drugs: Secondary | ICD-10-CM | POA: Insufficient documentation

## 2024-08-27 DIAGNOSIS — D649 Anemia, unspecified: Secondary | ICD-10-CM | POA: Diagnosis not present

## 2024-08-27 DIAGNOSIS — C7989 Secondary malignant neoplasm of other specified sites: Secondary | ICD-10-CM | POA: Insufficient documentation

## 2024-08-27 DIAGNOSIS — B977 Papillomavirus as the cause of diseases classified elsewhere: Secondary | ICD-10-CM | POA: Insufficient documentation

## 2024-08-27 DIAGNOSIS — M722 Plantar fascial fibromatosis: Secondary | ICD-10-CM | POA: Diagnosis not present

## 2024-08-27 DIAGNOSIS — K219 Gastro-esophageal reflux disease without esophagitis: Secondary | ICD-10-CM | POA: Insufficient documentation

## 2024-08-27 DIAGNOSIS — N3010 Interstitial cystitis (chronic) without hematuria: Secondary | ICD-10-CM | POA: Insufficient documentation

## 2024-08-27 NOTE — Progress Notes (Signed)
 Oncology Nurse Navigator Documentation   Met with patient during initial consult with Dr. Izell. She was accompanied by her husband, Butch.  I introduced myself as Theatre stage manager, explained my role as a member of the Care Team. Provided New Patient resource guide binder: Contact information for physicians, this navigator, other members of the Care Team Advance Directive information; provided [facility] AD booklet at their request,  Fall Prevention Patient Safety Plan Financial Assistance Information sheet Symptom Management Clinic information [facility] map with highlight of [facility] SLP Information sheet Head and Neck cancer basics Nutrition information Patient and family support information including Spiritual care/Chaplain information, Peer mentor program, health and wellness classes, and the survivorship program Community resources  Provided and discussed educational handouts for PEG and PAC. Assisted with post-consult appt scheduling. I have placed a referral to Dr. Danial for dental evaluation/SPD fabrication before starting radiation.    They verbalized understanding of information provided. I encouraged them to call with questions/concerns moving forward.  [name], RN, BSN, OCN Head & Neck Oncology Nurse Navigator [facility] [phone]

## 2024-08-28 ENCOUNTER — Telehealth (INDEPENDENT_AMBULATORY_CARE_PROVIDER_SITE_OTHER): Payer: Self-pay

## 2024-08-28 DIAGNOSIS — C4492 Squamous cell carcinoma of skin, unspecified: Secondary | ICD-10-CM | POA: Diagnosis not present

## 2024-08-28 DIAGNOSIS — C7989 Secondary malignant neoplasm of other specified sites: Secondary | ICD-10-CM | POA: Diagnosis not present

## 2024-08-28 NOTE — Telephone Encounter (Signed)
 Patient called and LVM wanting to know if Dr. Tobie wanted to get cardiac clearance for her surgery. Her cardiologist is Dr. Michele. Please call her back [phone].

## 2024-08-28 NOTE — Progress Notes (Addendum)
 Dental Form with Estimates of Radiation Dose  [name] Date of birth: 15-Mar-1951     Diagnosis:    ICD-10-CM   1. Squamous cell carcinoma of head and neck  C44.42     2. 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 [facility]) Staging form: Cervical Lymph Nodes and Unknown Primary Tumors of the Head and Neck, AJCC 8th Edition - Clinical stage from 08/27/2024: Stage IVA (cT0, cN2, cM0) - Signed by Izell Domino, MD on 08/28/2024 Stage prefix: Initial diagnosis   Prognosis: curative  Anticipated # of fractions: 30-35    Daily?: yes  # of weeks of radiotherapy: 6-7  Chemotherapy?: possible  Anticipated xerostomia:  Mild permanent    Pre-simulation needs:   Scatter protection / Other (dental advice, RT clearance)  Simulation:  not for at least a couple weeks due to other procedures  Other Notes:   Please contact Domino Izell, MD, with patient's disposition after evaluation and/or dental treatment. -----------------------------------  Domino Izell, MD

## 2024-08-29 ENCOUNTER — Telehealth: Payer: Self-pay | Admitting: Oncology

## 2024-08-29 ENCOUNTER — Telehealth: Payer: Self-pay | Admitting: *Deleted

## 2024-08-29 ENCOUNTER — Telehealth: Payer: Self-pay

## 2024-08-29 NOTE — Telephone Encounter (Signed)
   Pre-operative Risk Assessment    Patient Name: Diane Mayo  DOB: 27-Aug-1951 MRN: [id number]   Date of last office visit: 03/18/24 DR. [name] Date of next office visit: NONE   Request for Surgical Clearance    Procedure:  DIRECT LARYNGOSCOPY WITH Bx, POSSIBLE B/L TONSILLECTOMY  Date of Surgery:  Clearance 09/26/24                                Surgeon:  DR. PATEL Surgeon's Group or Practice Name:  [facility] Phone number:  [phone] Fax number:  [phone]     Type of Clearance Requested:   - Medical ; NONE INDICATED TO BE HELD    Type of Anesthesia:  General    Additional requests/questions:    [place], Simm   08/29/2024, 2:10 PM

## 2024-08-29 NOTE — Telephone Encounter (Signed)
 S/W pt and scheduled TELE Preop appt 09/09/24. Med Rec and Consent done.   Will update the surgeons office.

## 2024-08-29 NOTE — Addendum Note (Signed)
 Encounter addended by: Mohamed-Medani, Elverna LABOR, RN on: 08/29/2024 11:22 AM  Actions taken: Medication List reviewed

## 2024-08-29 NOTE — Telephone Encounter (Signed)
 Med Rec and Consent done    Patient Consent for Virtual Visit        Diane Mayo has provided verbal consent on 08/29/2024 for a virtual visit (video or telephone).   CONSENT FOR VIRTUAL VISIT FOR:  Diane Mayo  By participating in this virtual visit I agree to the following:  I hereby voluntarily request, consent and authorize [facility] and its employed or contracted physicians, physician assistants, nurse practitioners or other licensed health care professionals (the Practitioner), to provide me with telemedicine health care services (the "Services) as deemed necessary by the treating Practitioner. I acknowledge and consent to receive the Services by the Practitioner via telemedicine. I understand that the telemedicine visit will involve communicating with the Practitioner through live audiovisual communication technology and the disclosure of certain medical information by electronic transmission. I acknowledge that I have been given the opportunity to request an in-person assessment or other available alternative prior to the telemedicine visit and am voluntarily participating in the telemedicine visit.  I understand that I have the right to withhold or withdraw my consent to the use of telemedicine in the course of my care at any time, without affecting my right to future care or treatment, and that the Practitioner or I may terminate the telemedicine visit at any time. I understand that I have the right to inspect all information obtained and/or recorded in the course of the telemedicine visit and may receive copies of available information for a reasonable fee.  I understand that some of the potential risks of receiving the Services via telemedicine include:  Delay or interruption in medical evaluation due to technological equipment failure or disruption; Information transmitted may not be sufficient (e.g. poor resolution of images) to allow for appropriate medical decision  making by the Practitioner; and/or  In rare instances, security protocols could fail, causing a breach of personal health information.  Furthermore, I acknowledge that it is my responsibility to provide information about my medical history, conditions and care that is complete and accurate to the best of my ability. I acknowledge that Practitioner's advice, recommendations, and/or decision may be based on factors not within their control, such as incomplete or inaccurate data provided by me or distortions of diagnostic images or specimens that may result from electronic transmissions. I understand that the practice of medicine is not an exact science and that Practitioner makes no warranties or guarantees regarding treatment outcomes. I acknowledge that a copy of this consent can be made available to me via my patient portal [facility] MyChart), or I can request a printed copy by calling the office of  [facility].    I understand that my insurance will be billed for this visit.   I have read or had this consent read to me. I understand the contents of this consent, which adequately explains the benefits and risks of the Services being provided via telemedicine.  I have been provided ample opportunity to ask questions regarding this consent and the Services and have had my questions answered to my satisfaction. I give my informed consent for the services to be provided through the use of telemedicine in my medical care

## 2024-08-29 NOTE — Telephone Encounter (Signed)
   Name: Diane Mayo  DOB: [birth year]  MRN: [id number]  Primary Cardiologist: Madonna Large, DO   Preoperative team, please contact this patient and set up a phone call appointment for further preoperative risk assessment. Please obtain consent and complete medication review. Thank you for your help.  I confirm that guidance regarding antiplatelet and oral anticoagulation therapy has been completed and, if necessary, noted below.  I also confirmed the patient resides in the state of [place] . As per [facility] Medical Board telemedicine laws, the patient must reside in the state in which the provider is licensed.   Lamarr Satterfield, NP 08/29/2024, 2:20 PM [facility]

## 2024-08-29 NOTE — Telephone Encounter (Signed)
 Rescheduled appointments per 9/4 secure chat. Talked with the patient and she is aware of the changes made to her upcoming appointments.

## 2024-08-29 NOTE — Telephone Encounter (Signed)
 Surgery clearance faxed to Dr. Michele. Patient informed.

## 2024-09-04 ENCOUNTER — Ambulatory Visit (INDEPENDENT_AMBULATORY_CARE_PROVIDER_SITE_OTHER): Admitting: Otolaryngology

## 2024-09-05 ENCOUNTER — Other Ambulatory Visit

## 2024-09-05 ENCOUNTER — Ambulatory Visit: Admitting: Oncology

## 2024-09-07 NOTE — Progress Notes (Addendum)
 [facility]  OTHER PROVIDERS? ?PCP:?No primary care provider on file. Radiation Oncologist: Lauraine Golden, MD ?? HISTORY OF PRESENT ILLNESS? This is a pleasant 73 y.o. 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 Patient was accompanied by her husband  Diane Mayo presents for dental clearance prior to HNRT and for impressions for fabrication of radiation mouth guards/ fluoride trays. Patient visits her dentist routinely and last visit was a month ago for dental cleaning. Patient is planned for HN RT CT sim about 4 weeks after planned R tonsillectomy and neck dissection. Currently, she denies having sensitivity, discomfort, difficulty chewing, opening/closing the mouth, or swallowing. She does however report oral dryness. She aims to drink 64 ounces of water a day but has not tried any OTC dry mouth products.  ? ?Medical History[1] Problem List[2] Surgical History[3] ???Medications Ordered Prior to Encounter[4] ??Allergies[5] Social History   Socioeconomic History  . Marital status: Not on file    Spouse name: Not on file  . Number of children: Not on file  . Years of education: Not on file  . Highest education level: Not on file  Occupational History  . Not on file  Tobacco Use  . Smoking status: Not on file  . Smokeless tobacco: Not on file  Substance and Sexual Activity  . Alcohol  use: Not on file  . Drug use: Not on file  . Sexual activity: Not on file  Other Topics Concern  . Not on file  Social History Narrative  . Not on file   Social Drivers of Health   Food Insecurity: Not on file  Transportation Needs: Not on file  Housing Stability: Not on file   Family History[6]  The patient denies having been in a relationship in which threatened or physically hurt.    PHYSICAL  EXAMINATION? Visit Vitals Pulse 67  SpO2 96%  Smoking Status Never Assessed   Extraoral Examination? R cervical lymphadenopathy Patient is well appearing, alert and oriented to time and place.  Patient appeared well nourished and in no distress.  There was no hoarseness.  The temporomandibular joint examination was within normal limits without tenderness.  Palpation of the masseter, temporalis, sternocleidomastoid and trapezius muscles was within normal limits.  Muscles of facial expression within normal limits.  There was normal mouth opening and range of motion.  The skin of the head and neck was within normal limits.   Intraoral Examination? The dentition was grossly intact and within normal limits.  Dry oral mucosa with frothy saliva? There were otherwise no mucosal lesions or swelling of the oropharynx, hard palate, soft palate, buccal mucosa, lip mucosa, gingiva, tongue, or floor of mouth.  Major salivary glands palpated and ducts were patent.    Imaging: A panoramic radiograph was taken on 09/08/24. Generalized horizontal bone loss. The condyles appear symmetric. The condylar margins are intact and have no signs of degenerative remodeling. The cortical margin of mandibular bone appears to be intact. The dentition appears to be restored with dental restorations. There appears to be no signs of dental pathology.    ASSESSMENT? This 73 y.o. female presents with:  SCC of head and neck with unknown primary site, planned for HN RT requires radiation mouthguards/scatter prevention devices to prevent radiation therapy induced oral mucositis.  Dentition and periodontium within normal limits. No signs of  acute odontogenic infection Hyposalivation with xerostomia in the setting of polypharmacy Discussed the oral complications of radiation therapy including mucositis, hyposalivation, high caries risk and risk of osteoradionecrosis of the jaw and that any extractions after radiotherapy in the  future need to be done with antibiotic coverage.  Procedure: Impressions made of maxillary and mandibular arches for fabrication of radiation mouthguards. Radiation upper and lower mouthguards/ scatter prevention devices delivered.  ?? PLAN? Patient is cleared for HNRT. She received literature on effects of radiation on the mouth and jawbones. Wear the radiation mouthguards/scatter prevention devices during the radiation therapy sessions. After completion of all the 6 weeks of radiation therapy treatment, these mouthguards can be used as fluoride trays. Apply thin layer of fluoride gel inside the trays and wear them for 20 minutes nightly. Prescribed high fluoride tooth gel to be started after completion of RT. If the mouth becomes sore during RT, patient will switch to a children's toothpaste. Take 1 tablespoon of coconut, olive or sesame oil and swish it gently in your mouth for 5-10 minutes without swallowing. Spit it into the trash, rinse well with warm water (or salt water), and then brush your teeth.  Increase hydration to 64 ounces of water a day. Return for a follow-up on as needed basis. Patient knows to contact me with questions or concerns  Diane Mayo, DDS, MS Diplomate American Board of Oral Medicine Diplomate American Board of Orofacial Pain          [1] History reviewed. No pertinent past medical history. [2] Patient Active Problem List Diagnosis  . Acute cystitis with hematuria  . Bilateral carpal tunnel syndrome  . Chronic interstitial cystitis  . Acute pulmonary edema (CMS/HCC)  . Burning with urination  . Candidal vulvovaginitis  . Diabetes mellitus (CMS/HCC)  . Essential hypertension  . Frequency of urination  . History of urinary tract infection  . Hypertriglyceridemia  . LBBB (left bundle branch block)  . Menopausal symptom  . Numbness  . Obesity  . Primary osteoarthritis of both hands  . Secondary squamous cell carcinoma of head and neck with  unknown primary site  . Sepsis (CMS/HCC)  . Spinal stenosis of lumbar region  . Stiffness of finger joint of right hand  . Trigger ring finger of right hand  [3] History reviewed. No pertinent surgical history. [4] Current Outpatient Medications on File Prior to Visit  Medication Sig Dispense Refill  . allopurinoL  (ZYLOPRIM ) 300 mg tablet Take 1 tablet by mouth 1 (one) time each day.    . amLODIPine  (NORVASC ) 10 mg tablet Take 10 mg by mouth 1 (one) time each day in the morning.    . cetirizine (ZyrTEC) 10 mg tablet Take 10 mg by mouth in the morning.    . fenofibrate  (TRICOR ) 48 mg tablet Take 1 tablet by mouth 1 (one) time each day.    . hydroCHLOROthiazide  (MICROZIDE ) 12.5 mg capsule Take 12.5 mg by mouth 1 (one) time each day in the morning.    . icosapent  ethyL 1 gram capsule Take 2 capsules by mouth in the morning and at bedtime.    . irbesartan  (AVAPRO ) 300 mg tablet Take 300 mg by mouth 1 (one) time each day in the morning.    . melatonin tablet Take by mouth.    . metFORMIN  (GLUCOPHAGE ) 1,000 mg tablet take 1 tablet by mouth twice daily with a meal    . metoprolol  tartrate (LOPRESSOR ) 25 mg tablet Take 75 mg by mouth in the morning and  at bedtime.    . nystatin  (MYCOSTATIN ) cream Apply topically if needed.    . pantoprazole  (PROTONIX ) 40 mg EC tablet Take 1 tablet by mouth in the morning and at bedtime.    . rosuvastatin  (CRESTOR ) 10 mg tablet Take 1 tablet by mouth 1 (one) time each day.    . trimethoprim  (TRIMPEX ) 100 mg tablet Take 100 mg by mouth 1 (one) time each day.    . turmeric/turmeric ext/pepr ext (turmeric-turmeric ext-pepper) 500-3 mg capsule Take 1 tablet by mouth 1 (one) time each day.    . valACYclovir (VALTREX) 500 mg tablet Take 500 mg by mouth 1 (one) time each day.     No current facility-administered medications on file prior to visit.  [5] Allergies Allergen Reactions  . Lisinopril Cough  . Miconazole Itching and Swelling  . Other Other  . Quinapril  Hcl Other  . Sitagliptin Other  . Tioconazole Other  . Penicillins Other and Rash  [6] No family history on file.

## 2024-09-08 ENCOUNTER — Ambulatory Visit

## 2024-09-08 ENCOUNTER — Ambulatory Visit: Admitting: Radiation Oncology

## 2024-09-09 ENCOUNTER — Ambulatory Visit: Attending: Cardiology

## 2024-09-09 ENCOUNTER — Ambulatory Visit (INDEPENDENT_AMBULATORY_CARE_PROVIDER_SITE_OTHER): Admitting: Otolaryngology

## 2024-09-09 DIAGNOSIS — Z01810 Encounter for preprocedural cardiovascular examination: Secondary | ICD-10-CM

## 2024-09-09 NOTE — Progress Notes (Signed)
 Virtual Visit via Telephone Note   Because of Diane Mayo co-morbid illnesses, she is at least at moderate risk for complications without adequate follow up.  This format is felt to be most appropriate for this patient at this time.  Due to technical limitations with video connection (technology), today's appointment will be conducted as an audio only telehealth visit, and Diane Mayo verbally agreed to proceed in this manner.   All issues noted in this document were discussed and addressed.  No physical exam could be performed with this format.  Evaluation Performed:  Preoperative cardiovascular risk assessment _____________   Date:  09/09/2024   Patient ID:  Diane Mayo, DOB 03-Apr-1951, MRN [id number] Patient Location:  Home Provider location:   Office  Primary Care Provider:  Sun, Vyvyan, MD Primary Cardiologist:  Madonna Large, DO  Chief Complaint / Patient Profile  73 y.o. y/o female with a h/o mild nonobstructive CAD, heart failure with mildly reduced EF, hypertension, left bundle branch block, type 2 diabetes mellitus, anemia, mitral regurgitation, coronary artery calcification who is pending DIRECT LARYNGOSCOPY WITH Bx, POSSIBLE B/L TONSILLECTOMY  and presents today for telephonic preoperative cardiovascular risk assessment. History of Present Illness  Diane Mayo is a 73 y.o. female who presents via audio/video conferencing for a telehealth visit today.  Pt was last seen in cardiology clinic on 03/18/2024 by Dr. Large.  At that time Diane Mayo was doing well.  The patient is now pending procedure as outlined above. Since her last visit, she has remained stable from cardiac standpoint. Today she denies chest pain, shortness of breath, lower extremity edema, fatigue, palpitations, melena, hematuria, hemoptysis, diaphoresis, weakness, presyncope, syncope, orthopnea, and PND.  She is able to achieve greater than 4 METS of activity. Past Medical History    Past Medical History:   Diagnosis Date   Anemia    Diabetes mellitus without complication (HCC)    Type 2   Genital herpes    GERD (gastroesophageal reflux disease)    Hypertension    Interstitial cystitis    Left bundle branch block (LBBB)    chronic   Leukocytosis    Osteoarthritis    Plantar fasciitis    Past Surgical History:  Procedure Laterality Date   BACK SURGERY     CARPAL TUNNEL RELEASE Bilateral    CESAREAN SECTION     twice 1978 and 1987   DILATION AND CURETTAGE OF UTERUS  1975   EYE SURGERY Bilateral 2021   cataracts removed   SHOULDER SURGERY Right 2012   rotator cuff repair   TUBAL LIGATION  1987    Allergies  Allergies  Allergen Reactions   Penicillins Shortness Of Breath, Itching and Rash   Accupril [Quinapril Hcl] Cough   Januvia [Sitagliptin] Other (See Comments)    thrush   Monistat [Miconazole] Itching and Swelling    Monistat    Tioconazole Rash    Other reaction(s): rash   Zestril [Lisinopril] Cough    Home Medications    Prior to Admission medications   Medication Sig Start Date End Date Taking? Authorizing Provider  acetaminophen  (TYLENOL ) 500 MG tablet Take 500-1,000 mg by mouth every 6 (six) hours as needed (pain.).    [provider]  allopurinol  (ZYLOPRIM ) 300 MG tablet Take 300 mg by mouth in the morning.    [provider]  amLODipine  (NORVASC ) 10 MG tablet Take 10 mg by mouth in the morning.    [provider]  cetirizine (  ZYRTEC) 10 MG tablet Take 10 mg by mouth in the morning.    [provider]  colchicine  0.6 MG tablet TAKE 1 TABLET BY MOUTH TWICE A DAY 08/25/19   [name], DPM  fenofibrate  (TRICOR ) 48 MG tablet Take 1 tablet (48 mg total) by mouth daily. 11/26/23   [name]., NP  hydrochlorothiazide  (MICROZIDE ) 12.5 MG capsule Take 12.5 mg by mouth in the morning. 12/06/21   [provider]  icosapent  Ethyl (VASCEPA ) 1 g capsule Take 2 capsules (2 g total) by mouth 2 (two) times daily.  03/18/24   [name], [name], DO  irbesartan  (AVAPRO ) 300 MG tablet TAKE 1 TABLET BY MOUTH DAILY 01/18/21   [name], MD  metFORMIN  (GLUCOPHAGE ) 1000 MG tablet Take 1,000 mg by mouth 2 (two) times daily with a meal.    [provider]  metoprolol  tartrate (LOPRESSOR ) 25 MG tablet Take 75 mg by mouth 2 (two) times daily. 10/18/19   [provider]  nystatin  cream (MYCOSTATIN ) Apply 1 application topically daily as needed for rash.    [provider]  pantoprazole  (PROTONIX ) 40 MG tablet Take 40 mg by mouth daily. 09/26/23   [provider]  Pentosan Polysulfate Sodium  200 MG CPDR Take 200 mg by mouth 2 (two) times daily.    [provider]  polyvinyl alcohol  (LIQUIFILM TEARS) 1.4 % ophthalmic solution Place 1 drop into both eyes as needed for dry eyes.    [provider]  rosuvastatin  (CRESTOR ) 10 MG tablet Take 1 tablet (10 mg total) by mouth daily. 11/26/23   Dick, Ernest H Jr., NP  sucralfate (CARAFATE) 1 g tablet Take 1 g by mouth 4 (four) times daily -  with meals and at bedtime.    [provider]  triamcinolone  cream (KENALOG ) 0.1 % Apply 1 Application topically as needed.    [provider]  trimethoprim  (TRIMPEX ) 100 MG tablet Take 100 mg by mouth every evening. 12/13/20   [provider]  valACYclovir (VALTREX) 500 MG tablet Take 500 mg by mouth 2 (two) times daily as needed (outbreaks (blisters)).    [provider]    Physical Exam  Vital Signs:  CATHYRN Mayo does not have vital signs available for review today. Given telephonic nature of communication, physical exam is limited. AAOx3. NAD. Normal affect.  Speech and respirations are unlabored. Accessory Clinical Findings  None Assessment & Plan    1.  Preoperative Cardiovascular Risk Assessment: Ms. Maj's perioperative risk of a major cardiac event is 0.9% according to the Revised Cardiac Risk Index (RCRI).  Therefore, she is at low  risk for perioperative complications.   Her functional capacity is good at 5.07 METs according to the [place] Activity Status Index (DASI). Recommendations: According to ACC/AHA guidelines, no further cardiovascular testing needed.  The patient may proceed to surgery at acceptable risk.   Antiplatelet and/or Anticoagulation Recommendations: None requested.  The patient was advised that if she develops new symptoms prior to surgery to contact our office to arrange for a follow-up visit, and she verbalized understanding. A copy of this note will be routed to requesting surgeon.  Time:   Today, I have spent 10 minutes with the patient with telehealth technology discussing medical history, symptoms, and management plan.    Allix Blomquist D Jennette Leask, NP  09/09/2024, 4:40 PM

## 2024-09-10 ENCOUNTER — Encounter (HOSPITAL_COMMUNITY): Payer: Self-pay | Admitting: Pathology

## 2024-09-12 LAB — CYTOLOGY - NON PAP

## 2024-09-19 DIAGNOSIS — R300 Dysuria: Secondary | ICD-10-CM | POA: Diagnosis not present

## 2024-09-22 ENCOUNTER — Ambulatory Visit (INDEPENDENT_AMBULATORY_CARE_PROVIDER_SITE_OTHER): Admitting: Otolaryngology

## 2024-09-22 ENCOUNTER — Encounter (INDEPENDENT_AMBULATORY_CARE_PROVIDER_SITE_OTHER): Payer: Self-pay | Admitting: Otolaryngology

## 2024-09-22 VITALS — BP 137/75 | HR 72 | Ht 60.0 in | Wt 208.0 lb

## 2024-09-22 DIAGNOSIS — C4492 Squamous cell carcinoma of skin, unspecified: Secondary | ICD-10-CM | POA: Diagnosis not present

## 2024-09-22 DIAGNOSIS — R221 Localized swelling, mass and lump, neck: Secondary | ICD-10-CM

## 2024-09-25 ENCOUNTER — Encounter (HOSPITAL_COMMUNITY): Payer: Self-pay | Admitting: *Deleted

## 2024-09-25 ENCOUNTER — Other Ambulatory Visit: Payer: Self-pay

## 2024-09-25 NOTE — Progress Notes (Signed)
 Anesthesia Chart Review: Same day workup  73 year old female follows with cardiology for history of mild nonobstructive CAD, HFmrEF, HTN, left bundle branch block.  Echo 08/2023 showed LVEF 50 to 55%, grade 2 DD, normal RV, no significant valvular abnormalities.  Coronary CTA 03/2022 showed mild nonobstructive CAD.  Seen by Diane West, NP on 09/09/2024 for preop evaluation.  Per note, 1.  Preoperative Cardiovascular Risk Assessment: Diane Mayo's perioperative risk of a major cardiac event is 0.9% according to the Revised Cardiac Risk Index (RCRI).  Therefore, she is at low risk for perioperative complications.   Her functional capacity is good at 5.07 METs according to the Duke Activity Status Index (DASI). Recommendations: According to ACC/AHA guidelines, no further cardiovascular testing needed.  The patient may proceed to surgery at acceptable risk.  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  Seen by otolaryngologist Diane Mayo on 08/14/2024.  Transnasal fiberoptic laryngoscopy at that time showed, Findings: The nasal cavity and nasopharynx did not reveal any masses or lesions, mucosa appeared to be without obvious lesions. The tongue base, pharyngeal walls, piriform sinuses, vallecula, epiglottis and postcricoid region are normal in appearance - small nodule/heaped up mucosa right AE fold area but otherwise no lesions appreciated. The visualized portion of the subglottis and proximal trachea is widely patent. The vocal folds are mobile bilaterally. There are no lesions on the free edge of the vocal folds nor elsewhere in the larynx worrisome for malignancy.  Other pertinent history includes non-insulin -dependent DM2, GERD on PPI.  Patient will need day of surgery labs and evaluation.  EKG 03/18/2024: Sinus rhythm with 1st degree A-V block with Premature atrial complexes. Rate 73. Left axis deviation. Left bundle branch block.  TTE 08/31/2023: 1. Left  ventricular ejection fraction, by estimation, is 50 to 55%. Left  ventricular ejection fraction by 3D volume is 50 %. The left ventricle has  low normal function. The left ventricle has no regional wall motion  abnormalities. Left ventricular  diastolic parameters are consistent with Grade II diastolic dysfunction  (pseudonormalization).   2. Right ventricular systolic function is normal. The right ventricular  size is normal.   3. The mitral valve is normal in structure. No evidence of mitral valve  regurgitation. No evidence of mitral stenosis.   4. The aortic valve is normal in structure. Aortic valve regurgitation is  not visualized. No aortic stenosis is present.   5. The inferior vena cava is normal in size with greater than 50%  respiratory variability, suggesting right atrial pressure of 3 mmHg.   Comparison(s): No significant change from prior study.   Coronary CTA 04/20/2022: IMPRESSION: 1. Coronary calcium  score of 457. This was 62 percentile for age-, sex, and race-matched controls.   2. Normal coronary origin with right dominance.   3. Mild CAD in the LAD and diagonal as outlined.   4. Aortic atherosclerosis.   RECOMMENDATIONS: CAD-RADS 2: Mild non-obstructive CAD (25-49%). Consider non-atherosclerotic causes of chest pain. Consider preventive therapy and risk factor modification.     Diane Mayo [facility] Short Stay Center/Anesthesiology Phone [phone] 09/25/2024 3:57 PM

## 2024-09-25 NOTE — Progress Notes (Signed)
 SDW CALL  Patient was given pre-op instructions over the phone. The opportunity was given for the patient to ask questions. No further questions asked. Patient verbalized understanding of instructions given.   PCP - Sun, Vyvyan, MD Cardiologist - Michele Richardson, DO -pre operative clearance given 09/09/24 Gastroenterologist - Parag Brahmbhatt,MD  PPM/ICD - denies Device Orders -  Rep Notified -   Chest x-ray - na EKG - 03/18/24 Stress Test -  ECHO - 09/20/23 Cardiac Cath - denies  Sleep Study -denies CPAP - no  Fasting Blood Sugar - 160's Checks Blood Sugar 2-3 times a week  WHAT DO I DO ABOUT MY DIABETES MEDICATION?   Do not take oral diabetes medicines (pills) the morning of surgery. Do not take Metformin (Glucophage ) the morning of surgery.  Check your blood sugar the morning of your surgery when you wake up and every 2 hours until you get to the Short Stay unit.  If your blood sugar is less than 70 mg/dL, you will need to treat for low blood sugar: Do not take insulin . Treat a low blood sugar (less than 70 mg/dL) with  cup of clear juice (cranberry or apple), 4 glucose tablets, OR glucose gel. Recheck blood sugar in 15 minutes after treatment (to make sure it is greater than 70 mg/dL). If your blood sugar is not greater than 70 mg/dL on recheck, call [phone] for further instructions. Report your blood sugar to the short stay nurse when you get to Short Stay.   Blood Thinner Instructions:na Aspirin  Instructions:na  ERAS Protcol -NPO PRE-SURGERY Ensure or G2-   COVID TEST- na   Anesthesia review: yes HTN,CHF,DM  Patient denies shortness of breath, fever, cough and chest pain over the phone call   Special instructions:    Oral Hygiene is also important to reduce your risk of infection.  Remember - BRUSH YOUR TEETH THE MORNING OF SURGERY WITH YOUR REGULAR TOOTHPASTE

## 2024-09-25 NOTE — Anesthesia Preprocedure Evaluation (Signed)
 Anesthesia Evaluation  Patient identified by MRN, date of birth, ID band Patient awake    Reviewed: Allergy & Precautions, NPO status , Patient's Chart, lab work & pertinent test results  Airway Mallampati: II  TM Distance: >3 FB Neck ROM: Full    Dental  (+) Teeth Intact, Dental Advisory Given   Pulmonary former smoker   breath sounds clear to auscultation       Cardiovascular hypertension, Pt. on medications and Pt. on home beta blockers + dysrhythmias  Rhythm:Regular Rate:Normal     Neuro/Psych negative neurological ROS  negative psych ROS   GI/Hepatic ,GERD  Medicated,,(+) Hepatitis -  Endo/Other  diabetes, Type 2, Oral Hypoglycemic Agents    Renal/GU negative Renal ROS     Musculoskeletal  (+) Arthritis ,    Abdominal   Peds  Hematology  (+) Blood dyscrasia, anemia   Anesthesia Other Findings   Reproductive/Obstetrics                              Anesthesia Physical Anesthesia Plan  ASA: 3  Anesthesia Plan: General   Post-op Pain Management: Tylenol  PO (pre-op)* and Toradol  IV (intra-op)*   Induction: Intravenous  PONV Risk Score and Plan: 4 or greater and Ondansetron , Dexamethasone , Midazolam  and Scopolamine patch - Pre-op  Airway Management Planned: Oral ETT  Additional Equipment: None  Intra-op Plan:   Post-operative Plan: Extubation in OR  Informed Consent: I have reviewed the patients History and Physical, chart, labs and discussed the procedure including the risks, benefits and alternatives for the proposed anesthesia with the patient or authorized representative who has indicated his/her understanding and acceptance.     Dental advisory given  Plan Discussed with: CRNA  Anesthesia Plan Comments: (PAT note by [name], PA-C: 73 year old female follows with cardiology for history of mild nonobstructive CAD, HFmrEF, HTN, left bundle branch block.  Echo  08/2023 showed LVEF 50 to 55%, grade 2 DD, normal RV, no significant valvular abnormalities.  Coronary CTA 03/2022 showed mild nonobstructive CAD.  Seen by [name], NP on 09/09/2024 for preop evaluation.  Per note, 1.  Preoperative Cardiovascular Risk Assessment: Diane Mayo's perioperative risk of a major cardiac event is 0.9% according to the Revised Cardiac Risk Index (RCRI).  Therefore, she is at low risk for perioperative complications.   Her functional capacity is good at 5.07 METs according to the Duke Activity Status Index (DASI). Recommendations: According to ACC/AHA guidelines, no further cardiovascular testing needed.  The patient may proceed to surgery at acceptable risk.  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  Seen by otolaryngologist Dr. Tobie on 08/14/2024.  Transnasal fiberoptic laryngoscopy at that time showed, Findings: The nasal cavity and nasopharynx did not reveal any masses or lesions, mucosa appeared to be without obvious lesions. The tongue base, pharyngeal walls, piriform sinuses, vallecula, epiglottis and postcricoid region are normal in appearance - small nodule/heaped up mucosa right AE fold area but otherwise no lesions appreciated. The visualized portion of the subglottis and proximal trachea is widely patent. The vocal folds are mobile bilaterally. There are no lesions on the free edge of the vocal folds nor elsewhere in the larynx worrisome for malignancy.  Other pertinent history includes non-insulin -dependent DM2, GERD on PPI.  Patient will need day of surgery labs and evaluation.  EKG 03/18/2024: Sinus rhythm with 1st degree A-V block with Premature atrial complexes. Rate 73. Left axis deviation.  Left bundle branch block.  TTE 08/31/2023: 1. Left ventricular ejection fraction, by estimation, is 50 to 55%. Left  ventricular ejection fraction by 3D volume is 50 %. The left ventricle has  low normal function. The left  ventricle has no regional wall motion  abnormalities. Left ventricular  diastolic parameters are consistent with Grade II diastolic dysfunction  (pseudonormalization).  2. Right ventricular systolic function is normal. The right ventricular  size is normal.  3. The mitral valve is normal in structure. No evidence of mitral valve  regurgitation. No evidence of mitral stenosis.  4. The aortic valve is normal in structure. Aortic valve regurgitation is  not visualized. No aortic stenosis is present.  5. The inferior vena cava is normal in size with greater than 50%  respiratory variability, suggesting right atrial pressure of 3 mmHg.   Comparison(s): No significant change from prior study.   Coronary CTA 04/20/2022: IMPRESSION: 1. Coronary calcium  score of 457. This was 59 percentile for age-, sex, and race-matched controls.  2. Normal coronary origin with right dominance.  3. Mild CAD in the LAD and diagonal as outlined.  4. Aortic atherosclerosis.  RECOMMENDATIONS: CAD-RADS 2: Mild non-obstructive CAD (25-49%). Consider non-atherosclerotic causes of chest pain. Consider preventive therapy and risk factor modification.  )         Anesthesia Quick Evaluation

## 2024-09-26 ENCOUNTER — Other Ambulatory Visit: Payer: Self-pay

## 2024-09-26 ENCOUNTER — Inpatient Hospital Stay (HOSPITAL_COMMUNITY): Payer: Self-pay | Admitting: Physician Assistant

## 2024-09-26 ENCOUNTER — Encounter (HOSPITAL_COMMUNITY)
Admission: RE | Disposition: A | Discharge status: Home / Self Care | Payer: Self-pay | Source: Home / Self Care | Attending: Otolaryngology

## 2024-09-26 ENCOUNTER — Encounter (HOSPITAL_COMMUNITY): Payer: Self-pay

## 2024-09-26 ENCOUNTER — Inpatient Hospital Stay (HOSPITAL_BASED_OUTPATIENT_CLINIC_OR_DEPARTMENT_OTHER): Payer: Self-pay | Admitting: Physician Assistant

## 2024-09-26 ENCOUNTER — Telehealth (INDEPENDENT_AMBULATORY_CARE_PROVIDER_SITE_OTHER): Payer: Self-pay | Admitting: Otolaryngology

## 2024-09-26 ENCOUNTER — Observation Stay (HOSPITAL_COMMUNITY)
Admission: RE | Admit: 2024-09-26 | Discharge: 2024-09-27 | Disposition: A | Discharge status: Home / Self Care | Attending: Otolaryngology | Admitting: Otolaryngology

## 2024-09-26 DIAGNOSIS — C770 Secondary and unspecified malignant neoplasm of lymph nodes of head, face and neck: Secondary | ICD-10-CM | POA: Diagnosis not present

## 2024-09-26 DIAGNOSIS — C4442 Squamous cell carcinoma of skin of scalp and neck: Secondary | ICD-10-CM | POA: Diagnosis present

## 2024-09-26 DIAGNOSIS — C801 Malignant (primary) neoplasm, unspecified: Secondary | ICD-10-CM | POA: Diagnosis not present

## 2024-09-26 DIAGNOSIS — I10 Essential (primary) hypertension: Secondary | ICD-10-CM

## 2024-09-26 DIAGNOSIS — J9601 Acute respiratory failure with hypoxia: Secondary | ICD-10-CM | POA: Diagnosis not present

## 2024-09-26 DIAGNOSIS — C4492 Squamous cell carcinoma of skin, unspecified: Secondary | ICD-10-CM | POA: Diagnosis not present

## 2024-09-26 DIAGNOSIS — J351 Hypertrophy of tonsils: Secondary | ICD-10-CM | POA: Diagnosis not present

## 2024-09-26 DIAGNOSIS — C7989 Secondary malignant neoplasm of other specified sites: Principal | ICD-10-CM | POA: Diagnosis present

## 2024-09-26 DIAGNOSIS — E119 Type 2 diabetes mellitus without complications: Secondary | ICD-10-CM | POA: Insufficient documentation

## 2024-09-26 DIAGNOSIS — Z87891 Personal history of nicotine dependence: Secondary | ICD-10-CM | POA: Insufficient documentation

## 2024-09-26 HISTORY — DX: Nonalcoholic steatohepatitis (NASH): K75.81

## 2024-09-26 HISTORY — PX: RADICAL NECK DISSECTION: SHX2284

## 2024-09-26 HISTORY — PX: TONSILLECTOMY: SHX5217

## 2024-09-26 HISTORY — PX: DIRECT LARYNGOSCOPY: SHX5326

## 2024-09-26 HISTORY — DX: Family history of other specified conditions: Z84.89

## 2024-09-26 LAB — GLUCOSE, CAPILLARY
Glucose-Capillary: 150 mg/dL — ABNORMAL HIGH (ref 70–99)
Glucose-Capillary: 100 mg/dL — ABNORMAL HIGH (ref 70–99)
Glucose-Capillary: 200 mg/dL — ABNORMAL HIGH (ref 70–99)
Glucose-Capillary: 237 mg/dL — ABNORMAL HIGH (ref 70–99)

## 2024-09-26 LAB — CBC
HCT: 34.9 % — ABNORMAL LOW (ref 36.0–46.0)
Hemoglobin: 11.1 g/dL — ABNORMAL LOW (ref 12.0–15.0)
MCH: 26.6 pg (ref 26.0–34.0)
MCHC: 31.8 g/dL (ref 30.0–36.0)
MCV: 83.5 fL (ref 80.0–100.0)
Platelets: 232 K/uL (ref 150–400)
RBC: 4.18 MIL/uL (ref 3.87–5.11)
RDW: 15.6 % — ABNORMAL HIGH (ref 11.5–15.5)
WBC: 7.9 K/uL (ref 4.0–10.5)
nRBC: 0 % (ref 0.0–0.2)

## 2024-09-26 LAB — BASIC METABOLIC PANEL WITH GFR
Anion gap: 13 (ref 5–15)
BUN: 11 mg/dL (ref 8–23)
CO2: 20 mmol/L — ABNORMAL LOW (ref 22–32)
Calcium: 9.1 mg/dL (ref 8.9–10.3)
Chloride: 104 mmol/L (ref 98–111)
Creatinine, Ser: 0.95 mg/dL (ref 0.44–1.00)
GFR, Estimated: >60 mL/min (ref >60)
Glucose, Bld: 141 mg/dL — ABNORMAL HIGH (ref 70–99)
Potassium: 3.9 mmol/L (ref 3.5–5.1)
Sodium: 137 mmol/L (ref 135–145)

## 2024-09-26 SURGERY — LARYNGOSCOPY, DIRECT
Anesthesia: General | Laterality: Right

## 2024-09-26 MED ORDER — EPHEDRINE SULFATE-NACL 50-0.9 MG/10ML-% IV SOSY
PREFILLED_SYRINGE | INTRAVENOUS | Status: DC | PRN
  Administered 2024-09-26: 5 mg via INTRAVENOUS

## 2024-09-26 MED ORDER — HYDROMORPHONE HCL 1 MG/ML IJ SOLN
INTRAMUSCULAR | Status: AC
  Filled 2024-09-26: qty 1

## 2024-09-26 MED ORDER — LACTATED RINGERS IV SOLN: INTRAVENOUS | Status: DC

## 2024-09-26 MED ORDER — EPINEPHRINE HCL (NASAL) 0.1 % NA SOLN: NASAL | Status: DC | PRN

## 2024-09-26 MED ORDER — MIDAZOLAM HCL 2 MG/2ML IJ SOLN
INTRAMUSCULAR | Status: DC | PRN
  Administered 2024-09-26: 2 mg via INTRAVENOUS

## 2024-09-26 MED ORDER — BACITRACIN ZINC 500 UNIT/GM EX OINT
TOPICAL_OINTMENT | CUTANEOUS | Status: AC
  Filled 2024-09-26: qty 28.35

## 2024-09-26 MED ORDER — OXYCODONE HCL 5 MG/5ML PO SOLN
5.0000 mg | ORAL | Status: DC | PRN
  Administered 2024-09-27: 5 mg via ORAL
  Administered 2024-09-27: 10 mg via ORAL
  Filled 2024-09-26 (×2): qty 10

## 2024-09-26 MED ORDER — SODIUM CHLORIDE 0.9 % IV SOLN
0.1000 ug/kg/min | INTRAVENOUS | Status: DC
  Filled 2024-09-26 (×2): qty 5000

## 2024-09-26 MED ORDER — METOPROLOL TARTRATE 50 MG PO TABS
75.0000 mg | ORAL_TABLET | Freq: Two times a day (BID) | ORAL | Status: DC
  Administered 2024-09-26 – 2024-09-27 (×2): 75 mg via ORAL
  Filled 2024-09-26 (×2): qty 1

## 2024-09-26 MED ORDER — DROPERIDOL 2.5 MG/ML IJ SOLN: 0.6250 mg | Freq: Once | INTRAMUSCULAR | Status: DC | PRN

## 2024-09-26 MED ORDER — LIDOCAINE-EPINEPHRINE 1 %-1:100000 IJ SOLN
INTRAMUSCULAR | Status: AC
  Filled 2024-09-26: qty 1

## 2024-09-26 MED ORDER — PANTOPRAZOLE SODIUM 40 MG PO TBEC
40.0000 mg | DELAYED_RELEASE_TABLET | Freq: Two times a day (BID) | ORAL | Status: DC
  Administered 2024-09-26 – 2024-09-27 (×2): 40 mg via ORAL
  Filled 2024-09-26 (×2): qty 1

## 2024-09-26 MED ORDER — ICOSAPENT ETHYL 1 G PO CAPS
2.0000 g | ORAL_CAPSULE | Freq: Two times a day (BID) | ORAL | Status: DC
  Filled 2024-09-26 (×4): qty 2

## 2024-09-26 MED ORDER — DEXMEDETOMIDINE HCL IN NACL 80 MCG/20ML IV SOLN
INTRAVENOUS | Status: DC | PRN
  Administered 2024-09-26: 8 ug via INTRAVENOUS

## 2024-09-26 MED ORDER — PROPOFOL 10 MG/ML IV BOLUS
INTRAVENOUS | Status: AC
  Filled 2024-09-26: qty 20

## 2024-09-26 MED ORDER — CEFAZOLIN SODIUM-DEXTROSE 2-3 GM-%(50ML) IV SOLR
INTRAVENOUS | Status: DC | PRN
Start: 2024-09-26 — End: 2024-09-26
  Administered 2024-09-26: 2 g via INTRAVENOUS

## 2024-09-26 MED ORDER — DEXAMETHASONE SODIUM PHOSPHATE 10 MG/ML IJ SOLN
INTRAMUSCULAR | Status: DC | PRN
  Administered 2024-09-26: 10 mg via INTRAVENOUS

## 2024-09-26 MED ORDER — ACETAMINOPHEN 650 MG RE SUPP: 650.0000 mg | Freq: Four times a day (QID) | RECTAL | Status: DC

## 2024-09-26 MED ORDER — DOCUSATE SODIUM 100 MG PO CAPS
100.0000 mg | ORAL_CAPSULE | Freq: Two times a day (BID) | ORAL | Status: DC
  Administered 2024-09-26 – 2024-09-27 (×2): 100 mg via ORAL
  Filled 2024-09-26 (×2): qty 1

## 2024-09-26 MED ORDER — ACETAMINOPHEN 500 MG PO TABS
1000.0000 mg | ORAL_TABLET | Freq: Once | ORAL | Status: AC
Start: 2024-09-26 — End: 2024-09-26
  Administered 2024-09-26: 1000 mg via ORAL
  Filled 2024-09-26: qty 2

## 2024-09-26 MED ORDER — ONDANSETRON HCL 4 MG/2ML IJ SOLN
INTRAMUSCULAR | Status: DC | PRN
Start: 2024-09-26 — End: 2024-09-26
  Administered 2024-09-26: 4 mg via INTRAVENOUS

## 2024-09-26 MED ORDER — LIDOCAINE 2% (20 MG/ML) 5 ML SYRINGE
INTRAMUSCULAR | Status: DC | PRN
  Administered 2024-09-26: 100 mg via INTRAVENOUS

## 2024-09-26 MED ORDER — FENTANYL CITRATE (PF) 250 MCG/5ML IJ SOLN
INTRAMUSCULAR | Status: AC
  Filled 2024-09-26: qty 5

## 2024-09-26 MED ORDER — VALACYCLOVIR HCL 500 MG PO TABS
500.0000 mg | ORAL_TABLET | Freq: Every day | ORAL | Status: DC
  Administered 2024-09-27: 500 mg via ORAL
  Filled 2024-09-26: qty 1

## 2024-09-26 MED ORDER — OXYCODONE HCL 5 MG/5ML PO SOLN: 5.0000 mg | Freq: Once | ORAL | Status: DC | PRN

## 2024-09-26 MED ORDER — HYDROMORPHONE HCL 1 MG/ML IJ SOLN
0.2500 mg | INTRAMUSCULAR | Status: DC | PRN
  Administered 2024-09-26 (×2): 0.5 mg via INTRAVENOUS

## 2024-09-26 MED ORDER — ACETAMINOPHEN 160 MG/5ML PO SOLN
1000.0000 mg | Freq: Four times a day (QID) | ORAL | Status: DC
  Administered 2024-09-26 – 2024-09-27 (×3): 1000 mg via ORAL
  Filled 2024-09-26 (×3): qty 40.6

## 2024-09-26 MED ORDER — SCOPOLAMINE 1 MG/3DAYS TD PT72
1.0000 | MEDICATED_PATCH | TRANSDERMAL | Status: DC
Start: 2024-09-26 — End: 2024-09-26
  Administered 2024-09-26: 1 mg via TRANSDERMAL
  Filled 2024-09-26: qty 1

## 2024-09-26 MED ORDER — ONDANSETRON HCL 4 MG/2ML IJ SOLN
4.0000 mg | INTRAMUSCULAR | Status: DC | PRN
Start: 2024-09-26 — End: 2024-09-27

## 2024-09-26 MED ORDER — CHLORHEXIDINE GLUCONATE 0.12 % MT SOLN
15.0000 mL | Freq: Once | OROMUCOSAL | Status: AC
  Administered 2024-09-26: 15 mL via OROMUCOSAL
  Filled 2024-09-26: qty 15

## 2024-09-26 MED ORDER — OXYCODONE HCL 5 MG PO TABS: 5.0000 mg | ORAL_TABLET | ORAL | 0 refills | Status: DC | PRN

## 2024-09-26 MED ORDER — ORAL CARE MOUTH RINSE: 15.0000 mL | Freq: Once | OROMUCOSAL | Status: AC

## 2024-09-26 MED ORDER — MIDAZOLAM HCL 2 MG/2ML IJ SOLN
INTRAMUSCULAR | Status: AC
  Filled 2024-09-26: qty 2

## 2024-09-26 MED ORDER — FLUORESCEIN SODIUM 1 MG OP STRP
ORAL_STRIP | OPHTHALMIC | Status: AC
  Filled 2024-09-26: qty 1

## 2024-09-26 MED ORDER — METOPROLOL TARTRATE 50 MG PO TABS
75.0000 mg | ORAL_TABLET | Freq: Once | ORAL | Status: AC
  Administered 2024-09-26: 75 mg via ORAL
  Filled 2024-09-26: qty 2

## 2024-09-26 MED ORDER — ACETAMINOPHEN 10 MG/ML IV SOLN: 1000.0000 mg | Freq: Once | INTRAVENOUS | Status: DC | PRN

## 2024-09-26 MED ORDER — TRIMETHOPRIM 100 MG PO TABS
100.0000 mg | ORAL_TABLET | Freq: Every evening | ORAL | Status: DC
  Filled 2024-09-26: qty 1

## 2024-09-26 MED ORDER — FENTANYL CITRATE (PF) 250 MCG/5ML IJ SOLN
INTRAMUSCULAR | Status: DC | PRN
  Administered 2024-09-26: 50 ug via INTRAVENOUS
  Administered 2024-09-26: 100 ug via INTRAVENOUS
  Administered 2024-09-26 (×2): 50 ug via INTRAVENOUS
  Administered 2024-09-26: 100 ug via INTRAVENOUS

## 2024-09-26 MED ORDER — EPINEPHRINE HCL (NASAL) 0.1 % NA SOLN
NASAL | Status: AC
  Filled 2024-09-26: qty 30

## 2024-09-26 MED ORDER — ONDANSETRON HCL 4 MG PO TABS: 4.0000 mg | ORAL_TABLET | ORAL | Status: DC | PRN

## 2024-09-26 MED ORDER — BACITRACIN ZINC 500 UNIT/GM EX OINT
1.0000 | TOPICAL_OINTMENT | Freq: Two times a day (BID) | CUTANEOUS | Status: DC
  Administered 2024-09-26 – 2024-09-27 (×2): 1 via TOPICAL
  Filled 2024-09-26 (×2): qty 28.35

## 2024-09-26 MED ORDER — PROPOFOL 10 MG/ML IV BOLUS
INTRAVENOUS | Status: DC | PRN
Start: 2024-09-26 — End: 2024-09-26
  Administered 2024-09-26 (×3): 50 mg via INTRAVENOUS
  Administered 2024-09-26: 150 mg via INTRAVENOUS
  Administered 2024-09-26 (×2): 50 mg via INTRAVENOUS

## 2024-09-26 MED ORDER — CEPHALEXIN 500 MG PO CAPS: 500.0000 mg | ORAL_CAPSULE | Freq: Two times a day (BID) | ORAL | 0 refills | Status: AC

## 2024-09-26 MED ORDER — 0.9 % SODIUM CHLORIDE (POUR BTL) OPTIME
TOPICAL | Status: DC | PRN
  Administered 2024-09-26: 1000 mL

## 2024-09-26 MED ORDER — BACITRACIN ZINC 500 UNIT/GM EX OINT
TOPICAL_OINTMENT | CUTANEOUS | 0 refills | Status: AC
Start: 2024-09-26 — End: 2025-09-26

## 2024-09-26 MED ORDER — INSULIN ASPART 100 UNIT/ML IJ SOLN
0.0000 [IU] | Freq: Three times a day (TID) | INTRAMUSCULAR | Status: DC
  Administered 2024-09-27 (×2): 4 [IU] via SUBCUTANEOUS

## 2024-09-26 MED ORDER — OXYCODONE HCL 5 MG PO TABS: 5.0000 mg | ORAL_TABLET | Freq: Once | ORAL | Status: DC | PRN

## 2024-09-26 MED ORDER — SODIUM CHLORIDE 0.9 % IV SOLN
0.1000 ug/kg/min | INTRAVENOUS | Status: DC
  Filled 2024-09-26: qty 2000

## 2024-09-26 MED ORDER — SENNA 8.6 MG PO TABS
1.0000 | ORAL_TABLET | Freq: Two times a day (BID) | ORAL | Status: DC
  Administered 2024-09-26 – 2024-09-27 (×2): 8.6 mg via ORAL
  Filled 2024-09-26 (×2): qty 1

## 2024-09-26 MED ORDER — HYDROCHLOROTHIAZIDE 12.5 MG PO TABS
12.5000 mg | ORAL_TABLET | Freq: Every morning | ORAL | Status: DC
  Administered 2024-09-27: 12.5 mg via ORAL
  Filled 2024-09-26 (×2): qty 1

## 2024-09-26 MED ORDER — ALBUMIN HUMAN 5 % IV SOLN: INTRAVENOUS | Status: DC | PRN

## 2024-09-26 MED ORDER — ALLOPURINOL 300 MG PO TABS
300.0000 mg | ORAL_TABLET | Freq: Every morning | ORAL | Status: DC
  Administered 2024-09-27: 300 mg via ORAL
  Filled 2024-09-26: qty 1

## 2024-09-26 MED ORDER — SODIUM CHLORIDE 0.9 % IV SOLN
0.1000 ug/kg/min | INTRAVENOUS | Status: AC
  Administered 2024-09-26: .1 ug/kg/min via INTRAVENOUS
  Filled 2024-09-26 (×2): qty 2000

## 2024-09-26 MED ORDER — ROCURONIUM BROMIDE 10 MG/ML (PF) SYRINGE
PREFILLED_SYRINGE | INTRAVENOUS | Status: DC | PRN
  Administered 2024-09-26: 70 mg via INTRAVENOUS

## 2024-09-26 MED ORDER — ACETAMINOPHEN 500 MG PO TABS: 1000.0000 mg | ORAL_TABLET | Freq: Four times a day (QID) | ORAL | 0 refills | Status: DC

## 2024-09-26 MED ORDER — SODIUM CHLORIDE 0.9 % IV SOLN: INTRAVENOUS | Status: AC

## 2024-09-26 MED ORDER — INSULIN ASPART 100 UNIT/ML IJ SOLN
0.0000 [IU] | INTRAMUSCULAR | Status: DC | PRN
  Administered 2024-09-26: 4 [IU] via SUBCUTANEOUS

## 2024-09-26 MED ORDER — SUGAMMADEX SODIUM 200 MG/2ML IV SOLN
INTRAVENOUS | Status: DC | PRN
  Administered 2024-09-26: 150 mg via INTRAVENOUS

## 2024-09-26 MED ORDER — MEPERIDINE HCL 25 MG/ML IJ SOLN: 6.2500 mg | INTRAMUSCULAR | Status: DC | PRN

## 2024-09-26 MED ORDER — OXYMETAZOLINE HCL 0.05 % NA SOLN
NASAL | Status: AC
  Filled 2024-09-26: qty 30

## 2024-09-26 MED ORDER — OXYMETAZOLINE HCL 0.05 % NA SOLN
NASAL | Status: DC | PRN
  Administered 2024-09-26: 1 via TOPICAL

## 2024-09-26 MED ORDER — AMLODIPINE BESYLATE 10 MG PO TABS
10.0000 mg | ORAL_TABLET | Freq: Every morning | ORAL | Status: DC
  Administered 2024-09-27: 10 mg via ORAL
  Filled 2024-09-26 (×2): qty 1

## 2024-09-26 MED ORDER — IRBESARTAN 300 MG PO TABS
300.0000 mg | ORAL_TABLET | Freq: Every day | ORAL | Status: DC
  Administered 2024-09-27: 300 mg via ORAL
  Filled 2024-09-26: qty 1

## 2024-09-26 MED ORDER — ENOXAPARIN SODIUM 40 MG/0.4ML IJ SOSY
40.0000 mg | PREFILLED_SYRINGE | INTRAMUSCULAR | Status: DC
  Administered 2024-09-27: 40 mg via SUBCUTANEOUS
  Filled 2024-09-26: qty 0.4

## 2024-09-26 MED ORDER — SODIUM CHLORIDE 0.9 % IV SOLN: 0.1000 ug/kg/min | INTRAVENOUS | Status: DC

## 2024-09-26 SURGICAL SUPPLY — 73 items
ATTRACTOMAT 16X20 MAGNETIC DRP (DRAPES) IMPLANT
BLADE SURG 15 STRL LF DISP TIS (BLADE) ×4 IMPLANT
CANISTER SUCTION 3000ML PPV (SUCTIONS) ×4 IMPLANT
CATH ROBINSON RED A/P 10FR (CATHETERS) IMPLANT
CLEANER TIP ELECTROSURG 2X2 (MISCELLANEOUS) ×4 IMPLANT
CLIP TI MEDIUM 24 (CLIP) ×4 IMPLANT
CLIP TI WIDE RED SMALL 24 (CLIP) ×4 IMPLANT
CNTNR URN SCR LID CUP LEK RST (MISCELLANEOUS) IMPLANT
COAGULATOR SUCT SWTCH 10FR 6 (ELECTROSURGICAL) ×4 IMPLANT
CORD BIPOLAR FORCEPS 12FT (ELECTRODE) ×4 IMPLANT
COVER BACK TABLE 60X90IN (DRAPES) ×4 IMPLANT
COVER MAYO STAND STRL (DRAPES) ×4 IMPLANT
COVER SURGICAL LIGHT HANDLE (MISCELLANEOUS) ×4 IMPLANT
DRAIN CHANNEL 19F RND (DRAIN) IMPLANT
DRAIN JP 10F RND RADIO (DRAIN) IMPLANT
DRAIN JP 15F RND RADIO PRF (DRAIN) IMPLANT
DRAIN PENROSE 0.25X18 (DRAIN) IMPLANT
DRAPE HALF SHEET 40X57 (DRAPES) ×4 IMPLANT
DRAPE INCISE IOBAN 66X45 STRL (DRAPES) ×4 IMPLANT
DRAPE UTILITY XL STRL (DRAPES) IMPLANT
DRSG TEGADERM 4X4.75 (GAUZE/BANDAGES/DRESSINGS) IMPLANT
DRSG TELFA 3X8 NADH STRL (GAUZE/BANDAGES/DRESSINGS) ×4 IMPLANT
ELECT COATED BLADE 2.86 ST (ELECTRODE) ×4 IMPLANT
ELECTRODE REM PT RTRN 9FT ADLT (ELECTROSURGICAL) ×4 IMPLANT
EVACUATOR SILICONE 100CC (DRAIN) IMPLANT
GAUZE 4X4 16PLY ~~LOC~~+RFID DBL (SPONGE) ×4 IMPLANT
GLOVE BIO SURGEON STRL SZ 6.5 (GLOVE) ×4 IMPLANT
GOWN STRL REUS W/ TWL LRG LVL3 (GOWN DISPOSABLE) ×8 IMPLANT
GUARD TEETH (MISCELLANEOUS) ×4 IMPLANT
HEMOSTAT SNOW SURGICEL 2X4 (HEMOSTASIS) IMPLANT
KIT BASIN OR (CUSTOM PROCEDURE TRAY) ×4 IMPLANT
KIT TURNOVER KIT B (KITS) ×4 IMPLANT
LOCATOR NERVE 3 VOLT (DISPOSABLE) IMPLANT
MARKER SKIN DUAL TIP RULER LAB (MISCELLANEOUS) ×4 IMPLANT
NDL HYPO 25GX1X1/2 BEV (NEEDLE) ×4 IMPLANT
NEEDLE HYPO 25GX1X1/2 BEV (NEEDLE) ×3 IMPLANT
PACK SRG BSC III STRL LF ECLPS (CUSTOM PROCEDURE TRAY) ×4 IMPLANT
PAD ARMBOARD POSITIONER FOAM (MISCELLANEOUS) ×8 IMPLANT
PATTIES SURGICAL .5X1.5 (GAUZE/BANDAGES/DRESSINGS) ×4 IMPLANT
PENCIL BUTTON HOLSTER BLD 10FT (ELECTRODE) IMPLANT
PENCIL SMOKE EVACUATOR (MISCELLANEOUS) ×4 IMPLANT
POSITIONER HEAD DONUT 9IN (MISCELLANEOUS) ×4 IMPLANT
RETRACTOR STAY HOOK 5MM (MISCELLANEOUS) ×4 IMPLANT
SET WALTER ACTIVATION W/DRAPE (SET/KITS/TRAYS/PACK) IMPLANT
SOL ANTI FOG 6CC (MISCELLANEOUS) IMPLANT
SOL PREP POV-IOD 4OZ 10% (MISCELLANEOUS) IMPLANT
SOLN 0.9% NACL 1000 ML (IV SOLUTION) ×3 IMPLANT
SOLN 0.9% NACL POUR BTL 1000ML (IV SOLUTION) ×4 IMPLANT
SOLN STERILE WATER 1000 ML (IV SOLUTION) ×3 IMPLANT
SOLN STERILE WATER BTL 1000 ML (IV SOLUTION) ×4 IMPLANT
SPONGE INTESTINAL PEANUT (DISPOSABLE) ×4 IMPLANT
SPONGE TONSIL 1.25 RF SGL STRG (GAUZE/BANDAGES/DRESSINGS) ×4 IMPLANT
STAPLER SKIN PROX 35W (STAPLE) IMPLANT
SURGILUBE 2OZ TUBE FLIPTOP (MISCELLANEOUS) IMPLANT
SUT ETHILON 3 0 PS 1 (SUTURE) IMPLANT
SUT ETHILON 5 0 PS 2 18 (SUTURE) IMPLANT
SUT MNCRL AB 4-0 PS2 18 (SUTURE) IMPLANT
SUT SILK 2 0 REEL (SUTURE) ×4 IMPLANT
SUT SILK 2 0 SH (SUTURE) IMPLANT
SUT SILK 2 0 SH CR/8 (SUTURE) ×4 IMPLANT
SUT SILK 2-0 18XBRD TIE 12 (SUTURE) IMPLANT
SUT SILK 3 0 REEL (SUTURE) IMPLANT
SUT VIC AB 3-0 SH 8-18 (SUTURE) IMPLANT
SUT VIC AB 4-0 PS2 18 (SUTURE) IMPLANT
SUT VIC AB 4-0 RB1 18 (SUTURE) IMPLANT
SUT VIC AB 5-0 PS2 18 (SUTURE) IMPLANT
SYR BULB EAR ULCER 3OZ GRN STR (SYRINGE) ×4 IMPLANT
TOWEL GREEN STERILE FF (TOWEL DISPOSABLE) ×8 IMPLANT
TRAY ENT MC OR (CUSTOM PROCEDURE TRAY) ×4 IMPLANT
TRAY FOLEY MTR SLVR 14FR STAT (SET/KITS/TRAYS/PACK) IMPLANT
TUBE CONNECTING 12X1/4 (SUCTIONS) ×4 IMPLANT
TUBE SALEM SUMP 16F (TUBING) ×4 IMPLANT
YANKAUER SUCT BULB TIP NO VENT (SUCTIONS) ×4 IMPLANT

## 2024-09-26 NOTE — Anesthesia Procedure Notes (Signed)
 Procedure Name: Intubation Date/Time: 09/26/2024 3:46 PM  Performed by: Julien Manus, CRNAPre-anesthesia Checklist: Patient identified, Emergency Drugs available, Suction available and Patient being monitored Patient Re-evaluated:Patient Re-evaluated prior to induction Oxygen Delivery Method: Circle System Utilized Preoxygenation: Pre-oxygenation with 100% oxygen Induction Type: IV induction Ventilation: Mask ventilation without difficulty Laryngoscope Size: Glidescope and 3 Grade View: Grade I Tube type: Oral Tube size: 6.0 mm Number of attempts: 1 Airway Equipment and Method: Stylet and Oral airway Placement Confirmation: ETT inserted through vocal cords under direct vision, positive ETCO2 and breath sounds checked- equal and bilateral Secured at: 22 cm Tube secured with: Tape Dental Injury: Teeth and Oropharynx as per pre-operative assessment

## 2024-09-26 NOTE — Brief Op Note (Signed)
 ENT Brief op note: Full note to follow but right neck dissection level 1B-4 done and bilateral tonsillectomy. Will admit for overnight observation. 1 drain in place. Remijio Holleran B Salmaan Patchin

## 2024-09-26 NOTE — H&P (Signed)
 Pre-Operative H&P - Day Of Surgery Patient Name: Diane Mayo Date:   09/26/2024  HPI: Diane Mayo is a 73 y.o. female who presents today for operative treatment of squamous cell carcinoma of unknown primary of head and neck. Patient denies recent significant changes to health or significant new medications or physiologic change in condition which would immediately impact plans. No new types of therapy has been initiated that would change the plan or the appropriateness of the plan.   ROS:  A complete review of systems was obtained and is otherwise negative.   PMH:  Past Medical History:  Diagnosis Date   Anemia    Diabetes mellitus without complication (HCC)    Type 2   Family history of adverse reaction to anesthesia    mother slow to wake up   Genital herpes    GERD (gastroesophageal reflux disease)    Hypertension    Interstitial cystitis    Left bundle branch block (LBBB)    chronic   Leukocytosis    NASH (nonalcoholic steatohepatitis)    Osteoarthritis    Plantar fasciitis     PSH:  Past Surgical History:  Procedure Laterality Date   BACK SURGERY     L3 L4   CARPAL TUNNEL RELEASE Bilateral    CESAREAN SECTION     twice 1978 and 1987   CHOLECYSTECTOMY     DILATION AND CURETTAGE OF UTERUS  1975   EYE SURGERY Bilateral 2021   cataracts removed   SHOULDER SURGERY Right 2012   rotator cuff repair   TUBAL LIGATION  1987    MEDS:   Current Facility-Administered Medications:    insulin  aspart (novoLOG ) injection 0-7 Units, 0-7 Units, Subcutaneous, Q2H PRN, Tilford Franky BIRCH, MD   lactated ringers  infusion, , Intravenous, Continuous, Hollis, Kevin D, MD, Last Rate: 10 mL/hr at 09/26/24 1232, New Bag at 09/26/24 1232   scopolamine (TRANSDERM-SCOP) 1 MG/3DAYS 1 mg, 1 patch, Transdermal, Q72H, Hollis, Kevin D, MD, 1 mg at 09/26/24 1238  ALLERGIES: Penicillins, Accupril [quinapril hcl], Januvia [sitagliptin], Monistat [miconazole], Tioconazole, and Zestril  [lisinopril]  EXAM: Vitals: BP (!) 145/53   Pulse 83   Temp 99.2 F (37.3 C) (Oral)   Resp 20   Ht 5' (1.524 m)   Wt 93.4 kg   SpO2 98%   BMI 40.23 kg/m   General Awake, at baseline alertness.   HEENT No scleral icterus or conjunctival hemorrhage. Globe position appears normal. External ears  normal. Nose patent without rhinorrhea. No thyromegaly. Right neck level 2 LN still appears to be mobile  Cardiovascular No cyanosis.  Pulmonary No audible stridor. Breathing easily with no labor.  Neuro Symmetric facial movement.   Psychiatry Appropriate affect and mood.  Skin No scars or lesions on face or neck.  Extermities Moves all extremities with normal range of motion.   Other Findings None.   Assessment & Plan: Diane Mayo has diagnoses of squamous cell carcinoma of unknown primary of head and neck.  and will go to the OR today for direct laryngoscopy with biopsy, possible bilateral tonsillectomy, right modified radical neck dissection. Informed consent was obtained and available in EMR today. All questions have been answered, and risks/benefits/alternatives of procedure as noted in the consent were discussed in a quiet area. Questions were invited and answered. The patient expressed understanding, provided consent and wished to proceed despite risks.  We discussed possibility of recurrence, need for further treatment, need for chemotherapy depending on pathology results, possibility of leaving tumor behind or  positive margins. She understands this and despite these risks still wishes to proceed  We also discussed additional risks and beneifts and alternatives for neck dissection surgery, including pain, bleeding, infection, numbness, risks to major vascular structures and life threatening bleed, tongue or lip weakness, chyle leak, injury to vagus or phrenic nerve, change in speech or swallowing, need for further procedures or treatment. We also discussed post-op management and  expectations.  Discussed return precautions for tonsillectomy including bleeding.     Diane Mayo 09/26/2024 1:20 PM

## 2024-09-26 NOTE — Telephone Encounter (Signed)
 LVM for patient to call back to move her post-op appointment from 10/16 to 10/08 per Dr. Tobie

## 2024-09-26 NOTE — Discharge Instructions (Signed)
 Post Anesthesia Guidelines  Surgery Discharge Instructions:  Call clinic or return to ED if you: - develop a fever greater than 101.4 - have shaking chills or are feeling ill - become short of breath - have uncontrollable nausea or vomiting - can't hold down food or liquids or feel as though you are getting dehydrated - have leakage or drainage from wound - urine output of less than 30cc/hr for 12 hours - develop redness, pain at incision(s) or wound opens up/separates - any other acute events, problems, or concerns  Wound Care/Dressings/Drain Instructions:  - Empty drain as instructed by nurse and see how much comes out - If you neck blows up, please call us  or go to the ED - If you have bleeding from nose or mouth, please call us  or go to the ED  Medications: - Resume your regular home medications except as detailed in the medication reconciliation.  - For pain, take tylenol  1000mg  every 6 hours alternating with Ibuprofen 400mg  every 6 hours. If that is not sufficient, a stronger pain medication has been prescribed to you (oxycodone  5mg  tablet every 4 hours as needed). Do not mix with any other narcotic medication. - Do not take any other NSAIDs like aleve or meloxicam  or ibuprofen - Put bacitracin ointment to incision twice daily for 7 days - Do not get neck wet until you see Dr. Tobie -- shower neck down  Follow Up:  - A follow up appointment should be scheduled for you after discharge to see Dr. Tobie on Wednesday. Call us  Monday if you do not receive exact date/time  Activity/Restrictions:  - Resume your regular activities, as tolerated. Avoid heavy lifting or straining (more than 5 lbs) for 10 days.  Diet: See below  Additional Instructions: - Please take an over the counter stool softener while taking narcotic pain medication - DO NOT MIX NARCOTIC PAIN MEDICATIONS OR TAKE NARCOTIC PRESCRIPTIONS AT THE SAME TIME - DO NOT DRIVE OR OPERATE HEAVY MACHINERY WHILE ON  NARCOTICS  - DO NOT TAKE MORE THAN 4 GRAMS (4000mg ) OF TYLENOL  (ACETAMINOPHEN ) IN 24 HOURS   Tonsillectomy Discharge Instructions (Dr. Tobie) ENT Office Contact Info: Otolaryngology Nursing Triage (Monday-Friday daytime working hours or for emergencies after hours) [phone]  Eating & Drinking Dehydration is the biggest enemy in the recovery period. It will increase the pain, increase the risk of bleeding and delay the healing. It usually happens because the pain of swallowing keeps the patient from drinking enough liquids. The only drinks to avoid are citrus like orange and grapefruit juices because they will burn the back of the throat. Although drinking is more important, eating is fine even the day of surgery but avoid foods that are crunchy or have sharp edges for 10 days. Dairy products may be taken, if desired. You should avoid acidic, salty and spicy foods (especially tomato sauces). Almost everyone loses some weight after tonsillectomy (which is usually regained in the 2nd or 3rd week after surgery).  Drinking is far more important that eating in the first 14 days after surgery, so concentrate on that first and foremost. Adequate liquid intake probably speeds recovery.  Other things.  The recovery from tonsillectomy is a very painful period, often the worst pain people can recall, so please be understanding and patient with yourself, or the patient you are caring for. It is helpful to take pain medicine during the night if the patient awakens-- the worst pain is usually in the morning. The pain may seem to  increase 2-5 days after surgery -this is normal when inflammation sets in. Please be aware that no combination of medicines will eliminate the pain - the patient will need to continue eating/drinking in spite of the remaining discomfort.  What should we expect after surgery? As previously mentioned, most patients have a significant amount of pain after tonsillectomy, with pain resolving  7-14 days after surgery. Older children and adults seem to have more discomfort. Most patients can go home the day of surgery.  Ear pain: Many people will complain of earaches after tonsillectomy. This is normal and should resolve. Give pain medications and encourage liquid intake.  Fever: Many patients have a low-grade fever after tonsillectomy - up to 101.5 degrees (380 C.) for several days. Higher prolonged fever should be reported to your surgeon.  Bad looking (and bad smelling) throat: After surgery, the place where the tonsils were removed is covered with a white film, which is a moist scab. This usually develops 3-5 days after surgery and falls off 10-14 days after surgery and usually causes bad breath. There will be some redness and swelling as well. The uvula (the part of the throat that hangs down in the middle between the tonsils) is usually swollen for several days after surgery.  Sore/bruised feeling of Tongue: This is common for the first few days after surgery because the tongue is pushed out of the way to take out the tonsils in surgery.  When should we call the doctor?  Nausea/Vomiting: This is a common side effect from General Anesthesia and can last up to 24-36 hours after surgery. Try giving sips of clear liquids like Sprite, water or apple juice then gradually increase fluid intake. If the nausea or vomiting continues beyond this time frame, call the doctor's office for medications that will help relieve the nausea and vomiting.  Bleeding: Significant bleeding is rare, but it happens to about 5% of patients who have tonsillectomy. It may come from the nose, the mouth, or be vomited or coughed up. Ice water mouthwashes may help stop or reduce bleeding. Please call our office or go to the ED for any bleeding from nose or mouth.  Dehydration: If there has been little or no liquids intake for 24 hours, the patient may need to come to the hospital for IV fluids. Signs of dehydration include  lethargy, the lack of tears when crying, and reduced or very concentrated urine output.  High Fever: If the patient has a consistent temperatures greater than 102, or when accompanied by cough or difficulty breathing, you should call the doctor's office.

## 2024-09-26 NOTE — Transfer of Care (Signed)
 Immediate Anesthesia Transfer of Care Note  Patient: Diane Mayo  Procedure(s) Performed: LARYNGOSCOPY, DIRECT TONSILLECTOMY (Bilateral) DISSECTION, NECK, RADICAL (Right)  Patient Location: PACU  Anesthesia Type:General  Level of Consciousness: awake, alert , and oriented  Airway & Oxygen Therapy: Patient Spontanous Breathing and Patient connected to face mask oxygen  Post-op Assessment: Report given to RN, Post -op Vital signs reviewed and stable, and Patient moving all extremities  Post vital signs: Reviewed and stable  Last Vitals:  Vitals Value Taken Time  BP 148/91 09/26/24 20:32  Temp    Pulse 86 09/26/24 20:37  Resp 14 09/26/24 20:37  SpO2 97 % 09/26/24 20:37  Vitals shown include unfiled device data.  Last Pain:  Vitals:   09/26/24 1218  TempSrc:   PainSc: 0-No pain         Complications: No notable events documented.

## 2024-09-27 DIAGNOSIS — Z9889 Other specified postprocedural states: Secondary | ICD-10-CM

## 2024-09-27 DIAGNOSIS — C4442 Squamous cell carcinoma of skin of scalp and neck: Secondary | ICD-10-CM | POA: Diagnosis not present

## 2024-09-27 LAB — GLUCOSE, CAPILLARY
Glucose-Capillary: 163 mg/dL — ABNORMAL HIGH (ref 70–99)
Glucose-Capillary: 169 mg/dL — ABNORMAL HIGH (ref 70–99)
Glucose-Capillary: 260 mg/dL — ABNORMAL HIGH (ref 70–99)

## 2024-09-27 NOTE — Care Management CC44 (Signed)
 Condition Code 44 Documentation Completed  Patient Details  Name: Diane Mayo MRN: [id number] Date of Birth: 1951-12-23   Condition Code 44 given:  Yes Patient signature on Condition Code 44 notice:  Yes Documentation of 2 MD's agreement:  Yes Code 44 added to claim:  Yes    Corean JAYSON Canary, RN 09/27/2024, 11:07 AM

## 2024-09-27 NOTE — Plan of Care (Signed)

## 2024-09-27 NOTE — Care Management Obs Status (Signed)
 MEDICARE OBSERVATION STATUS NOTIFICATION   Patient Details  Name: Diane Mayo MRN: [id number] Date of Birth: 06-20-1951   Medicare Observation Status Notification Given:  Yes    Corean JAYSON Canary, RN 09/27/2024, 11:07 AM

## 2024-09-27 NOTE — Care Plan (Signed)
 AVS instructed and understood with questions asked and answered. Daughter drained the JP drain.

## 2024-09-27 NOTE — Plan of Care (Signed)
 Patient and family has been taught on how to do the drain care. Patient's daughter was able to demonstrate how to apply ointment on the incision site. Family verbalized understanding.  Problem: Education: Goal: Knowledge of General Education information will improve Description: Including pain rating scale, medication(s)/side effects and non-pharmacologic comfort measures Outcome: Progressing   Problem: Health Behavior/Discharge Planning: Goal: Ability to manage health-related needs will improve Outcome: Progressing   Problem: Clinical Measurements: Goal: Ability to maintain clinical measurements within normal limits will improve Outcome: Progressing Goal: Will remain free from infection Outcome: Progressing Goal: Diagnostic test results will improve Outcome: Progressing Goal: Respiratory complications will improve Outcome: Progressing Goal: Cardiovascular complication will be avoided Outcome: Progressing   Problem: Activity: Goal: Risk for activity intolerance will decrease Outcome: Progressing   Problem: Nutrition: Goal: Adequate nutrition will be maintained Outcome: Progressing   Problem: Coping: Goal: Level of anxiety will decrease Outcome: Progressing   Problem: Elimination: Goal: Will not experience complications related to bowel motility Outcome: Progressing Goal: Will not experience complications related to urinary retention Outcome: Progressing   Problem: Pain Managment: Goal: General experience of comfort will improve and/or be controlled Outcome: Progressing   Problem: Safety: Goal: Ability to remain free from injury will improve Outcome: Progressing   Problem: Skin Integrity: Goal: Risk for impaired skin integrity will decrease Outcome: Progressing   Problem: Education: Goal: Ability to describe self-care measures that may prevent or decrease complications (Diabetes Survival Skills Education) will improve Outcome: Progressing Goal: Individualized  Educational Video(s) Outcome: Progressing   Problem: Coping: Goal: Ability to adjust to condition or change in health will improve Outcome: Progressing   Problem: Fluid Volume: Goal: Ability to maintain a balanced intake and output will improve Outcome: Progressing   Problem: Health Behavior/Discharge Planning: Goal: Ability to identify and utilize available resources and services will improve Outcome: Progressing Goal: Ability to manage health-related needs will improve Outcome: Progressing   Problem: Metabolic: Goal: Ability to maintain appropriate glucose levels will improve Outcome: Progressing   Problem: Nutritional: Goal: Maintenance of adequate nutrition will improve Outcome: Progressing Goal: Progress toward achieving an optimal weight will improve Outcome: Progressing   Problem: Skin Integrity: Goal: Risk for impaired skin integrity will decrease Outcome: Progressing   Problem: Tissue Perfusion: Goal: Adequacy of tissue perfusion will improve Outcome: Progressing   Problem: Education: Goal: Knowledge of the prescribed therapeutic regimen will improve Outcome: Progressing   Problem: Activity: Goal: Ability to tolerate increased activity will improve Outcome: Progressing   Problem: Health Behavior/Discharge Planning: Goal: Identification of resources available to assist in meeting health care needs will improve Outcome: Progressing   Problem: Nutrition: Goal: Maintenance of adequate nutrition will improve Outcome: Progressing   Problem: Clinical Measurements: Goal: Complications related to the disease process, condition or treatment will be avoided or minimized Outcome: Progressing   Problem: Respiratory: Goal: Will regain and/or maintain adequate ventilation Outcome: Progressing   Problem: Skin Integrity: Goal: Demonstration of wound healing without infection will improve Outcome: Progressing

## 2024-09-28 NOTE — Op Note (Addendum)
 Otolaryngology Operative note  Diane Mayo Date/Time of Admission: 09/26/2024 11:36 AM  CSN: [id number];MRN:[id number]  DOB: 1951/08/06 Age: 73 y.o. 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  Post-Op Diagnosis: Same  Procedure: Right Modified Radical Neck Dissection (CPT 61275-[id number]) Bilateral Tonsillectomy Age > 12 (CPT [phone]) Direct Laryngoscopy with Biopsy with Use of Operating Telescope (CPT [phone])  Surgeon: Eldora Blanch, MD  Anesthesia type:  General  Anesthesiologist: Anesthesiologist: Maryclare Cornet, MD; Mallory Manus, MD CRNA: Eudora Pearla DASEN, CRNA; Julien Manus, CRNA   Staff: Circulator: Bari Birmingham, RN Relief Circulator: Gwenn Mardy SQUIBB, RN; Henry Lauraine PARAS, RN Scrub Person: Mercer Lilyan FALCON, CST Circulator Assistant: Welford Stevenson NOVAK, RN; Sherrine Moats, RN  Specimens: ID Type Source Tests Collected by Time Destination  1 : right lingual tonsil Tissue PATH ENT excision SURGICAL PATHOLOGY Blanch Eldora NOVAK, MD 09/26/2024 1611   2 : left lingual tonsil Tissue PATH ENT excision SURGICAL PATHOLOGY Blanch Eldora NOVAK, MD 09/26/2024 1612   3 : right tonsil Tissue PATH ENT excision SURGICAL PATHOLOGY Blanch Eldora NOVAK, MD 09/26/2024 1622   4 : left tonsil Tissue PATH ENT excision SURGICAL PATHOLOGY Blanch Eldora NOVAK, MD 09/26/2024 1626   5 : Level 2A, 3 & 4 Tissue PATH ENT excision SURGICAL PATHOLOGY Blanch Eldora NOVAK, MD 09/26/2024 1845   6 : Level 2B Tissue PATH ENT excision SURGICAL PATHOLOGY Blanch Eldora NOVAK, MD 09/26/2024 1856   7 : Submandibular content Tissue PATH ENT excision SURGICAL PATHOLOGY Blanch Eldora NOVAK, MD 09/26/2024 1918     EBL:  250 mL  Drains: 19 Fr suction Drain  Post-op disposition and condition: PACU, hemodynamically stable  Findings: Large Right Level 2 Lymph node prior biopsied as carcinoma. No evidence of gross extracapsular extension; right neck dissection with other small benign  appearing lymph nodes. Given [facility] and how high the lymph node was, Level 1B neck dissection also performed  Bilateral tonsils without obvious firmness concerning for carcinoma - on frozen section, right tonsil was negative for carcinoma Bilateral lingual tonsils biopsied, slightly firm, but no other concern for carcinoma  Complications: None apparent  Indications and consent:  Diane Mayo is a 73 y.o. female with diagnoses above. The patient's options were discussed, including risks/benefits/alternatives for each option. Patient expressed understanding, and despite these risks, consented and decided to proceed with above procedures. Informed consent was signed before proceeding.  Procedure: After being properly identified in the preoperative holding area, the patient was brought into the operating suite. Patient was placed supine on the operating table. A pre-procedural time-out was performed. After induction of general endotracheal anesthesia and establishment of airway, a surgeon initiated time out was performed.  The bed was rotated 90 degrees. The patient was then padded and draped appropriately.     The oral cavity was first examined and no abnormalities were identified. We then proceeded with our direct laryngoscopy. A maxillary tooth guard was placed to protect the maxillary gingiva. The Dedo laryngoscope was inserted into the oral cavity and advanced distally. Examination of the oral cavity, oropharynx, hypopharynx and larynx was performed in a sequential manner at that time. It revealed findings as above. The operating telescope (0 degree) was used to examine the areas described above. Next, the instruments were removed and a right tonsillectomy was performed. The mouth was held open in suspension using a Crowe-Davis mouth gag suspended on a Mayo stand. There was no evidence of submucosal cleft palate. The  right tonsil was grasped using an Allis and removed in a capsular plane using  the protected spatula tip Bovie (15 cut, 15 coag). The tonsil was sent for frozen section with findings above. The left tonsil was removed in a similar fashion. Small areas of bleeding and visible blood vessels were coagulated to achieve hemostasis. The Crowe-Davis was released and the patient was resuspended and the tonsillar fossae were rechecked for hemostasis which was adequate.. Next, the Dedo laryngoscope was used to take biopsies of the lingual tonsil. The operative field was then irrigated and meticulously checked for hemostasis. Afrin pledgets were used for hemostasis.  The hypopharynx and oropharynx were suctioned. All instruments were removed.  The patient was prepped and draped in the usual fashion for neck dissection after being prepped with betadine. A marking pen was used to mark the incision line in the direction of relaxed skin tension lines over a neck crease in the right neck. A sharp knife was used to incise through the skin and dermis. Superior and inferior subplatysmal flaps were raised.   We first began with a level 1B neck dissection. The marginal mandibular nerve was first identified at the mandibular notch and traced medially. Next, the submandibular fascia was elevated off the gland and the mass to protect the marginal mandibular nerve. The gland was skeletonized in an inferior to superior direction. The mylohyoid muscle was identified anterior to the gland and the gland was dissected free from the muscle. The facial vein was identified immediately posterior to the gland and was ligated. Next, the submandibular ganglion was located coming off of the lingual nerve. The ganglion and duct were then divided and ligated. The gland was passed off the field as a specimen. Next, the perifacial fat and nodes from the inferior border of the mandible were dissected from superior to inferior while preserving marginal mandibular nerve down to the prior submandibular gland. Facial artery was  identified and ligated. This freed the submandibular contents, and the specimen was then passed off the field.   The posterior belly of the digastric muscle was then identified and followed posteriorly towards the mastoid. The anterior border of the sternocleidomastoid was then skeletonized. The spinal accessory nerve was then identified in its usual location and dissected superiorly. Proceeding in an inferior direction, the anterior border of the sternocleidomastoid was further skeletonized to the level of the deep cervical fascia and cervical rootlets found. The omohyoid was encountered and divided. This marked the edges of our neck dissection borders, and the lymphatic contents of levels 2a, 3, and 4 were then grasped and pulled anteriorly. These contents were then carefully dissected free from the internal jugular vein, and carotid sheath. Working posteriorly, the nodes were freed up off the deep cervical fascia, just superficial to the deep cervical rootlets. The nodal packet was then lifted off the carotid sheath and the level 2A and 3, and 4 contents were removed, which included the large mass. No evidence of gross extranodal extension was noted. The internal jugular vein, spinal accessory nerve, carotid artery and vagus nerve were preserved. The nodal packet was then passed of the field. Hemostasis was achieved with bipolar cautery. Next, we addressed level 2b. The spinal accessory nerve was located and the lateral edge dissected and freed from the 2b lymph nodes. The nodal packet was lifted off first from edge of the sternocleidomastoid, and then from the deep neck. The nodal contents were then dissected off passed under the spinal accessory nerve after being freed from  it, and then freed and passed off the field. Hemostasis was achieved with bipolar cautery.  The neck compartments were then irrigated, suctioned, and valsalva performed. Hemostasis was adequate. Surgicel SNoW was placed in the wound bed.  A 19-Fr Blake drain were placed in the neck and brought out through the skin, securing it with silk suture. The neck was then closed in layers, using 3-0 Vicryl for the platysma, 4-0 Vicryl sutures for the dermis and staples for the skin.    All instruments were removed, skin cleansed and bacitracin ointment applied. This concluded the procedure. Counts were correct at end of procedure. Care was then turned over to anesthesia team who extubated the patient and transported to PACU in stable condition

## 2024-09-28 NOTE — Anesthesia Postprocedure Evaluation (Signed)
 Anesthesia Post Note  Patient: ANNISSA ANDREONI  Procedure(s) Performed: LARYNGOSCOPY, DIRECT TONSILLECTOMY (Bilateral) DISSECTION, NECK, RADICAL (Right)     Patient location during evaluation: PACU Anesthesia Type: General Level of consciousness: awake and alert Pain management: pain level controlled Vital Signs Assessment: post-procedure vital signs reviewed and stable Respiratory status: spontaneous breathing, nonlabored ventilation, respiratory function stable and patient connected to nasal cannula oxygen Cardiovascular status: blood pressure returned to baseline and stable Postop Assessment: no apparent nausea or vomiting Anesthetic complications: no   No notable events documented.  Last Vitals:  Vitals:   09/27/24 0915 09/27/24 1200  BP: (!) 121/57 (!) 113/56  Pulse: 66 64  Resp:  18  Temp:  36.5 C  SpO2:  99%    Last Pain:  Vitals:   09/27/24 1210  TempSrc:   PainSc: 2                  Daniel Ritthaler S

## 2024-09-29 ENCOUNTER — Encounter (HOSPITAL_COMMUNITY): Payer: Self-pay | Admitting: Otolaryngology

## 2024-09-29 NOTE — Discharge Summary (Signed)
 Discharge Summary  Date of Admission: 09/26/2024  Date of Discharge: 09/27/2024  Diagnoses on Admission Order:  Secondary squamous cell carcinoma of head and neck [facility] Principal Problem (Discharge Diagnoses):  Secondary squamous cell carcinoma of head and neck (HCC)  Procedures Performed:  Bilateral tonsillectomy with right neck dissection levels 1B-4  Hospital Course:  This is a 73 y.o. female who was seen by Dr. Tobie for [facility] of the neck with unknown primary. The patient arrive to the hospital on 09/26/2024 for bilateral tonsillectomy with right neck dissection. Benefits and risks of procedure were discussed, including bleeding, infection, injury to nearby structures, pain, scar, and need for further procedures; all questions sought and answered, and written consent was obtained.The operation was performed without complication and the patient had an uneventful recovery. Postoperatively patient's diet was gradually advanced as tolerated. Patient's pain remained well-controlled.  Please see the medical chart for further details regarding patient's hospitalization.  Evaluation on Day of Discharge:  The patient was seen and examined on the day of discharge and noted to have good oral intake without nausea or emesis, voiding without difficulty, and ambulating at baseline. Incision is clean dry and intact without any neck swelling or signs of hematoma. Drain is in place with sanguinous output. The patient's pain is well controlled.The patient denies any fever, chills, shortness of breath, difficulty breathing, chest pain or palpitations. The patient is ready to be discharged and is expected to do well in their post-operative recovery. All questions and concerns were address and answered prior to discharge.The patient has been hemodynamically stable and appears appropriate for discharge. They have been instructed to follow up in the office with the surgeon as instructed in their  discharge packet. Patient was agreeable to discharge home. Patient understands instructions for surgical incision care and for follow-up in the office.   Adah Malkin, DO [facility]

## 2024-10-01 ENCOUNTER — Ambulatory Visit (INDEPENDENT_AMBULATORY_CARE_PROVIDER_SITE_OTHER): Admitting: Otolaryngology

## 2024-10-01 ENCOUNTER — Encounter (INDEPENDENT_AMBULATORY_CARE_PROVIDER_SITE_OTHER): Payer: Self-pay | Admitting: Otolaryngology

## 2024-10-01 VITALS — BP 141/75 | HR 76 | Ht 60.0 in | Wt 206.0 lb

## 2024-10-01 DIAGNOSIS — C4492 Squamous cell carcinoma of skin, unspecified: Secondary | ICD-10-CM

## 2024-10-01 DIAGNOSIS — Z9889 Other specified postprocedural states: Secondary | ICD-10-CM

## 2024-10-01 MED ORDER — OXYCODONE HCL 5 MG PO TABS: 5.0000 mg | ORAL_TABLET | ORAL | 0 refills | Status: AC | PRN

## 2024-10-01 NOTE — Progress Notes (Signed)
 S/p b/l Tonsillectomy, lingual tonsil bx and R ND 09/26/2024 S: Doing well, pain as expected; drinking some; no bleeding from mouth or nose; no fevers or issue with wound. No tongue weakness, no lip weakness. No SOB. Drain with light SS output. O: CN II-XII intact; neck c/d/I, drain in place with output <30cc over last 12 hours; no evidence of infection; staples in place A/P: 73 y.o. y.o. w/ SCCa of unknown primary now s/p DL/Bx, tonsillectomy and R ND 09/26/2024. Otherwise she is recovering appropriately We discussed her path -- no ENE; HPV pending, which I requested. Do not think she'd need Chemo but can consider RT here.  Continue hydration, contact if bleeding occurs from nose or mouth F/u in 1 week for staple removal

## 2024-10-02 ENCOUNTER — Telehealth (INDEPENDENT_AMBULATORY_CARE_PROVIDER_SITE_OTHER): Payer: Self-pay

## 2024-10-02 LAB — SURGICAL PATHOLOGY

## 2024-10-02 NOTE — Telephone Encounter (Signed)
 Informed patient of providers recommendation. Patient verbalizes understanding.

## 2024-10-02 NOTE — Telephone Encounter (Signed)
 Pt called and stated that she woke up this morning with a vaginal itch and did not understand why if she is on an antibiotic that patel prescribed her.

## 2024-10-03 ENCOUNTER — Inpatient Hospital Stay: Attending: Oncology | Admitting: Oncology

## 2024-10-03 ENCOUNTER — Inpatient Hospital Stay

## 2024-10-03 VITALS — BP 142/60 | HR 80 | Temp 97.8°F | Resp 15 | Ht 60.0 in | Wt 208.4 lb

## 2024-10-03 DIAGNOSIS — Z87891 Personal history of nicotine dependence: Secondary | ICD-10-CM | POA: Insufficient documentation

## 2024-10-03 DIAGNOSIS — Z79620 Long term (current) use of immunosuppressive biologic: Secondary | ICD-10-CM | POA: Diagnosis not present

## 2024-10-03 DIAGNOSIS — C7989 Secondary malignant neoplasm of other specified sites: Secondary | ICD-10-CM | POA: Diagnosis not present

## 2024-10-03 DIAGNOSIS — C801 Malignant (primary) neoplasm, unspecified: Secondary | ICD-10-CM | POA: Insufficient documentation

## 2024-10-03 DIAGNOSIS — Z808 Family history of malignant neoplasm of other organs or systems: Secondary | ICD-10-CM | POA: Insufficient documentation

## 2024-10-03 LAB — CBC WITH DIFFERENTIAL (CANCER CENTER ONLY)
Abs Immature Granulocytes: 0.05 K/uL (ref 0.00–0.07)
Basophils Absolute: 0.1 K/uL (ref 0.0–0.1)
Basophils Relative: 1 %
Eosinophils Absolute: 0.3 K/uL (ref 0.0–0.5)
Eosinophils Relative: 3 %
HCT: 32.6 % — ABNORMAL LOW (ref 36.0–46.0)
Hemoglobin: 10.6 g/dL — ABNORMAL LOW (ref 12.0–15.0)
Immature Granulocytes: 1 %
Lymphocytes Relative: 22 %
Lymphs Abs: 2.2 K/uL (ref 0.7–4.0)
MCH: 26.8 pg (ref 26.0–34.0)
MCHC: 32.5 g/dL (ref 30.0–36.0)
MCV: 82.5 fL (ref 80.0–100.0)
Monocytes Absolute: 1 K/uL (ref 0.1–1.0)
Monocytes Relative: 10 %
Neutro Abs: 6.3 K/uL (ref 1.7–7.7)
Neutrophils Relative %: 63 %
Platelet Count: 290 K/uL (ref 150–400)
RBC: 3.95 MIL/uL (ref 3.87–5.11)
RDW: 15.4 % (ref 11.5–15.5)
WBC Count: 9.9 K/uL (ref 4.0–10.5)
nRBC: 0 % (ref 0.0–0.2)

## 2024-10-03 LAB — CMP (CANCER CENTER ONLY)
ALT: 28 U/L (ref 0–44)
AST: 32 U/L (ref 15–41)
Albumin: 4.2 g/dL (ref 3.5–5.0)
Alkaline Phosphatase: 58 U/L (ref 38–126)
Anion gap: 9 (ref 5–15)
BUN: 13 mg/dL (ref 8–23)
CO2: 28 mmol/L (ref 22–32)
Calcium: 9.8 mg/dL (ref 8.9–10.3)
Chloride: 101 mmol/L (ref 98–111)
Creatinine: 1.02 mg/dL — ABNORMAL HIGH (ref 0.44–1.00)
GFR, Estimated: 59 mL/min — ABNORMAL LOW (ref >60)
Glucose, Bld: 138 mg/dL — ABNORMAL HIGH (ref 70–99)
Potassium: 3.6 mmol/L (ref 3.5–5.1)
Sodium: 138 mmol/L (ref 135–145)
Total Bilirubin: 0.7 mg/dL (ref 0.0–1.2)
Total Protein: 7.2 g/dL (ref 6.5–8.1)

## 2024-10-03 LAB — TSH: TSH: 2 u[IU]/mL (ref 0.350–4.500)

## 2024-10-03 NOTE — Progress Notes (Signed)
 Oncology Nurse Navigator Documentation   Met with patient during initial consult with Dr. Autumn. She was accompanied by her husband and friend, Reba. Further introduced myself as her/their Navigator, explained my role as a member of the Care Team. Assisted with post-consult appt scheduling. She is aware that she will receive a call to get scheduled for a radiation planning appointment.   They verbalized understanding of information provided. I encouraged them to call with questions/concerns moving forward.  [name], RN, BSN, OCN Head & Neck Oncology Nurse Navigator [facility] [place] [phone]

## 2024-10-03 NOTE — Progress Notes (Signed)
 [facility]  ONCOLOGY CONSULT NOTE   PATIENT NAME: Diane Mayo   MR#: [id number] DOB: Apr 24, 1951  DATE OF SERVICE: 10/03/2024   REFERRING PROVIDER  Lauraine Golden, MD  Patient Care Team: Sun, Vyvyan, MD as PCP - General (Family Medicine) Michele Richardson, DO as PCP - Cardiology (Cardiology) [name], MD as Consulting Physician (Cardiology) Onetha Kuba, MD as Consulting Physician (Neurosurgery) Malmfelt, Delon CROME, RN as Oncology Nurse Navigator Tobie Eldora NOVAK, MD as Consulting Physician (Otolaryngology) Golden Lauraine, MD as Consulting Physician (Radiation Oncology) Autumn Millman, MD as Consulting Physician (Oncology)    CHIEF COMPLAINT/ PURPOSE OF CONSULTATION:   HPI positive squamous cell carcinoma of the head and neck, unknown primary, 1 out of 16 lymph nodes positive for malignancy.  ASSESSMENT & PLAN:   Diane Mayo is a 73 y.o. 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  Status post bilateral tonsillectomy and modified radical right sided neck dissection on 09/26/2024.  1/16 lymph nodes positive for malignancy.  No evidence of extranodal extension.  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  Staging PET scan on 08/15/24: right-sided neck mass demonstrates low level heterogeneous peripheral FDG accumulation with SUV of 4.3 with no other hypermetabolic lymphadenopathy in the neck. No evidence for hypermetabolic metastatic disease in the chest, abdomen, or pelvis.    During her follow up on 08/14/24, patient opted to proceed with bilateral tonsillectomy and diagnostic laryngoscopy with biopsies.   Her case was previously discussed at our ENT  multidisciplinary tumor board, and the consensus was that her PET demonstrates no evidence of tumor primary and to proceed with bilateral tonsillectomy and diagnostic laryngoscopy with biopsies.  Patient desired to wait until after her granddaughter's wedding and hence this was scheduled for 09/26/24.    On 09/26/2024, she underwent bilateral tonsillectomy and right-sided modified radical neck dissection with removal of 16 lymph nodes, direct laryngoscopy with biopsy.  Pathology showed benign tonsillar tissue bilaterally.  Right level 2A lymph node showed metastatic squamous cell carcinoma, measuring 5.2 x 4 x 3.5 cm.  Negative for extranodal extension.  All other lymph nodes were negative for carcinoma.  Submandibular gland was also benign. 1/16 LNs positive.  Immunostains for p16 showed strong diffuse positivity in the tumor.    cTx,cN2a,cM0.    Though she has N2a disease, no other high risk features noted, including lack of extranodal extension.  I recommended adjuvant radiation alone as per NCCN guidelines, placing chemotherapy in a weaker recommendation category (2B). Her case will be further discussed in multidisciplinary ENT conference.  - Plan for restaging with PET scan three months post-radiation.  Additional appointments with us  will be scheduled as needed based on final plan of care.  Postoperative dysphagia and odynophagia following neck surgery Postoperative dysphagia and odynophagia following neck surgery, with symptoms of difficulty swallowing and pain, particularly with cold substances. Symptoms are expected to improve over the next three to four weeks before radiation therapy begins. - Advise avoiding cold substances if she exacerbates symptoms.  Postoperative pain following neck surgery Postoperative pain following neck surgery, managed with oxycodone  and acetaminophen . Pain level is around 4-5 with medication, but can reach 8-9 without it. - Continue oxycodone  every six hours as  needed. -  Alternate acetaminophen  with ibuprofen to reduce acetaminophen  intake. - Plan to taper oxycodone  as pain improves.  I reviewed lab results and outside records for this visit and discussed relevant results with the patient. Diagnosis, plan of care and treatment options were also discussed in detail with the patient. Opportunity provided to ask questions and answers provided to her apparent satisfaction. Provided instructions to call our clinic with any problems, questions or concerns prior to return visit. I recommended to continue follow-up with PCP and sub-specialists. She verbalized understanding and agreed with the plan. No barriers to learning was detected.  NCCN guidelines have been consulted in the planning of this patient's care.  Chinita Patten, MD  10/03/2024 11:29 AM  [facility] - A DEPT OF [facility] [address] [place] [address] Dept: [phone] Dept Fax: [phone]   HISTORY OF PRESENTING ILLNESS:   I have reviewed her chart and materials related to her cancer extensively and collaborated history with the patient. Summary of oncologic history is as follows:  ONCOLOGY HISTORY:  She presented to her PCP in late January of 2025 with concerns over a mass on her right neck. She underwent a head and neck US  on 03/18/24 showing a 4.5 cm complex cystic mass in or adjacent to the right parotid gland. To further investigate her symptoms she then underwent a neck CT on 04/09/24 showing a right level 2A predominantly cystic lesion measuring up to 3.2 cm in greatest axial dimension.      She was then referred to Dr. Tobie on 07/16/24 for a consult regarding her mass.   On 08/04/2024, she underwent a fine needle aspiration biopsy of right parotid with pathology indicating squamous cell carcinoma. Immunohistochemical stain for p16 was performed. The stain show increased staining in the basal layer of the  squamous epithelium but overall it is patchy. There is no definitive evidence of block like pattern of staining. The finding does not support a HPV related squamous cell carcinoma. The possibility of squamous cell carcinoma arising from a branchial cleft cyst is considered. However, a metastatic squamous cell carcinoma cannot be excluded.  Addendum: High risk HPV subtypes 16/18 detected.   US  abdomen 08/06/24: Cirrhotic liver morphology without focal hepatic lesion.    Staging PET scan on 08/15/24: right-sided neck mass demonstrates low level heterogeneous peripheral FDG accumulation with SUV of 4.3 with no other hypermetabolic lymphadenopathy in the neck. No evidence for hypermetabolic metastatic disease in the chest, abdomen, or pelvis.    During her follow up on 08/14/24, patient opted to proceed with bilateral tonsillectomy and diagnostic laryngoscopy with biopsies.   Her case was previously discussed at our ENT multidisciplinary tumor board, and the consensus was that her PET demonstrates no evidence of tumor primary and to proceed with bilateral tonsillectomy and diagnostic laryngoscopy with biopsies.  Patient desired to wait until after her granddaughter's wedding and hence this was scheduled for 09/26/24.    On 09/26/2024, she underwent bilateral tonsillectomy and right-sided modified radical neck dissection with removal of 16 lymph nodes, direct laryngoscopy with biopsy.  Pathology showed benign tonsillar tissue bilaterally.  Right level 2A lymph node showed metastatic squamous cell carcinoma, measuring 5.2 x 4 x 3.5 cm.  Negative for extranodal extension.  All other lymph nodes were negative for carcinoma.  Submandibular gland was also benign. 1/16 LNs positive.  Immunostains for p16 showed strong diffuse positivity in the tumor.    cTx,cN2a,cM0.  Though she has N2a disease, no other high risk features noted, including lack of extranodal extension.  I recommended adjuvant radiation alone as  per NCCN guidelines.  Her case will be further discussed in multidisciplinary ENT conference.  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  History of Present Illness RAUSHANAH OSMUNDSON is a 73 year old female with metastatic squamous cell carcinoma who presents for post-surgical follow-up and discussion of further treatment options. She is accompanied by her husband and her caregiver, Reba.  She underwent surgery a week ago for metastatic squamous cell carcinoma, during which 16 lymph nodes were removed, and one was found to be cancerous. The lymph node was larger than initially shown on scans, measuring 5.2 cm at final pathology. Days four and five post-surgery were the most challenging, but she is now feeling better, with today being the best day since surgery.  Her prior workup included a CT scan in April, a biopsy in August, and a PET scan on August 22nd. The initial CT scan showed a lymph node measuring 3.2 cm, which increased to 5.2 cm at the time of surgery. The PET scan did not show disease elsewhere. The cancer is HPV-driven, with P16 positivity confirmed after additional testing.  She experiences difficulty swallowing, with pain described as 'a sharp pain' on one side during swallowing. Cold foods exacerbate her symptoms. She has been experiencing nausea and vomiting post-surgery, with one episode occurring three  nights ago, possibly due to taking medication without food. She also reports sleeping in a recliner due to breathing difficulties when lying flat.  Her current medications include Tylenol , taken every six hours, and oxycodone , also taken every six hours. She is also taking ibuprofen as part of her pain management regimen.  Her family history includes other relatives with cancer, and she is currently the caregiver for her brother, Alm, who has his own health issues. She plans to manage her treatment schedule around her caregiving responsibilities.    MEDICAL HISTORY:  Past Medical History:  Diagnosis Date   Anemia    Diabetes mellitus without complication (HCC)    Type 2   Family history of adverse reaction to anesthesia    mother slow to wake up   Genital herpes    GERD (gastroesophageal reflux disease)    Hypertension    Interstitial cystitis    Left bundle branch block (LBBB)    chronic   Leukocytosis    NASH (nonalcoholic steatohepatitis)    Osteoarthritis    Plantar fasciitis     SURGICAL HISTORY: Past Surgical History:  Procedure Laterality Date   BACK SURGERY     L3 L4   CARPAL TUNNEL RELEASE Bilateral    CESAREAN SECTION     twice 1978 and 1987   CHOLECYSTECTOMY     DILATION AND CURETTAGE OF UTERUS  1975   DIRECT LARYNGOSCOPY N/A 09/26/2024   Procedure: LARYNGOSCOPY, DIRECT;  Surgeon: Tobie Eldora NOVAK, MD;  Location: [facility] OR;  Service: ENT;  Laterality: N/A;   EYE SURGERY Bilateral 2021   cataracts removed   RADICAL NECK DISSECTION Right 09/26/2024   Procedure: DISSECTION, NECK, RADICAL;  Surgeon: Tobie Eldora NOVAK, MD;  Location: [facility] OR;  Service: ENT;  Laterality: Right;   SHOULDER SURGERY Right 2012   rotator cuff repair   TONSILLECTOMY Bilateral 09/26/2024   Procedure: TONSILLECTOMY;  Surgeon: Tobie Eldora NOVAK, MD;  Location: [facility] OR;  Service: ENT;  Laterality: Bilateral;   TUBAL LIGATION  1987    SOCIAL HISTORY: She reports that she quit smoking about 38 years  ago. Her smoking use included cigarettes. She started smoking about 39 years ago. She has a 0.5 pack-year smoking history. She has never used smokeless tobacco. She reports that she does not currently use alcohol . She reports that she does not use drugs. Social History   Socioeconomic History   Marital status: Married    Spouse name: Not on file   Number of children: 2   Years of education: Not on file   Highest education level: Not on file  Occupational History   Not on file  Tobacco Use   Smoking status: Former    Current packs/day: 0.00    Average packs/day: 0.5 packs/day for 1 year (0.5 ttl pk-yrs)    Types: Cigarettes    Start date: 64    Quit date: 35    Years since quitting: 38.8   Smokeless tobacco: Never  Vaping Use   Vaping status: Never Used  Substance and Sexual Activity   Alcohol  use: Not Currently    Comment: maybe one glass of champagne on holidays   Drug use: No   Sexual activity: Not on file  Other Topics Concern   Not on file  Social History Narrative   Not on file   Social Drivers of Health   Financial Resource Strain: Not on file  Food Insecurity: No Food Insecurity (10/03/2024)   Hunger Vital Sign    Worried About Running Out of Food in the Last Year: Never true    Ran Out of Food in the Last Year: Never true  Transportation Needs: No Transportation Needs (10/03/2024)   PRAPARE - Administrator, Civil Service (Medical): No    Lack of Transportation (Non-Medical): No  Physical Activity: Not on file  Stress: Not on file  Social Connections: Moderately Integrated (09/27/2024)   Social Connection and Isolation Panel    Frequency of Communication with Friends and Family: Three times a week    Frequency of Social Gatherings with Friends and Family: Three times a week    Attends Religious Services: More than 4 times per year    Active Member of Clubs or Organizations: No    Attends Banker Meetings: Never    Marital Status:  Married  Catering manager Violence: Not At Risk (10/03/2024)   Humiliation, Afraid, Rape, and Kick questionnaire    Fear of Current or Ex-Partner: No    Emotionally Abused: No    Physically Abused: No    Sexually Abused: No    FAMILY HISTORY: Family History  Problem Relation Age of Onset   Hypertension Mother    Diabetes Mother    Other Mother        hay fever   Pneumonia Mother        aspiration   Gout Father    Skin cancer Father    Heart attack Father  Kidney failure Father    Hypertension Father    CVA Father    CAD Father    Asthma Brother    Other Brother        hay fever   Diabetes type II Maternal Grandfather    Alcoholism Brother     ALLERGIES:  She is allergic to penicillins, accupril [quinapril hcl], januvia [sitagliptin], monistat [miconazole], tioconazole, and zestril [lisinopril].  MEDICATIONS:  Current Outpatient Medications  Medication Sig Dispense Refill   acetaminophen  (TYLENOL ) 500 MG tablet Take 2 tablets (1,000 mg total) by mouth every 6 (six) hours. 100 tablet 0   allopurinol  (ZYLOPRIM ) 300 MG tablet Take 300 mg by mouth in the morning.     amLODipine  (NORVASC ) 10 MG tablet Take 10 mg by mouth in the morning.     bacitracin 500 UNIT/GM ointment 1 application to neck incision Externally twice a day     bacitracin ointment Apply to neck incision twice daily for 7 days 30 g 0   cephALEXin (KEFLEX) 500 MG capsule Take 1 capsule (500 mg total) by mouth 2 (two) times daily for 7 days. 14 capsule 0   cetirizine (ZYRTEC) 10 MG tablet Take 10 mg by mouth in the morning.     ciprofloxacin (CIPRO) 500 MG tablet Take 500 mg by mouth 2 (two) times daily. for 5 days     colchicine  0.6 MG tablet TAKE 1 TABLET BY MOUTH TWICE A DAY (Patient taking differently: Take 0.6 mg by mouth 2 (two) times daily as needed (gout).) 30 tablet 0   fenofibrate  (TRICOR ) 48 MG tablet Take 1 tablet (48 mg total) by mouth daily. 90 tablet 3   hydrochlorothiazide  (MICROZIDE ) 12.5 MG  capsule Take 12.5 mg by mouth in the morning.     icosapent  Ethyl (VASCEPA ) 1 g capsule Take 2 capsules (2 g total) by mouth 2 (two) times daily. 120 capsule 3   irbesartan  (AVAPRO ) 300 MG tablet TAKE 1 TABLET BY MOUTH DAILY 90 tablet 3   metFORMIN  (GLUCOPHAGE ) 1000 MG tablet Take 1,000 mg by mouth 2 (two) times daily with a meal.     metoprolol  tartrate (LOPRESSOR ) 25 MG tablet Take 75 mg by mouth 2 (two) times daily.     nystatin  cream (MYCOSTATIN ) Apply 1 application topically daily as needed for rash.     ondansetron  (ZOFRAN -ODT) 4 MG disintegrating tablet Take 4 mg by mouth every 8 (eight) hours as needed for nausea or vomiting.     oxyCODONE  (ROXICODONE ) 5 MG immediate release tablet Take 1 tablet (5 mg total) by mouth every 4 (four) hours as needed for up to 7 days. 30 tablet 0   pantoprazole  (PROTONIX ) 40 MG tablet Take 40 mg by mouth 2 (two) times daily.     Pentosan Polysulfate Sodium  200 MG CPDR Take 200 mg by mouth 2 (two) times daily.     polyvinyl alcohol  (LIQUIFILM TEARS) 1.4 % ophthalmic solution Place 1 drop into both eyes as needed for dry eyes.     PREVIDENT 5000 SENSITIVE 1.1-5 % GEL APPLY 5 GRAMS TO THE TEETH AT BEDTIME. USE WITH FLUORIDE TRAYS AT NIGHT. KEEP IT IN FOR 20 MINUTES     rosuvastatin  (CRESTOR ) 10 MG tablet Take 1 tablet (10 mg total) by mouth daily. 90 tablet 3   Sod Fluoride-Potassium Nitrate 1.1-4.5 % GEL Place 5 g onto teeth.     trimethoprim  (TRIMPEX ) 100 MG tablet Take 100 mg by mouth every evening.     valACYclovir (VALTREX) 500 MG  tablet Take 500 mg by mouth daily.     No current facility-administered medications for this visit.    REVIEW OF SYSTEMS:    Review of Systems - Oncology  All other pertinent systems were reviewed with the patient and are negative.  PHYSICAL EXAMINATION:    Onc Performance Status - 10/03/24 1000       ECOG Perf Status   ECOG Perf Status Ambulatory and capable of all selfcare but unable to carry out any work  activities.  Up and about more than 50% of waking hours      KPS SCALE   KPS % SCORE Normal activity with effort, some s/s of disease          Vitals:   10/03/24 1028 10/03/24 1035  BP: (!) 156/64 (!) 142/60  Pulse: 80   Resp: 15   Temp: 97.8 F (36.6 C)   SpO2: 98%    Filed Weights   10/03/24 1028  Weight: 208 lb 6.4 oz (94.5 kg)    Physical Exam Constitutional:      General: She is not in acute distress.    Appearance: Normal appearance.  HENT:     Head: Normocephalic and atraumatic.  Neck:     Comments: Right-sided scar from recent surgery with staples in place, healing well, without any signs of infection Cardiovascular:     Rate and Rhythm: Normal rate.     Heart sounds: Normal heart sounds.  Pulmonary:     Effort: Pulmonary effort is normal. No respiratory distress.     Breath sounds: Normal breath sounds.  Abdominal:     General: There is no distension.  Lymphadenopathy:     Cervical: No cervical adenopathy.  Neurological:     General: No focal deficit present.     Mental Status: She is alert and oriented to person, place, and time.  Psychiatric:        Mood and Affect: Mood normal.        Behavior: Behavior normal.     LABORATORY DATA:   I have reviewed the data as listed.  Results for orders placed or performed in visit on 10/03/24  CMP (Cancer Center only)  Result Value Ref Range   Sodium 138 135 - 145 mmol/L   Potassium 3.6 3.5 - 5.1 mmol/L   Chloride 101 98 - 111 mmol/L   CO2 28 22 - 32 mmol/L   Glucose, Bld 138 (H) 70 - 99 mg/dL   BUN 13 8 - 23 mg/dL   Creatinine 8.97 (H) 9.55 - 1.00 mg/dL   Calcium  9.8 8.9 - 10.3 mg/dL   Total Protein 7.2 6.5 - 8.1 g/dL   Albumin 4.2 3.5 - 5.0 g/dL   AST 32 15 - 41 U/L   ALT 28 0 - 44 U/L   Alkaline Phosphatase 58 38 - 126 U/L   Total Bilirubin 0.7 0.0 - 1.2 mg/dL   GFR, Estimated 59 (L) >60 mL/min   Anion gap 9 5 - 15  CBC with Differential (Cancer Center Only)  Result Value Ref Range   WBC  Count 9.9 4.0 - 10.5 K/uL   RBC 3.95 3.87 - 5.11 MIL/uL   Hemoglobin 10.6 (L) 12.0 - 15.0 g/dL   HCT 67.3 (L) 63.9 - 53.9 %   MCV 82.5 80.0 - 100.0 fL   MCH 26.8 26.0 - 34.0 pg   MCHC 32.5 30.0 - 36.0 g/dL   RDW 84.5 88.4 - 84.4 %   Platelet Count 290 150 -  400 K/uL   nRBC 0.0 0.0 - 0.2 %   Neutrophils Relative % 63 %   Neutro Abs 6.3 1.7 - 7.7 K/uL   Lymphocytes Relative 22 %   Lymphs Abs 2.2 0.7 - 4.0 K/uL   Monocytes Relative 10 %   Monocytes Absolute 1.0 0.1 - 1.0 K/uL   Eosinophils Relative 3 %   Eosinophils Absolute 0.3 0.0 - 0.5 K/uL   Basophils Relative 1 %   Basophils Absolute 0.1 0.0 - 0.1 K/uL   Immature Granulocytes 1 %   Abs Immature Granulocytes 0.05 0.00 - 0.07 K/uL  TSH  Result Value Ref Range   TSH 2.000 0.350 - 4.500 uIU/mL    RADIOGRAPHIC STUDIES:  I have personally reviewed the radiological images as listed and agree with the findings in the report.  NM PET Image Initial (PI) Skull Base To Thigh (F-18 FDG) CLINICAL DATA:  Initial treatment strategy for squamous cell carcinoma of unknown primary.  EXAM: NUCLEAR MEDICINE PET SKULL BASE TO THIGH  TECHNIQUE: 10.3 mCi F-18 FDG was injected intravenously. Full-ring PET imaging was performed from the skull base to thigh after the radiotracer. CT data was obtained and used for attenuation correction and anatomic localization.  Fasting blood glucose: 159 mg/dl  COMPARISON:  Neck CT 95/83/7974.  Abdomen pelvis CT 01/29/2024  FINDINGS: Mediastinal blood pool activity: SUV max 3.3  Liver activity: SUV max NA  NECK: The patient's known right-sided neck mass demonstrates low level heterogeneous peripheral FDG accumulation with SUV max = 4.3. No other hypermetabolic lymphadenopathy in the neck.  Incidental CT findings: None.  CHEST: No hypermetabolic mediastinal or hilar nodes. No suspicious pulmonary nodules on the CT scan.  Incidental CT findings: Coronary artery calcification is evident. Mild  atherosclerotic calcification is noted in the wall of the thoracic aorta.  ABDOMEN/PELVIS: No abnormal hypermetabolic activity within the liver, pancreas, adrenal glands, or spleen. No hypermetabolic lymph nodes in the abdomen or pelvis.  Incidental CT findings: Enlargement of the lateral segment left liver with nodular hepatic contour suggests cirrhosis. There is moderate atherosclerotic calcification of the abdominal aorta without aneurysm. Circumferential wall thickening in the stomach without hypermetabolism likely related to underdistention.  SKELETON: No focal hypermetabolic activity to suggest skeletal metastasis.  Incidental CT findings: Lumbar fusion hardware evident.  IMPRESSION: 1. The patient's known right-sided neck mass demonstrates low level hypermetabolic heterogeneous peripheral FDG accumulation. No other hypermetabolic lymphadenopathy in the neck. 2. No evidence for hypermetabolic metastatic disease in the chest, abdomen, or pelvis. 3. Enlargement of the lateral segment left liver with nodular hepatic contour suggests cirrhosis. 4.  Aortic Atherosclerosis (ICD10-I70.0).  Electronically Signed   By: Camellia Candle M.D.   On: 08/15/2024 08:49    Orders Placed This Encounter  Procedures   TSH    Standing Status:   Future    Number of Occurrences:   1    Expiration Date:   10/03/2025   CBC with Differential (Cancer Center Only)    Standing Status:   Future    Number of Occurrences:   1    Expiration Date:   10/03/2025   CMP (Cancer Center only)    Standing Status:   Future    Number of Occurrences:   1    Expiration Date:   10/03/2025    CODE STATUS:  Code Status History     Date Active Date Inactive Code Status Order ID Comments User Context   09/26/2024 2140 09/27/2024 1930 Full Code [id number]  Tobie Comp  B, MD Inpatient   08/04/2024 1307 08/05/2024 0505 Full Code [id number]  Hughes Simmonds, MD HOV   02/04/2022 0535 02/07/2022 1643 Full Code [id number]   Emilio Norleen BIRCH, PA-C ED   02/01/2022 1410 02/02/2022 1727 Full Code [id number]  Onetha Kuba, MD Inpatient    Questions for Most Recent Historical Code Status (Order [id number])     Question Answer   By: Consent: discussion documented in EHR            Future Appointments  Date Time Provider Department Center  10/08/2024 12:45 PM [facility] NURSE [facility] None  10/08/2024  1:30 PM Izell Domino, MD [facility] None  10/09/2024  8:15 AM Tobie Eldora NOVAK, MD [facility] None     I spent a total of 70 minutes during this encounter with the patient including review of chart and various tests results, discussions about plan of care and coordination of care plan.  This document was completed utilizing speech recognition software. Grammatical errors, random word insertions, pronoun errors, and incomplete sentences are an occasional consequence of this system due to software limitations, ambient noise, and hardware issues. Any formal questions or concerns about the content, text or information contained within the body of this dictation should be directly addressed to the provider for clarification.

## 2024-10-04 NOTE — Progress Notes (Signed)
 Dear Dr. Austin, Here is my assessment for our mutual patient, Diane Mayo. Thank you for allowing me the opportunity to care for your patient. Please do not hesitate to contact me should you have any other questions. Sincerely, Dr. Eldora Blanch  Otolaryngology Clinic Note Referring provider: Dr. Austin HPI:  Jaydy Fitzhenry is a 73 y.o. female kindly referred by Dr. Austin for evaluation of right neck  Initial visit (06/2024):  Patient reports: noticed in January incidentally. Maybe some bigger. Not tender, some pressure sensation. Warm compress does help. No antecedent event including URI. No cat scratches, animal exposures, fevers, B symptoms. Patient otherwise denies: - dysphagia, odynophagia, unintentional weight loss - changes in voice, shortness of breath, hemoptysis - ear pain, other neck masses - No skin cancers or face or neck  No biopsies of the area  --------------------------------------------------------- 08/14/2024 Seen in follow up. We discussed her biopsy results. She continues to deny any head and neck symptoms except for some pressure sensation over the neck.   --------------------------------------------------------- 09/22/2024 Returns for follow up. We discussed her additional genomics results. Continues to have no H&N symptoms except mild pressure.    ENT Surgery: no Personal or FHx of bleeding dz or anesthesia difficulty: no  GLP-1: no AP/AC: no  Tobacco: former, 5 year. Alcohol : no significant use.  PMHx: HTN, HLD, Gout, DM, HF  Independent Review of Additional Tests or Records:  Labs CMP 05/14/2024: BUN/Cr 19/1  CT Neck 04/09/2024 and US  03/18/2024 independently interpreted: Noted right level 2 cystic lesion - does not appear related to parotid, no parotid nodes; query small right submandibular gland nodule; no other significant LAD identified; prominent lingual tonsils; query small hypopharynx/AE fold asymmetry (just effacement?)  Dr. Austin referral notes (05/05/2024):  right neck mass, possible branchial cleft v/s cystic LN; ref to ENT  FNA 08/04/2024: Squamous cell carcinoma - patch p16, reports does not support HPV related SCCa - Additional genomics testing (09/10/2024): High risk HPV DETECTED (16 or 18)  PMH/Meds/All/SocHx/FamHx/ROS:   Past Medical History:  Diagnosis Date   Anemia    Diabetes mellitus without complication (HCC)    Type 2   Family history of adverse reaction to anesthesia    mother slow to wake up   Genital herpes    GERD (gastroesophageal reflux disease)    Hypertension    Interstitial cystitis    Left bundle branch block (LBBB)    chronic   Leukocytosis    NASH (nonalcoholic steatohepatitis)    Osteoarthritis    Plantar fasciitis      Past Surgical History:  Procedure Laterality Date   BACK SURGERY     L3 L4   CARPAL TUNNEL RELEASE Bilateral    CESAREAN SECTION     twice 1978 and 1987   CHOLECYSTECTOMY     DILATION AND CURETTAGE OF UTERUS  1975   DIRECT LARYNGOSCOPY N/A 09/26/2024   Procedure: LARYNGOSCOPY, DIRECT;  Surgeon: Blanch Eldora NOVAK, MD;  Location: [facility] OR;  Service: ENT;  Laterality: N/A;   EYE SURGERY Bilateral 2021   cataracts removed   RADICAL NECK DISSECTION Right 09/26/2024   Procedure: DISSECTION, NECK, RADICAL;  Surgeon: Blanch Eldora NOVAK, MD;  Location: [facility] OR;  Service: ENT;  Laterality: Right;   SHOULDER SURGERY Right 2012   rotator cuff repair   TONSILLECTOMY Bilateral 09/26/2024   Procedure: TONSILLECTOMY;  Surgeon: Blanch Eldora NOVAK, MD;  Location: [facility] OR;  Service: ENT;  Laterality: Bilateral;   TUBAL LIGATION  1987    Family History  Problem Relation Age of Onset   Hypertension Mother    Diabetes Mother    Other Mother        hay fever   Pneumonia Mother        aspiration   Gout Father    Skin cancer Father    Heart attack Father    Kidney failure Father    Hypertension Father    CVA Father    CAD Father    Asthma Brother    Other Brother        hay fever   Diabetes type II Maternal  Grandfather    Alcoholism Brother      Social Connections: Moderately Integrated (09/27/2024)   Social Connection and Isolation Panel    Frequency of Communication with Friends and Family: Three times a week    Frequency of Social Gatherings with Friends and Family: Three times a week    Attends Religious Services: More than 4 times per year    Active Member of Clubs or Organizations: No    Attends Banker Meetings: Never    Marital Status: Married      Current Outpatient Medications:    allopurinol  (ZYLOPRIM ) 300 MG tablet, Take 300 mg by mouth in the morning., Disp: , Rfl:    amLODipine  (NORVASC ) 10 MG tablet, Take 10 mg by mouth in the morning., Disp: , Rfl:    cetirizine (ZYRTEC) 10 MG tablet, Take 10 mg by mouth in the morning., Disp: , Rfl:    colchicine  0.6 MG tablet, TAKE 1 TABLET BY MOUTH TWICE A DAY (Patient taking differently: Take 0.6 mg by mouth 2 (two) times daily as needed (gout).), Disp: 30 tablet, Rfl: 0   fenofibrate  (TRICOR ) 48 MG tablet, Take 1 tablet (48 mg total) by mouth daily., Disp: 90 tablet, Rfl: 3   hydrochlorothiazide  (MICROZIDE ) 12.5 MG capsule, Take 12.5 mg by mouth in the morning., Disp: , Rfl:    icosapent  Ethyl (VASCEPA ) 1 g capsule, Take 2 capsules (2 g total) by mouth 2 (two) times daily., Disp: 120 capsule, Rfl: 3   irbesartan  (AVAPRO ) 300 MG tablet, TAKE 1 TABLET BY MOUTH DAILY, Disp: 90 tablet, Rfl: 3   metFORMIN  (GLUCOPHAGE ) 1000 MG tablet, Take 1,000 mg by mouth 2 (two) times daily with a meal., Disp: , Rfl:    metoprolol  tartrate (LOPRESSOR ) 25 MG tablet, Take 75 mg by mouth 2 (two) times daily., Disp: , Rfl:    nystatin  cream (MYCOSTATIN ), Apply 1 application topically daily as needed for rash., Disp: , Rfl:    ondansetron  (ZOFRAN -ODT) 4 MG disintegrating tablet, Take 4 mg by mouth every 8 (eight) hours as needed for nausea or vomiting., Disp: , Rfl:    pantoprazole  (PROTONIX ) 40 MG tablet, Take 40 mg by mouth 2 (two) times daily.,  Disp: , Rfl:    Pentosan Polysulfate Sodium  200 MG CPDR, Take 200 mg by mouth 2 (two) times daily., Disp: , Rfl:    polyvinyl alcohol  (LIQUIFILM TEARS) 1.4 % ophthalmic solution, Place 1 drop into both eyes as needed for dry eyes., Disp: , Rfl:    PREVIDENT 5000 SENSITIVE 1.1-5 % GEL, APPLY 5 GRAMS TO THE TEETH AT BEDTIME. USE WITH FLUORIDE TRAYS AT NIGHT. KEEP IT IN FOR 20 MINUTES, Disp: , Rfl:    rosuvastatin  (CRESTOR ) 10 MG tablet, Take 1 tablet (10 mg total) by mouth daily., Disp: 90 tablet, Rfl: 3   Sod Fluoride-Potassium Nitrate 1.1-4.5 % GEL, Place 5 g onto teeth., Disp: , Rfl:  trimethoprim  (TRIMPEX ) 100 MG tablet, Take 100 mg by mouth every evening., Disp: , Rfl:    valACYclovir (VALTREX) 500 MG tablet, Take 500 mg by mouth daily., Disp: , Rfl:    acetaminophen  (TYLENOL ) 500 MG tablet, Take 2 tablets (1,000 mg total) by mouth every 6 (six) hours., Disp: 100 tablet, Rfl: 0   bacitracin 500 UNIT/GM ointment, 1 application to neck incision Externally twice a day, Disp: , Rfl:    bacitracin ointment, Apply to neck incision twice daily for 7 days, Disp: 30 g, Rfl: 0   ciprofloxacin (CIPRO) 500 MG tablet, Take 500 mg by mouth 2 (two) times daily. for 5 days, Disp: , Rfl:    oxyCODONE  (ROXICODONE ) 5 MG immediate release tablet, Take 1 tablet (5 mg total) by mouth every 4 (four) hours as needed for up to 7 days., Disp: 30 tablet, Rfl: 0   Physical Exam:   BP 137/75 (BP Location: Left Arm, Patient Position: Sitting, Cuff Size: Large)   Pulse 72   Ht 5' (1.524 m)   Wt 208 lb (94.3 kg)   SpO2 95%   BMI 40.62 kg/m   Salient findings:  CN II-XII intact Bilateral EAC clear and TM intact with well pneumatized middle ear spaces Anterior rhinoscopy: Septum intact; bilateral inferior turbinates without significant hypertrophy No lesions of oral cavity/oropharynx; no palpable tongue base lesions No obviously palpable neck masses/lymphadenopathy/thyromegaly - except for relatively soft level 2  mass (right), does NOT appear to be fixed No respiratory distress or stridor No noted worrisome skin lesions on face or neck  Seprately Identifiable Procedures:  Prior to initiating any procedures, risks/benefits/alternatives were explained to the patient and verbal consent obtained. Procedure Note (prior, not today) Pre-procedure diagnosis:  right neck mass, rule out airway lesion Post-procedure diagnosis: Same Procedure: Transnasal Fiberoptic Laryngoscopy, CPT 31575 - Mod 25 Indication: see above Complications: None apparent EBL: 0 mL  The procedure was undertaken to further evaluate the patient's complaint above, with mirror exam inadequate for appropriate examination due to gag reflex and poor patient tolerance  Procedure:  Patient was identified as correct patient. Verbal consent was obtained. The nose was sprayed with oxymetazoline and 4% lidocaine . The The flexible laryngoscope was passed through the nose to view the nasal cavity, pharynx (oropharynx, hypopharynx) and larynx.  The larynx was examined at rest and during multiple phonatory tasks. Documentation was obtained and reviewed with patient. The scope was removed. The patient tolerated the procedure well.  Findings: The nasal cavity and nasopharynx did not reveal any masses or lesions, mucosa appeared to be without obvious lesions. The tongue base, pharyngeal walls, piriform sinuses, vallecula, epiglottis and postcricoid region are normal in appearance - small nodule/heaped up mucosa right AE fold area but otherwise no lesions appreciated. The visualized portion of the subglottis and proximal trachea is widely patent. The vocal folds are mobile bilaterally. There are no lesions on the free edge of the vocal folds nor elsewhere in the larynx worrisome for malignancy.    Electronically signed by: Eldora KATHEE Blanch, MD 10/04/2024 12:35 PM   Impression & Plans:  Jalessa Peyser is a 73 y.o. female with:  1. Squamous cell carcinoma  metastatic to head and neck with unknown primary site (HCC)   2. Mass of right side of neck    We discussed her diagnosis and had a long discussion regarding further workup and management. Currently SCCa H&N, now additional genomics showing HPV (16 or 18)+ which we did discuss  We again dicussed her  PET and management. We discussed DL/Bx and options including neck dissection based on TB discussion. She would opt for search for primary if found, and if not, would likely to proceed with neck dissection in hopes of avoiding chemotherapy. She understands possible need for post-op radiation or chemotherapy depending on pathology findings.  She is ready to proceed  See below regarding exact medications prescribed this encounter including dosages and route: No orders of the defined types were placed in this encounter.     Thank you for allowing me the opportunity to care for your patient. Please do not hesitate to contact me should you have any other questions.  Sincerely, Eldora Blanch, MD Otolaryngologist (ENT), [facility] Phone: [phone] Fax: [phone]  10/04/2024, 12:35 PM   I have personally spent 30 minutes involved in face-to-face and non-face-to-face activities for this patient on the day of the visit.  Professional time spent excludes any procedures performed but includes the following activities, in addition to those noted in the documentation: preparing to see the patient (review of outside documentation and results), performing a medically appropriate examination, extensive counseling regarding management options and review of additional testing, documenting in the electronic health record

## 2024-10-07 ENCOUNTER — Encounter: Payer: Self-pay | Admitting: Oncology

## 2024-10-07 NOTE — Assessment & Plan Note (Signed)
 Please review oncology history for additional details and timeline of events.  Staging PET scan on 08/15/24: right-sided neck mass demonstrates low level heterogeneous peripheral FDG accumulation with SUV of 4.3 with no other hypermetabolic lymphadenopathy in the neck. No evidence for hypermetabolic metastatic disease in the chest, abdomen, or pelvis.    During her follow up on 08/14/24, patient opted to proceed with bilateral tonsillectomy and diagnostic laryngoscopy with biopsies.   Her case was previously discussed at our ENT multidisciplinary tumor board, and the consensus was that her PET demonstrates no evidence of tumor primary and to proceed with bilateral tonsillectomy and diagnostic laryngoscopy with biopsies.  Patient desired to wait until after her granddaughter's wedding and hence this was scheduled for 09/26/24.    On 09/26/2024, she underwent bilateral tonsillectomy and right-sided modified radical neck dissection with removal of 16 lymph nodes, direct laryngoscopy with biopsy.  Pathology showed benign tonsillar tissue bilaterally.  Right level 2A lymph node showed metastatic squamous cell carcinoma, measuring 5.2 x 4 x 3.5 cm.  Negative for extranodal extension.  All other lymph nodes were negative for carcinoma.  Submandibular gland was also benign. 1/16 LNs positive.  Immunostains for p16 showed strong diffuse positivity in the tumor.    cTx,cN2a,cM0.    Though she has N2a disease, no other high risk features noted, including lack of extranodal extension.  I recommended adjuvant radiation alone as per NCCN guidelines, placing chemotherapy in a weaker recommendation category (2B). Her case will be further discussed in multidisciplinary ENT conference.  - Plan for restaging with PET scan three months post-radiation.  Additional appointments with us  will be scheduled as needed based on final plan of care.

## 2024-10-08 ENCOUNTER — Ambulatory Visit: Admission: RE | Admit: 2024-10-08 | Discharge: 2024-10-08 | Attending: Radiation Oncology

## 2024-10-08 ENCOUNTER — Ambulatory Visit
Admission: RE | Admit: 2024-10-08 | Discharge: 2024-10-08 | Disposition: A | Discharge status: Home / Self Care | Source: Ambulatory Visit | Attending: Radiation Oncology | Admitting: Radiation Oncology

## 2024-10-08 DIAGNOSIS — C4442 Squamous cell carcinoma of skin of scalp and neck: Secondary | ICD-10-CM

## 2024-10-08 DIAGNOSIS — C801 Malignant (primary) neoplasm, unspecified: Secondary | ICD-10-CM | POA: Diagnosis not present

## 2024-10-08 DIAGNOSIS — C7989 Secondary malignant neoplasm of other specified sites: Secondary | ICD-10-CM | POA: Insufficient documentation

## 2024-10-08 NOTE — Progress Notes (Signed)
 Oncology Nurse Navigator Documentation   To provide support, encouragement and care continuity, met with Ms. Charette, her husband, and friend before her CT SIM.  She tolerated procedure without difficulty, denied questions/concerns.   I encouraged him to call me prior to 10/27/24 [place].   [name] RN, BSN, OCN Head & Neck Oncology Nurse Navigator [facility] Phone # [phone]  Fax # [phone]

## 2024-10-08 NOTE — Addendum Note (Signed)
 Encounter addended by: Dyana Norse, LPN on: 89/84/7974 2:41 PM  Actions taken: LDA properties accepted

## 2024-10-08 NOTE — Progress Notes (Signed)
 Has armband been applied?  Yes.    Does patient have an allergy to IV contrast dye?: No.   Has patient ever received premedication for IV contrast dye?: No.   Date of lab work: December 03, 2024 BUN: 13 CR: 1.02 eGFR: 59  Does patient take metformin ?: No.  Is eGFR >60?: No.  IV site: antecubital left, condition patent and no redness  Has IV site been added to flowsheet?  No.  BP (!) (P) 150/82 (BP Location: Left Arm, Patient Position: Sitting)   Pulse (P) 67   Temp (!) (P) 96.9 F (36.1 C) (Temporal)   Resp (P) 18   Ht (P) 5' (1.524 m)   Wt (P) 205 lb 6 oz (93.2 kg)   SpO2 (P) 98%   BMI (P) 40.11 kg/m

## 2024-10-08 NOTE — Addendum Note (Signed)
 Encounter addended by: Dyana Norse, LPN on: 89/84/7974 2:42 PM  Actions taken: LDA properties accepted, Clinical Note Signed

## 2024-10-09 ENCOUNTER — Encounter (INDEPENDENT_AMBULATORY_CARE_PROVIDER_SITE_OTHER): Payer: Self-pay | Admitting: Otolaryngology

## 2024-10-09 ENCOUNTER — Ambulatory Visit (INDEPENDENT_AMBULATORY_CARE_PROVIDER_SITE_OTHER): Admitting: Otolaryngology

## 2024-10-09 VITALS — BP 144/74 | HR 73 | Ht 60.0 in

## 2024-10-09 DIAGNOSIS — C7989 Secondary malignant neoplasm of other specified sites: Secondary | ICD-10-CM

## 2024-10-09 DIAGNOSIS — Z9889 Other specified postprocedural states: Secondary | ICD-10-CM

## 2024-10-09 NOTE — Progress Notes (Signed)
 S/p b/l Tonsillectomy, lingual tonsil bx and R ND 09/26/2024 S: Doing better, pain better. no bleeding from mouth or nose; no fevers or issue with wound. No tongue weakness, no lip weakness. No SOB. Discussed TB findings/discussion  O: CN II-XII intact; neck c/d/I, staples removed A/P: 73 y.o. y.o. w/ SCCa of unknown primary now s/p DL/Bx, tonsillectomy and R ND 09/26/2024.  We discussed her path -- no ENE; HPV+; based on TB discussion, will proceed with RT only. She is working with Dr. Izell for this F/u April 2026  Diane Mayo

## 2024-10-17 DIAGNOSIS — C7989 Secondary malignant neoplasm of other specified sites: Secondary | ICD-10-CM | POA: Diagnosis not present

## 2024-10-17 DIAGNOSIS — C4492 Squamous cell carcinoma of skin, unspecified: Secondary | ICD-10-CM | POA: Diagnosis not present

## 2024-10-17 DIAGNOSIS — I10 Essential (primary) hypertension: Secondary | ICD-10-CM | POA: Diagnosis not present

## 2024-10-17 DIAGNOSIS — R350 Frequency of micturition: Secondary | ICD-10-CM | POA: Diagnosis not present

## 2024-10-17 DIAGNOSIS — Z23 Encounter for immunization: Secondary | ICD-10-CM | POA: Diagnosis not present

## 2024-10-17 DIAGNOSIS — E782 Mixed hyperlipidemia: Secondary | ICD-10-CM | POA: Diagnosis not present

## 2024-10-17 DIAGNOSIS — I5032 Chronic diastolic (congestive) heart failure: Secondary | ICD-10-CM | POA: Diagnosis not present

## 2024-10-17 DIAGNOSIS — I700 Atherosclerosis of aorta: Secondary | ICD-10-CM | POA: Diagnosis not present

## 2024-10-17 DIAGNOSIS — E119 Type 2 diabetes mellitus without complications: Secondary | ICD-10-CM | POA: Diagnosis not present

## 2024-10-21 ENCOUNTER — Other Ambulatory Visit: Payer: Self-pay

## 2024-10-21 DIAGNOSIS — C4492 Squamous cell carcinoma of skin, unspecified: Secondary | ICD-10-CM

## 2024-10-22 ENCOUNTER — Telehealth: Payer: Self-pay

## 2024-10-22 NOTE — Telephone Encounter (Signed)
 [facility] Clinical Social Work  Clinical Social Work was referred by statistician for assessment of psychosocial needs.  Clinical Social Worker contacted patient by phone to offer support and assess for needs.      Follow Up Plan:  CSW will see patient on 11/05    Lizbeth Sprague, LCSW  Clinical Social Worker [facility]

## 2024-10-24 DIAGNOSIS — C7989 Secondary malignant neoplasm of other specified sites: Secondary | ICD-10-CM | POA: Diagnosis not present

## 2024-10-27 ENCOUNTER — Ambulatory Visit
Admission: RE | Admit: 2024-10-27 | Discharge: 2024-10-27 | Disposition: A | Discharge status: Home / Self Care | Source: Ambulatory Visit | Attending: Radiation Oncology | Admitting: Radiation Oncology

## 2024-10-27 ENCOUNTER — Other Ambulatory Visit: Payer: Self-pay

## 2024-10-27 DIAGNOSIS — Z510 Encounter for antineoplastic radiation therapy: Secondary | ICD-10-CM | POA: Insufficient documentation

## 2024-10-27 DIAGNOSIS — C7989 Secondary malignant neoplasm of other specified sites: Secondary | ICD-10-CM | POA: Insufficient documentation

## 2024-10-27 DIAGNOSIS — C801 Malignant (primary) neoplasm, unspecified: Secondary | ICD-10-CM | POA: Insufficient documentation

## 2024-10-27 LAB — RAD ONC ARIA SESSION SUMMARY
Course Elapsed Days: 0
Plan Fractions Treated to Date: 1
Plan Prescribed Dose Per Fraction: 2 Gy
Plan Total Fractions Prescribed: 30
Plan Total Prescribed Dose: 60 Gy
Reference Point Dosage Given to Date: 2 Gy
Reference Point Session Dosage Given: 2 Gy
Session Number: 1

## 2024-10-27 MED ORDER — SONAFINE EX EMUL
1.0000 | Freq: Once | CUTANEOUS | Status: AC
  Administered 2024-10-27: 1 via TOPICAL

## 2024-10-27 MED ORDER — SONAFINE EX EMUL: 1.0000 | Freq: Two times a day (BID) | CUTANEOUS | Status: DC

## 2024-10-28 ENCOUNTER — Inpatient Hospital Stay: Attending: Oncology | Admitting: Nutrition

## 2024-10-28 ENCOUNTER — Ambulatory Visit
Admission: RE | Admit: 2024-10-28 | Discharge: 2024-10-28 | Disposition: A | Source: Ambulatory Visit | Attending: Radiation Oncology

## 2024-10-28 ENCOUNTER — Other Ambulatory Visit: Payer: Self-pay

## 2024-10-28 DIAGNOSIS — C7989 Secondary malignant neoplasm of other specified sites: Secondary | ICD-10-CM | POA: Insufficient documentation

## 2024-10-28 DIAGNOSIS — Z87891 Personal history of nicotine dependence: Secondary | ICD-10-CM | POA: Insufficient documentation

## 2024-10-28 DIAGNOSIS — R110 Nausea: Secondary | ICD-10-CM | POA: Insufficient documentation

## 2024-10-28 DIAGNOSIS — C801 Malignant (primary) neoplasm, unspecified: Secondary | ICD-10-CM | POA: Insufficient documentation

## 2024-10-28 LAB — RAD ONC ARIA SESSION SUMMARY
Course Elapsed Days: 1
Plan Fractions Treated to Date: 2
Plan Prescribed Dose Per Fraction: 2 Gy
Plan Total Fractions Prescribed: 30
Plan Total Prescribed Dose: 60 Gy
Reference Point Dosage Given to Date: 4 Gy
Reference Point Session Dosage Given: 2 Gy
Session Number: 2

## 2024-10-28 NOTE — Therapy (Signed)
 OUTPATIENT PHYSICAL THERAPY HEAD AND NECK BASELINE EVALUATION   Patient Name: KLYN KROENING MRN: [id number] DOB:06-17-1951, 73 y.o., female Today's Date: 10/30/2024  END OF SESSION:  PT End of Session - 10/30/24 1050     Visit Number 1    Number of Visits 2    Date for Recertification  01/08/25    PT Start Time 1009    PT Stop Time 1046    PT Time Calculation (min) 37 min    Activity Tolerance Patient tolerated treatment well    Behavior During Therapy WFL for tasks assessed/performed          Past Medical History:  Diagnosis Date   Anemia    Diabetes mellitus without complication (HCC)    Type 2   Family history of adverse reaction to anesthesia    mother slow to wake up   Genital herpes    GERD (gastroesophageal reflux disease)    Hypertension    Interstitial cystitis    Left bundle branch block (LBBB)    chronic   Leukocytosis    NASH (nonalcoholic steatohepatitis)    Osteoarthritis    Plantar fasciitis    Past Surgical History:  Procedure Laterality Date   BACK SURGERY     L3 L4   CARPAL TUNNEL RELEASE Bilateral    CESAREAN SECTION     twice 1978 and 1987   CHOLECYSTECTOMY     DILATION AND CURETTAGE OF UTERUS  1975   DIRECT LARYNGOSCOPY N/A 09/26/2024   Procedure: LARYNGOSCOPY, DIRECT;  Surgeon: Tobie Eldora NOVAK, MD;  Location: [facility] OR;  Service: ENT;  Laterality: N/A;   EYE SURGERY Bilateral 2021   cataracts removed   RADICAL NECK DISSECTION Right 09/26/2024   Procedure: DISSECTION, NECK, RADICAL;  Surgeon: Tobie Eldora NOVAK, MD;  Location: [facility] OR;  Service: ENT;  Laterality: Right;   SHOULDER SURGERY Right 2012   rotator cuff repair   TONSILLECTOMY Bilateral 09/26/2024   Procedure: TONSILLECTOMY;  Surgeon: Tobie Eldora NOVAK, MD;  Location: [facility] OR;  Service: ENT;  Laterality: Bilateral;   TUBAL LIGATION  1987   Patient Active Problem List   Diagnosis Date Noted   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) 08/27/2024    Hypertriglyceridemia 11/26/2023   Acute respiratory failure with hypoxia (HCC) 02/04/2022   Acute pulmonary edema (HCC)    Sepsis (HCC)    Spinal stenosis of lumbar region 02/01/2022   Essential hypertension 07/08/2019   Candidal vulvovaginitis 01/25/2019   Diabetes mellitus (HCC) 01/25/2019   Menopausal symptom 01/25/2019   Obesity 01/25/2019   LBBB (left bundle branch block) 01/10/2018   Acute cystitis with hematuria 12/10/2014   Burning with urination 11/11/2014   Frequency of urination 08/13/2013   Chronic interstitial cystitis 08/07/2012    PCP: Arnett Repress, MD  REFERRING PROVIDER: Dr. 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    PERTINENT HISTORY:  SCC metastatic to right cervical lymph  node, unknown primary, stage IVA (T0 N2 M0 p 16 +. She presented to her PCP in late January of 2025 with concerns over a mass on her right neck. She underwent a head and neck US  on 03/18/24 showing a 4.5 cm complex cystic mass in or adjacent to the right parotid gland. To further investigate her symptoms she then underwent a neck CT on 04/09/24 showing a  right level 2A there is a predominantly cystic lesion measuring up to 3.2 cm in greatest axial dimension.   07/16/24 She saw Dr. Tobie for a consult regarding her mass. She then underwent a fine needle aspiration biopsy of right parotid with pathology indicating squamous cell carcinoma.  Immunohistochemical stain for p16 was performed. The stain show increased staining in the basal layer of the squamous epithelium but overall it is patchy. There is no definitive evidence of block like pattern of staining. The finding does not support a HPV related squamous cell carcinoma... The possibility of squamous cell carcinoma arising from a branchial cleft cyst is considered. However, a metastatic squamous cell carcinoma cannot be excluded. 08/15/24 PET showed a right-sided neck mass demonstrates low level heterogeneous peripheral FDG accumulation with SUV of 4.3 with no other hypermetabolic lymphadenopathy in the neck. No evidence for hypermetabolic metastatic disease in the chest, abdomen, or pelvis. 08/14/24 follow up with Dr. Tobie the patient opted to proceed with bilateral tonsillectomy and radical neck dissection (1/20 nodes) 09/26/24 (delayed due to a grandchild's wedding). She will receive 30 fractions of radiation to her Oropharynx and bilateral neck which started on 10/27/24 and complete 12/08/24. 09/26/24 Bilateral tonsillectomy with biopsies. Hx of back surgery L3/L4  PATIENT GOALS:   to be educated about the signs and symptoms of lymphedema and learn post op HEP.   PAIN:  Are you having pain? No  PRECAUTIONS: Active CA  RED FLAGS: None   WEIGHT BEARING RESTRICTIONS: No  FALLS:  Has patient fallen in last 6 months? No Does the patient have a fear of falling that limits activity? No Is the patient reluctant to leave the house due to a fear of falling?No  LIVING ENVIRONMENT: Patient lives with: husband Lives in: House/apartment Has following equipment at home: Single point cane and Grab bars  OCCUPATION: retired  LEISURE: does not exercise  PRIOR LEVEL OF FUNCTION: Independent   OBJECTIVE: Note: Objective measures were completed at Evaluation unless otherwise noted.  COGNITION: Overall cognitive status: Within functional limits for tasks assessed                   POSTURE:  Forward head and rounded shoulders posture  30 SEC SIT TO STAND: 10 reps in 30 sec without use of UEs which is  Below average for patient's age  SHOULDER AROM:   WFL   CERVICAL AROM:   Percent limited  Flexion WFL  Extension WFL  Right lateral flexion 25% limited  Left lateral flexion 25% limited  Right rotation WFL  Left rotation WFL    (Blank rows=not tested)  GAIT: Assessed: Yes Assistance needed: Modified Independent Ambulation Distance: 10 feet Assistive Device: none Gait pattern: R LE externally rotated  Ambulation surface: Level  PATIENT EDUCATION:  Education details: Neck ROM, importance of posture when sitting, standing and lying down, deep breathing, walking program and importance of staying active throughout treatment, CURE article on staying active, Why exercise? flyer, lymphedema and PT info Person educated: Patient Education method: Explanation, Demonstration, Handout Education comprehension: Patient verbalized understanding and returned demonstration  HOME  EXERCISE PROGRAM: Patient was instructed today in a home exercise program today for head and neck range of motion exercises. These included active cervical flexion, active cervical extension, active cervical rotation to each direction, upper trap stretch, and shoulder retraction. Patient was encouraged to do these 2-3 times a day, holding for 5 sec each and completing for 5 reps. Pt was educated that once this becomes easier then hold the stretches for 30-60 seconds.    ASSESSMENT:  CLINICAL IMPRESSION: Pt arrives to PT with recently diagnosed metastatic R cervical lymph node cancer with unknown primary. She will receive 30 fractions of radiation to her Oropharynx and bilateral neck which started on 10/27/24 and complete 12/08/24. Pt's cervical ROM was limited due to recent radical neck dissection. Educated pt about signs and symptoms of lymphedema as well as anatomy and physiology of lymphatic  system. Educated pt in importance of staying as active as possible throughout treatment to decrease fatigue as well as head and neck ROM exercises to decrease loss of ROM. Will see pt after completion of radiation to reassess ROM and assess for lymphedema and to determine therapy needs at that time.  Pt will benefit from skilled therapeutic intervention to improve on the following deficits: Decreased knowledge of precautions and postural dysfunction. Other deficits: decreased ROM  PT treatment/interventions: ADL/self-care home management, pt/family education, therapeutic exercise. Other interventions [id number]- PT Re-evaluation, 97110-Therapeutic exercises, 97530- Therapeutic activity, [id number]- Neuromuscular re-education, 97535- Self Care, [id number]- Manual therapy, 97760- Orthotic Initial, [phone]- Orthotic/Prosthetic subsequent, and Patient/Family education  REHAB POTENTIAL: Good  CLINICAL DECISION MAKING: Evolving/moderate complexity  EVALUATION COMPLEXITY: Moderate   GOALS: Goals reviewed with patient? YES  LONG TERM GOALS: (STG=LTG)   Name Target Date  Goal status  1 Patient will be able to verbalize understanding of a home exercise program for cervical range of motion, posture, and walking.   Baseline:  No knowledge 10/30/2024 Achieved at eval  2 Patient will be able to verbalize understanding of proper sitting and standing posture. Baseline:  No knowledge 10/30/2024 Achieved at eval  3 Patient will be able to verbalize understanding of lymphedema risk and availability of treatment for this condition Baseline:  No knowledge 10/30/2024 Achieved at eval  4 Pt will demonstrate a return to full cervical ROM and function post operatively compared to baselines and not demonstrate any signs or symptoms of lymphedema.  Baseline: See objective measurements taken today. 01/08/25 New    PLAN:  PT FREQUENCY/DURATION: EVAL and 1 follow up appointment.   PLAN FOR NEXT SESSION: will reassess 2 weeks after  completion of radiation to determine needs.  Patient will follow up at outpatient cancer rehab 2 weeks after completion of radiation.  If the patient requires physical therapy at that time, a specific plan will be dictated and sent to the referring physician for approval. The patient was educated today on appropriate basic range of motion exercises to begin now and continue throughout radiation and educated on the signs and symptoms of lymphedema. Patient verbalized good understanding.     Physical Therapy Information for During and After Head/Neck Cancer Treatment: Lymphedema is a swelling condition that you may be at risk for in your neck and/or face if you have radiation treatment to the area and/or if you have surgery that includes removing lymph nodes.  There is treatment available for this condition and it is not life-threatening.  Contact your physician or physical therapist with concerns. An excellent resource for those seeking information on lymphedema is the National Lymphedema Network's  website.  It can be accessed at www.lymphnet.org If you notice swelling in your neck or face at any time following surgery (even if it is many years from now), please contact your doctor or physical therapist to discuss this.  Lymphedema can be treated at any time but it is easier for you if it is treated early on. If you have had surgery to your neck, please check with your surgeon about how soon to start doing neck range of motion exercises.  If you are not having surgery, I encourage you to start doing neck range of motion exercises today and continue these while undergoing treatment, UNLESS you have irritation of your skin or soft tissue that is aggravated by doing them.  These exercises are intended to help you prevent loss of range of motion and/or to gain range of motion in your neck (which can be limited by tightening effects of radiation), and NOT to aggravate these tissues if they develop sensitivities  from treatment. Neck range of motion exercises should be done to the point of feeling a GENTLE, TOLERABLE stretch only.  You are encouraged to start a walking or other exercise program tomorrow and continue this as much as you are able through and after treatment.  Please feel free to call me with any questions. Florina Lanis Carbon, PT, CLT Physical Therapist and Certified Lymphedema Therapist [facility] [address], [address], [place], [place] [address] [phone] [email or web address]  WALKING  Walking is a great form of exercise to increase your strength, endurance and overall fitness.  A walking program can help you start slowly and gradually build endurance as you go.  Everyone's ability is different, so each person's starting point will be different.  You do not have to follow them exactly.  The are just samples. You should simply find out what's right for you and stick to that program.   In the beginning, you'll start off walking 2-3 times a day for short distances.  As you get stronger, you'll be walking further at just 1-2 times per day.  A. You Can Walk For A Certain Length Of Time Each Day    Walk 5 minutes 3 times per day.  Increase 2 minutes every 2 days (3 times per day).  Work up to 25-30 minutes (1-2 times per day).   Example:   Day 1-2 5 minutes 3 times per day   Day 7-8 12 minutes 2-3 times per day   Day 13-14 25 minutes 1-2 times per day  B. You Can Walk For a Certain Distance Each Day     Distance can be substituted for time.    Example:   3 trips to mailbox (at road)   3 trips to corner of block   3 trips around the block  C. Go to local high school and use the track.    Walk for distance ____ around track  Or time ____ minutes  D. Walk ____ Jog ____ Run ___   Why exercise?  So many benefits! Here are SOME of them: Heart health, including raising your good cholesterol level and reducing heart rate and blood  pressure Lung health, including improved lung capacity It burns fats, and most of us  can stand to be leaner, whether or not we are overweight. It increases the body's natural painkillers and mood elevators, so makes you feel better. Not only makes you feel better, but look better too Improves sleep Takes a bite out of stress May decrease  your risk of many types of cancer If you are currently undergoing cancer treatment, exercise may improve your ability to tolerate treatments including chemotherapy. For everybody, it can improve your energy level. Those with cancer-related fatigue report a 40-50% reduction in this symptom when exercising regularly. If you are a survivor of breast, colon, or prostate cancer, it may decrease your risk of a recurrence. (This may hold for other cancers too, but so far we have data just for these three types.)  How to exercise: Get your doctor's okay. Pick something you enjoy doing, like walking, Zumba, biking, swimming, or whatever. Start at low intensity and time, then gradually increase.  (See walking program handout.) Set a goal to achieve over time.  The American Cancer Society, American Heart Association, and U.S. Dept. of Health and Human Services recommend 150 minutes of moderate exercise, 75 minutes of vigorous exercise, or a combination of both per week. This should be done in episodes at least 10 minutes long, spread throughout the week.  Need help being motivated? Pick something you enjoy doing, because you'll be more inclined to stick with that activity than something that feels like a chore. Do it with a friend so that you are accountable to each other. Schedule it into your day. Place it on your calendar and keep that appointment just like you do any appointment that you make. Join an exercise group that meets at a specific time.  That way, you have to show up on time, and that makes it harder to procrastinate about doing your workout.  It also keeps  you accountable--people begin to expect you to be there. Join a gym where you feel comfortable and not intimidated, at the right cost. Sign up for something that you'll need to be in shape for on a specific date, like a 1K or a 5K to walk or run, a 20 or 30 mile bike ride, a mud run or something like that. If the date is looming, you know you'll need to train to be ready for it.  An added benefit is that many of these are fundraisers for good causes. If you've already paid for a gym membership, group exercise class or event, you might as well work out, so you haven't wasted your money!    [employer], PT 10/30/2024, 10:54 AM

## 2024-10-28 NOTE — Progress Notes (Signed)
 73 year old female diagnosed with head and neck cancer with unknown primary.  She is receiving 30 fractions of radiation therapy and has completed 2 treatments.  She is status post B/L tonsillectomy on October 3.  Past medical history includes hypertension, diabetes, GERD, NASH, LBBB, anemia, and interstitial cystitis.  Medications include Glucophage , Zofran , Protonix .  Labs include glucose 138 and creatinine 1.02 on October 10.  Height: 5 feet 0 inches. Weight: 206.6 pounds November 3. Patient weighed 211.4 pounds September 3. BMI: 40.2  Patient reports decreased oral intake since surgery.  Feels like there is something gritty in her mouth.  It is dry and she has been using a dry mouth spray.  Denies pain.  Understands she should not lose weight during treatment.  Nutrition diagnosis: Food and nutrition related knowledge deficit related to cancer and associated treatments as evidenced by no prior need for nutrition related information.  Intervention: Educated on importance of small frequent meals and snacks with adequate calories and protein to minimize weight loss. Reviewed soft moist foods. Educated regarding ONS if needed. Educated on strategies for dry mouth and encouraged baking soda and salt water gargles. Provided nutrition facts sheets and contact information.  Monitoring, evaluation, goals: Patient will tolerate adequate calories and protein to minimize weight loss.  Next visit: Tuesday, November 11 after radiation therapy with Elvie.  **Disclaimer: This note was dictated with voice recognition software. Similar sounding words can inadvertently be transcribed and this note may contain transcription errors which may not have been corrected upon publication of note.**

## 2024-10-29 ENCOUNTER — Inpatient Hospital Stay

## 2024-10-29 ENCOUNTER — Ambulatory Visit
Admission: RE | Admit: 2024-10-29 | Discharge: 2024-10-29 | Disposition: A | Source: Ambulatory Visit | Attending: Radiation Oncology

## 2024-10-29 ENCOUNTER — Other Ambulatory Visit: Payer: Self-pay

## 2024-10-29 LAB — RAD ONC ARIA SESSION SUMMARY
Course Elapsed Days: 2
Plan Fractions Treated to Date: 3
Plan Prescribed Dose Per Fraction: 2 Gy
Plan Total Fractions Prescribed: 30
Plan Total Prescribed Dose: 60 Gy
Reference Point Dosage Given to Date: 6 Gy
Reference Point Session Dosage Given: 2 Gy
Session Number: 3

## 2024-10-29 NOTE — Progress Notes (Signed)
 [facility] Clinical Social Work  Initial Assessment   Diane Mayo is a 73 y.o. year old female accompanied by patient and spouse. Clinical Social Work was referred by nurse navigator for assessment of psychosocial needs.   SDOH (Social Determinants of Health) assessments performed: Yes   SDOH Screenings   Food Insecurity: No Food Insecurity (10/29/2024)  Housing: Low Risk  (10/29/2024)  Transportation Needs: No Transportation Needs (10/29/2024)  Utilities: Not At Risk (10/29/2024)  Depression (PHQ2-9): Low Risk  (10/29/2024)  Social Connections: Moderately Integrated (09/27/2024)  Stress: No Stress Concern Present (10/29/2024)  Tobacco Use: Medium Risk (10/09/2024)    PHQ 2/9:    10/29/2024    2:32 PM 10/03/2024   10:00 AM  Depression screen PHQ 2/9  Decreased Interest 0 0  Down, Depressed, Hopeless 0 0  PHQ - 2 Score 0 0     Distress Screen completed: No     No data to display            Family/Social Information:  Housing Arrangement: patient lives with her spouse. Family members/support persons in your life? Family, Friends, and Estate Agent concerns: no  Employment: Retired Administrator .  Income source: Actor concerns: No Type of concern: None Food access concerns: no Religious or spiritual practice: Yes-Christian Arlis)  Advanced directives: Edyth documents completed external to [employer]. Patient's healthcare proxy daughter Almarie has a copy of documents.  Services Currently in place:  Income, Family, Transportation, Housing  Coping/ Adjustment to diagnosis: Patient understands treatment plan and what happens next? yes Concerns about diagnosis and/or treatment: No Concerns about diagnosis. Patient reported stressors: No reported Stressors.  Current coping skills/ strengths: Ability for insight , Active sense of humor , Average or above average intelligence , Capable of independent living , Communication  skills , Contractor , General fund of knowledge , Motivation for treatment/growth , Physical Health , Religious Affiliation , Special hobby/interest , and Supportive family/friends     SUMMARY: Current SDOH Barriers:  No SDOH Barriers  Clinical Social Work Clinical Goal(s):  No clinical social work goals at this time   Interventions: Discussed common feeling and emotions when being diagnosed with cancer, and the importance of support during treatment Informed patient of the support team roles and support services at [facility] Provided CSW contact information and encouraged patient to call with any questions or concerns Provided patient with information about Head and Neck Support Group  Provided education about [employer] Peer Guides.   Follow Up Plan: CSW will follow-up with patient by phone  3 weeks .  Patient verbalizes understanding of plan: Yes  Lizbeth Sprague, LCSW Clinical Social Worker [facility]

## 2024-10-30 ENCOUNTER — Ambulatory Visit: Attending: Radiation Oncology

## 2024-10-30 ENCOUNTER — Other Ambulatory Visit: Payer: Self-pay

## 2024-10-30 ENCOUNTER — Encounter: Payer: Self-pay | Admitting: Physical Therapy

## 2024-10-30 ENCOUNTER — Ambulatory Visit: Attending: Radiation Oncology | Admitting: Physical Therapy

## 2024-10-30 ENCOUNTER — Ambulatory Visit
Admission: RE | Admit: 2024-10-30 | Discharge: 2024-10-30 | Disposition: A | Discharge status: Home / Self Care | Source: Ambulatory Visit | Attending: Radiation Oncology | Admitting: Radiation Oncology

## 2024-10-30 DIAGNOSIS — R293 Abnormal posture: Secondary | ICD-10-CM | POA: Insufficient documentation

## 2024-10-30 DIAGNOSIS — C4492 Squamous cell carcinoma of skin, unspecified: Secondary | ICD-10-CM | POA: Insufficient documentation

## 2024-10-30 DIAGNOSIS — R1313 Dysphagia, pharyngeal phase: Secondary | ICD-10-CM | POA: Diagnosis not present

## 2024-10-30 DIAGNOSIS — C7989 Secondary malignant neoplasm of other specified sites: Secondary | ICD-10-CM | POA: Insufficient documentation

## 2024-10-30 LAB — RAD ONC ARIA SESSION SUMMARY
Course Elapsed Days: 3
Plan Fractions Treated to Date: 4
Plan Prescribed Dose Per Fraction: 2 Gy
Plan Total Fractions Prescribed: 30
Plan Total Prescribed Dose: 60 Gy
Reference Point Dosage Given to Date: 8 Gy
Reference Point Session Dosage Given: 2 Gy
Session Number: 4

## 2024-10-30 NOTE — Therapy (Signed)
 OUTPATIENT SPEECH LANGUAGE PATHOLOGY ONCOLOGY EVALUATION   Patient Name: Diane Mayo MRN: [id number] DOB:[birth year], 73 y.o., female Today's Date: 10/30/2024  PCP: Austin ROCKFORD , MD REFERRING PROVIDER: Izell Domino, MD  END OF SESSION:  End of Session - 10/30/24 2254     Visit Number 1    Number of Visits 3    Date for Recertification  01/28/25    SLP Start Time 1103    SLP Stop Time  1140    SLP Time Calculation (min) 37 min    Activity Tolerance Patient tolerated treatment well          Past Medical History:  Diagnosis Date   Anemia    Diabetes mellitus without complication (HCC)    Type 2   Family history of adverse reaction to anesthesia    mother slow to wake up   Genital herpes    GERD (gastroesophageal reflux disease)    Hypertension    Interstitial cystitis    Left bundle branch block (LBBB)    chronic   Leukocytosis    NASH (nonalcoholic steatohepatitis)    Osteoarthritis    Plantar fasciitis    Past Surgical History:  Procedure Laterality Date   BACK SURGERY     L3 L4   CARPAL TUNNEL RELEASE Bilateral    CESAREAN SECTION     twice 1978 and 1987   CHOLECYSTECTOMY     DILATION AND CURETTAGE OF UTERUS  1975   DIRECT LARYNGOSCOPY N/A 09/26/2024   Procedure: LARYNGOSCOPY, DIRECT;  Surgeon: Tobie Eldora NOVAK, MD;  Location: [facility] OR;  Service: ENT;  Laterality: N/A;   EYE SURGERY Bilateral 2021   cataracts removed   RADICAL NECK DISSECTION Right 09/26/2024   Procedure: DISSECTION, NECK, RADICAL;  Surgeon: Tobie Eldora NOVAK, MD;  Location: [facility] OR;  Service: ENT;  Laterality: Right;   SHOULDER SURGERY Right 2012   rotator cuff repair   TONSILLECTOMY Bilateral 09/26/2024   Procedure: TONSILLECTOMY;  Surgeon: Tobie Eldora NOVAK, MD;  Location: [facility] OR;  Service: ENT;  Laterality: Bilateral;   TUBAL LIGATION  1987   Patient Active Problem List   Diagnosis Date Noted   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) 08/27/2024    Hypertriglyceridemia 11/26/2023   Acute respiratory failure with hypoxia (HCC) 02/04/2022   Acute pulmonary edema (HCC)    Sepsis (HCC)    Spinal stenosis of lumbar region 02/01/2022   Essential hypertension 07/08/2019   Candidal vulvovaginitis 01/25/2019   Diabetes mellitus (HCC) 01/25/2019   Menopausal symptom 01/25/2019   Obesity 01/25/2019   LBBB (left bundle branch block) 01/10/2018   Acute cystitis with hematuria 12/10/2014   Burning with urination 11/11/2014   Frequency of urination 08/13/2013   Chronic interstitial cystitis 08/07/2012    ONSET DATE: See pertinent info below   REFERRING DIAG: C44.92,C79.89 (ICD-10-CM) - 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  Pt accompanied by: significant other- husband Butch  PERTINENT HISTORY:  SCC metastatic to right cervical lymph node, unknown primary, stage IVA (T0 N2 M0 p 16 +. She presented to her PCP in late January of 2025 with concerns over a mass on her right neck. She underwent a head and neck US  on 03/18/24 showing a 4.5 cm complex cystic mass in or adjacent to  the right parotid gland. To further investigate her symptoms she then underwent a neck CT on 04/09/24 showing a  right level 2A there is a predominantly cystic lesion measuring up to 3.2 cm in greatest axial dimension. 07/16/24 She saw Dr. Tobie for a consult regarding her mass. She then underwent a fine needle aspiration biopsy of right parotid with pathology indicating squamous cell carcinoma. Immunohistochemical stain for p16 was performed. The stain show increased staining in the basal layer of the squamous epithelium but overall it is patchy. There is no definitive evidence of block like pattern of staining. The finding does not support a HPV related squamous cell  carcinoma... The possibility of squamous cell carcinoma arising from a branchial cleft cyst is considered. However, a metastatic squamous cell carcinoma cannot be excluded. 08/15/24 PET showed a right-sided neck mass demonstrates low level heterogeneous peripheral FDG accumulation with SUV of 4.3 with no other hypermetabolic lymphadenopathy in the neck. No evidence for hypermetabolic metastatic disease in the chest, abdomen, or pelvis. 08/14/24 follow up with Dr. Tobie the patient opted to proceed with bilateral tonsillectomy, radical neck dissection, and directed laryngoscopy with biopsies on 09/26/24 (delayed due to a grandchild's wedding). 08/27/24 Radiation consult and 10/03/24 medical oncology consult. Once 09/26/24 biopsy results returned radiation only was recommended. Treatment plan: She will receive 30 fractions of radiation to her Oropharynx and bilateral neck which started on 10/27/24 and complete 12/08/24.  PAIN:  Are you having pain? No  FALLS: Has patient fallen in last 6 months?  No  LIVING ENVIRONMENT: Lives with: lives with their spouse Lives in: House/apartment  PLOF:  Level of assistance: Independent with ADLs, Independent with IADLs Employment: Retired  PATIENT GOALS: Maintain WNL swallowing  OBJECTIVE:  Note: Objective measures were completed at Evaluation unless otherwise noted. DIAGNOSTIC FINDINGS: see pertinent info above  INSTRUMENTAL SWALLOW STUDY FINDINGS (MBSS) n/a  COGNITION: Overall cognitive status: Within functional limits for tasks assessed  LANGUAGE: Receptive and Expressive language appeared WNL.  ORAL MOTOR EXAMINATION: Overall status: WFL  MOTOR SPEECH: Overall motor speech: Appears intact Respiration: thoracic breathing and diaphragmatic/abdominal breathing Phonation: normal Resonance: WFL Articulation: Appears intact Intelligibility: Intelligible  SUBJECTIVE DYSPHAGIA REPORTS:  Date of onset: 5 weeks ago Reported symptoms: trouble getting  hung requiring condiments for bolus to move more efficiently  Current diet: Dysphagia 2 (chopped/minced), Dysphagia 1 (puree), thin liquids, and dys III if it's chewed well and/or with condiments  Co-morbid voice changes: No  FACTORS WHICH MAY INCREASE RISK OF ADVERSE EVENT IN PRESENCE OF ASPIRATION:  General health: well appearing  Risk factors: none evident   CLINICAL SWALLOW ASSESSMENT:   Dentition: adequate natural dentition Vocal quality at baseline: normal Patient directly observed with POs: Yes: dysphagia 3 (soft), dysphagia 1 (puree), and thin liquids  Feeding: able to feed self Liquids provided by: cup [place] Swallow Protocol: Pass Oral phase signs and symptoms: none noted Pharyngeal phase signs and symptoms: multiple swallows and complaints of residue on lt with ham; SLP had pt turn head to rt and take water and this cleared residue. SLP educated pt on turning head to lt with food items she thinks may have residue and then to flush if necessary (due to residue) with water, or puree, turning head to rt. No residue felt with puree.  TREATMENT DATE:  10/30/24: Research states the risk for dysphagia increases due to radiation and/or chemotherapy treatment due to a variety of factors, so SLP educated the pt about the possibility of reduced/limited ability for PO intake during rad tx. SLP also educated pt regarding possible changes to swallowing musculature after rad tx, and why adherence to dysphagia HEP provided today and PO consumption was necessary to inhibit muscle fibrosis following rad tx and to mitigate muscle disuse atrophy. SLP informed pt why this would be detrimental to their swallowing status and to their pulmonary health. Pt demonstrated understanding of these things to SLP. SLP encouraged pt to safely eat and drink as deep into their radiation/chemotherapy as possible  to provide the best possible long-term swallowing outcome for pt.  SLP then developed an individualized HEP for pt involving oral and pharyngeal strengthening and ROM and pt was instructed how to perform these exercises, including SLP demonstration. After SLP demonstration, pt return demonstrated each exercise. SLP ensured pt performance was correct prior to educating pt on next exercise. Pt required occasional min-mod cues faded to modified independent to perform HEP. Pt was instructed to complete this program 5-7 days/week, at least 20 reps a day until 6 months after his or her last day of rad tx, and then x2 a week after that, indefinitely. Among other modifications for days when pt cannot functionally swallow, SLP also suggested pt to perform only non-swallowing tasks on the handout/HEP, and if necessary to cycle through the swallowing portion so the full program of exercises can be completed instead of fatiguing on one of the swallowing exercises and being unable to perform the other swallowing exercises. SLP instructed that swallowing exercises should then be added back into the regimen as pt is able to do so.    PATIENT EDUCATION: Education details: late effects head/neck radiation on swallow function, HEP procedure, and modification to HEP when difficulty experienced with swallowing during and after radiation course Person educated: Patient and Spouse Education method: Explanation, Demonstration, Verbal cues, and Handouts Education comprehension: verbalized understanding, returned demonstration, verbal cues required, and needs further education   ASSESSMENT:  CLINICAL IMPRESSION: Patient is a 73 y.o. F who was seen today for assessment of swallowing as they undergo radiation/chemoradiation therapy. Today pt ate items from Dys III and Dys I and drank thin liquids without overt s/s oral difficulty. Pt reported pharyngeal residue with ham and this was cleared with head turn rt with water. At this  time pt swallowing is deemed WNL/WFL with these POs with head turn. No overt s/sx aspiration were observed. There are no overt s/s aspiration PNA observed by SLP nor any reported by pt at this time. Data indicate that pt's swallow ability will likely decrease over the course of radiation/chemoradiation therapy and could very well decline over time following the conclusion of that therapy due to muscle disuse atrophy and/or muscle fibrosis. Pt will cont to need to be seen by SLP in order to assess safety of PO intake, assess the need for recommending any objective swallow assessment, and ensuring pt is correctly completing the individualized HEP.  OBJECTIVE IMPAIRMENTS: include dysphagia. These impairments are limiting patient from safety when swallowing. Factors affecting potential to achieve goals and functional outcome are none noted today. Patient will benefit from skilled SLP services to address above impairments and improve overall function.   REHAB POTENTIAL: Good     GOALS: Goals reviewed with patient? No   SHORT TERM GOALS: Target: 3rd total session   1.  Pt will complete HEP with modified independence in 2 sessions Baseline: Goal status: INITIAL   2.  pt will tell SLP why pt is completing HEP with modified independence Baseline:  Goal status: INITIAL   3.  pt will describe 3 overt s/s aspiration PNA with modified independence Baseline:  Goal status: INITIAL   4.  pt will tell SLP how a food journal could hasten return to a more normalized diet Baseline:  Goal status: INITIAL     LONG TERM GOALS: Target: 7th total session   1.  pt will complete HEP with independence over two visits Baseline:  Goal status: INITIAL   2.  pt will describe how to modify HEP over time, and the timeline associated with reduction in HEP frequency with modified independence over two sessions Baseline:  Goal status: INITIAL     PLAN:   SLP FREQUENCY:  once approx every 4 weeks   SLP  DURATION:  7 sessions   PLANNED INTERVENTIONS: Aspiration precaution training, Pharyngeal strengthening exercises, Diet toleration management , Trials of upgraded texture/liquids, SLP instruction and feedback, Compensatory strategies, and Patient/family education, [phone] (treatment of swallowing dysfunction and/or oral function for feeding)    Yerik Zeringue, CCC-SLP 10/30/2024, 10:55 PM

## 2024-10-30 NOTE — Patient Instructions (Signed)
 SWALLOWING EXERCISES Do these 5-6 days/week until 6 months after your last day of radiation, then 2 days per week afterwards You can use 1-2 drops of liquid to help you swallow, if your mouth gets dry  Effortful Swallows - Press your tongue against the roof of your mouth for 3 seconds, then swallow as hard as you can - Do at least 20 reps/day, in sets of 5-10  Masako Swallow - swallow with your tongue sticking out - Stick tongue out past your lips and gently bite tongue with your teeth - Swallow, while holding your tongue with your teeth - Do at least 20 reps/day, in sets of 5-10   Shaker Exercise - head lift - Lie flat on your back in your bed, the floor, or a couch  - Raise your head and look at your feet - KEEP YOUR SHOULDERS DOWN - HOLD FOR 45-60 SECONDS, then lower your head back down - Repeat 3 times, 2-3 times a day  [employer] - "squeeze swallow" exercise - Swallow, and squeeze tight to keep your Adam's Apple up - Hold the squeeze for 5-7 seconds - then relax - Do at least 20 reps/day, in sets of 5-10

## 2024-10-31 ENCOUNTER — Ambulatory Visit
Admission: RE | Admit: 2024-10-31 | Discharge: 2024-10-31 | Disposition: A | Discharge status: Home / Self Care | Source: Ambulatory Visit | Attending: Radiation Oncology | Admitting: Radiation Oncology

## 2024-10-31 ENCOUNTER — Other Ambulatory Visit: Payer: Self-pay

## 2024-10-31 LAB — RAD ONC ARIA SESSION SUMMARY
Course Elapsed Days: 4
Plan Fractions Treated to Date: 5
Plan Prescribed Dose Per Fraction: 2 Gy
Plan Total Fractions Prescribed: 30
Plan Total Prescribed Dose: 60 Gy
Reference Point Dosage Given to Date: 10 Gy
Reference Point Session Dosage Given: 2 Gy
Session Number: 5

## 2024-11-03 ENCOUNTER — Ambulatory Visit
Admission: RE | Admit: 2024-11-03 | Discharge: 2024-11-03 | Disposition: A | Discharge status: Home / Self Care | Source: Ambulatory Visit | Attending: Radiation Oncology | Admitting: Radiation Oncology

## 2024-11-03 ENCOUNTER — Other Ambulatory Visit: Payer: Self-pay | Admitting: Radiation Oncology

## 2024-11-03 ENCOUNTER — Other Ambulatory Visit: Payer: Self-pay

## 2024-11-03 DIAGNOSIS — C7989 Secondary malignant neoplasm of other specified sites: Secondary | ICD-10-CM

## 2024-11-03 LAB — RAD ONC ARIA SESSION SUMMARY
Course Elapsed Days: 7
Plan Fractions Treated to Date: 6
Plan Prescribed Dose Per Fraction: 2 Gy
Plan Total Fractions Prescribed: 30
Plan Total Prescribed Dose: 60 Gy
Reference Point Dosage Given to Date: 12 Gy
Reference Point Session Dosage Given: 2 Gy
Session Number: 6

## 2024-11-03 MED ORDER — LIDOCAINE VISCOUS HCL 2 % MT SOLN: OROMUCOSAL | 3 refills | Status: DC

## 2024-11-04 ENCOUNTER — Other Ambulatory Visit: Payer: Self-pay

## 2024-11-04 ENCOUNTER — Ambulatory Visit
Admission: RE | Admit: 2024-11-04 | Discharge: 2024-11-04 | Disposition: A | Discharge status: Home / Self Care | Source: Ambulatory Visit | Attending: Radiation Oncology | Admitting: Radiation Oncology

## 2024-11-04 ENCOUNTER — Inpatient Hospital Stay: Admitting: Dietician

## 2024-11-04 LAB — RAD ONC ARIA SESSION SUMMARY
Course Elapsed Days: 8
Plan Fractions Treated to Date: 7
Plan Prescribed Dose Per Fraction: 2 Gy
Plan Total Fractions Prescribed: 30
Plan Total Prescribed Dose: 60 Gy
Reference Point Dosage Given to Date: 14 Gy
Reference Point Session Dosage Given: 2 Gy
Session Number: 7

## 2024-11-04 NOTE — Progress Notes (Signed)
 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  She is receiving 30 fractions of radiation therapy. S/p b/L tonsillectomy 10/3.    Met with patient and husband in office following radiation. She reports decreased intake secondary to altered taste. Recalls making egg salad this morning which she typically enjoys. This tasted awful. Does well with soggy cereal for breakfast. Has a hard time with po during the day, but tries to snack (cottage cheese/fruit, yogurt, broth). Patient tries to eat with husband at dinner and will have a milkshake (ice cream/milk) before bed. She has not tried Boost/Ensure, but agreeable to samples today. She is drinking lots of water and likes to crunch on ice.   Medications: reviewed   Labs: no new labs  Anthropometrics: Last wt 204.6 lb on 11/10 (aria)  11/3 - 206.6 lb  9/3 - 211.4 lb    NUTRITION DIAGNOSIS: Food and nutrition related knowledge deficit improving    INTERVENTION:  Encourage leaning into flavors that are working (able to taste sweet - suggested using sweet sauces to marinate/dipping) - shake/smoothie recipes provided  Continue baking soda salt water gargles, suggested trying before eating to aid with altered taste Reinforced importance of adequate calorie/protein energy intake to minimize loss of LBM/support post treatment healing  Pt agreeable to trying ONS. If tolerated, recommend 2 Ensure HP/equivalent - samples of Ensure HP, Boost VHC, CIB powder + coupons  Continue monitoring blood sugars Support and encouragement  Contact information     MONITORING, EVALUATION, GOAL: wt trends, intake   NEXT VISIT: Tuesday November 18 in office after RT (pt aware)

## 2024-11-05 ENCOUNTER — Other Ambulatory Visit: Payer: Self-pay

## 2024-11-05 ENCOUNTER — Ambulatory Visit
Admission: RE | Admit: 2024-11-05 | Discharge: 2024-11-05 | Disposition: A | Discharge status: Home / Self Care | Source: Ambulatory Visit | Attending: Radiation Oncology | Admitting: Radiation Oncology

## 2024-11-05 LAB — RAD ONC ARIA SESSION SUMMARY
Course Elapsed Days: 9
Plan Fractions Treated to Date: 8
Plan Prescribed Dose Per Fraction: 2 Gy
Plan Total Fractions Prescribed: 30
Plan Total Prescribed Dose: 60 Gy
Reference Point Dosage Given to Date: 16 Gy
Reference Point Session Dosage Given: 2 Gy
Session Number: 8

## 2024-11-06 ENCOUNTER — Other Ambulatory Visit: Payer: Self-pay

## 2024-11-06 ENCOUNTER — Other Ambulatory Visit: Payer: Self-pay | Admitting: Nurse Practitioner

## 2024-11-06 ENCOUNTER — Ambulatory Visit
Admission: RE | Admit: 2024-11-06 | Discharge: 2024-11-06 | Disposition: A | Source: Ambulatory Visit | Attending: Radiation Oncology

## 2024-11-06 LAB — RAD ONC ARIA SESSION SUMMARY
Course Elapsed Days: 10
Plan Fractions Treated to Date: 9
Plan Prescribed Dose Per Fraction: 2 Gy
Plan Total Fractions Prescribed: 30
Plan Total Prescribed Dose: 60 Gy
Reference Point Dosage Given to Date: 18 Gy
Reference Point Session Dosage Given: 2 Gy
Session Number: 9

## 2024-11-07 ENCOUNTER — Ambulatory Visit
Admission: RE | Admit: 2024-11-07 | Discharge: 2024-11-07 | Disposition: A | Discharge status: Home / Self Care | Source: Ambulatory Visit | Attending: Radiation Oncology | Admitting: Radiation Oncology

## 2024-11-07 ENCOUNTER — Other Ambulatory Visit: Payer: Self-pay

## 2024-11-07 LAB — RAD ONC ARIA SESSION SUMMARY
Course Elapsed Days: 11
Plan Fractions Treated to Date: 10
Plan Prescribed Dose Per Fraction: 2 Gy
Plan Total Fractions Prescribed: 30
Plan Total Prescribed Dose: 60 Gy
Reference Point Dosage Given to Date: 20 Gy
Reference Point Session Dosage Given: 2 Gy
Session Number: 10

## 2024-11-10 ENCOUNTER — Ambulatory Visit
Admission: RE | Admit: 2024-11-10 | Discharge: 2024-11-10 | Disposition: A | Discharge status: Home / Self Care | Source: Ambulatory Visit | Attending: Radiation Oncology | Admitting: Radiation Oncology

## 2024-11-10 ENCOUNTER — Other Ambulatory Visit: Payer: Self-pay | Admitting: Radiation Oncology

## 2024-11-10 ENCOUNTER — Ambulatory Visit

## 2024-11-10 ENCOUNTER — Other Ambulatory Visit: Payer: Self-pay

## 2024-11-10 DIAGNOSIS — C4492 Squamous cell carcinoma of skin, unspecified: Secondary | ICD-10-CM

## 2024-11-10 LAB — RAD ONC ARIA SESSION SUMMARY
Course Elapsed Days: 14
Plan Fractions Treated to Date: 11
Plan Prescribed Dose Per Fraction: 2 Gy
Plan Total Fractions Prescribed: 30
Plan Total Prescribed Dose: 60 Gy
Reference Point Dosage Given to Date: 22 Gy
Reference Point Session Dosage Given: 2 Gy
Session Number: 11

## 2024-11-10 MED ORDER — HYDROCODONE-ACETAMINOPHEN 5-325 MG PO TABS: 1.0000 | ORAL_TABLET | ORAL | 0 refills | Status: DC | PRN

## 2024-11-11 ENCOUNTER — Inpatient Hospital Stay: Admitting: Dietician

## 2024-11-11 ENCOUNTER — Other Ambulatory Visit: Payer: Self-pay

## 2024-11-11 ENCOUNTER — Ambulatory Visit
Admission: RE | Admit: 2024-11-11 | Discharge: 2024-11-11 | Disposition: A | Source: Ambulatory Visit | Attending: Radiation Oncology

## 2024-11-11 LAB — RAD ONC ARIA SESSION SUMMARY
Course Elapsed Days: 15
Plan Fractions Treated to Date: 12
Plan Prescribed Dose Per Fraction: 2 Gy
Plan Total Fractions Prescribed: 30
Plan Total Prescribed Dose: 60 Gy
Reference Point Dosage Given to Date: 24 Gy
Reference Point Session Dosage Given: 2 Gy
Session Number: 12

## 2024-11-11 NOTE — Progress Notes (Signed)
 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  She is receiving 30 fractions of radiation therapy. S/p b/L tonsillectomy 10/3.   Met with patient after radiation. She is having a hard week. Reports sores on her tongue and cheek. Throat is sore and swallowing is painful. She reports lidocaine  made her nauseous. Patient prescribed norco on 11/17. She is unsure if this is helping, but taking as scheduled. Patient has poor intake secondary to above. Tolerating warm foods and smooth textures best. Going to [employer] this afternoon. Hoping the minestrone soup goes down okay. She liked the CIB powder mixed with milk better than Boost/Ensure. Patient ordered this from [facility], however received the premixed bottles instead. She did not like these and plans to give them to her grandchildren. Patient denies nausea, vomiting, diarrhea. She has started miralax  BID for constipation. Last BM was yesterday.    Medications: reviewed   Labs: no new labs   Anthropometrics: Wt 199.8 lb on 11/17 (aria) decreased 2.3% in 7 days - clinically severe for time frame  11/10 - 204.6 lb  11/3 - 206.6 lb    NUTRITION DIAGNOSIS: Food and nutrition related knowledge deficit improving   INTERVENTION:  Encourage eating by the clock - bites/sips q2h choosing high calorie high protein foods/drinks  Take pain medication as prescribed. Pt to try rinsing with lidocaine  vs swallowing as suggested by Dr. Izell Recommend increasing CIB - 3x/day (additional samples provided) Continue bowel regimen Support and encouragement    MONITORING, EVALUATION, GOAL: wt trends, intake    NEXT VISIT: Tuesday November 25 after radiation (pt aware)

## 2024-11-12 ENCOUNTER — Other Ambulatory Visit: Payer: Self-pay

## 2024-11-12 ENCOUNTER — Ambulatory Visit
Admission: RE | Admit: 2024-11-12 | Discharge: 2024-11-12 | Disposition: A | Source: Ambulatory Visit | Attending: Radiation Oncology

## 2024-11-12 LAB — RAD ONC ARIA SESSION SUMMARY
Course Elapsed Days: 16
Plan Fractions Treated to Date: 13
Plan Prescribed Dose Per Fraction: 2 Gy
Plan Total Fractions Prescribed: 30
Plan Total Prescribed Dose: 60 Gy
Reference Point Dosage Given to Date: 26 Gy
Reference Point Session Dosage Given: 2 Gy
Session Number: 13

## 2024-11-13 ENCOUNTER — Ambulatory Visit
Admission: RE | Admit: 2024-11-13 | Discharge: 2024-11-13 | Disposition: A | Source: Ambulatory Visit | Attending: Radiation Oncology

## 2024-11-13 ENCOUNTER — Other Ambulatory Visit: Payer: Self-pay

## 2024-11-13 LAB — RAD ONC ARIA SESSION SUMMARY
Course Elapsed Days: 17
Plan Fractions Treated to Date: 14
Plan Prescribed Dose Per Fraction: 2 Gy
Plan Total Fractions Prescribed: 30
Plan Total Prescribed Dose: 60 Gy
Reference Point Dosage Given to Date: 28 Gy
Reference Point Session Dosage Given: 2 Gy
Session Number: 14

## 2024-11-14 ENCOUNTER — Other Ambulatory Visit: Payer: Self-pay

## 2024-11-14 ENCOUNTER — Ambulatory Visit
Admission: RE | Admit: 2024-11-14 | Discharge: 2024-11-14 | Disposition: A | Discharge status: Home / Self Care | Source: Ambulatory Visit | Attending: Radiation Oncology | Admitting: Radiation Oncology

## 2024-11-14 LAB — RAD ONC ARIA SESSION SUMMARY
Course Elapsed Days: 18
Plan Fractions Treated to Date: 15
Plan Prescribed Dose Per Fraction: 2 Gy
Plan Total Fractions Prescribed: 30
Plan Total Prescribed Dose: 60 Gy
Reference Point Dosage Given to Date: 30 Gy
Reference Point Session Dosage Given: 2 Gy
Session Number: 15

## 2024-11-15 ENCOUNTER — Encounter: Payer: Self-pay | Admitting: Radiation Oncology

## 2024-11-16 ENCOUNTER — Other Ambulatory Visit: Payer: Self-pay

## 2024-11-16 ENCOUNTER — Ambulatory Visit
Admission: RE | Admit: 2024-11-16 | Discharge: 2024-11-16 | Disposition: A | Discharge status: Home / Self Care | Source: Ambulatory Visit | Attending: Radiation Oncology | Admitting: Radiation Oncology

## 2024-11-16 ENCOUNTER — Other Ambulatory Visit: Payer: Self-pay | Admitting: Radiation Oncology

## 2024-11-16 LAB — RAD ONC ARIA SESSION SUMMARY
Course Elapsed Days: 20
Plan Fractions Treated to Date: 16
Plan Prescribed Dose Per Fraction: 2 Gy
Plan Total Fractions Prescribed: 30
Plan Total Prescribed Dose: 60 Gy
Reference Point Dosage Given to Date: 32 Gy
Reference Point Session Dosage Given: 2 Gy
Session Number: 16

## 2024-11-16 NOTE — Progress Notes (Signed)
 The On Call Nurse called me in the PM on 11-15-24 reporting uncontrolled nausea for Diane Mayo.  I asked the nurse to call in a Rx for Ondansetron  8mg  ODT tablet, take 1 tab Q 8hrs prn nausea, disp 30 with 3 refills.  The Nurse called the patient to inform her of this and that I would call shortly.  I called about 30 min later but the patient's phone went to VM.  I left a VM stating to take the medication as Rx'd and push fluids orally. If unable to tolerate fluids and showing signs of dehydration, I advised her to go to ER. -----------------------------------  Lauraine Golden, MD

## 2024-11-17 ENCOUNTER — Other Ambulatory Visit: Payer: Self-pay

## 2024-11-17 ENCOUNTER — Ambulatory Visit
Admission: RE | Admit: 2024-11-17 | Discharge: 2024-11-17 | Disposition: A | Discharge status: Home / Self Care | Source: Ambulatory Visit | Attending: Radiation Oncology | Admitting: Radiation Oncology

## 2024-11-17 ENCOUNTER — Other Ambulatory Visit: Payer: Self-pay | Admitting: Radiation Oncology

## 2024-11-17 DIAGNOSIS — C7989 Secondary malignant neoplasm of other specified sites: Secondary | ICD-10-CM

## 2024-11-17 DIAGNOSIS — C4442 Squamous cell carcinoma of skin of scalp and neck: Secondary | ICD-10-CM

## 2024-11-17 LAB — BASIC METABOLIC PANEL - CANCER CENTER ONLY
Anion gap: 15 (ref 5–15)
BUN: 17 mg/dL (ref 8–23)
CO2: 21 mmol/L — ABNORMAL LOW (ref 22–32)
Calcium: 10.3 mg/dL (ref 8.9–10.3)
Chloride: 99 mmol/L (ref 98–111)
Creatinine: 1.42 mg/dL — ABNORMAL HIGH (ref 0.44–1.00)
GFR, Estimated: 39 mL/min — ABNORMAL LOW (ref >60)
Glucose, Bld: 146 mg/dL — ABNORMAL HIGH (ref 70–99)
Potassium: 4.6 mmol/L (ref 3.5–5.1)
Sodium: 135 mmol/L (ref 135–145)

## 2024-11-17 LAB — RAD ONC ARIA SESSION SUMMARY
Course Elapsed Days: 21
Plan Fractions Treated to Date: 17
Plan Prescribed Dose Per Fraction: 2 Gy
Plan Total Fractions Prescribed: 30
Plan Total Prescribed Dose: 60 Gy
Reference Point Dosage Given to Date: 34 Gy
Reference Point Session Dosage Given: 2 Gy
Session Number: 17

## 2024-11-17 MED ORDER — SODIUM CHLORIDE 0.9 % IV SOLN
INTRAVENOUS | Status: DC
  Filled 2024-11-17: qty 250

## 2024-11-17 MED ORDER — PROMETHAZINE HCL 25 MG PO TABS: 25.0000 mg | ORAL_TABLET | Freq: Four times a day (QID) | ORAL | 5 refills | Status: AC | PRN

## 2024-11-17 MED ORDER — LORAZEPAM 0.5 MG PO TABS: 0.5000 mg | ORAL_TABLET | Freq: Three times a day (TID) | ORAL | 0 refills | Status: AC | PRN

## 2024-11-18 ENCOUNTER — Other Ambulatory Visit: Payer: Self-pay

## 2024-11-18 ENCOUNTER — Inpatient Hospital Stay

## 2024-11-18 ENCOUNTER — Other Ambulatory Visit: Payer: Self-pay | Admitting: Radiology

## 2024-11-18 ENCOUNTER — Ambulatory Visit
Admission: RE | Admit: 2024-11-18 | Discharge: 2024-11-18 | Disposition: A | Source: Ambulatory Visit | Attending: Radiation Oncology

## 2024-11-18 ENCOUNTER — Inpatient Hospital Stay: Admitting: Dietician

## 2024-11-18 VITALS — BP 140/64 | HR 73 | Temp 97.7°F | Resp 18 | Wt 195.0 lb

## 2024-11-18 DIAGNOSIS — C4442 Squamous cell carcinoma of skin of scalp and neck: Secondary | ICD-10-CM

## 2024-11-18 DIAGNOSIS — C7989 Secondary malignant neoplasm of other specified sites: Secondary | ICD-10-CM

## 2024-11-18 LAB — RAD ONC ARIA SESSION SUMMARY
Course Elapsed Days: 22
Plan Fractions Treated to Date: 18
Plan Prescribed Dose Per Fraction: 2 Gy
Plan Total Fractions Prescribed: 30
Plan Total Prescribed Dose: 60 Gy
Reference Point Dosage Given to Date: 36 Gy
Reference Point Session Dosage Given: 2 Gy
Session Number: 18

## 2024-11-18 MED ORDER — SODIUM CHLORIDE 0.9 % IV SOLN: Freq: Once | INTRAVENOUS | Status: AC

## 2024-11-18 MED ORDER — MAGIC MOUTHWASH W/LIDOCAINE: 10.0000 mL | Freq: Four times a day (QID) | ORAL | 3 refills | Status: DC | PRN

## 2024-11-18 NOTE — Progress Notes (Signed)
 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 She is receiving 30 fractions of radiation therapy. S/p b/L tonsillectomy 10/3.   Met with patient in [facility]. She is receiving supportive therapy with IVF. Patient experiencing altered taste. Patient has poorly controlled nausea. Phenergan  + ativan  Rx'd per Dr. Izell yesterday. Saliva is thick. Sore throat and pain with swallow worsening. Oral intake is poor. Yesterday had a couple bites of cream of wheat.This was horrible. Also had little bit of applesauce with medications. This morning had 1/4 cup of fruit cocktail and half of fried banana. She was able to get down 4oz glass of milk to take medications. Patient has not been tolerating CIB/milk supplements the last few days. Reports also having e-coli infection per urinalysis which is the last thing she needed. Currently taking antibiotics.   Medications: reviewed   Labs: 11/24 - glucose 146, Cr 1.42  Anthropometrics: Wt 192 lb on 11/24 (aria) decreased 4% in the last 7 days. Weights down 7% in 3 weeks (severe for time frame)  11/17 - 199.8 lb  11/10 - 204.6 lb  11/3 - 206.6 lb    NUTRITION DIAGNOSIS: Food and nutrition related knowledge deficit - continues    MALNUTRITION DIAGNOSIS: Pt at high risk for malnutrition given poor oral intake and severe wt loss - discussed with pt today    INTERVENTION:  Reinforced education about the importance of nutrition during treatment to maintain strength/weights and effects of nutrition status on the post treatment healing process  Expressed concerns regarding poor nutrition/hydration intake and encouraged consideration of PEG Discussed feeding tube in detail as well as showed bolus feeding ed video to pt and husband in [facility]  Pt will continue trying to increase po as she thinks more about tube Provided samples of plain KF 1.4 as well as juice type ONS for her to try Support and encouragement    MONITORING, EVALUATION, GOAL: wt  trends, intake    NEXT VISIT: Tuesday December 2 in [facility]

## 2024-11-18 NOTE — Patient Instructions (Signed)

## 2024-11-18 NOTE — Progress Notes (Signed)
 Pt presented to [facility] today for IVFs. Pt had c/o worsening sore throat and possible thrush. Upon assessment this RN noted white patches in throat. Pt informed this RN that she has been rinsing her mouth with the baking soda/salt water rinses and using the viscous lidocaine  as prescribed without relief. This RN reached out to Dr. Izell and Elverna RN in radonc and made them aware.

## 2024-11-18 NOTE — Progress Notes (Signed)
 Ms. Killian experiencing continued odynophagia and pain on her tongue. She has been using her lidocaine  rinse as prescribed. She is no longer taking Norco due to episodes of nausea/emesis. Of note, she has had a previous thrush infection, but is unsure if this feels similar.   On physical exam, there is significant oropharyngeal mucositis and a white coating on the top of the tongue with multiple oral ulcers. No clear thrush infection.   Magic mouthwash prescription sent to patient's preferred pharmacy today to help with ulcers and possible thrush. She understands that this will take the place of her previously prescribed lidocaine  rinse. Patient expressed understanding and is in agreement with the stated plan. She will see Dr. Izell next on 11/24/2024 for routine follow-up.     Leeroy Due, PA-C

## 2024-11-19 ENCOUNTER — Other Ambulatory Visit: Payer: Self-pay | Admitting: Radiology

## 2024-11-19 ENCOUNTER — Ambulatory Visit
Admission: RE | Admit: 2024-11-19 | Discharge: 2024-11-19 | Disposition: A | Source: Ambulatory Visit | Attending: Radiation Oncology

## 2024-11-19 ENCOUNTER — Other Ambulatory Visit: Payer: Self-pay

## 2024-11-19 LAB — RAD ONC ARIA SESSION SUMMARY
Course Elapsed Days: 23
Plan Fractions Treated to Date: 19
Plan Prescribed Dose Per Fraction: 2 Gy
Plan Total Fractions Prescribed: 30
Plan Total Prescribed Dose: 60 Gy
Reference Point Dosage Given to Date: 38 Gy
Reference Point Session Dosage Given: 2 Gy
Session Number: 19

## 2024-11-19 MED ORDER — NYSTATIN 100000 UNIT/ML MT SUSP: 15.0000 mL | Freq: Four times a day (QID) | OROMUCOSAL | 3 refills | Status: DC | PRN

## 2024-11-21 ENCOUNTER — Inpatient Hospital Stay

## 2024-11-21 VITALS — BP 116/55 | HR 78 | Resp 16

## 2024-11-21 DIAGNOSIS — C4492 Squamous cell carcinoma of skin, unspecified: Secondary | ICD-10-CM

## 2024-11-21 MED ORDER — SODIUM CHLORIDE 0.9 % IV SOLN: Freq: Once | INTRAVENOUS | Status: AC

## 2024-11-21 NOTE — Patient Instructions (Signed)
 Fluids Given Through an IV (IV Infusion Therapy): What to Expect IV infusion therapy is a treatment to deliver a fluid, called an infusion, into a vein. You may have IV infusion to get: Fluids. Medicines. Nutrition. Chemotherapy. This is medicines to stop or slow down cancer cells. Blood or blood products. Dye that is given before an MRI or a CT scan. This is called contrast dye. Tell a health care provider about: Any allergies you have. This includes allergies to anesthesia or dyes. All medicines you take. These include vitamins, herbs, eye drops, and creams. Any bleeding problems you have. Any surgeries you've had, including if you've had lymph nodes taken out of your armpit or if you have a arteriovenous fistula for dialysis. Any medical problems you have. Whether you're pregnant or may be pregnant. Whether you've used IV drugs. What are the risks? Your health care provider will talk with you about risks. These may include: Pain, bruising, or bleeding. Infection. The IV leaking or moving out of place. Damage to blood vessels or nerves. Allergic reactions to medicines or dyes. A blood clot. An air bubble in the vein, also called an air embolism. What happens before the procedure? Eat and drink only as you've been told. Ask about changing or stopping: Any medicines you take. Any vitamins, herbs, or supplements you take. What happens during the procedure?     Placing the catheter Your skin at the IV site will be washed with fluid that kills germs. This will help prevent infection. IV infusion therapy starts with a procedure to place a soft tube called a catheter into a vein. An IV tube will be attached to the catheter to let the infusion flow into your blood. Your catheter may be placed: Into a vein that is usually in the bend of the elbow, forearm, or back of the hand. This is called a peripheral IV catheter. This may need to be put into a vein each time you get an  infusion. Into a vein near your elbow. This is called a midline catheter or a peripherally inserted central catheter (PICC). These types of catheters may stay in place for weeks or months at a time so you can receive repeated infusions through it. Into a vein near your neck that leads to your heart. This is called a non-tunneled catheter. This is only used for short amounts of time because it can cause infection. Through the skin of your chest and into a large vein that leads to your heart. This is called a tunneled catheter. This may stay in your body for months or years. Into an implanted port. An implanted port is a device that is surgically inserted under the skin of the chest to provide long-term IV access. The catheter will connect the port to a large vein in the chest or upper arm. A port may be kept in place for many months or years. Each time you have an infusion, a needle will be inserted through your skin to connect the catheter to the port. Doing the infusion To start the infusion, your provider will: Attach the IV tubing to your catheter. Use a tape or a bandage to hold the IV in place against your skin. An IV pump may be used to control the flow of the IV infusion. During the infusion, your provider will check the area to make sure: There is no bleeding, swelling, or pain. Your IV infusion is flowing correctly. After the infusion, your provider will: Take off the bandage  or tape. Disconnect the tubing from the catheter. Remove the catheter, if you have a peripheral IV. Apply pressure over the IV insertion site to stop bleeding, then cover the area with a bandage. If you have an implanted port, PICC, non-tunneled, or tunneled catheter, your catheter may remain in place. This depends on how many times you will need treatment, your medical condition, and what type of catheter you have. These steps may vary. Ask what you can expect. What can I expect after the procedure? You may be  watched closely until you leave. This includes checking your pain level, blood pressure, heart rate, and breathing rate. Your provider will check to make sure there are no signs of infection. Follow these instructions at home: Take your medicines only as told. Change or take off your bandage as told by your provider. Ask what things are safe for you to do at home. Ask when you can go back to work or school. Do not take baths, swim, or use a hot tub until you're told it's OK. Ask if you can shower. Check your IV insertion site every day for signs of infection. Check for: Redness, swelling, or pain. Fluid or blood. If fluid or blood drains from your IV site, use your hands to press down firmly on the area for a minute or two. Doing this should stop the bleeding. Warmth. Pus or a bad smell. Contact a health care provider if: You have signs of infection around your IV site. You have fluid or blood coming from your IV site that does not stop after you put pressure to the site. You have a rash or blisters. You have itchy, red, swollen areas of skin called hives. Get help right away if: You have a fever or chills. You have chest pain. You have trouble breathing. This information is not intended to replace advice given to you by your health care provider. Make sure you discuss any questions you have with your health care provider. Document Revised: 06/05/2023 Document Reviewed: 06/05/2023 Elsevier Patient Education  2024 [employer].

## 2024-11-23 ENCOUNTER — Ambulatory Visit

## 2024-11-24 ENCOUNTER — Other Ambulatory Visit: Payer: Self-pay

## 2024-11-24 ENCOUNTER — Ambulatory Visit
Admission: RE | Admit: 2024-11-24 | Discharge: 2024-11-24 | Disposition: A | Source: Ambulatory Visit | Attending: Radiation Oncology

## 2024-11-24 DIAGNOSIS — C801 Malignant (primary) neoplasm, unspecified: Secondary | ICD-10-CM | POA: Insufficient documentation

## 2024-11-24 DIAGNOSIS — C7989 Secondary malignant neoplasm of other specified sites: Secondary | ICD-10-CM | POA: Diagnosis present

## 2024-11-24 DIAGNOSIS — Z87891 Personal history of nicotine dependence: Secondary | ICD-10-CM | POA: Insufficient documentation

## 2024-11-24 DIAGNOSIS — R110 Nausea: Secondary | ICD-10-CM | POA: Insufficient documentation

## 2024-11-24 DIAGNOSIS — Z510 Encounter for antineoplastic radiation therapy: Secondary | ICD-10-CM | POA: Insufficient documentation

## 2024-11-24 DIAGNOSIS — E860 Dehydration: Secondary | ICD-10-CM | POA: Diagnosis not present

## 2024-11-24 LAB — RAD ONC ARIA SESSION SUMMARY
Course Elapsed Days: 28
Plan Fractions Treated to Date: 20
Plan Prescribed Dose Per Fraction: 2 Gy
Plan Total Fractions Prescribed: 30
Plan Total Prescribed Dose: 60 Gy
Reference Point Dosage Given to Date: 40 Gy
Reference Point Session Dosage Given: 2 Gy
Session Number: 20

## 2024-11-25 ENCOUNTER — Other Ambulatory Visit: Payer: Self-pay

## 2024-11-25 ENCOUNTER — Inpatient Hospital Stay: Admitting: Dietician

## 2024-11-25 ENCOUNTER — Ambulatory Visit
Admission: RE | Admit: 2024-11-25 | Discharge: 2024-11-25 | Disposition: A | Source: Ambulatory Visit | Attending: Radiation Oncology

## 2024-11-25 ENCOUNTER — Inpatient Hospital Stay: Attending: Oncology

## 2024-11-25 VITALS — BP 146/62 | HR 73 | Temp 98.3°F | Resp 18 | Wt 191.8 lb

## 2024-11-25 DIAGNOSIS — C7989 Secondary malignant neoplasm of other specified sites: Secondary | ICD-10-CM

## 2024-11-25 DIAGNOSIS — Z510 Encounter for antineoplastic radiation therapy: Secondary | ICD-10-CM | POA: Diagnosis not present

## 2024-11-25 LAB — RAD ONC ARIA SESSION SUMMARY
Course Elapsed Days: 29
Plan Fractions Treated to Date: 21
Plan Prescribed Dose Per Fraction: 2 Gy
Plan Total Fractions Prescribed: 30
Plan Total Prescribed Dose: 60 Gy
Reference Point Dosage Given to Date: 42 Gy
Reference Point Session Dosage Given: 2 Gy
Session Number: 21

## 2024-11-25 MED ORDER — SODIUM CHLORIDE 0.9 % IV SOLN: Freq: Once | INTRAVENOUS | Status: AC

## 2024-11-25 NOTE — Progress Notes (Signed)
 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 She is receiving 30 fractions of radiation therapy. S/p b/L tonsillectomy 10/3.    Met with patient in infusion. She is receiving supportive therapy with IVF. Patient reports poor oral intake persists, but is trying. Patient unable to eat solids or anything that touches roof of mouth secondary to pain. Currently using MMW which has been helpful. She has been drinking egg nog, hot chocolate, chocolate milk. She has not tried KF or [employer] samples provided last week.   Medications: reviewed   Labs: no new labs  Anthropometrics: Wt 191.2 lb redia) on 12/1 - stable x7 days (suspect r/t supportive therapy)  11/24 - 192 lb  11/17 - 199.8 lb 11/10 - 204.6 lb  NUTRITION DIAGNOSIS: Food and nutrition related knowledge deficit improving    INTERVENTION:  Continue pushing high calorie high protein liquids  Support and encouragement   MONITORING, EVALUATION, GOAL: wt trends, intake   NEXT VISIT: Tuesday December 9 during fluids

## 2024-11-25 NOTE — Patient Instructions (Signed)

## 2024-11-26 ENCOUNTER — Ambulatory Visit
Admission: RE | Admit: 2024-11-26 | Discharge: 2024-11-26 | Disposition: A | Source: Ambulatory Visit | Attending: Radiation Oncology

## 2024-11-26 ENCOUNTER — Other Ambulatory Visit: Payer: Self-pay

## 2024-11-26 DIAGNOSIS — Z510 Encounter for antineoplastic radiation therapy: Secondary | ICD-10-CM | POA: Diagnosis not present

## 2024-11-26 DIAGNOSIS — L821 Other seborrheic keratosis: Secondary | ICD-10-CM | POA: Diagnosis not present

## 2024-11-26 DIAGNOSIS — D485 Neoplasm of uncertain behavior of skin: Secondary | ICD-10-CM | POA: Diagnosis not present

## 2024-11-26 DIAGNOSIS — D224 Melanocytic nevi of scalp and neck: Secondary | ICD-10-CM | POA: Diagnosis not present

## 2024-11-26 DIAGNOSIS — L720 Epidermal cyst: Secondary | ICD-10-CM | POA: Diagnosis not present

## 2024-11-26 LAB — RAD ONC ARIA SESSION SUMMARY
Course Elapsed Days: 30
Plan Fractions Treated to Date: 22
Plan Prescribed Dose Per Fraction: 2 Gy
Plan Total Fractions Prescribed: 30
Plan Total Prescribed Dose: 60 Gy
Reference Point Dosage Given to Date: 44 Gy
Reference Point Session Dosage Given: 2 Gy
Session Number: 22

## 2024-11-26 NOTE — Therapy (Signed)
 OUTPATIENT SPEECH LANGUAGE PATHOLOGY ONCOLOGY TREATMENT   Patient Name: Diane Mayo MRN: [id number] DOB:1951/01/17, 73 y.o., female Today's Date: 11/27/2024  PCP: Austin ROCKFORD , MD REFERRING PROVIDER: Izell Domino, MD  END OF SESSION:  End of Session - 11/27/24 1302     Visit Number 2    Number of Visits 3    Date for Recertification  01/28/25    SLP Start Time 1250    SLP Stop Time  1321    SLP Time Calculation (min) 31 min    Activity Tolerance Patient tolerated treatment well           Past Medical History:  Diagnosis Date   Anemia    Diabetes mellitus without complication (HCC)    Type 2   Family history of adverse reaction to anesthesia    mother slow to wake up   Genital herpes    GERD (gastroesophageal reflux disease)    Hypertension    Interstitial cystitis    Left bundle branch block (LBBB)    chronic   Leukocytosis    NASH (nonalcoholic steatohepatitis)    Osteoarthritis    Plantar fasciitis    Past Surgical History:  Procedure Laterality Date   BACK SURGERY     L3 L4   CARPAL TUNNEL RELEASE Bilateral    CESAREAN SECTION     twice 1978 and 1987   CHOLECYSTECTOMY     DILATION AND CURETTAGE OF UTERUS  1975   DIRECT LARYNGOSCOPY N/A 09/26/2024   Procedure: LARYNGOSCOPY, DIRECT;  Surgeon: Tobie Eldora NOVAK, MD;  Location: [facility] OR;  Service: ENT;  Laterality: N/A;   EYE SURGERY Bilateral 2021   cataracts removed   RADICAL NECK DISSECTION Right 09/26/2024   Procedure: DISSECTION, NECK, RADICAL;  Surgeon: Tobie Eldora NOVAK, MD;  Location: [facility] OR;  Service: ENT;  Laterality: Right;   SHOULDER SURGERY Right 2012   rotator cuff repair   TONSILLECTOMY Bilateral 09/26/2024   Procedure: TONSILLECTOMY;  Surgeon: Tobie Eldora NOVAK, MD;  Location: [facility] OR;  Service: ENT;  Laterality: Bilateral;   TUBAL LIGATION  1987   Patient Active Problem List   Diagnosis Date Noted   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) 08/27/2024    Hypertriglyceridemia 11/26/2023   Acute respiratory failure with hypoxia (HCC) 02/04/2022   Acute pulmonary edema (HCC)    Sepsis (HCC)    Spinal stenosis of lumbar region 02/01/2022   Essential hypertension 07/08/2019   Candidal vulvovaginitis 01/25/2019   Diabetes mellitus (HCC) 01/25/2019   Menopausal symptom 01/25/2019   Obesity 01/25/2019   LBBB (left bundle branch block) 01/10/2018   Acute cystitis with hematuria 12/10/2014   Burning with urination 11/11/2014   Frequency of urination 08/13/2013   Chronic interstitial cystitis 08/07/2012    ONSET DATE: See pertinent info below   REFERRING DIAG: C44.92,C79.89 (ICD-10-CM) - 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  Pt accompanied by: significant other- husband Butch  PERTINENT HISTORY:  SCC metastatic to right cervical lymph node, unknown primary, stage IVA (T0 N2 M0 p 16 +. She presented to her PCP in late January of 2025 with concerns over a mass on her right neck. She underwent a head and neck US  on 03/18/24 showing a 4.5 cm complex cystic  mass in or adjacent to the right parotid gland. To further investigate her symptoms she then underwent a neck CT on 04/09/24 showing a  right level 2A there is a predominantly cystic lesion measuring up to 3.2 cm in greatest axial dimension. 07/16/24 She saw Dr. Tobie for a consult regarding her mass. She then underwent a fine needle aspiration biopsy of right parotid with pathology indicating squamous cell carcinoma. Immunohistochemical stain for p16 was performed. The stain show increased staining in the basal layer of the squamous epithelium but overall it is patchy. There is no definitive evidence of block like pattern of staining. The finding does not support a HPV  related squamous cell carcinoma... The possibility of squamous cell carcinoma arising from a branchial cleft cyst is considered. However, a metastatic squamous cell carcinoma cannot be excluded. 08/15/24 PET showed a right-sided neck mass demonstrates low level heterogeneous peripheral FDG accumulation with SUV of 4.3 with no other hypermetabolic lymphadenopathy in the neck. No evidence for hypermetabolic metastatic disease in the chest, abdomen, or pelvis. 08/14/24 follow up with Dr. Tobie the patient opted to proceed with bilateral tonsillectomy, radical neck dissection, and directed laryngoscopy with biopsies on 09/26/24 (delayed due to a grandchild's wedding). 08/27/24 Radiation consult and 10/03/24 medical oncology consult. Once 09/26/24 biopsy results returned radiation only was recommended. Treatment plan: She will receive 30 fractions of radiation to her Oropharynx and bilateral neck which started on 10/27/24 and complete 12/08/24.  PAIN:  Are you having pain? No, but throat pain increases to 5/10 with swallowing  FALLS: Has patient fallen in last 6 months?  No   PATIENT GOALS: Maintain WNL swallowing  OBJECTIVE:  Note: Objective measures were completed at Evaluation unless otherwise noted.                                                                                                                  TREATMENT DATE:  11/27/24:Pt drank water and had teaspoons of ice cream/frosty without any s/s oropharyngeal dysphagia. Pt used a looking ahead (not turned to one side) posture with these boluses. Pt tries bites of solids regularly but not more than a few bites, denies s/s oropharyngeal dysphagia with these bites. SLP encouraged pt to use pain meds prescribed by Dr. Izell if she needs them for POs, as this is the main reason her PO intake is not optimal at this time. Pt has completed HEP with suboptimal frequency and scope, SLP reiterated to complete as much as possible currently, and as she cont to  heal after completion of radiation, to increase reps of all exercises to at least 20/day (except Shaker - 6/day). SLP encouraged pt to also perform chin tuck exercise (#5) and SLP showed pt this exercise and she return demonstrated correctly. She told SLP rationale for HEP, and after SLP explanation of food journal, told SLP how it would be beneficial for her.   10/30/24: Research states the risk for dysphagia increases due to radiation and/or chemotherapy treatment due to a variety of factors, so  SLP educated the pt about the possibility of reduced/limited ability for PO intake during rad tx. SLP also educated pt regarding possible changes to swallowing musculature after rad tx, and why adherence to dysphagia HEP provided today and PO consumption was necessary to inhibit muscle fibrosis following rad tx and to mitigate muscle disuse atrophy. SLP informed pt why this would be detrimental to their swallowing status and to their pulmonary health. Pt demonstrated understanding of these things to SLP. SLP encouraged pt to safely eat and drink as deep into their radiation/chemotherapy as possible to provide the best possible long-term swallowing outcome for pt.  SLP then developed an individualized HEP for pt involving oral and pharyngeal strengthening and ROM and pt was instructed how to perform these exercises, including SLP demonstration. After SLP demonstration, pt return demonstrated each exercise. SLP ensured pt performance was correct prior to educating pt on next exercise. Pt required occasional min-mod cues faded to modified independent to perform HEP. Pt was instructed to complete this program 5-7 days/week, at least 20 reps a day until 6 months after his or her last day of rad tx, and then x2 a week after that, indefinitely. Among other modifications for days when pt cannot functionally swallow, SLP also suggested pt to perform only non-swallowing tasks on the handout/HEP, and if necessary to cycle through  the swallowing portion so the full program of exercises can be completed instead of fatiguing on one of the swallowing exercises and being unable to perform the other swallowing exercises. SLP instructed that swallowing exercises should then be added back into the regimen as pt is able to do so.    PATIENT EDUCATION: Education details: late effects head/neck radiation on swallow function, HEP procedure, and modification to HEP when difficulty experienced with swallowing during and after radiation course Person educated: Patient and Spouse Education method: Explanation, Demonstration, and Verbal cues Education comprehension: verbalized understanding, returned demonstration, verbal cues required, and needs further education   ASSESSMENT:  CLINICAL IMPRESSION: Patient is a 73 y.o. F who was seen today for treatment of swallowing as they undergo radiation therapy. Today pt drank thin liquids and had bites of ice cream/frosty without overt s/s oropharyngeal difficulty. Pt reported pharyngeal residue with ham and this was cleared with head turn rt with water. At this time pt swallowing is deemed WNL/WFL with these POs. No overt s/sx aspiration were observed. There are no overt s/s aspiration PNA observed by SLP nor any reported by pt at this time. Data indicate that pt's swallow ability will likely decrease over the course of radiation/chemoradiation therapy and could very well decline over time following the conclusion of that therapy due to muscle disuse atrophy and/or muscle fibrosis. Pt will cont to need to be seen by SLP in order to assess safety of PO intake, assess the need for recommending any objective swallow assessment, and ensuring pt is correctly completing the individualized HEP.  OBJECTIVE IMPAIRMENTS: include dysphagia. These impairments are limiting patient from safety when swallowing. Factors affecting potential to achieve goals and functional outcome are none noted today. Patient will  benefit from skilled SLP services to address above impairments and improve overall function.   REHAB POTENTIAL: Good     GOALS: Goals reviewed with patient? No   SHORT TERM GOALS: Target: 3rd total session   1. Pt will complete HEP with modified independence in 2 sessions Baseline:11/27/24 Goal status: INITIAL   2.  pt will tell SLP why pt is completing HEP with modified independence Baseline:  Goal status: met   3.  pt will describe 3 overt s/s aspiration PNA with modified independence Baseline:  Goal status: INITIAL   4.  pt will tell SLP how a food journal could hasten return to a more normalized diet Baseline:  Goal status: met     LONG TERM GOALS: Target: 7th total session   1.  pt will complete HEP with independence over two visits Baseline:  Goal status: INITIAL   2.  pt will describe how to modify HEP over time, and the timeline associated with reduction in HEP frequency with modified independence over two sessions Baseline:  Goal status: INITIAL     PLAN:   SLP FREQUENCY:  once approx every 4 weeks   SLP DURATION:  7 sessions   PLANNED INTERVENTIONS: Aspiration precaution training, Pharyngeal strengthening exercises, Diet toleration management , Trials of upgraded texture/liquids, SLP instruction and feedback, Compensatory strategies, and Patient/family education, [phone] (treatment of swallowing dysfunction and/or oral function for feeding)    Rosina Cressler, CCC-SLP 11/27/2024, 2:25 PM

## 2024-11-27 ENCOUNTER — Ambulatory Visit: Attending: Radiation Oncology

## 2024-11-27 ENCOUNTER — Inpatient Hospital Stay: Admitting: Nutrition

## 2024-11-27 ENCOUNTER — Ambulatory Visit
Admission: RE | Admit: 2024-11-27 | Discharge: 2024-11-27 | Disposition: A | Source: Ambulatory Visit | Attending: Radiation Oncology

## 2024-11-27 ENCOUNTER — Other Ambulatory Visit: Payer: Self-pay

## 2024-11-27 DIAGNOSIS — Z510 Encounter for antineoplastic radiation therapy: Secondary | ICD-10-CM | POA: Diagnosis not present

## 2024-11-27 DIAGNOSIS — R1313 Dysphagia, pharyngeal phase: Secondary | ICD-10-CM | POA: Insufficient documentation

## 2024-11-27 LAB — RAD ONC ARIA SESSION SUMMARY
Course Elapsed Days: 31
Plan Fractions Treated to Date: 23
Plan Prescribed Dose Per Fraction: 2 Gy
Plan Total Fractions Prescribed: 30
Plan Total Prescribed Dose: 60 Gy
Reference Point Dosage Given to Date: 46 Gy
Reference Point Session Dosage Given: 2 Gy
Session Number: 23

## 2024-11-28 ENCOUNTER — Ambulatory Visit
Admission: RE | Admit: 2024-11-28 | Discharge: 2024-11-28 | Disposition: A | Source: Ambulatory Visit | Attending: Radiation Oncology

## 2024-11-28 ENCOUNTER — Other Ambulatory Visit: Payer: Self-pay

## 2024-11-28 DIAGNOSIS — Z510 Encounter for antineoplastic radiation therapy: Secondary | ICD-10-CM | POA: Diagnosis not present

## 2024-11-28 LAB — RAD ONC ARIA SESSION SUMMARY
Course Elapsed Days: 32
Plan Fractions Treated to Date: 24
Plan Prescribed Dose Per Fraction: 2 Gy
Plan Total Fractions Prescribed: 30
Plan Total Prescribed Dose: 60 Gy
Reference Point Dosage Given to Date: 48 Gy
Reference Point Session Dosage Given: 2 Gy
Session Number: 24

## 2024-11-29 ENCOUNTER — Inpatient Hospital Stay

## 2024-11-29 VITALS — BP 123/49 | HR 69 | Temp 97.2°F | Resp 17

## 2024-11-29 DIAGNOSIS — Z510 Encounter for antineoplastic radiation therapy: Secondary | ICD-10-CM | POA: Diagnosis not present

## 2024-11-29 DIAGNOSIS — C7989 Secondary malignant neoplasm of other specified sites: Secondary | ICD-10-CM

## 2024-11-29 MED ORDER — SODIUM CHLORIDE 0.9 % IV SOLN: Freq: Once | INTRAVENOUS | Status: AC

## 2024-12-01 ENCOUNTER — Ambulatory Visit: Admission: RE | Admit: 2024-12-01 | Discharge: 2024-12-01 | Attending: Radiation Oncology

## 2024-12-01 ENCOUNTER — Other Ambulatory Visit: Payer: Self-pay | Admitting: Radiation Oncology

## 2024-12-01 ENCOUNTER — Ambulatory Visit
Admission: RE | Admit: 2024-12-01 | Discharge: 2024-12-01 | Disposition: A | Source: Ambulatory Visit | Attending: Radiation Oncology

## 2024-12-01 ENCOUNTER — Other Ambulatory Visit: Payer: Self-pay

## 2024-12-01 DIAGNOSIS — C4492 Squamous cell carcinoma of skin, unspecified: Secondary | ICD-10-CM

## 2024-12-01 DIAGNOSIS — Z510 Encounter for antineoplastic radiation therapy: Secondary | ICD-10-CM | POA: Diagnosis not present

## 2024-12-01 LAB — RAD ONC ARIA SESSION SUMMARY
Course Elapsed Days: 35
Plan Fractions Treated to Date: 25
Plan Prescribed Dose Per Fraction: 2 Gy
Plan Total Fractions Prescribed: 30
Plan Total Prescribed Dose: 60 Gy
Reference Point Dosage Given to Date: 50 Gy
Reference Point Session Dosage Given: 2 Gy
Session Number: 25

## 2024-12-01 MED ORDER — HYDROCODONE-ACETAMINOPHEN 5-325 MG PO TABS: 1.0000 | ORAL_TABLET | ORAL | 0 refills | Status: DC | PRN

## 2024-12-02 ENCOUNTER — Ambulatory Visit
Admission: RE | Admit: 2024-12-02 | Discharge: 2024-12-02 | Disposition: A | Source: Ambulatory Visit | Attending: Radiation Oncology

## 2024-12-02 ENCOUNTER — Other Ambulatory Visit: Payer: Self-pay

## 2024-12-02 ENCOUNTER — Inpatient Hospital Stay: Admitting: Dietician

## 2024-12-02 ENCOUNTER — Inpatient Hospital Stay

## 2024-12-02 VITALS — BP 129/62 | HR 75 | Temp 98.7°F | Resp 16

## 2024-12-02 DIAGNOSIS — C7989 Secondary malignant neoplasm of other specified sites: Secondary | ICD-10-CM

## 2024-12-02 DIAGNOSIS — Z510 Encounter for antineoplastic radiation therapy: Secondary | ICD-10-CM | POA: Diagnosis not present

## 2024-12-02 LAB — RAD ONC ARIA SESSION SUMMARY
Course Elapsed Days: 36
Plan Fractions Treated to Date: 26
Plan Prescribed Dose Per Fraction: 2 Gy
Plan Total Fractions Prescribed: 30
Plan Total Prescribed Dose: 60 Gy
Reference Point Dosage Given to Date: 52 Gy
Reference Point Session Dosage Given: 2 Gy
Session Number: 26

## 2024-12-02 MED ORDER — SODIUM CHLORIDE 0.9 % IV SOLN: Freq: Once | INTRAVENOUS | Status: AC

## 2024-12-02 NOTE — Patient Instructions (Signed)
Rehydration  Rehydration is the replacement of fluids, salts, and minerals in the body (electrolytes) that are lost during dehydration. Dehydration is when there is not enough water or other fluids in the body. This happens when you lose more fluids than you take in. People who are age 72 or older have a higher risk of dehydration than younger adults. This is because in older age, the body: Is less able to maintain the right amount of water. Does not respond to temperature changes as well. Does not get a sense of thirst as easily or quickly. Other causes include: Not drinking enough fluids. This can occur when you are ill, when you forget to drink, or when you are doing activities that require a lot of energy, especially in hot weather. Conditions that cause loss of water or other fluids. These include diarrhea, vomiting, sweating, or urinating a lot. Other illnesses, such as fever or infection. Certain medicines, such as those that remove excess fluid from the body (diuretics). Symptoms of mild or moderate dehydration may include thirst, dry lips and mouth, and dizziness. Symptoms of severe dehydration may include increased heart rate, confusion, fainting, and not urinating. In severe cases, you may need to get fluids through an IV at the hospital. For mild or moderate cases, you can usually rehydrate at home by drinking certain fluids as told by your health care provider. What are the risks? Rehydration is usually safe. Taking in too much fluid (overhydration) can be a problem but is rare. Overhydration can cause an imbalance of electrolytes in the body, kidney failure, fluid in the lungs, or a decrease in salt (sodium) levels in the body. Supplies needed: You will need an oral rehydration solution (ORS) if your health care provider tells you to use one. This is a drink to treat dehydration. It can be found in pharmacies and retail stores. How to rehydrate Fluids Follow instructions from your  health care provider about what to drink. The kind of fluid and the amount you should drink depend on your condition. In general, you should choose drinks that you prefer. If told by your health care provider, drink an ORS. Make an ORS by following instructions on the package. Start by drinking small amounts, about  cup (120 mL) every 5-10 minutes. Slowly increase how much you drink until you have taken in the amount recommended by your health care provider. Drink enough clear fluids to keep your urine pale yellow. If you were told to drink an ORS, finish it first, then start slowly drinking other clear fluids. Drink fluids such as: Water. This includes sparkling and flavored water. Drinking only water can lead to having too little sodium in your body (hyponatremia). Follow the advice of your health care provider. Water from ice chips you suck on. Fruit juice with water added to it(diluted). Sports drinks. Hot or cold herbal teas. Broth-based soups. Coffee. Milk or milk products. Food Follow instructions from your health care provider about what to eat while you rehydrate. Your health care provider may recommend that you slowly begin eating regular foods in small amounts. Eat foods that contain a healthy balance of electrolytes, such as bananas, oranges, potatoes, tomatoes, and spinach. Avoid foods that are greasy or contain a lot of sugar. In some cases, you may get nutrition through a feeding tube that is passed through your nose and into your stomach (nasogastric tube, or NG tube). This may be done if you have uncontrolled vomiting or diarrhea. Drinks to avoid  Certain  drinks may make dehydration worse. While you rehydrate, avoid drinking alcohol. How to tell if you are recovering from dehydration You may be getting better if: You are urinating more often than before you started rehydrating. Your urine is pale yellow. Your energy level improves. You vomit less often. You have diarrhea  less often. Your appetite improves or returns to normal. You feel less dizzy or light-headed. Your skin tone and color start to look more normal. Follow these instructions at home: Take over-the-counter and prescription medicines only as told by your health care provider. Do not take sodium tablets. Doing this can lead to having too much sodium in your body (hypernatremia). Contact a health care provider if: You continue to have symptoms of mild or moderate dehydration, such as: Thirst. Dry lips. Slightly dry mouth. Dizziness. Dark urine or less urine than usual. Muscle cramps. You continue to vomit or have diarrhea. Get help right away if: You have symptoms of dehydration that get worse. You have a fever. You have a severe headache. You have been vomiting and have problems, such as: Your vomiting gets worse. Your vomit includes blood or green matter (bile). You cannot eat or drink without vomiting. You have problems with urination or bowel movements, such as: Diarrhea that gets worse. Blood in your stool (feces). This may cause stool to look black and tarry. Not urinating, or urinating only a small amount of very dark urine, within 6-8 hours. You have trouble breathing. You have symptoms that get worse with treatment. These symptoms may be an emergency. Get help right away. Call 911. Do not wait to see if the symptoms will go away. Do not drive yourself to the hospital. This information is not intended to replace advice given to you by your health care provider. Make sure you discuss any questions you have with your health care provider. Document Revised: 04/26/2022 Document Reviewed: 04/24/2022 Elsevier Patient Education  2024 [employer].

## 2024-12-02 NOTE — Progress Notes (Signed)
 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 She is receiving 30 fractions of radiation therapy. S/p b/L tonsillectomy 10/3. Final radiation planned 12/15  Met with patient and husband in infusion. She is receiving supportive IV fluids. Patient reports being able to tolerate more po with increased pain medication. Husband says patient had not been using hydrocodone  prior to eating. Yesterday had 2 spoonfuls of cottage cheese, one CIB, big glass of tea, and a stick of celery. Patient said she chewed the celery to strings and then spit out. Today she has had cup of hot tea, 4 oz of egg nog, and 4 oz of water. Planning to try pimento cheese this evening.    Medications: reviewed   Labs: no new labs for review   Anthropometrics: Wt 189.4 lb today - continues to trend down  12/1 - 191.2 lb  11/24 - 192 lb 11/17 - 199.8 lb  11/10 - 204.6 lb    NUTRITION DIAGNOSIS: Food and nutrition related knowledge deficit    INTERVENTION:  Encourage taking pain medications as prescribed q4h - try po after each dose Encourage high calorie high protein liquids Supportive IVF per MD    MONITORING, EVALUATION, GOAL: weight trends, intake   NEXT VISIT: To be scheduled with post treatment f/u as able

## 2024-12-03 ENCOUNTER — Other Ambulatory Visit: Payer: Self-pay

## 2024-12-03 ENCOUNTER — Ambulatory Visit
Admission: RE | Admit: 2024-12-03 | Discharge: 2024-12-03 | Disposition: A | Discharge status: Home / Self Care | Source: Ambulatory Visit | Attending: Radiation Oncology | Admitting: Radiation Oncology

## 2024-12-03 DIAGNOSIS — Z510 Encounter for antineoplastic radiation therapy: Secondary | ICD-10-CM | POA: Diagnosis not present

## 2024-12-03 LAB — RAD ONC ARIA SESSION SUMMARY
Course Elapsed Days: 37
Plan Fractions Treated to Date: 27
Plan Prescribed Dose Per Fraction: 2 Gy
Plan Total Fractions Prescribed: 30
Plan Total Prescribed Dose: 60 Gy
Reference Point Dosage Given to Date: 54 Gy
Reference Point Session Dosage Given: 2 Gy
Session Number: 27

## 2024-12-04 ENCOUNTER — Other Ambulatory Visit: Payer: Self-pay

## 2024-12-04 ENCOUNTER — Inpatient Hospital Stay: Admitting: Dietician

## 2024-12-04 ENCOUNTER — Ambulatory Visit
Admission: RE | Admit: 2024-12-04 | Discharge: 2024-12-04 | Disposition: A | Discharge status: Home / Self Care | Source: Ambulatory Visit | Attending: Radiation Oncology | Admitting: Radiation Oncology

## 2024-12-04 DIAGNOSIS — Z510 Encounter for antineoplastic radiation therapy: Secondary | ICD-10-CM | POA: Diagnosis not present

## 2024-12-04 LAB — RAD ONC ARIA SESSION SUMMARY
Course Elapsed Days: 38
Plan Fractions Treated to Date: 28
Plan Prescribed Dose Per Fraction: 2 Gy
Plan Total Fractions Prescribed: 30
Plan Total Prescribed Dose: 60 Gy
Reference Point Dosage Given to Date: 56 Gy
Reference Point Session Dosage Given: 2 Gy
Session Number: 28

## 2024-12-05 ENCOUNTER — Other Ambulatory Visit: Payer: Self-pay

## 2024-12-05 ENCOUNTER — Ambulatory Visit
Admission: RE | Admit: 2024-12-05 | Discharge: 2024-12-05 | Disposition: A | Source: Ambulatory Visit | Attending: Radiation Oncology

## 2024-12-05 DIAGNOSIS — Z510 Encounter for antineoplastic radiation therapy: Secondary | ICD-10-CM | POA: Diagnosis not present

## 2024-12-05 LAB — RAD ONC ARIA SESSION SUMMARY
Course Elapsed Days: 39
Plan Fractions Treated to Date: 29
Plan Prescribed Dose Per Fraction: 2 Gy
Plan Total Fractions Prescribed: 30
Plan Total Prescribed Dose: 60 Gy
Reference Point Dosage Given to Date: 58 Gy
Reference Point Session Dosage Given: 2 Gy
Session Number: 29

## 2024-12-06 ENCOUNTER — Inpatient Hospital Stay

## 2024-12-06 VITALS — BP 138/55 | HR 72 | Temp 97.6°F | Resp 18 | Ht 60.0 in

## 2024-12-06 DIAGNOSIS — C7989 Secondary malignant neoplasm of other specified sites: Secondary | ICD-10-CM

## 2024-12-06 DIAGNOSIS — Z510 Encounter for antineoplastic radiation therapy: Secondary | ICD-10-CM | POA: Diagnosis not present

## 2024-12-06 MED ORDER — SODIUM CHLORIDE 0.9 % IV SOLN: Freq: Once | INTRAVENOUS | Status: AC

## 2024-12-08 ENCOUNTER — Other Ambulatory Visit: Payer: Self-pay

## 2024-12-08 ENCOUNTER — Ambulatory Visit
Admission: RE | Admit: 2024-12-08 | Discharge: 2024-12-08 | Disposition: A | Source: Ambulatory Visit | Attending: Radiation Oncology

## 2024-12-08 ENCOUNTER — Other Ambulatory Visit: Payer: Self-pay | Admitting: Radiation Oncology

## 2024-12-08 ENCOUNTER — Ambulatory Visit: Admission: RE | Admit: 2024-12-08 | Discharge: 2024-12-08 | Attending: Radiation Oncology

## 2024-12-08 DIAGNOSIS — C7989 Secondary malignant neoplasm of other specified sites: Secondary | ICD-10-CM

## 2024-12-08 DIAGNOSIS — Z510 Encounter for antineoplastic radiation therapy: Secondary | ICD-10-CM | POA: Diagnosis not present

## 2024-12-08 LAB — RAD ONC ARIA SESSION SUMMARY
Course Elapsed Days: 42
Plan Fractions Treated to Date: 30
Plan Prescribed Dose Per Fraction: 2 Gy
Plan Total Fractions Prescribed: 30
Plan Total Prescribed Dose: 60 Gy
Reference Point Dosage Given to Date: 60 Gy
Reference Point Session Dosage Given: 2 Gy
Session Number: 30

## 2024-12-08 MED ORDER — FLUCONAZOLE 100 MG PO TABS: ORAL_TABLET | ORAL | 0 refills | Status: DC

## 2024-12-08 MED ORDER — SCOPOLAMINE 1 MG/3DAYS TD PT72: 1.0000 | MEDICATED_PATCH | TRANSDERMAL | 1 refills | Status: DC

## 2024-12-08 NOTE — Progress Notes (Signed)
 Oncology Nurse Navigator Documentation   Met with Ms.Haik after final RT to offer support and to celebrate end of radiation treatment.   Provided verbal post-RT guidance: Importance of keeping all follow-up appts, especially those with Nutrition and SLP. Importance of protecting treatment area from sun. Continuation of Sonafine application 2-3 times daily, application of antibiotic ointment to areas of raw skin; when supply of Sonafine exhausted transition to OTC lotion with vitamin E. Provided/reviewed Epic calendar of upcoming appts. Explained my role as navigator will continue for several more months, encouraged her to call me with needs/concerns.    [name] RN, BSN, OCN Head & Neck Oncology Nurse Navigator [facility] Phone # [phone]  Fax # [phone]

## 2024-12-09 ENCOUNTER — Inpatient Hospital Stay

## 2024-12-09 ENCOUNTER — Ambulatory Visit

## 2024-12-09 VITALS — BP 140/49 | HR 66 | Temp 98.5°F | Resp 16

## 2024-12-09 DIAGNOSIS — Z510 Encounter for antineoplastic radiation therapy: Secondary | ICD-10-CM | POA: Diagnosis not present

## 2024-12-09 DIAGNOSIS — C7989 Secondary malignant neoplasm of other specified sites: Secondary | ICD-10-CM

## 2024-12-09 MED ORDER — SODIUM CHLORIDE 0.9 % IV SOLN: Freq: Once | INTRAVENOUS | Status: AC

## 2024-12-09 NOTE — Radiation Completion Notes (Signed)
 Patient Name: Diane Mayo, Diane Mayo MRN: [id number] Date of Birth: [birth year] Referring Physician: ELDORA BLANCH, M.D. Date of Service: 2024-12-09 Radiation Oncologist: Lauraine Golden, M.D. 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) T=cT0, N=cN2a, M=cM0 Intent: Curative     ==========DELIVERED PLANS==========  First Treatment Date: 2024-10-27 Last Treatment Date: 2024-12-08   Plan Name: HN_Oroph Site: Oropharynx Technique: IMRT Mode: Photon Dose Per Fraction: 2 Gy Prescribed Dose (Delivered / Prescribed): 60 Gy / 60 Gy Prescribed Fxs (Delivered / Prescribed): 30 / 30     ==========ON TREATMENT VISIT DATES========== 2024-10-27, 2024-11-03, 2024-11-10, 2024-11-17, 2024-11-24, 2024-12-01, 2024-12-08     ==========UPCOMING VISITS========== 01/01/2025 [facility] NE[facility]O NEURO ST TREATMENT Jacelyn Lupita NOVAK, CCC-SLP  01/01/2025 [facility] RE-EVALUATION Breedlove Blue, Blaire L, PT  12/27/2024 [facility]ION 2HR30MIN (150) [facility]  12/23/2024 [facility] INFUSION 2HR30MIN (150) [facility]  12/20/2024 [facility] INFUSION 2HR30MIN (150) [facility]  12/16/2024 [facility] FOLLOW UP 30 Wyatt Leeroy HERO, [place]  12/16/2024 [facility] INFUSION 2HR30MIN (150) [facility]  12/13/2024 [facility] INFUSION 2HR30MIN (150) [facility]  12/09/2024 [facility] INFUSION 2HR30MIN (150) [facility]        ==========APPENDIX - ON TREATMENT VISIT NOTES==========   See weekly On Treatment Notes in Epic for details in the Media tab (listed as Progress notes on the On Treatment Visit Dates  listed above).

## 2024-12-09 NOTE — Patient Instructions (Signed)

## 2024-12-12 NOTE — Progress Notes (Signed)
 " Radiation Oncology         [phone] ________________________________  Name: Diane Mayo MRN: [id number]  Date: 12/16/2024  DOB: Sep 20, 1951  Follow-Up Visit Note  CC: Sun, Vyvyan, MD  Sun, Vyvyan, MD  Diagnosis and Prior Radiotherapy:    No diagnosis found. 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 [facility]) Staging form: Cervical Lymph Nodes and Unknown Primary Tumors of the Head and Neck, AJCC 8th Edition - Clinical stage from 08/27/2024: Stage IVA (cT0, cN2a, cM0) - Signed by Autumn Millman, MD on 10/03/2024 Stage prefix: Initial diagnosis   ==========DELIVERED PLANS==========  First Treatment Date: 2024-10-27 Last Treatment Date: 2024-12-08   Plan Name: HN_Oroph Site: Oropharynx Technique: IMRT Mode: Photon Dose Per Fraction: 2 Gy Prescribed Dose (Delivered / Prescribed): 60 Gy / 60 Gy Prescribed Fxs (Delivered / Prescribed): 30 / 30  Stage IVA (cT0, N2a, M0) squamous cell carcinoma of unknown primary with metastasis to right cervical lymph node; s/p definitive radiation completed on 12/08/2024   CHIEF COMPLAINT:  Here for follow-up and surveillance of oropharyngeal cancer  Narrative:  The patient returns today for routine follow-up. She completed her treatment approximately 1 week ago.   ***                    ALLERGIES:  is allergic to penicillins, accupril [quinapril hcl], januvia [sitagliptin], monistat [miconazole], tioconazole, and zestril [lisinopril].  Meds: Current Outpatient Medications  Medication Sig Dispense Refill   acetaminophen  (TYLENOL ) 500 MG tablet Take 2 tablets (1,000 mg total) by mouth every 6 (six) hours. 100 tablet 0   allopurinol  (ZYLOPRIM ) 300 MG tablet Take 300 mg by mouth in the morning.     amLODipine  (NORVASC ) 10 MG tablet Take 10 mg by mouth in the morning.     bacitracin  ointment Apply to neck incision twice daily for 7 days 30 g 0   cetirizine (ZYRTEC) 10 MG tablet Take 10  mg by mouth in the morning.     ciprofloxacin (CIPRO) 500 MG tablet Take 500 mg by mouth 2 (two) times daily. for 5 days     colchicine  0.6 MG tablet TAKE 1 TABLET BY MOUTH TWICE A DAY (Patient taking differently: Take 0.6 mg by mouth 2 (two) times daily as needed (gout).) 30 tablet 0   fenofibrate  (TRICOR ) 48 MG tablet TAKE 1 TABLET BY MOUTH DAILY 90 tablet 1   fluconazole  (DIFLUCAN ) 100 MG tablet Take 2 tablets today, then 1 tablet daily x 20 more days.  HOLD CRESTOR  AND COLCHICINE  WHILE ON THIS 22 tablet 0   hydrochlorothiazide  (MICROZIDE ) 12.5 MG capsule Take 12.5 mg by mouth in the morning.     HYDROcodone -acetaminophen  (NORCO/VICODIN) 5-325 MG tablet Take 1-2 tablets by mouth every 4 (four) hours as needed for moderate pain (pain score 4-6) (take with food). Do not exceed 4000mg  of acetaminophen  daily. 75 tablet 0   icosapent  Ethyl (VASCEPA ) 1 g capsule Take 2 capsules (2 g total) by mouth 2 (two) times daily. 120 capsule 3   irbesartan  (AVAPRO ) 300 MG tablet TAKE 1 TABLET BY MOUTH DAILY 90 tablet 3   LORazepam  (ATIVAN ) 0.5 MG tablet Take 1 tablet (0.5 mg total) by mouth every 8 (eight) hours as needed. 60 tablet 0   magic mouthwash (nystatin , lidocaine , diphenhydrAMINE, alum & mag hydroxide) suspension Swish and spit 15 mLs 4 (four) times daily as needed for mouth pain. 180 mL 3   metFORMIN  (GLUCOPHAGE )  1000 MG tablet Take 1,000 mg by mouth 2 (two) times daily with a meal.     metoprolol  tartrate (LOPRESSOR ) 25 MG tablet Take 75 mg by mouth 2 (two) times daily.     nystatin  cream (MYCOSTATIN ) Apply 1 application topically daily as needed for rash.     ondansetron  (ZOFRAN -ODT) 4 MG disintegrating tablet Take 4 mg by mouth every 8 (eight) hours as needed for nausea or vomiting.     pantoprazole  (PROTONIX ) 40 MG tablet Take 40 mg by mouth 2 (two) times daily.     Pentosan Polysulfate Sodium  200 MG CPDR Take 200 mg by mouth 2 (two) times daily.     polyvinyl alcohol  (LIQUIFILM TEARS) 1.4 %  ophthalmic solution Place 1 drop into both eyes as needed for dry eyes.     PREVIDENT 5000 SENSITIVE 1.1-5 % GEL APPLY 5 GRAMS TO THE TEETH AT BEDTIME. USE WITH FLUORIDE TRAYS AT NIGHT. KEEP IT IN FOR 20 MINUTES     promethazine  (PHENERGAN ) 25 MG tablet Take 1 tablet (25 mg total) by mouth every 6 (six) hours as needed for nausea or vomiting. 60 tablet 5   rosuvastatin  (CRESTOR ) 10 MG tablet TAKE 1 TABLET BY MOUTH DAILY 90 tablet 1   scopolamine  (TRANSDERM-SCOP) 1 MG/3DAYS Place 1 patch (1 mg total) onto the skin every 3 (three) days. 10 patch 1   Sod Fluoride-Potassium Nitrate 1.1-4.5 % GEL Place 5 g onto teeth.     trimethoprim  (TRIMPEX ) 100 MG tablet Take 100 mg by mouth every evening.     valACYclovir  (VALTREX ) 500 MG tablet Take 500 mg by mouth daily.     No current facility-administered medications for this visit.    Physical Findings: The patient is in no acute distress. Patient is alert and oriented. Wt Readings from Last 3 Encounters:  11/25/24 191 lb 12 oz (87 kg)  11/18/24 195 lb (88.5 kg)  10/08/24 (P) 205 lb 6 oz (93.2 kg)    vitals were not taken for this visit. .  General: Alert and oriented, in no acute distress HEENT: Head is normocephalic. Extraocular movements are intact. Oropharynx is notable for *** Neck: Neck is notable for *** Skin: Skin in treatment fields shows satisfactory healing *** Heart: Regular in rate and rhythm with no murmurs, rubs, or gallops. Chest: Clear to auscultation bilaterally, with no rhonchi, wheezes, or rales. Abdomen: Soft, nontender, nondistended, with no rigidity or guarding. Extremities: No cyanosis or edema. Lymphatics: see Neck Exam Psychiatric: Judgment and insight are intact. Affect is appropriate.   Lab Findings: Lab Results  Component Value Date   WBC 9.9 10/03/2024   HGB 10.6 (L) 10/03/2024   HCT 32.6 (L) 10/03/2024   MCV 82.5 10/03/2024   PLT 290 10/03/2024    Lab Results  Component Value Date   TSH 2.000 10/03/2024     Radiographic Findings: No results found.  Impression/Plan:    1) Head and Neck Cancer Status:  Stage IVA (cT0, N2a, M0) squamous cell carcinoma of unknown primary with metastasis to right cervical lymph node; s/p definitive radiation completed on 12/08/2024  2) Nutritional Status: *** PEG tube: ***  3) Risk Factors: The patient has been educated about risk factors including alcohol  and tobacco abuse; they understand that avoidance of alcohol  and tobacco is important to prevent recurrences as well as other cancers  4) Swallowing: ***  5) Dental: Encouraged to continue regular followup with dentistry, and dental hygiene including fluoride rinses. ***  6) Thyroid  function:  Lab Results  Component Value Date   TSH 2.000 10/03/2024    7) Other: ***  8) Follow-up in *** months. The patient was encouraged to call with any issues or questions before then.  On date of service, in total, I spent *** minutes on this encounter. Patient was seen in person. _____________________________________   Lauraine Golden, MD  "

## 2024-12-13 ENCOUNTER — Emergency Department (HOSPITAL_COMMUNITY)

## 2024-12-13 ENCOUNTER — Emergency Department (HOSPITAL_COMMUNITY)
Admission: EM | Admit: 2024-12-13 | Discharge: 2024-12-13 | Disposition: A | Discharge status: Home / Self Care | Attending: Emergency Medicine | Admitting: Emergency Medicine

## 2024-12-13 ENCOUNTER — Inpatient Hospital Stay

## 2024-12-13 ENCOUNTER — Other Ambulatory Visit: Payer: Self-pay

## 2024-12-13 VITALS — BP 154/70 | HR 80 | Temp 99.2°F | Resp 16

## 2024-12-13 DIAGNOSIS — Z79899 Other long term (current) drug therapy: Secondary | ICD-10-CM | POA: Insufficient documentation

## 2024-12-13 DIAGNOSIS — I10 Essential (primary) hypertension: Secondary | ICD-10-CM | POA: Insufficient documentation

## 2024-12-13 DIAGNOSIS — Z510 Encounter for antineoplastic radiation therapy: Secondary | ICD-10-CM | POA: Diagnosis not present

## 2024-12-13 DIAGNOSIS — Z7984 Long term (current) use of oral hypoglycemic drugs: Secondary | ICD-10-CM | POA: Insufficient documentation

## 2024-12-13 DIAGNOSIS — E119 Type 2 diabetes mellitus without complications: Secondary | ICD-10-CM | POA: Diagnosis not present

## 2024-12-13 DIAGNOSIS — C7989 Secondary malignant neoplasm of other specified sites: Secondary | ICD-10-CM

## 2024-12-13 DIAGNOSIS — M25511 Pain in right shoulder: Secondary | ICD-10-CM | POA: Insufficient documentation

## 2024-12-13 MED ORDER — SODIUM CHLORIDE 0.9 % IV SOLN: Freq: Once | INTRAVENOUS | Status: AC

## 2024-12-13 NOTE — Progress Notes (Signed)
 Orthopedic Tech Progress Note Patient Details:  Diane Mayo 1951/10/17 [id number]  Ortho Devices Type of Ortho Device: Sling immobilizer Ortho Device/Splint Location: RUE Ortho Device/Splint Interventions: Ordered, Application, Adjustment   Post Interventions Patient Tolerated: Well Instructions Provided: Care of device, Adjustment of device  [name] 12/13/2024, 5:10 PM

## 2024-12-13 NOTE — ED Provider Notes (Signed)
 " [place] EMERGENCY DEPARTMENT AT [facility] Provider Note  CSN: [id number] Arrival date & time: 12/13/24 1344  Chief Complaint(s) Shoulder Pain  HPI Diane Mayo is a 73 y.o. female history of diabetes, ongoing chemotherapy treatment presenting to the emergency department with right shoulder pain.  Denies any trauma.  Denies any fevers or chills.  Has been present for 2 days.  Taking home oxycodone  which helps some but has been persistent.  No other new symptoms.   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) 08/27/2024   Hypertriglyceridemia 11/26/2023   Acute respiratory failure with hypoxia (HCC) 02/04/2022   Acute pulmonary edema (HCC)    Sepsis (HCC)    Spinal stenosis of lumbar region 02/01/2022   Essential hypertension 07/08/2019   Candidal vulvovaginitis 01/25/2019   Diabetes mellitus (HCC) 01/25/2019   Menopausal symptom 01/25/2019   Obesity 01/25/2019   LBBB (left bundle branch block) 01/10/2018   Acute cystitis with hematuria 12/10/2014   Burning with urination 11/11/2014   Frequency of urination 08/13/2013   Chronic interstitial cystitis 08/07/2012   Home Medication(s) Prior to Admission medications  Medication Sig Start Date End Date Taking? Authorizing Provider  acetaminophen  (TYLENOL ) 500 MG tablet Take 2 tablets (1,000 mg total) by mouth every 6 (six) hours. 09/26/24   Tobie Eldora NOVAK, MD  allopurinol   (ZYLOPRIM ) 300 MG tablet Take 300 mg by mouth in the morning.    [provider]  amLODipine  (NORVASC ) 10 MG tablet Take 10 mg by mouth in the morning.    [provider]  bacitracin  ointment Apply to neck incision twice daily for 7 days 09/26/24 09/26/25  Tobie Eldora NOVAK, MD  cetirizine (ZYRTEC) 10 MG tablet Take 10 mg by mouth in the morning.    [provider]  ciprofloxacin (CIPRO) 500 MG tablet Take 500 mg by mouth 2 (two) times daily. for 5 days    [provider]  colchicine  0.6 MG tablet TAKE 1 TABLET BY MOUTH TWICE A DAY Patient taking differently: Take 0.6 mg by mouth 2 (two) times daily as needed (gout). 08/25/19   [name], DPM  fenofibrate  (TRICOR ) 48 MG tablet TAKE 1 TABLET BY MOUTH DAILY 11/07/24   [name], [name], DO  fluconazole  (DIFLUCAN ) 100 MG tablet Take 2 tablets today, then 1 tablet daily x 20 more days.  HOLD CRESTOR  AND COLCHICINE  WHILE ON THIS 12/08/24   Izell Domino, MD  hydrochlorothiazide  (MICROZIDE ) 12.5 MG capsule Take 12.5 mg by mouth in the morning. 12/06/21   [provider]  HYDROcodone -acetaminophen  (NORCO/VICODIN) 5-325 MG tablet Take 1-2 tablets by mouth every 4 (four) hours as needed for moderate pain (pain score 4-6) (take with food). Do not exceed 4000mg  of acetaminophen  daily. 12/01/24   Izell Domino, MD  icosapent  Ethyl (VASCEPA ) 1 g capsule Take 2 capsules (2 g total)  by mouth 2 (two) times daily. 03/18/24   [name], [name], DO  irbesartan  (AVAPRO ) 300 MG tablet TAKE 1 TABLET BY MOUTH DAILY 01/18/21   [name], MD  LORazepam  (ATIVAN ) 0.5 MG tablet Take 1 tablet (0.5 mg total) by mouth every 8 (eight) hours as needed. 11/17/24   Izell Domino, MD  magic mouthwash (nystatin , lidocaine , diphenhydrAMINE, alum & mag hydroxide) suspension Swish and spit 15 mLs 4 (four) times daily as needed for mouth pain. 11/19/24   Wyatt Leeroy HERO, PA-C  metFORMIN  (GLUCOPHAGE ) 1000 MG tablet Take 1,000 mg by mouth 2 (two) times  daily with a meal.    [provider]  metoprolol  tartrate (LOPRESSOR ) 25 MG tablet Take 75 mg by mouth 2 (two) times daily. 10/18/19   [provider]  nystatin  cream (MYCOSTATIN ) Apply 1 application topically daily as needed for rash.    [provider]  ondansetron  (ZOFRAN -ODT) 4 MG disintegrating tablet Take 4 mg by mouth every 8 (eight) hours as needed for nausea or vomiting.    [provider]  pantoprazole  (PROTONIX ) 40 MG tablet Take 40 mg by mouth 2 (two) times daily. 09/26/23   [provider]  Pentosan Polysulfate Sodium  200 MG CPDR Take 200 mg by mouth 2 (two) times daily.    [provider]  polyvinyl alcohol  (LIQUIFILM TEARS) 1.4 % ophthalmic solution Place 1 drop into both eyes as needed for dry eyes.    [provider]  PREVIDENT 5000 SENSITIVE 1.1-5 % GEL APPLY 5 GRAMS TO THE TEETH AT BEDTIME. USE WITH FLUORIDE TRAYS AT NIGHT. KEEP IT IN FOR 20 MINUTES 09/08/24   [provider]  promethazine  (PHENERGAN ) 25 MG tablet Take 1 tablet (25 mg total) by mouth every 6 (six) hours as needed for nausea or vomiting. 11/17/24   Izell Domino, MD  rosuvastatin  (CRESTOR ) 10 MG tablet TAKE 1 TABLET BY MOUTH DAILY 11/07/24   [name], [name], DO  scopolamine  (TRANSDERM-SCOP) 1 MG/3DAYS Place 1 patch (1 mg total) onto the skin every 3 (three) days. 12/08/24   Izell Domino, MD  Sod Fluoride-Potassium Nitrate 1.1-4.5 % GEL Place 5 g onto teeth. 09/08/24   [provider]  trimethoprim  (TRIMPEX ) 100 MG tablet Take 100 mg by mouth every evening. 12/13/20   [provider]  valACYclovir  (VALTREX ) 500 MG tablet Take 500 mg by mouth daily.    [provider]                                                                                                                                    Past Surgical History Past Surgical History:  Procedure Laterality Date   BACK SURGERY     L3 L4   CARPAL TUNNEL RELEASE  Bilateral    CESAREAN SECTION     twice 1978 and 1987   CHOLECYSTECTOMY     DILATION AND CURETTAGE OF UTERUS  1975   DIRECT  LARYNGOSCOPY N/A 09/26/2024   Procedure: LARYNGOSCOPY, DIRECT;  Surgeon: Tobie Eldora NOVAK, MD;  [facility];  Service: ENT;  Laterality: N/A;   EYE SURGERY Bilateral 2021   cataracts removed   RADICAL NECK DISSECTION Right 09/26/2024   Procedure: DISSECTION, NECK, RADICAL;  Surgeon: Tobie Eldora NOVAK, MD;  Location: [facility] OR;  Service: ENT;  Laterality: Right;   SHOULDER SURGERY Right 2012   rotator cuff repair   TONSILLECTOMY Bilateral 09/26/2024   Procedure: TONSILLECTOMY;  Surgeon: Tobie Eldora NOVAK, MD;  Location: [facility];  Service: ENT;  Laterality: Bilateral;   TUBAL LIGATION  1987   Family History Family History  Problem Relation Age of Onset   Hypertension Mother    Diabetes Mother    Other Mother        hay fever   Pneumonia Mother        aspiration   Gout Father    Skin cancer Father    Heart attack Father    Kidney failure Father    Hypertension Father    CVA Father    CAD Father    Asthma Brother    Other Brother        hay fever   Diabetes type II Maternal Grandfather    Alcoholism Brother     Social History Social History[1] Allergies Penicillins, Accupril [quinapril hcl], Januvia [sitagliptin], Monistat [miconazole], Tioconazole, and Zestril [lisinopril]  Review of Systems Review of Systems  All other systems reviewed and are negative.   Physical Exam Vital Signs  I have reviewed the triage vital signs BP (!) 146/85 (BP Location: Left Arm)   Pulse 89   Temp 99.1 F (37.3 C) (Oral)   Resp 16   Ht 5' (1.524 m)   SpO2 99%   BMI 37.45 kg/m  Physical Exam Vitals and nursing note reviewed.  Constitutional:      Appearance: Normal appearance.  HENT:     Head: Normocephalic and atraumatic.     Mouth/Throat:     Mouth: Mucous membranes are moist.  Eyes:     Conjunctiva/sclera: Conjunctivae normal.  Cardiovascular:     Rate  and Rhythm: Normal rate.  Pulmonary:     Effort: Pulmonary effort is normal. No respiratory distress.  Abdominal:     General: Abdomen is flat.  Musculoskeletal:        General: No deformity.     Comments: Intact but somewhat painful range of motion of the right shoulder.  No overlying warmth or erythema.  No deformity.  2+ distal radial pulse.  No swelling to the arm.  Skin:    General: Skin is warm and dry.     Capillary Refill: Capillary refill takes less than 2 seconds.  Neurological:     General: No focal deficit present.     Mental Status: She is alert. Mental status is at baseline.  Psychiatric:        Mood and Affect: Mood normal.        Behavior: Behavior normal.     ED Results and Treatments Labs (all labs ordered are listed, but only abnormal results are displayed) Labs Reviewed - No data to display  Radiology DG Shoulder Right Result Date: 12/13/2024 CLINICAL DATA:  Right shoulder pain unable to lift the arm EXAM: RIGHT SHOULDER - 2+ VIEW COMPARISON:  05/11/2011 FINDINGS: Clips in the right neck region. No fracture or malalignment. Moderate AC joint degenerative change and mild glenohumeral degenerative change. Mild calcific tendinopathy. IMPRESSION: 1. No acute osseous abnormality. 2. Degenerative changes. Mild calcific tendinopathy. Electronically Signed   By: Luke Bun M.D.   On: 12/13/2024 15:10    Pertinent labs & imaging results that were available during my care of the patient were reviewed by me and considered in my medical decision making (see MDM for details).  Medications Ordered in ED Medications - No data to display                                                                                                                                   Procedures Procedures  (including critical care time)  Medical Decision Making / ED  Course   MDM:  73 year old presenting to the emergency department with atraumatic shoulder pain.  X-ray obtained which shows some calcific tendinopathy.  Suspect this is likely cause of the patient's symptoms.  No fevers to suggest septic joint and range of motion is reassuring despite some pain.  X-ray without evidence of any fracture or dislocation.  No signs of extremity swelling to suggest DVT or circulatory issue.  Recommended follow-up with orthopedic surgery.  Provided sling with comfort but emphasized importance of range of motion exercises. Will discharge patient to home. All questions answered. Patient comfortable with plan of discharge. Return precautions discussed with patient and specified on the after visit summary.       Additional history obtained: -Additional history obtained from spouse -External records from outside source obtained and reviewed including: Chart review including previous notes, labs, imaging, consultation notes including prior notes    Imaging Studies ordered: I ordered imaging studies including XR shoulder  On my interpretation imaging demonstrates no acute process I independently visualized and interpreted imaging. I agree with the radiologist interpretation   Medicines ordered and prescription drug management: No orders of the defined types were placed in this encounter.   -I have reviewed the patients home medicines and have made adjustments as needed   Reevaluation: After the interventions noted above, I reevaluated the patient and found that their symptoms have improved  Co morbidities that complicate the patient evaluation  Past Medical History:  Diagnosis Date   Anemia    Diabetes mellitus without complication (HCC)    Type 2   Family history of adverse reaction to anesthesia    mother slow to wake up   Genital herpes    GERD (gastroesophageal reflux disease)    Hypertension    Interstitial cystitis    Left bundle branch block  (LBBB)    chronic   Leukocytosis    NASH (nonalcoholic steatohepatitis)    Osteoarthritis    Plantar fasciitis  Dispostion: Disposition decision including need for hospitalization was considered, and patient discharged from emergency department.    Final Clinical Impression(s) / ED Diagnoses Final diagnoses:  Acute pain of right shoulder     This chart was dictated using voice recognition software.  Despite best efforts to proofread,  errors can occur which can change the documentation meaning.     [1]  Social History Tobacco Use   Smoking status: Former    Current packs/day: 0.00    Average packs/day: 0.5 packs/day for 1 year (0.5 ttl pk-yrs)    Types: Cigarettes    Start date: 67    Quit date: 41    Years since quitting: 38.9   Smokeless tobacco: Never  Vaping Use   Vaping status: Never Used  Substance Use Topics   Alcohol  use: Not Currently    Comment: maybe one glass of champagne on holidays   Drug use: No     Francesca Elsie CROME, MD 12/13/24 1703  "

## 2024-12-13 NOTE — ED Triage Notes (Signed)
 Pt woke up with right shoulder pain yesterday, still unable to lift arm completely.

## 2024-12-13 NOTE — Discharge Instructions (Addendum)
 We evaluated you for your shoulder pain.  Your x-ray did not show any fractures or dislocation.  It did show some calcium  deposition in your tendons which can cause inflammation and pain.  We have given you a sling.  This is mainly for comfort.  Please make sure that you are performing range of motion exercises to avoid stiffness.  You can also apply ice or heat to help with your symptoms.  Please take at 1000 mg of Tylenol  every 6 hours as needed for pain.  Please continue to take your home oxycodone  as needed also.  Please follow-up with orthopedic surgery.  You can follow-up with deltoid orthopedic surgery since you have seen them previously.  If you have any new or worsening symptoms such as worsening or uncontrolled pain, fevers, inability to move your shoulder, please return to the emergency department for reassessment.

## 2024-12-13 NOTE — Progress Notes (Signed)
 Pt arrived to infusion center for 1L NaCl. Pt states she woke up yesterday with intense right shoulder pain. Pt states the pain does not radiate to jaw or anywhere else and she has been taking pain medication at home, including an oxycodone  this morning. Pt states she thinks she has slept on it wrong, but said she will probably visit the ED sometime after her IV fluids finish.

## 2024-12-13 NOTE — Patient Instructions (Signed)
 Fluids Given Through an IV (IV Infusion Therapy): What to Expect IV infusion therapy is a treatment to deliver a fluid, called an infusion, into a vein. You may have IV infusion to get: Fluids. Medicines. Nutrition. Chemotherapy. This is medicines to stop or slow down cancer cells. Blood or blood products. Dye that is given before an MRI or a CT scan. This is called contrast dye. Tell a health care provider about: Any allergies you have. This includes allergies to anesthesia or dyes. All medicines you take. These include vitamins, herbs, eye drops, and creams. Any bleeding problems you have. Any surgeries you've had, including if you've had lymph nodes taken out of your armpit or if you have a arteriovenous fistula for dialysis. Any medical problems you have. Whether you're pregnant or may be pregnant. Whether you've used IV drugs. What are the risks? Your health care provider will talk with you about risks. These may include: Pain, bruising, or bleeding. Infection. The IV leaking or moving out of place. Damage to blood vessels or nerves. Allergic reactions to medicines or dyes. A blood clot. An air bubble in the vein, also called an air embolism. What happens before the procedure? Eat and drink only as you've been told. Ask about changing or stopping: Any medicines you take. Any vitamins, herbs, or supplements you take. What happens during the procedure?     Placing the catheter Your skin at the IV site will be washed with fluid that kills germs. This will help prevent infection. IV infusion therapy starts with a procedure to place a soft tube called a catheter into a vein. An IV tube will be attached to the catheter to let the infusion flow into your blood. Your catheter may be placed: Into a vein that is usually in the bend of the elbow, forearm, or back of the hand. This is called a peripheral IV catheter. This may need to be put into a vein each time you get an  infusion. Into a vein near your elbow. This is called a midline catheter or a peripherally inserted central catheter (PICC). These types of catheters may stay in place for weeks or months at a time so you can receive repeated infusions through it. Into a vein near your neck that leads to your heart. This is called a non-tunneled catheter. This is only used for short amounts of time because it can cause infection. Through the skin of your chest and into a large vein that leads to your heart. This is called a tunneled catheter. This may stay in your body for months or years. Into an implanted port. An implanted port is a device that is surgically inserted under the skin of the chest to provide long-term IV access. The catheter will connect the port to a large vein in the chest or upper arm. A port may be kept in place for many months or years. Each time you have an infusion, a needle will be inserted through your skin to connect the catheter to the port. Doing the infusion To start the infusion, your provider will: Attach the IV tubing to your catheter. Use a tape or a bandage to hold the IV in place against your skin. An IV pump may be used to control the flow of the IV infusion. During the infusion, your provider will check the area to make sure: There is no bleeding, swelling, or pain. Your IV infusion is flowing correctly. After the infusion, your provider will: Take off the bandage  or tape. Disconnect the tubing from the catheter. Remove the catheter, if you have a peripheral IV. Apply pressure over the IV insertion site to stop bleeding, then cover the area with a bandage. If you have an implanted port, PICC, non-tunneled, or tunneled catheter, your catheter may remain in place. This depends on how many times you will need treatment, your medical condition, and what type of catheter you have. These steps may vary. Ask what you can expect. What can I expect after the procedure? You may be  watched closely until you leave. This includes checking your pain level, blood pressure, heart rate, and breathing rate. Your provider will check to make sure there are no signs of infection. Follow these instructions at home: Take your medicines only as told. Change or take off your bandage as told by your provider. Ask what things are safe for you to do at home. Ask when you can go back to work or school. Do not take baths, swim, or use a hot tub until you're told it's OK. Ask if you can shower. Check your IV insertion site every day for signs of infection. Check for: Redness, swelling, or pain. Fluid or blood. If fluid or blood drains from your IV site, use your hands to press down firmly on the area for a minute or two. Doing this should stop the bleeding. Warmth. Pus or a bad smell. Contact a health care provider if: You have signs of infection around your IV site. You have fluid or blood coming from your IV site that does not stop after you put pressure to the site. You have a rash or blisters. You have itchy, red, swollen areas of skin called hives. Get help right away if: You have a fever or chills. You have chest pain. You have trouble breathing. This information is not intended to replace advice given to you by your health care provider. Make sure you discuss any questions you have with your health care provider. Document Revised: 06/05/2023 Document Reviewed: 06/05/2023 Elsevier Patient Education  2024 [employer].

## 2024-12-15 NOTE — Progress Notes (Incomplete)
 Patient identity verified x2  Patient *** is here for a follow-up appointment today- *** for: ***  Treatment Completion Date: *** Pain issues, if any: *** Using a feeding tube?: *** Weight changes, if any: *** Swallowing issues, if any: *** Smoking or chewing tobacco? *** Using fluoride toothpaste daily? *** Last ENT visit was on: *** Other notable issues, if any: ***

## 2024-12-16 ENCOUNTER — Inpatient Hospital Stay

## 2024-12-16 ENCOUNTER — Inpatient Hospital Stay: Admitting: Dietician

## 2024-12-16 ENCOUNTER — Ambulatory Visit
Admission: RE | Admit: 2024-12-16 | Discharge: 2024-12-16 | Disposition: A | Discharge status: Home / Self Care | Source: Ambulatory Visit | Attending: Radiology | Admitting: Radiology

## 2024-12-16 ENCOUNTER — Encounter: Payer: Self-pay | Admitting: Radiology

## 2024-12-16 VITALS — BP 152/72 | HR 72 | Temp 98.9°F | Resp 18 | Ht 60.0 in | Wt 186.0 lb

## 2024-12-16 VITALS — BP 152/72 | HR 72 | Temp 98.9°F | Resp 18

## 2024-12-16 DIAGNOSIS — C4492 Squamous cell carcinoma of skin, unspecified: Secondary | ICD-10-CM

## 2024-12-16 DIAGNOSIS — C801 Malignant (primary) neoplasm, unspecified: Secondary | ICD-10-CM | POA: Insufficient documentation

## 2024-12-16 DIAGNOSIS — Z7984 Long term (current) use of oral hypoglycemic drugs: Secondary | ICD-10-CM | POA: Insufficient documentation

## 2024-12-16 DIAGNOSIS — Z510 Encounter for antineoplastic radiation therapy: Secondary | ICD-10-CM | POA: Diagnosis not present

## 2024-12-16 DIAGNOSIS — M19019 Primary osteoarthritis, unspecified shoulder: Secondary | ICD-10-CM | POA: Insufficient documentation

## 2024-12-16 DIAGNOSIS — Z79899 Other long term (current) drug therapy: Secondary | ICD-10-CM | POA: Insufficient documentation

## 2024-12-16 DIAGNOSIS — Y842 Radiological procedure and radiotherapy as the cause of abnormal reaction of the patient, or of later complication, without mention of misadventure at the time of the procedure: Secondary | ICD-10-CM | POA: Insufficient documentation

## 2024-12-16 DIAGNOSIS — Z79624 Long term (current) use of inhibitors of nucleotide synthesis: Secondary | ICD-10-CM | POA: Insufficient documentation

## 2024-12-16 DIAGNOSIS — L598 Other specified disorders of the skin and subcutaneous tissue related to radiation: Secondary | ICD-10-CM | POA: Insufficient documentation

## 2024-12-16 DIAGNOSIS — L989 Disorder of the skin and subcutaneous tissue, unspecified: Secondary | ICD-10-CM | POA: Insufficient documentation

## 2024-12-16 DIAGNOSIS — Z923 Personal history of irradiation: Secondary | ICD-10-CM | POA: Insufficient documentation

## 2024-12-16 DIAGNOSIS — C770 Secondary and unspecified malignant neoplasm of lymph nodes of head, face and neck: Secondary | ICD-10-CM | POA: Insufficient documentation

## 2024-12-16 DIAGNOSIS — K1230 Oral mucositis (ulcerative), unspecified: Secondary | ICD-10-CM | POA: Insufficient documentation

## 2024-12-16 MED ORDER — SODIUM CHLORIDE 0.9 % IV SOLN: Freq: Once | INTRAVENOUS | Status: AC

## 2024-12-16 MED ORDER — NYSTATIN 100000 UNIT/ML MT SUSP: 15.0000 mL | Freq: Four times a day (QID) | OROMUCOSAL | 2 refills | Status: AC | PRN

## 2024-12-16 NOTE — Progress Notes (Signed)
 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 She completed 30 fractions of radiation therapy. S/p b/L tonsillectomy 10/3. Last radiation on 12/15  12/20 - ED evaluation of acute right shoulder pain  Met with patient in infusion. She continues to require supportive therapy with IVF. Says she is having a bad morning. Infusion RN had difficulty finding a vein secondary to dehydration. PO intake is minimal. Tolerates a bite/sip or two at a time. Ongoing altered taste and odynophagia. Her mouth stays coated with thick saliva. She is doing baking soda salt water gargles. Reports episodes of nausea without vomiting. Sipping on warm tea at visit which she likes. Able to taste the sweetness from sugar packets.    Medications: reviewed   Labs: no new labs   Anthropometrics: Last wt 191 lb 12 oz on 12/2 - suspect wt stability r/t IVF  11/24 - 192 lb  11/17 - 199.8 lb  11/10 - 204.6 lb    NUTRITION DIAGNOSIS: Food and nutrition related knowledge deficit continues    INTERVENTION:  Suggested adding non-flavored protein powder to liquids - samples provided  Continue po trials - use pain medication as prescribed for ease of intake Continue baking soda salt water gargles  Supportive IVF per MD     MONITORING, EVALUATION, GOAL: wt trends, intake   NEXT VISIT: Tuesday December 30 during IVF

## 2024-12-16 NOTE — Patient Instructions (Signed)

## 2024-12-17 ENCOUNTER — Telehealth: Payer: Self-pay | Admitting: Radiation Oncology

## 2024-12-17 NOTE — Telephone Encounter (Signed)
 Spoke to pt to schedule 2week f/u appt with Dr. Izell for symptom management. Pt okay with 1/7@10am .

## 2024-12-20 ENCOUNTER — Inpatient Hospital Stay

## 2024-12-20 VITALS — BP 144/60 | HR 66 | Temp 98.1°F | Resp 16

## 2024-12-20 DIAGNOSIS — Z510 Encounter for antineoplastic radiation therapy: Secondary | ICD-10-CM | POA: Diagnosis not present

## 2024-12-20 DIAGNOSIS — C7989 Secondary malignant neoplasm of other specified sites: Secondary | ICD-10-CM

## 2024-12-20 MED ORDER — SODIUM CHLORIDE 0.9 % IV SOLN: Freq: Once | INTRAVENOUS | Status: AC

## 2024-12-20 MED ORDER — SODIUM CHLORIDE FLUSH 0.9 % IV SOLN: 10.0000 mL | Freq: Once | INTRAVENOUS | Status: DC

## 2024-12-20 NOTE — Patient Instructions (Signed)

## 2024-12-22 ENCOUNTER — Other Ambulatory Visit: Payer: Self-pay

## 2024-12-22 DIAGNOSIS — C4492 Squamous cell carcinoma of skin, unspecified: Secondary | ICD-10-CM

## 2024-12-22 NOTE — Progress Notes (Signed)
 Diane Mayo presents today for a two week follow up. 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   Pain issues, if any:  throat  3/10 when swallowing Using a feeding tube?: No feeding Weight changes, if any: weight  Swallowing issues, if any: painful to swallow, reports food gets stuck when trying to swallow. Smoking or chewing tobacco?  No Using fluoride toothpaste daily?  Yes, since Sunday she had recently found in home. Last ENT visit was on:  after surgery on October 09, 2024 Other notable issues, if any:   nothing at this time.

## 2024-12-23 ENCOUNTER — Inpatient Hospital Stay: Admitting: Dietician

## 2024-12-23 ENCOUNTER — Inpatient Hospital Stay

## 2024-12-23 DIAGNOSIS — Z510 Encounter for antineoplastic radiation therapy: Secondary | ICD-10-CM | POA: Diagnosis not present

## 2024-12-23 DIAGNOSIS — C7989 Secondary malignant neoplasm of other specified sites: Secondary | ICD-10-CM

## 2024-12-23 MED ORDER — SODIUM CHLORIDE 0.9 % IV SOLN: Freq: Once | INTRAVENOUS | Status: AC

## 2024-12-23 NOTE — Patient Instructions (Signed)
 Fluids Given Through an IV (IV Infusion Therapy): What to Expect IV infusion therapy is a treatment to deliver a fluid, called an infusion, into a vein. You may have IV infusion to get: Fluids. Medicines. Nutrition. Chemotherapy. This is medicines to stop or slow down cancer cells. Blood or blood products. Dye that is given before an MRI or a CT scan. This is called contrast dye. Tell a health care provider about: Any allergies you have. This includes allergies to anesthesia or dyes. All medicines you take. These include vitamins, herbs, eye drops, and creams. Any bleeding problems you have. Any surgeries you've had, including if you've had lymph nodes taken out of your armpit or if you have a arteriovenous fistula for dialysis. Any medical problems you have. Whether you're pregnant or may be pregnant. Whether you've used IV drugs. What are the risks? Your health care provider will talk with you about risks. These may include: Pain, bruising, or bleeding. Infection. The IV leaking or moving out of place. Damage to blood vessels or nerves. Allergic reactions to medicines or dyes. A blood clot. An air bubble in the vein, also called an air embolism. What happens before the procedure? Eat and drink only as you've been told. Ask about changing or stopping: Any medicines you take. Any vitamins, herbs, or supplements you take. What happens during the procedure?     Placing the catheter Your skin at the IV site will be washed with fluid that kills germs. This will help prevent infection. IV infusion therapy starts with a procedure to place a soft tube called a catheter into a vein. An IV tube will be attached to the catheter to let the infusion flow into your blood. Your catheter may be placed: Into a vein that is usually in the bend of the elbow, forearm, or back of the hand. This is called a peripheral IV catheter. This may need to be put into a vein each time you get an  infusion. Into a vein near your elbow. This is called a midline catheter or a peripherally inserted central catheter (PICC). These types of catheters may stay in place for weeks or months at a time so you can receive repeated infusions through it. Into a vein near your neck that leads to your heart. This is called a non-tunneled catheter. This is only used for short amounts of time because it can cause infection. Through the skin of your chest and into a large vein that leads to your heart. This is called a tunneled catheter. This may stay in your body for months or years. Into an implanted port. An implanted port is a device that is surgically inserted under the skin of the chest to provide long-term IV access. The catheter will connect the port to a large vein in the chest or upper arm. A port may be kept in place for many months or years. Each time you have an infusion, a needle will be inserted through your skin to connect the catheter to the port. Doing the infusion To start the infusion, your provider will: Attach the IV tubing to your catheter. Use a tape or a bandage to hold the IV in place against your skin. An IV pump may be used to control the flow of the IV infusion. During the infusion, your provider will check the area to make sure: There is no bleeding, swelling, or pain. Your IV infusion is flowing correctly. After the infusion, your provider will: Take off the bandage  or tape. Disconnect the tubing from the catheter. Remove the catheter, if you have a peripheral IV. Apply pressure over the IV insertion site to stop bleeding, then cover the area with a bandage. If you have an implanted port, PICC, non-tunneled, or tunneled catheter, your catheter may remain in place. This depends on how many times you will need treatment, your medical condition, and what type of catheter you have. These steps may vary. Ask what you can expect. What can I expect after the procedure? You may be  watched closely until you leave. This includes checking your pain level, blood pressure, heart rate, and breathing rate. Your provider will check to make sure there are no signs of infection. Follow these instructions at home: Take your medicines only as told. Change or take off your bandage as told by your provider. Ask what things are safe for you to do at home. Ask when you can go back to work or school. Do not take baths, swim, or use a hot tub until you're told it's OK. Ask if you can shower. Check your IV insertion site every day for signs of infection. Check for: Redness, swelling, or pain. Fluid or blood. If fluid or blood drains from your IV site, use your hands to press down firmly on the area for a minute or two. Doing this should stop the bleeding. Warmth. Pus or a bad smell. Contact a health care provider if: You have signs of infection around your IV site. You have fluid or blood coming from your IV site that does not stop after you put pressure to the site. You have a rash or blisters. You have itchy, red, swollen areas of skin called hives. Get help right away if: You have a fever or chills. You have chest pain. You have trouble breathing. This information is not intended to replace advice given to you by your health care provider. Make sure you discuss any questions you have with your health care provider. Document Revised: 06/05/2023 Document Reviewed: 06/05/2023 Elsevier Patient Education  2024 [employer].

## 2024-12-23 NOTE — Progress Notes (Signed)
 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 She completed 30 fractions of radiation therapy. S/p b/L tonsillectomy 10/3. Last radiation on 12/15   Met with patient in infusion. She is receiving supportive therapy with IVF. Patient reports feeling better. Throat is not nearly as sore. She has been tolerating thin liquids and few bites of smooth foods by mouth. Yesterday had 2 cups of tea, apple cider, water, cup of applesauce, and one bite of pudding. Over the holiday, she was able to eat a couple bites of a scrambled egg and few spoonfuls of cream of mushroom soup.    Medications: reviewed   Labs: no new labs  Anthropometrics: Last wt 186 lb on 12/23   NUTRITION DIAGNOSIS: Food and nutrition related knowledge deficit improved     INTERVENTION:  Continue working to increase po - suggested trying chicken/bone broth as well as adding liquid IV to water for electrolytes - pt goal to no longer need supportive therapy     MONITORING, EVALUATION, GOAL: wt trends, intake    NEXT VISIT: Wednesday January 14 via telephone

## 2024-12-24 ENCOUNTER — Other Ambulatory Visit: Payer: Self-pay

## 2024-12-24 DIAGNOSIS — C4442 Squamous cell carcinoma of skin of scalp and neck: Secondary | ICD-10-CM

## 2024-12-26 ENCOUNTER — Telehealth: Payer: Self-pay | Admitting: Radiation Oncology

## 2024-12-27 ENCOUNTER — Inpatient Hospital Stay

## 2024-12-29 ENCOUNTER — Telehealth: Payer: Self-pay | Admitting: *Deleted

## 2024-12-29 NOTE — Telephone Encounter (Signed)
 CALLED PATIENT TO INFORM OF PET SCAN FOR 03-06-25- ARRIVAL TIME- 8 AM @ [facility], PATIENT TO AVOID CARBS  THE LAST MEAL OF THE DAY  BEFORE SCAN, PATIENT TO HAVE WATER ONLY-6 HRS. PRIOR TO SCAN, PATIENT TO RECEIVE RESULTS FROM DR. SQUIRE ON 03-10-25 @ 2:40 PM, SPOKE WITH PATIENT AND SHE IS AWARE OF THESE APPTS. AND THE INSTUCTIONS

## 2024-12-31 ENCOUNTER — Ambulatory Visit
Admission: RE | Admit: 2024-12-31 | Discharge: 2024-12-31 | Disposition: A | Discharge status: Home / Self Care | Source: Ambulatory Visit | Attending: Radiation Oncology | Admitting: Radiation Oncology

## 2024-12-31 ENCOUNTER — Encounter: Payer: Self-pay | Admitting: Radiation Oncology

## 2024-12-31 ENCOUNTER — Ambulatory Visit
Admission: RE | Admit: 2024-12-31 | Discharge: 2024-12-31 | Disposition: A | Discharge status: Home / Self Care | Source: Ambulatory Visit | Attending: Oncology | Admitting: Oncology

## 2024-12-31 VITALS — BP 151/86 | HR 91 | Temp 97.0°F | Resp 20 | Ht 60.0 in | Wt 178.2 lb

## 2024-12-31 DIAGNOSIS — R110 Nausea: Secondary | ICD-10-CM | POA: Insufficient documentation

## 2024-12-31 DIAGNOSIS — C770 Secondary and unspecified malignant neoplasm of lymph nodes of head, face and neck: Secondary | ICD-10-CM | POA: Insufficient documentation

## 2024-12-31 DIAGNOSIS — Z79624 Long term (current) use of inhibitors of nucleotide synthesis: Secondary | ICD-10-CM | POA: Insufficient documentation

## 2024-12-31 DIAGNOSIS — E860 Dehydration: Secondary | ICD-10-CM | POA: Diagnosis not present

## 2024-12-31 DIAGNOSIS — C801 Malignant (primary) neoplasm, unspecified: Secondary | ICD-10-CM | POA: Diagnosis not present

## 2024-12-31 DIAGNOSIS — C4442 Squamous cell carcinoma of skin of scalp and neck: Secondary | ICD-10-CM

## 2024-12-31 DIAGNOSIS — Z923 Personal history of irradiation: Secondary | ICD-10-CM | POA: Insufficient documentation

## 2024-12-31 DIAGNOSIS — Z7984 Long term (current) use of oral hypoglycemic drugs: Secondary | ICD-10-CM | POA: Insufficient documentation

## 2024-12-31 DIAGNOSIS — K1230 Oral mucositis (ulcerative), unspecified: Secondary | ICD-10-CM | POA: Insufficient documentation

## 2024-12-31 DIAGNOSIS — Z79899 Other long term (current) drug therapy: Secondary | ICD-10-CM | POA: Diagnosis not present

## 2024-12-31 LAB — BASIC METABOLIC PANEL - CANCER CENTER ONLY
Anion gap: 16 — ABNORMAL HIGH (ref 5–15)
BUN: 6 mg/dL — ABNORMAL LOW (ref 8–23)
CO2: 26 mmol/L (ref 22–32)
Calcium: 9.4 mg/dL (ref 8.9–10.3)
Chloride: 97 mmol/L — ABNORMAL LOW (ref 98–111)
Creatinine: 0.75 mg/dL (ref 0.44–1.00)
GFR, Estimated: >60 mL/min
Glucose, Bld: 138 mg/dL — ABNORMAL HIGH (ref 70–99)
Potassium: 3.3 mmol/L — ABNORMAL LOW (ref 3.5–5.1)
Sodium: 139 mmol/L (ref 135–145)

## 2024-12-31 NOTE — Addendum Note (Signed)
 Encounter addended by: Izell Domino, MD on: 12/31/2024 12:38 PM  Actions taken: Clinical Note Signed

## 2024-12-31 NOTE — Progress Notes (Addendum)
 " Radiation Oncology         [phone] ________________________________  Name: Diane Mayo MRN: [id number]  Date: 12/31/2024  DOB: Dec 18, 1951  Follow-Up Visit Note  CC: Sun, Vyvyan, MD  Sun, Vyvyan, MD  Diagnosis and Prior Radiotherapy:       ICD-10-CM   1. 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 [facility]) Staging form: Cervical Lymph Nodes and Unknown Primary Tumors of the Head and Neck, AJCC 8th Edition - Clinical stage from 08/27/2024: Stage IVA (cT0, cN2a, cM0) - Signed by Autumn Millman, MD on 10/03/2024 Stage prefix: Initial diagnosis   ==========DELIVERED PLANS==========  First Treatment Date: 2024-10-27 Last Treatment Date: 2024-12-08   Plan Name: HN_Oroph Site: Oropharynx Technique: IMRT Mode: Photon Dose Per Fraction: 2 Gy Prescribed Dose (Delivered / Prescribed): 60 Gy / 60 Gy Prescribed Fxs (Delivered / Prescribed): 30 / 30  Stage IVA (cT0, N2a, M0) squamous cell carcinoma of unknown primary with metastasis to right cervical lymph node; s/p definitive radiation completed on 12/08/2024   CHIEF COMPLAINT:  Here for follow-up and surveillance of neck cancer  Narrative:   Diane Mayo presents today for a two week follow up. 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   Pain issues, if any:  throat  3/10 when swallowing Using a feeding tube?: No feeding tube she is still having an aversion to food but drinking Gatorade, water, coffee, milk, milkshakes-she is also trying solid foods but not tolerating them well Weight changes, if any: weight  Swallowing issues, if any: painful to swallow, reports food gets stuck when trying to swallow.  But this is improving and she is still following with swallowing therapy. Smoking or chewing tobacco?  No Using fluoride toothpaste daily?  Yes, since  Sunday she had recently found fluoride trays in home. Last ENT visit was on:  after surgery on October 09, 2024 Other notable issues, if any:   nothing at this time.  Overall she is feeling better but does feel debilitated.  She canceled her IV fluids because her fluid intake has improved significantly             ALLERGIES:  is allergic to penicillins, accupril [quinapril hcl], januvia [sitagliptin], ondansetron  hcl, polysaccharide iron complex, monistat [miconazole], tioconazole, and zestril [lisinopril].  Meds: Current Outpatient Medications  Medication Sig Dispense Refill   COMIRNATY syringe      acetaminophen  (TYLENOL ) 500 MG tablet Take 2 tablets (1,000 mg total) by mouth every 6 (six) hours. 100 tablet 0   allopurinol  (ZYLOPRIM ) 300 MG tablet Take 300 mg by mouth in the morning.     amLODipine  (NORVASC ) 10 MG tablet Take 10 mg by mouth in the morning.     bacitracin  ointment Apply to neck incision twice daily for 7 days 30 g 0   cetirizine (ZYRTEC) 10 MG tablet Take 10 mg by mouth in the morning.     ciprofloxacin (CIPRO) 500 MG tablet Take 500 mg by mouth 2 (two) times daily. for 5 days     colchicine  0.6 MG tablet TAKE 1 TABLET BY MOUTH TWICE A DAY (Patient taking differently: Take 0.6 mg by mouth 2 (two) times daily as needed (gout).) 30 tablet 0   fenofibrate  (TRICOR ) 48 MG tablet TAKE 1 TABLET BY MOUTH DAILY 90 tablet 1  fluconazole  (DIFLUCAN ) 100 MG tablet Take 2 tablets today, then 1 tablet daily x 20 more days.  HOLD CRESTOR  AND COLCHICINE  WHILE ON THIS 22 tablet 0   hydrochlorothiazide  (MICROZIDE ) 12.5 MG capsule Take 12.5 mg by mouth in the morning.     HYDROcodone -acetaminophen  (NORCO/VICODIN) 5-325 MG tablet Take 1-2 tablets by mouth every 4 (four) hours as needed for moderate pain (pain score 4-6) (take with food). Do not exceed 4000mg  of acetaminophen  daily. 75 tablet 0   icosapent  Ethyl (VASCEPA ) 1 g capsule Take 2 capsules (2 g total) by mouth 2 (two) times daily. 120  capsule 3   irbesartan  (AVAPRO ) 300 MG tablet TAKE 1 TABLET BY MOUTH DAILY 90 tablet 3   LORazepam  (ATIVAN ) 0.5 MG tablet Take 1 tablet (0.5 mg total) by mouth every 8 (eight) hours as needed. 60 tablet 0   magic mouthwash (nystatin , lidocaine , diphenhydrAMINE, alum & mag hydroxide) suspension Swish and spit 15 mLs 4 (four) times daily as needed for mouth pain. 180 mL 2   metFORMIN  (GLUCOPHAGE ) 1000 MG tablet Take 1,000 mg by mouth 2 (two) times daily with a meal.     metoprolol  tartrate (LOPRESSOR ) 25 MG tablet Take 75 mg by mouth 2 (two) times daily.     nystatin  cream (MYCOSTATIN ) Apply 1 application topically daily as needed for rash.     ondansetron  (ZOFRAN -ODT) 4 MG disintegrating tablet Take 4 mg by mouth every 8 (eight) hours as needed for nausea or vomiting.     pantoprazole  (PROTONIX ) 40 MG tablet Take 40 mg by mouth 2 (two) times daily.     Pentosan Polysulfate Sodium  200 MG CPDR Take 200 mg by mouth 2 (two) times daily.     polyvinyl alcohol  (LIQUIFILM TEARS) 1.4 % ophthalmic solution Place 1 drop into both eyes as needed for dry eyes.     PREVIDENT 5000 SENSITIVE 1.1-5 % GEL APPLY 5 GRAMS TO THE TEETH AT BEDTIME. USE WITH FLUORIDE TRAYS AT NIGHT. KEEP IT IN FOR 20 MINUTES     promethazine  (PHENERGAN ) 25 MG tablet Take 1 tablet (25 mg total) by mouth every 6 (six) hours as needed for nausea or vomiting. 60 tablet 5   rosuvastatin  (CRESTOR ) 10 MG tablet TAKE 1 TABLET BY MOUTH DAILY 90 tablet 1   scopolamine  (TRANSDERM-SCOP) 1 MG/3DAYS Place 1 patch (1 mg total) onto the skin every 3 (three) days. 10 patch 1   Sod Fluoride-Potassium Nitrate 1.1-4.5 % GEL Place 5 g onto teeth.     triamcinolone  cream (KENALOG ) 0.1 % Apply 1 Application topically 2 (two) times daily.     trimethoprim  (TRIMPEX ) 100 MG tablet Take 100 mg by mouth every evening.     valACYclovir  (VALTREX ) 500 MG tablet Take 500 mg by mouth daily.     No current facility-administered medications for this encounter.     Physical Findings: The patient is in no acute distress. Patient is alert and oriented. Wt Readings from Last 3 Encounters:  12/31/24 178 lb 3.2 oz (80.8 kg)  12/16/24 186 lb (84.4 kg)  11/25/24 191 lb 12 oz (87 kg)    height is 5' (1.524 m) and weight is 178 lb 3.2 oz (80.8 kg). Her temperature is 97 F (36.1 C) (abnormal). Her blood pressure is 151/86 (abnormal) and her pulse is 91. Her respiration is 20 and oxygen saturation is 100%. .  General: Alert and oriented, nontoxic-appearing HEENT: Head is normocephalic. Extraocular movements are intact. Oropharynx is notable for white coating to the tongue which  has persisted despite fluconazole .  This is consistent with mucositis/radiation changes.  no evidence of thrush in the buccal mucosa or oropharynx Neck: Neck is notable for no obvious lymphadenopathy.  Skin has healed well and right neck surgical incision site is intact Skin: Skin in treatment fields shows good healing over the neck MSK: She needs help getting up onto the examination table. Lymphatics: see Neck Exam Psychiatric: Judgment and insight are intact. Affect is appropriate.   Lab Findings: Lab Results  Component Value Date   WBC 9.9 10/03/2024   HGB 10.6 (L) 10/03/2024   HCT 32.6 (L) 10/03/2024   MCV 82.5 10/03/2024   PLT 290 10/03/2024    Lab Results  Component Value Date   TSH 2.000 10/03/2024    Radiographic Findings: DG Shoulder Right Result Date: 12/13/2024 CLINICAL DATA:  Right shoulder pain unable to lift the arm EXAM: RIGHT SHOULDER - 2+ VIEW COMPARISON:  05/11/2011 FINDINGS: Clips in the right neck region. No fracture or malalignment. Moderate AC joint degenerative change and mild glenohumeral degenerative change. Mild calcific tendinopathy. IMPRESSION: 1. No acute osseous abnormality. 2. Degenerative changes. Mild calcific tendinopathy. Electronically Signed   By: Luke Bun M.D.   On: 12/13/2024 15:10    Impression/Plan:  Stage IVA (cT0, N2a,  M0) squamous cell carcinoma of unknown primary with metastasis to right cervical lymph node; s/p definitive radiation completed on 12/08/2024  1) Head and Neck Cancer Status: Healing slowly from radiation treatment.  # Mucositis - Encouraged continued saltwater/baking soda rinse to help with thick saliva.  She has completed a regimen of fluconazole .  No obvious thrush today despite white coating on tongue which is probably radiation changes  # Nausea - taking compazine PRN  # Radiation dermatitis -resolved  # Dehydration -drinking well.  IV fluids were canceled by patient.  BMP ordered for today    2) Nutritional Status: Encouraged patient to try oral food as tolerated.  Push milkshakes and milk because this is the caloric source that she tolerates best.  Reiterated the importance of getting as many calories and as much protein as possible.  She is adamant to avoid a feeding tube.  Nutrition is following closely.  I advised her today to start taking a daily multivitamin given that her diet is not yet very diverse Wt Readings from Last 3 Encounters:  12/31/24 178 lb 3.2 oz (80.8 kg)  12/16/24 186 lb (84.4 kg)  11/25/24 191 lb 12 oz (87 kg)  PEG tube: N/A  3) Risk Factors: The patient has been educated about risk factors including alcohol  and tobacco abuse; they understand that avoidance of alcohol  and tobacco is important to prevent recurrences as well as other cancers.. Denies current tobacco use.   4) Swallowing: Functional. Patient denies dysphagia or odynophagia. She is scheduled with SLP on 01/01/2025.   5) Dental: Encouraged to continue regular followup with dentistry, and dental hygiene including fluoride rinses.  She has a dental exam next month  6) Thyroid  function: We will check annually.  Lab Results  Component Value Date   TSH 2.000 10/03/2024    7) Other: Scheduled with PT on 01/01/2025 advised her to discuss rehabilitative physical therapy tomorrow given that she is very  deconditioned  Follow-up in March after surveillance imaging  The patient was encouraged to call with any issues or questions before then.   On date of service, in total, I spent 30 minutes on this encounter. Patient was seen in person. -----------------------------------  Lauraine Golden, MD  "

## 2025-01-01 ENCOUNTER — Ambulatory Visit: Payer: Self-pay | Attending: Radiation Oncology | Admitting: Physical Therapy

## 2025-01-01 ENCOUNTER — Ambulatory Visit: Attending: Radiation Oncology

## 2025-01-01 DIAGNOSIS — C7989 Secondary malignant neoplasm of other specified sites: Secondary | ICD-10-CM | POA: Insufficient documentation

## 2025-01-01 DIAGNOSIS — R1313 Dysphagia, pharyngeal phase: Secondary | ICD-10-CM | POA: Insufficient documentation

## 2025-01-01 DIAGNOSIS — L599 Disorder of the skin and subcutaneous tissue related to radiation, unspecified: Secondary | ICD-10-CM | POA: Insufficient documentation

## 2025-01-01 DIAGNOSIS — C4492 Squamous cell carcinoma of skin, unspecified: Secondary | ICD-10-CM | POA: Insufficient documentation

## 2025-01-01 DIAGNOSIS — M6281 Muscle weakness (generalized): Secondary | ICD-10-CM | POA: Insufficient documentation

## 2025-01-01 DIAGNOSIS — R262 Difficulty in walking, not elsewhere classified: Secondary | ICD-10-CM | POA: Insufficient documentation

## 2025-01-01 DIAGNOSIS — R293 Abnormal posture: Secondary | ICD-10-CM | POA: Insufficient documentation

## 2025-01-01 NOTE — Therapy (Signed)
 " OUTPATIENT SPEECH LANGUAGE PATHOLOGY ONCOLOGY TREATMENT-RECERTIFICATION   Patient Name: Diane Mayo MRN: [id number] DOB:[birth year], 74 y.o., female Today's Date: 01/01/2025  PCP: Austin ROCKFORD , MD REFERRING PROVIDER: Izell Domino, MD  END OF SESSION:  End of Session - 01/01/25 2026     Visit Number 3    Number of Visits 6    Date for Recertification  04/01/25    SLP Start Time 1020    SLP Stop Time  1055    SLP Time Calculation (min) 35 min    Activity Tolerance Patient tolerated treatment well            Past Medical History:  Diagnosis Date   Anemia    Diabetes mellitus without complication (HCC)    Type 2   Family history of adverse reaction to anesthesia    mother slow to wake up   Genital herpes    GERD (gastroesophageal reflux disease)    Hypertension    Interstitial cystitis    Left bundle branch block (LBBB)    chronic   Leukocytosis    NASH (nonalcoholic steatohepatitis)    Osteoarthritis    Plantar fasciitis    Past Surgical History:  Procedure Laterality Date   BACK SURGERY     L3 L4   CARPAL TUNNEL RELEASE Bilateral    CESAREAN SECTION     twice 1978 and 1987   CHOLECYSTECTOMY     DILATION AND CURETTAGE OF UTERUS  1975   DIRECT LARYNGOSCOPY N/A 09/26/2024   Procedure: LARYNGOSCOPY, DIRECT;  Surgeon: Tobie Eldora NOVAK, MD;  Location[facility] OR;  Service: ENT;  Laterality: N/A;   EYE SURGERY Bilateral 2021   cataracts removed   RADICAL NECK DISSECTION Right 09/26/2024   Procedure: DISSECTION, NECK, RADICAL;  Surgeon: Tobie Eldora NOVAK, MD;  Location: [facility] OR;  Service: ENT;  Laterality: Right;   SHOULDER SURGERY Right 2012   rotator cuff repair   TONSILLECTOMY Bilateral 09/26/2024   Procedure: TONSILLECTOMY;  Surgeon: Tobie Eldora NOVAK, MD;  Location: [facility] OR;  Service: ENT;  Laterality: Bilateral;   TUBAL LIGATION  1987   Patient Active Problem List   Diagnosis Date Noted   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)  08/27/2024   Hypertriglyceridemia 11/26/2023   Acute respiratory failure with hypoxia (HCC) 02/04/2022   Acute pulmonary edema (HCC)    Sepsis (HCC)    Spinal stenosis of lumbar region 02/01/2022   Essential hypertension 07/08/2019   Candidal vulvovaginitis 01/25/2019   Diabetes mellitus (HCC) 01/25/2019   Menopausal symptom 01/25/2019   Obesity 01/25/2019   LBBB (left bundle branch block) 01/10/2018   Acute cystitis with hematuria 12/10/2014   Burning with urination 11/11/2014   Frequency of urination 08/13/2013   Chronic interstitial cystitis 08/07/2012   Speech Therapy Progress Note  Dates of Reporting Period: 10/30/24 to present  Subjective Statement: Pt has been seen for 3 ST sessions focusing on swallowing during and after rad tx.  Objective: See below.  Goal Update: See below  Plan: see pt for 2-3 more sessions  Reason Skilled Services are Required: In order to assess safety of PO intake, assess the need for recommending any objective swallow assessment, and ensuring pt is correctly completing the individualized HEP.   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  Pt accompanied by: significant other- husband Butch  PERTINENT HISTORY:  SCC metastatic to right cervical lymph node, unknown primary, stage IVA (T0 N2 M0 p 16 +. She presented to her PCP in late January of 2025 with concerns over a mass on her right neck. She underwent a head and neck US  on 03/18/24 showing a 4.5 cm complex cystic mass in or adjacent to the right parotid gland. To further investigate her symptoms she then underwent a neck CT on 04/09/24 showing a  right level 2A there is a predominantly cystic lesion measuring up  to 3.2 cm in greatest axial dimension. 07/16/24 She saw Dr. Tobie for a consult regarding her mass. She then underwent a fine needle aspiration biopsy of right parotid with pathology indicating squamous cell carcinoma. Immunohistochemical stain for p16 was performed. The stain show increased staining in the basal layer of the squamous epithelium but overall it is patchy. There is no definitive evidence of block like pattern of staining. The finding does not support a HPV related squamous cell carcinoma... The possibility of squamous cell carcinoma arising from a branchial cleft cyst is considered. However, a metastatic squamous cell carcinoma cannot be excluded. 08/15/24 PET showed a right-sided neck mass demonstrates low level heterogeneous peripheral FDG accumulation with SUV of 4.3 with no other hypermetabolic lymphadenopathy in the neck. No evidence for hypermetabolic metastatic disease in the chest, abdomen, or pelvis. 08/14/24 follow up with Dr. Tobie the patient opted to proceed with bilateral tonsillectomy, radical neck dissection, and directed laryngoscopy with biopsies on 09/26/24 (delayed due to a grandchild's wedding). 08/27/24 Radiation consult and 10/03/24 medical oncology consult. Once 09/26/24 biopsy results returned radiation only was recommended. Treatment plan: She will receive 30 fractions of radiation to her Oropharynx and bilateral neck which started on 10/27/24 and complete 12/08/24.  PAIN:  Are you having pain? Yes: NPRS scale: 3/10 Pain location: throat Pain description: sore Aggravating factors: swallowing Relieving factors: warm liquid  FALLS: Has patient fallen in last 6 months?  No   PATIENT GOALS: Maintain WNL swallowing  OBJECTIVE:  Note: Objective measures were completed at Evaluation unless otherwise noted.                                                                                                                  TREATMENT DATE:  01/01/25: [employer] milk, cook out  milkshake, hot tea, fried egg over the weekend but experienced taste aversion. SLP stressed liquids at this time with high protein high calories. Reiterated the use of the food journal. Today pt ate applesauce and drank H2O with 30% incidence of throat clearing. Pt without overt s/sx aspiration PNA and none reported. Today SLP provided pt with and educated pt and husband about overt s/sx aspiration PNA and pt told SLP 3 overt s/sx with independence. Ahmyah completed HEP with mod I, stated she was completing at suboptimal level the last 3 weeks and SLP told her to  complete at least 30 reps/day of Masako, effortful, and Mendelsohn, and chin tuck with towel if desired.   11/27/24:Pt drank water and had teaspoons of ice cream/frosty without any s/s oropharyngeal dysphagia. Pt used a looking ahead (not turned to one side) posture with these boluses. Pt tries bites of solids regularly but not more than a few bites, denies s/s oropharyngeal dysphagia with these bites. SLP encouraged pt to use pain meds prescribed by Dr. Izell if she needs them for POs, as this is the main reason her PO intake is not optimal at this time. Pt has completed HEP with suboptimal frequency and scope, SLP reiterated to complete as much as possible currently, and as she cont to heal after completion of radiation, to increase reps of all exercises to at least 20/day (except Shaker - 6/day). SLP encouraged pt to also perform chin tuck exercise (#5) and SLP showed pt this exercise and she return demonstrated correctly. She told SLP rationale for HEP, and after SLP explanation of food journal, told SLP how it would be beneficial for her.   10/30/24: Research states the risk for dysphagia increases due to radiation and/or chemotherapy treatment due to a variety of factors, so SLP educated the pt about the possibility of reduced/limited ability for PO intake during rad tx. SLP also educated pt regarding possible changes to swallowing musculature  after rad tx, and why adherence to dysphagia HEP provided today and PO consumption was necessary to inhibit muscle fibrosis following rad tx and to mitigate muscle disuse atrophy. SLP informed pt why this would be detrimental to their swallowing status and to their pulmonary health. Pt demonstrated understanding of these things to SLP. SLP encouraged pt to safely eat and drink as deep into their radiation/chemotherapy as possible to provide the best possible long-term swallowing outcome for pt.  SLP then developed an individualized HEP for pt involving oral and pharyngeal strengthening and ROM and pt was instructed how to perform these exercises, including SLP demonstration. After SLP demonstration, pt return demonstrated each exercise. SLP ensured pt performance was correct prior to educating pt on next exercise. Pt required occasional min-mod cues faded to modified independent to perform HEP. Pt was instructed to complete this program 5-7 days/week, at least 20 reps a day until 6 months after his or her last day of rad tx, and then x2 a week after that, indefinitely. Among other modifications for days when pt cannot functionally swallow, SLP also suggested pt to perform only non-swallowing tasks on the handout/HEP, and if necessary to cycle through the swallowing portion so the full program of exercises can be completed instead of fatiguing on one of the swallowing exercises and being unable to perform the other swallowing exercises. SLP instructed that swallowing exercises should then be added back into the regimen as pt is able to do so.    PATIENT EDUCATION: Education details: late effects head/neck radiation on swallow function, HEP procedure, and modification to HEP when difficulty experienced with swallowing during and after radiation course Person educated: Patient and Spouse Education method: Explanation, Demonstration, and Verbal cues Education comprehension: verbalized understanding, returned  demonstration, verbal cues required, and needs further education   ASSESSMENT:  CLINICAL IMPRESSION: RECERT TODAY. Patient is a 74 y.o. F who was seen today for treatment of swallowing following radiation therapy. Today pt drank thin liquids and had bites of applesauce as described above. At this time pt swallowing is deemed WNL/WFL with these POs. No overt s/sx aspiration were observed. There are  no overt s/s aspiration PNA observed by SLP nor any reported by pt at this time. Data indicate that pt's swallow ability will likely decrease over the course of radiation/chemoradiation therapy and could very well decline over time following the conclusion of that therapy due to muscle disuse atrophy and/or muscle fibrosis. Pt will cont to need to be seen by SLP in order to assess safety of PO intake, assess the need for recommending any objective swallow assessment, and ensuring pt is correctly completing the individualized HEP.  OBJECTIVE IMPAIRMENTS: include dysphagia. These impairments are limiting patient from safety when swallowing. Factors affecting potential to achieve goals and functional outcome are none noted today. Patient will benefit from skilled SLP services to address above impairments and improve overall function.   REHAB POTENTIAL: Good     GOALS: Goals reviewed with patient? No   SHORT TERM GOALS: Target: 3rd total session   1. Pt will complete HEP with modified independence in 2 sessions Baseline:11/27/24 Goal status: met   2.  pt will tell SLP why pt is completing HEP with modified independence Baseline:  Goal status: met   3.  pt will describe 3 overt s/s aspiration PNA with modified independence Baseline:  Goal status: met   4.  pt will tell SLP how a food journal could hasten return to a more normalized diet Baseline:  Goal status: met     LONG TERM GOALS: Target: 7th total session   1.  pt will complete HEP with independence over two visits Baseline:  Goal  status: INITIAL   2.  pt will describe how to modify HEP over time, and the timeline associated with reduction in HEP frequency with modified independence over two sessions Baseline:  Goal status: INITIAL     PLAN:   SLP FREQUENCY:  once approx every 4 weeks   SLP DURATION:  7 sessions   PLANNED INTERVENTIONS: Aspiration precaution training, Pharyngeal strengthening exercises, Diet toleration management , Trials of upgraded texture/liquids, SLP instruction and feedback, Compensatory strategies, and Patient/family education, [phone] (treatment of swallowing dysfunction and/or oral function for feeding)    Sihaam Chrobak, CCC-SLP 01/01/2025, 8:27 PM      "

## 2025-01-01 NOTE — Therapy (Signed)
 " OUTPATIENT PHYSICAL THERAPY HEAD AND NECK POST RADIATION FOLLOW UP   Patient Name: Diane Mayo MRN: [id number] DOB:Dec 31, 1950, 74 y.o., female Today's Date: 01/01/2025  END OF SESSION:  PT End of Session - 01/01/25 1141     Visit Number 2    Number of Visits 10    Date for Recertification  01/29/25    PT Start Time 1108    PT Stop Time 1140    PT Time Calculation (min) 32 min    Activity Tolerance Patient tolerated treatment well    Behavior During Therapy WFL for tasks assessed/performed          Past Medical History:  Diagnosis Date   Anemia    Diabetes mellitus without complication (HCC)    Type 2   Family history of adverse reaction to anesthesia    mother slow to wake up   Genital herpes    GERD (gastroesophageal reflux disease)    Hypertension    Interstitial cystitis    Left bundle branch block (LBBB)    chronic   Leukocytosis    NASH (nonalcoholic steatohepatitis)    Osteoarthritis    Plantar fasciitis    Past Surgical History:  Procedure Laterality Date   BACK SURGERY     L3 L4   CARPAL TUNNEL RELEASE Bilateral    CESAREAN SECTION     twice 1978 and 1987   CHOLECYSTECTOMY     DILATION AND CURETTAGE OF UTERUS  1975   DIRECT LARYNGOSCOPY N/A 09/26/2024   Procedure: LARYNGOSCOPY, DIRECT;  Surgeon: Tobie Eldora NOVAK, MD;  Location: [facility] OR;  Service: ENT;  Laterality: N/A;   EYE SURGERY Bilateral 2021   cataracts removed   RADICAL NECK DISSECTION Right 09/26/2024   Procedure: DISSECTION, NECK, RADICAL;  Surgeon: Tobie Eldora NOVAK, MD;  Location: [facility] OR;  Service: ENT;  Laterality: Right;   SHOULDER SURGERY Right 2012   rotator cuff repair   TONSILLECTOMY Bilateral 09/26/2024   Procedure: TONSILLECTOMY;  Surgeon: Tobie Eldora NOVAK, MD;  Location: [facility] OR;  Service: ENT;  Laterality: Bilateral;   TUBAL LIGATION  1987   Patient Active Problem List   Diagnosis Date Noted   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) 08/27/2024    Hypertriglyceridemia 11/26/2023   Acute respiratory failure with hypoxia (HCC) 02/04/2022   Acute pulmonary edema (HCC)    Sepsis (HCC)    Spinal stenosis of lumbar region 02/01/2022   Essential hypertension 07/08/2019   Candidal vulvovaginitis 01/25/2019   Diabetes mellitus (HCC) 01/25/2019   Menopausal symptom 01/25/2019   Obesity 01/25/2019   LBBB (left bundle branch block) 01/10/2018   Acute cystitis with hematuria 12/10/2014   Burning with urination 11/11/2014   Frequency of urination 08/13/2013   Chronic interstitial cystitis 08/07/2012    PCP: Arnett Repress, MD   REFERRING PROVIDER: Dr. 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 I have just started coming out of the fog. The last radiation was 3 weeks ago on Monday. My throat hurts. I can not eat. I am hungry. I feel like I get off balance at time.   PERTINENT HISTORY:  SCC metastatic to right cervical lymph node, unknown primary, stage IVA (T0 N2 M0 p 16 +. She presented to her PCP in late January of 2025 with concerns over a mass on her right neck. She underwent a head and neck US  on 03/18/24 showing a 4.5 cm complex cystic mass in or adjacent to the right parotid gland. To further investigate her symptoms she then underwent a neck CT on  04/09/24 showing a  right level 2A there is a predominantly cystic lesion measuring up to 3.2 cm in greatest axial dimension.   07/16/24 She saw Dr. Tobie for a consult regarding her mass. She then underwent a fine needle aspiration biopsy of right parotid with pathology indicating squamous cell carcinoma. Immunohistochemical stain for p16 was performed. The stain show increased staining in the basal layer of the squamous epithelium but overall it is patchy. There is no definitive evidence of block like pattern of staining. The finding does not support a HPV related squamous cell carcinoma... The possibility of squamous cell carcinoma arising from a branchial cleft cyst is considered. However, a metastatic squamous cell carcinoma cannot be excluded. 08/15/24 PET showed a right-sided neck mass demonstrates low level heterogeneous peripheral FDG accumulation with SUV of 4.3 with no other hypermetabolic lymphadenopathy in the neck. No evidence for hypermetabolic metastatic disease in the chest, abdomen, or pelvis. 08/14/24 follow up with Dr. Tobie the patient opted to proceed with bilateral tonsillectomy and radical neck dissection (1/20 nodes) 09/26/24 (delayed due to a grandchild's wedding). She will receive 30 fractions of radiation to her Oropharynx and bilateral neck which started on 10/27/24 and complete 12/08/24. 09/26/24 Bilateral tonsillectomy with biopsies. Hx of back surgery L3/L4  PATIENT GOALS:  Reassess how my recovery is going related to neck ROM, cervical pain, fatigue, and swelling.  PAIN:  Are you having pain? Yes NPRS scale: 3/10 Pain location: throat Pain orientation: Right and Left  PAIN TYPE: sore Pain description: intermittent  Aggravating factors: when swallowing, breathing hard, or eat Relieving factors: drinking warm liquids  PRECAUTIONS: Recent radiation, Head and neck lymphedema risk,    OBJECTIVE:   POSTURE:  Forward head and rounded shoulders posture  30 SEC SIT TO  STAND: 01/01/25: 8 reps in 30 sec without use of UEs which is poor for pt's age At baseline eval on 10/30/24: 10 reps in 30 sec without use of UEs which is  Below average for patient's age   SHOULDER AROM:   [facility]     CERVICAL AROM:     Percent limited baseline on 10/30/24 01/01/25  Flexion WFL WFL  Extension [facility] WFL  Right lateral flexion 25% limited WFL  Left lateral flexion 25% limited 25% limited  Right rotation [facility] WFL  Left rotation WFL WFL                          (Blank rows=not tested)    LYMPHEDEMA ASSESSMENT:    Circumference in cm  4 cm superior to sternal notch around neck 35.2  6 cm superior to sternal notch around neck 36.5  8 cm superior to sternal notch around neck 39.5  R lateral nostril from base of nose to medial tragus  L lateral nostril from base of nose to medial tragus   R corner of mouth to where ear lobe meets face   L corner of mouth to where ear lobe meets face         (Blank rows=not tested)    LOWER EXTREMITY MMT: unable to roll to prone to test hip extension  MMT Right eval  Hip flexion 3+/5  Hip extension   Hip abduction 3/5  Hip adduction   Hip internal rotation   Hip external rotation   Knee flexion 4+/5  Knee extension 5/5  Ankle dorsiflexion 4/5  Ankle plantarflexion   Ankle inversion   Ankle eversion    (Blank rows = not tested)  MMT LEFT eval  Hip flexion 3+/5  Hip extension   Hip abduction 3/5  Hip adduction   Hip internal rotation   Hip external rotation   Knee flexion 4+/5  Knee extension 5/5  Ankle dorsiflexion 4/5  Ankle plantarflexion   Ankle inversion   Ankle eversion    (Blank rows = not tested)  SINGLE LIMB STANCE: R 3 sec, L 1 sec  CURRENT/PAST TREATMENTS:  Surgery type/date: 09/26/24- R neck dissection  Chemotherapy: not required   Radiation:completed 12/08/24  OTHER SYMPTOMS: Pain Yes Fibrosis No Pitting edema No Infections No Decreased scar mobility Yes  PATIENT EDUCATION:  Education  details: need for exercise to help decrease fatigue, neck ROM exercises Person educated: Patient and Spouse Education method: Explanation Education comprehension: verbalized understanding  HOME EXERCISE PROGRAM: Reviewed previously given post op HEP.   ASSESSMENT:  CLINICAL IMPRESSION: Pt completed radiation for treatment of secondary squarmous cell carcinoma. She previously underwent R neck dissection in October 2025. She is sedentary and reports being sedentary for about 4 years. She has increased LE weakness especially bilateral hips. Her SLS was 3 sec which puts her at an increase fall risk. Her neck ROM is the same as baseline and she has been doing scar massage. She reports increased fatigue and has been unable to eat much and has lost about 30 lbs. She requires CGA when walking for safely. Pt would benefit from skilled PT services to improve bilateral LE strength, imporve balance to decrease fall risk. She would also benefit from scar massage to R neck dissection scar.   Pt will benefit from skilled therapeutic intervention to improve on the following deficits: decreased knowledge of condition, decreased knowledge of use of DME, decreased mobility, difficulty walking, decreased ROM, decreased strength, postural dysfunction, and pain  PT treatment/interventions: ADL/Self care home management, [phone]- PT Re-evaluation, 97110-Therapeutic exercises, 97530- Therapeutic activity, [id number]- Neuromuscular re-education, 97535- Self Care, [id number]- Manual therapy, [id number]- Orthotic Initial, [id number]- Orthotic/Prosthetic subsequent, and Patient/Family education     GOALS: Goals reviewed with patient? Yes  GOALS MET AT EVAL:   Name Target Date  Goal status  1 Patient will be able to verbalize understanding of a home exercise program for cervical range of motion, posture, and walking.   Baseline:  No knowledge Eval Achieved at eval  2 Patient will be able to verbalize understanding of proper sitting and  standing posture. Baseline:  No knowledge Eval Achieved at eval  3 Patient will be able to verbalize understanding of lymphedema risk and availability of treatment for this condition Baseline:  No knowledge Eval Achieved at eval     LONG TERM GOALS:  (STG=LTG)  GOALS Name Target Date  Goal status  1 Pt will demonstrate a return to baseline cervical ROM measurements and not demonstrate any  signs or symptoms of lymphedema. Baseline: 01/01/25 MET  2 Pt will demonstrate 4+/5 bilateral hip flexion strength to decrease fall risk and allow her to get in bed without her husband lifting her legs. 01/29/25 NEW  3 Pt will be able to ambulate independently without CGA for safely and stability. 01/29/25 NEW  4 Pt will be able to perform single limb stance for at least 15 sec on bilateral LEs to decrease fall risk. 01/29/25  NEW  5 Pt will be independent in a home exercise program for continued stretching and strengthening.  01/29/25 NEW       PLAN:  PT FREQUENCY/DURATION: 2x/wk for 4 wks  PLAN FOR NEXT SESSION: LE strengthening, NuStep, begin with seated and supine exercises initially due to increased fatigue, scar massage neck dissection scar   [facility]  [address], [address]  [place] [address]  [phone]   Scar massage You can begin gentle scar massage to you incision sites. Gently place one hand on the incision and move the skin (without sliding on the skin) in various directions. Do this for a few minutes and then you can gently massage either coconut oil or vitamin E cream into the scars.  Home exercise Program Continue doing the exercises you were given until you feel like you can do them without feeling any tightness at the end. It is best to do them for several months after completion of radiation since the effects of radiation continue past completion.   Walking Program Studies show that 30 minutes of walking per day (fast enough to elevate your heart  rate) can significantly reduce the risk of a cancer recurrence. If you can't walk due to other medical reasons, we encourage you to find another activity you could do (like a stationary bike or water exercise).  Posture After treatment for head and neck cancer, people frequently sit with rounded shoulders and forward head posture because the front of the neck has become tight and it feels better. If you sit like this, you can become very tight and have pain in sitting or standing with good posture. Try to be aware of your posture and sit and stand up tall to heal properly.  Follow up PT: Please let you doctor know as soon as possible if you develop any swelling in your face or neck in the future. Lymphedema (swelling) can occur months after completion of radiation. The sooner you can let the doctor know, the sooner they can refer you back to PT. It is much easier to treat the swelling early on.   [facility], PT 01/01/2025, 11:52 AM  "

## 2025-01-01 NOTE — Patient Instructions (Signed)
   Signs of Aspiration Pneumonia   Chest pain/tightness Fever (can be low grade) Cough  With foul-smelling phlegm (sputum) With sputum containing pus or blood With greenish sputum Fatigue  Shortness of breath  Wheezing   **IF YOU HAVE THESE SIGNS, CONTACT YOUR DOCTOR OR GO TO THE EMERGENCY DEPARTMENT OR URGENT CARE AS SOON AS POSSIBLE**

## 2025-01-07 ENCOUNTER — Inpatient Hospital Stay: Admitting: Dietician

## 2025-01-07 ENCOUNTER — Encounter: Payer: Self-pay | Admitting: Physical Therapy

## 2025-01-07 ENCOUNTER — Ambulatory Visit: Admitting: Physical Therapy

## 2025-01-07 ENCOUNTER — Telehealth: Payer: Self-pay | Admitting: Dietician

## 2025-01-07 DIAGNOSIS — R293 Abnormal posture: Secondary | ICD-10-CM

## 2025-01-07 DIAGNOSIS — L599 Disorder of the skin and subcutaneous tissue related to radiation, unspecified: Secondary | ICD-10-CM

## 2025-01-07 DIAGNOSIS — C4492 Squamous cell carcinoma of skin, unspecified: Secondary | ICD-10-CM

## 2025-01-07 DIAGNOSIS — R262 Difficulty in walking, not elsewhere classified: Secondary | ICD-10-CM

## 2025-01-07 DIAGNOSIS — M6281 Muscle weakness (generalized): Secondary | ICD-10-CM

## 2025-01-07 NOTE — Telephone Encounter (Signed)
 Attempted to reach patient via telephone for scheduled nutrition follow-up. Both patient and patients husband phone unable to accept calls at this time. Suspect this is related to current [employer] outage. Will r/s telephone visit.

## 2025-01-07 NOTE — Therapy (Signed)
 " OUTPATIENT PHYSICAL THERAPY HEAD AND NECK POST RADIATION FOLLOW UP   Patient Name: Diane Mayo MRN: [id number] DOB:Mar 01, 1951, 74 y.o., female Today's Date: 01/07/2025  END OF SESSION:  PT End of Session - 01/07/25 0904     Visit Number 3    Number of Visits 10    Date for Recertification  01/29/25    PT Start Time 0902    PT Stop Time 0954    PT Time Calculation (min) 52 min    Activity Tolerance Patient tolerated treatment well    Behavior During Therapy WFL for tasks assessed/performed          Past Medical History:  Diagnosis Date   Anemia    Diabetes mellitus without complication (HCC)    Type 2   Family history of adverse reaction to anesthesia    mother slow to wake up   Genital herpes    GERD (gastroesophageal reflux disease)    Hypertension    Interstitial cystitis    Left bundle branch block (LBBB)    chronic   Leukocytosis    NASH (nonalcoholic steatohepatitis)    Osteoarthritis    Plantar fasciitis    Past Surgical History:  Procedure Laterality Date   BACK SURGERY     L3 L4   CARPAL TUNNEL RELEASE Bilateral    CESAREAN SECTION     twice 1978 and 1987   CHOLECYSTECTOMY     DILATION AND CURETTAGE OF UTERUS  1975   DIRECT LARYNGOSCOPY N/A 09/26/2024   Procedure: LARYNGOSCOPY, DIRECT;  Surgeon: Tobie Eldora NOVAK, MD;  Location: [facility] OR;  Service: ENT;  Laterality: N/A;   EYE SURGERY Bilateral 2021   cataracts removed   RADICAL NECK DISSECTION Right 09/26/2024   Procedure: DISSECTION, NECK, RADICAL;  Surgeon: Tobie Eldora NOVAK, MD;  Location: [facility] OR;  Service: ENT;  Laterality: Right;   SHOULDER SURGERY Right 2012   rotator cuff repair   TONSILLECTOMY Bilateral 09/26/2024   Procedure: TONSILLECTOMY;  Surgeon: Tobie Eldora NOVAK, MD;  Location: [facility] OR;  Service: ENT;  Laterality: Bilateral;   TUBAL LIGATION  1987   Patient Active Problem List   Diagnosis Date Noted   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)  08/27/2024   Hypertriglyceridemia 11/26/2023   Acute respiratory failure with hypoxia (HCC) 02/04/2022   Acute pulmonary edema (HCC)    Sepsis (HCC)    Spinal stenosis of lumbar region 02/01/2022   Essential hypertension 07/08/2019   Candidal vulvovaginitis 01/25/2019   Diabetes mellitus (HCC) 01/25/2019   Menopausal symptom 01/25/2019   Obesity 01/25/2019   LBBB (left bundle branch block) 01/10/2018   Acute cystitis with hematuria 12/10/2014   Burning with urination 11/11/2014   Frequency of urination 08/13/2013   Chronic interstitial cystitis 08/07/2012    PCP: Arnett Repress, MD   REFERRING PROVIDER: Dr. 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 I am still not able to each. I can eat a popcicle or some yogurt.   PERTINENT HISTORY:  SCC metastatic to right cervical lymph node, unknown primary, stage IVA (T0 N2 M0 p 16 +. She presented to her PCP in late January of 2025 with concerns over a mass on her right neck. She underwent a head and neck US  on 03/18/24 showing a 4.5 cm complex cystic mass in or adjacent to the right parotid gland. To further investigate her symptoms she then underwent a neck CT on 04/09/24 showing a  right level 2A there is a predominantly cystic lesion measuring up to  3.2 cm in greatest axial dimension.   07/16/24 She saw Dr. Tobie for a consult regarding her mass. She then underwent a fine needle aspiration biopsy of right parotid with pathology indicating squamous cell carcinoma. Immunohistochemical stain for p16 was performed. The stain show increased staining in the basal layer of the squamous epithelium but overall it is patchy. There is no definitive evidence of block like pattern of staining. The finding does not support a HPV related squamous cell carcinoma... The possibility of squamous cell carcinoma arising from a branchial cleft cyst is considered. However, a metastatic squamous cell carcinoma cannot be excluded. 08/15/24 PET showed a right-sided neck mass demonstrates low level heterogeneous peripheral FDG accumulation with SUV of 4.3 with no other hypermetabolic lymphadenopathy in the neck. No evidence for hypermetabolic metastatic disease in the chest, abdomen, or pelvis. 08/14/24 follow up with Dr. Tobie the patient opted to proceed with bilateral tonsillectomy and radical neck dissection (1/20 nodes) 09/26/24 (delayed due to a grandchild's wedding). She will receive 30 fractions of radiation to her Oropharynx and bilateral neck which started on 10/27/24 and complete 12/08/24. 09/26/24 Bilateral tonsillectomy with biopsies. Hx of back surgery L3/L4  PATIENT GOALS:  Reassess how my recovery is going related to neck ROM, cervical pain, fatigue, and swelling.  PAIN:  Are you having pain? Yes NPRS scale: 3/10 Pain location: throat Pain orientation: Right and Left  PAIN TYPE: sore Pain description: intermittent  Aggravating factors: when swallowing, breathing hard, or eat Relieving factors: drinking warm liquids  PRECAUTIONS: Recent radiation, Head and neck lymphedema risk,    OBJECTIVE:   POSTURE:  Forward head and rounded shoulders posture  30 SEC SIT TO STAND: 01/01/25: 8 reps in 30 sec without use of UEs which is poor for pt's age At baseline eval  on 10/30/24: 10 reps in 30 sec without use of UEs which is  Below average for patient's age   SHOULDER AROM:   [facility]     CERVICAL AROM:     Percent limited baseline on 10/30/24 01/01/25  Flexion WFL WFL  Extension [facility] WFL  Right lateral flexion 25% limited WFL  Left lateral flexion 25% limited 25% limited  Right rotation [facility] WFL  Left rotation WFL WFL                          (Blank rows=not tested)    LYMPHEDEMA ASSESSMENT:    Circumference in cm  4 cm superior to sternal notch around neck 35.2  6 cm superior to sternal notch around neck 36.5  8 cm superior to sternal notch around neck 39.5  R lateral nostril from base of nose to medial tragus   L lateral nostril from base of nose to medial tragus   R corner of mouth to where ear lobe  meets face   L corner of mouth to where ear lobe meets face         (Blank rows=not tested)    LOWER EXTREMITY MMT: unable to roll to prone to test hip extension  MMT Right eval  Hip flexion 3+/5  Hip extension   Hip abduction 3/5  Hip adduction   Hip internal rotation   Hip external rotation   Knee flexion 4+/5  Knee extension 5/5  Ankle dorsiflexion 4/5  Ankle plantarflexion   Ankle inversion   Ankle eversion    (Blank rows = not tested)  MMT LEFT eval  Hip flexion 3+/5  Hip extension   Hip abduction 3/5  Hip adduction   Hip internal rotation   Hip external rotation   Knee flexion 4+/5  Knee extension 5/5  Ankle dorsiflexion 4/5  Ankle plantarflexion   Ankle inversion   Ankle eversion    (Blank rows = not tested)  SINGLE LIMB STANCE: R 3 sec, L 1 sec  CURRENT/PAST TREATMENTS:  Surgery type/date: 09/26/24- R neck dissection  Chemotherapy: not required   Radiation:completed 12/08/24  OTHER SYMPTOMS: Pain Yes Fibrosis No Pitting edema No Infections No Decreased scar mobility Yes  TREATMENT PERFORMED: 01/07/25: Nustep level 1 seat at 5, UEs at 5, x 10 min to warm up Seated edge of mat: Marching x 10 reps  with pt returning therapist demo LAQ with 5 sec holds x 10 reps Ball squeezes with 5 sec holds x 10 reps Hip abduction with yellow medium loop band x 10 reps Sit to stands x 10 reps with v/c for initial foot placement Supine on large mat table: Gentle over pressure to R anterior shoulder for pec stretch Lying arm outstretched on mat table at about 115 degrees of abduction for pec stretch with therpaist gnetly performing STM to R pec Supine scap with yellow band: narrow and wide grip with pt returing therapist demo x 10 reps each - v/c to keep scapular muscles engaged throughout  PATIENT EDUCATION:  Education details: need for exercise to help decrease fatigue, neck ROM exercises Person educated: Patient and Spouse Education method: Explanation Education comprehension: verbalized understanding  HOME EXERCISE PROGRAM: Reviewed previously given post op HEP. Access Code: [email or web address] URL: [email or web address] Date: 01/07/2025 Prepared by: Florina Lanis Carbon  Exercises - Seated Hip Adduction Isometrics with Mercer  - 1 x daily - 7 x weekly - 1-3 sets - 10 reps - 5 sec hold - Seated Hip Abduction with Resistance  - 1 x daily - 7 x weekly - 1-3 sets - 10 reps - 3 sec hold - Seated March  - 1 x daily - 7 x weekly - 3 sets - 10 reps - Seated Long Arc Quad  - 1 x daily - 7 x weekly - 3 sets - 10 reps - 5 sec hold - Sit to Stand Without Arm Support  - 1 x daily - 7 x weekly - 3 sets - 10 reps  ASSESSMENT:  CLINICAL IMPRESSION: Began LE strengthening exercises today. Pt did well with minimum fatigue. May add weights to seated exercises next session if pt did not have any long lasting muscle soreness. In supine pt's forward shoulder positioning on R was very apparent. Discussed with pt that this can happen after neck dissection surgery and it is importance ot create a balance to improve this by stretching R pec and strengthening R scapular muscles. Began instructing pt in supine scap  series today. Issued seated exercises as part of  HEP.   Pt will benefit from skilled therapeutic intervention to improve on the following deficits: decreased knowledge of condition, decreased knowledge of use of DME, decreased mobility, difficulty walking, decreased ROM, decreased strength, postural dysfunction, and pain  PT treatment/interventions: ADL/Self care home management, [id number]- PT Re-evaluation, 97110-Therapeutic exercises, 97530- Therapeutic activity, [id number]- Neuromuscular re-education, 97535- Self Care, [id number]- Manual therapy, [id number]- Orthotic Initial, [id number]- Orthotic/Prosthetic subsequent, and Patient/Family education     GOALS: Goals reviewed with patient? Yes  GOALS MET AT EVAL:   Name Target Date  Goal status  1 Patient will be able to verbalize understanding of a home exercise program for cervical range of motion, posture, and walking.   Baseline:  No knowledge Eval Achieved at eval  2 Patient will be able to verbalize understanding of proper sitting and standing posture. Baseline:  No knowledge Eval Achieved at eval  3 Patient will be able to verbalize understanding of lymphedema risk and availability of treatment for this condition Baseline:  No knowledge Eval Achieved at eval     LONG TERM GOALS:  (STG=LTG)  GOALS Name Target Date  Goal status  1 Pt will demonstrate a return to baseline cervical ROM measurements and not demonstrate any signs or symptoms of lymphedema. Baseline: 01/01/25 MET  2 Pt will demonstrate 4+/5 bilateral hip flexion strength to decrease fall risk and allow her to get in bed without her husband lifting her legs. 01/29/25 NEW  3 Pt will be able to ambulate independently without CGA for safely and stability. 01/29/25 NEW  4 Pt will be able to perform single limb stance for at least 15 sec on bilateral LEs to decrease fall risk. 01/29/25  NEW  5 Pt will be independent in a home exercise program for continued stretching and strengthening.  01/29/25 NEW        PLAN:  PT FREQUENCY/DURATION: 2x/wk for 4 wks  PLAN FOR NEXT SESSION: LE strengthening, NuStep, seated and supine exercises initially due to increased fatigue, work on improving R scapular strength and R pec stretches, scar massage neck dissection scar   [facility]  [address], [address]  [place] [address]  [phone]   Scar massage You can begin gentle scar massage to you incision sites. Gently place one hand on the incision and move the skin (without sliding on the skin) in various directions. Do this for a few minutes and then you can gently massage either coconut oil or vitamin E cream into the scars.  Home exercise Program Continue doing the exercises you were given until you feel like you can do them without feeling any tightness at the end. It is best to do them for several months after completion of radiation since the effects of radiation continue past completion.   Walking Program Studies show that 30 minutes of walking per day (fast enough to elevate your heart rate) can significantly reduce the risk of a cancer recurrence. If you can't walk due to other medical reasons, we encourage you to find another activity you could do (like a stationary bike or water exercise).  Posture After treatment for head and neck cancer, people frequently sit with rounded shoulders and forward head posture because the front of the neck has become tight and it feels better. If you sit like this, you can become very tight and have pain in sitting or standing with good posture. Try to be aware of your posture and sit and stand up tall to heal properly.  Follow  up PT: Please let you doctor know as soon as possible if you develop any swelling in your face or neck in the future. Lymphedema (swelling) can occur months after completion of radiation. The sooner you can let the doctor know, the sooner they can refer you back to PT. It is much easier to treat the  swelling early on.   [employer], PT 01/07/2025, 10:00 AM  "

## 2025-01-13 ENCOUNTER — Ambulatory Visit

## 2025-01-13 ENCOUNTER — Inpatient Hospital Stay: Attending: Radiation Oncology | Admitting: Dietician

## 2025-01-13 ENCOUNTER — Telehealth: Payer: Self-pay | Admitting: Dietician

## 2025-01-13 DIAGNOSIS — R262 Difficulty in walking, not elsewhere classified: Secondary | ICD-10-CM

## 2025-01-13 DIAGNOSIS — R293 Abnormal posture: Secondary | ICD-10-CM

## 2025-01-13 DIAGNOSIS — L599 Disorder of the skin and subcutaneous tissue related to radiation, unspecified: Secondary | ICD-10-CM

## 2025-01-13 DIAGNOSIS — C4492 Squamous cell carcinoma of skin, unspecified: Secondary | ICD-10-CM

## 2025-01-13 DIAGNOSIS — M6281 Muscle weakness (generalized): Secondary | ICD-10-CM

## 2025-01-13 NOTE — Progress Notes (Signed)
 Oncology Nurse Navigator Documentation    I received a VM from Diane Mayo's friend Diane Mayo. She is concerned about Diane Mayo because she continues to not intake much nutrition after completing her radiation 12/08/2024 for her head and neck cancer. I attempted to call her back and was only able to leave a VM with my direct contact information.   [name] RN, BSN, OCN Head & Neck Oncology Nurse Navigator [facility] Phone # [phone]  Fax # [phone]

## 2025-01-13 NOTE — Therapy (Signed)
 " OUTPATIENT PHYSICAL THERAPY HEAD AND NECK POST RADIATION FOLLOW UP   Patient Name: Diane Mayo MRN: [id number] DOB:[birth year], 74 y.o., female Today's Date: 01/13/2025  END OF SESSION:  PT End of Session - 01/13/25 1356     Visit Number 4    Number of Visits 10    Date for Recertification  01/29/25    PT Start Time 1358    PT Stop Time 1456    PT Time Calculation (min) 58 min    Activity Tolerance Patient tolerated treatment well    Behavior During Therapy WFL for tasks assessed/performed          Past Medical History:  Diagnosis Date   Anemia    Diabetes mellitus without complication (HCC)    Type 2   Family history of adverse reaction to anesthesia    mother slow to wake up   Genital herpes    GERD (gastroesophageal reflux disease)    Hypertension    Interstitial cystitis    Left bundle branch block (LBBB)    chronic   Leukocytosis    NASH (nonalcoholic steatohepatitis)    Osteoarthritis    Plantar fasciitis    Past Surgical History:  Procedure Laterality Date   BACK SURGERY     L3 L4   CARPAL TUNNEL RELEASE Bilateral    CESAREAN SECTION     twice 1978 and 1987   CHOLECYSTECTOMY     DILATION AND CURETTAGE OF UTERUS  1975   DIRECT LARYNGOSCOPY N/A 09/26/2024   Procedure: LARYNGOSCOPY, DIRECT;  Surgeon: Tobie Eldora NOVAK, MD;  Location: [facility] OR;  Service: ENT;  Laterality: N/A;   EYE SURGERY Bilateral 2021   cataracts removed   RADICAL NECK DISSECTION Right 09/26/2024   Procedure: DISSECTION, NECK, RADICAL;  Surgeon: Tobie Eldora NOVAK, MD;  Location: [facility] OR;  Service: ENT;  Laterality: Right;   SHOULDER SURGERY Right 2012   rotator cuff repair   TONSILLECTOMY Bilateral 09/26/2024   Procedure: TONSILLECTOMY;  Surgeon: Tobie Eldora NOVAK, MD;  Location: [facility] OR;  Service: ENT;  Laterality: Bilateral;   TUBAL LIGATION  1987   Patient Active Problem List   Diagnosis Date Noted   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)  08/27/2024   Hypertriglyceridemia 11/26/2023   Acute respiratory failure with hypoxia (HCC) 02/04/2022   Acute pulmonary edema (HCC)    Sepsis (HCC)    Spinal stenosis of lumbar region 02/01/2022   Essential hypertension 07/08/2019   Candidal vulvovaginitis 01/25/2019   Diabetes mellitus (HCC) 01/25/2019   Menopausal symptom 01/25/2019   Obesity 01/25/2019   LBBB (left bundle branch block) 01/10/2018   Acute cystitis with hematuria 12/10/2014   Burning with urination 11/11/2014   Frequency of urination 08/13/2013   Chronic interstitial cystitis 08/07/2012    PCP: Arnett Repress, MD   REFERRING PROVIDER: Dr. 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 Did OK after last visit with Blaire.  Not too fatigued. She has lost 45 lbs since the beginning.Pain in throat 4/10.     PERTINENT HISTORY:  SCC metastatic to right cervical lymph node, unknown primary, stage IVA (T0 N2 M0 p 16 +. She presented to her PCP in late January of 2025 with concerns over a mass on her right neck. She underwent a head and neck US  on 03/18/24 showing a 4.5 cm complex cystic mass in or adjacent to the right parotid gland. To further investigate her symptoms she then underwent a neck CT on 04/09/24 showing a  right  level 2A there is a predominantly cystic lesion measuring up to 3.2 cm in greatest axial dimension.   07/16/24 She saw Dr. Tobie for a consult regarding her mass. She then underwent a fine needle aspiration biopsy of right parotid with pathology indicating squamous cell carcinoma. Immunohistochemical stain for p16 was performed. The stain show increased staining in the basal layer of the squamous epithelium but overall it is patchy. There is no definitive evidence of block like pattern of staining. The finding does not support a HPV related squamous cell carcinoma... The possibility of squamous cell carcinoma arising from a branchial cleft cyst is considered. However, a metastatic squamous cell carcinoma cannot be excluded. 08/15/24 PET showed a right-sided neck mass demonstrates low level heterogeneous peripheral FDG accumulation with SUV of 4.3 with no other hypermetabolic lymphadenopathy in the neck. No evidence for hypermetabolic metastatic disease in the chest, abdomen, or pelvis. 08/14/24 follow up with Dr. Tobie the patient opted to proceed with bilateral tonsillectomy and radical neck dissection (1/20 nodes) 09/26/24 (delayed due to a grandchild's wedding). She will receive 30 fractions of radiation to her Oropharynx and bilateral neck which started on 10/27/24 and complete 12/08/24. 09/26/24 Bilateral tonsillectomy with biopsies. Hx of back surgery L3/L4  PATIENT GOALS:  Reassess how my recovery is going related to neck ROM, cervical pain, fatigue, and swelling.  PAIN:  Are you having pain? Yes NPRS scale: 4/10 Pain location: throat Pain orientation: Right and Left  PAIN TYPE: sore Pain description: intermittent  Aggravating factors: when swallowing, breathing hard, or eat Relieving factors: drinking warm liquids  PRECAUTIONS: Recent radiation, Head and neck lymphedema risk,    OBJECTIVE:   POSTURE:  Forward head and rounded shoulders posture  30 SEC SIT TO STAND: 01/01/25: 8 reps in 30 sec  without use of UEs which is poor for pt's age At baseline eval on 10/30/24: 10 reps in 30 sec without use of UEs which is  Below average for patient's age   SHOULDER AROM:   [facility]     CERVICAL AROM:     Percent limited baseline on 10/30/24 01/01/25  Flexion WFL WFL  Extension [facility] WFL  Right lateral flexion 25% limited WFL  Left lateral flexion 25% limited 25% limited  Right rotation [facility] WFL  Left rotation WFL WFL                          (Blank rows=not tested)    LYMPHEDEMA ASSESSMENT:    Circumference in cm  4 cm superior to sternal notch around neck 35.2  6 cm superior to sternal notch around neck 36.5  8 cm superior to sternal notch around neck 39.5  R lateral nostril from base of nose to medial tragus   L lateral nostril from base of nose to  medial tragus   R corner of mouth to where ear lobe meets face   L corner of mouth to where ear lobe meets face         (Blank rows=not tested)    LOWER EXTREMITY MMT: unable to roll to prone to test hip extension  MMT Right eval  Hip flexion 3+/5  Hip extension   Hip abduction 3/5  Hip adduction   Hip internal rotation   Hip external rotation   Knee flexion 4+/5  Knee extension 5/5  Ankle dorsiflexion 4/5  Ankle plantarflexion   Ankle inversion   Ankle eversion    (Blank rows = not tested)  MMT LEFT eval  Hip flexion 3+/5  Hip extension   Hip abduction 3/5  Hip adduction   Hip internal rotation   Hip external rotation   Knee flexion 4+/5  Knee extension 5/5  Ankle dorsiflexion 4/5  Ankle plantarflexion   Ankle inversion   Ankle eversion    (Blank rows = not tested)  SINGLE LIMB STANCE: R 3 sec, L 1 sec  CURRENT/PAST TREATMENTS:  Surgery type/date: 09/26/24- R neck dissection  Chemotherapy: not required   Radiation:completed 12/08/24  OTHER SYMPTOMS: Pain Yes Fibrosis No Pitting edema No Infections No Decreased scar mobility Yes  TREATMENT PERFORMED: 01/13/2025 Discussed progress and pt/husband  concerns Messaged Dr. Izell for pt. Nustep level 1 seat at 5, UEs at 5, x 10 min to warm up Marching 1 #, x 10, x 5 reps reps with pt returning therapist demo LAQ with 5 sec holds  1# 2 x 10 reps, Ball squeezes with 5 sec holds x 10 reps, x 5 reps Sit to stands x 3 x 5 reps without hands  Hip abduction with yellow band x 10 reps Educated in sit to and from supine on mat table Supine scapular series with yellow, narrow and wide grip flexion x 10 ea Supine Horizontal abd x 10 ea yellow Snow angels x 5 ea 01/07/25: Nustep level 1 seat at 5, UEs at 5, x 10 min to warm up Seated edge of mat: Marching x 10 reps with pt returning therapist demo LAQ with 5 sec holds x 10 reps Ball squeezes with 5 sec holds x 10 reps Hip abduction with yellow medium loop band x 10 reps Sit to stands x 10 reps with v/c for initial foot placement Supine on large mat table: Gentle over pressure to R anterior shoulder for pec stretch Lying arm outstretched on mat table at about 115 degrees of abduction for pec stretch with therpaist gnetly performing STM to R pec Supine scap with yellow band: narrow and wide grip with pt returing therapist demo x 10 reps each - v/c to keep scapular muscles engaged throughout  PATIENT EDUCATION:  Education details: need for exercise to help decrease fatigue, neck ROM exercises Person educated: Patient and Spouse Education method: Explanation Education comprehension: verbalized understanding  HOME EXERCISE PROGRAM: Reviewed previously given post op HEP. Access Code: [email or web address] URL: [email or web address] Date: 01/07/2025 Prepared by: Florina Lanis Carbon  Exercises - Seated Hip Adduction Isometrics with Mercer  - 1 x daily - 7 x weekly - 1-3 sets - 10 reps - 5 sec hold - Seated Hip Abduction with Resistance  - 1 x daily - 7 x weekly - 1-3 sets - 10 reps - 3 sec hold - Seated March  - 1 x daily - 7 x weekly - 3 sets - 10 reps - Seated Long Arc Quad  - 1  x daily -  7 x weekly - 3 sets - 10 reps - 5 sec hold - Sit to Stand Without Arm Support  - 1 x daily - 7 x weekly - 3 sets - 10 reps  ASSESSMENT:  CLINICAL IMPRESSION:  Pt was able to increase repetitions and resistance on selected exercises and felt a little fatigued after session from working harder, but not too much so. She used good form with very few VC's required. Pt/husband very concerned that pain is not improving and she is not able to eat or drink well.  Pt will benefit from skilled therapeutic intervention to improve on the following deficits: decreased knowledge of condition, decreased knowledge of use of DME, decreased mobility, difficulty walking, decreased ROM, decreased strength, postural dysfunction, and pain  PT treatment/interventions: ADL/Self care home management, [phone]- PT Re-evaluation, 97110-Therapeutic exercises, 97530- Therapeutic activity, [id number]- Neuromuscular re-education, 97535- Self Care, [id number]- Manual therapy, [id number]- Orthotic Initial, [id number]- Orthotic/Prosthetic subsequent, and Patient/Family education     GOALS: Goals reviewed with patient? Yes  GOALS MET AT EVAL:   Name Target Date  Goal status  1 Patient will be able to verbalize understanding of a home exercise program for cervical range of motion, posture, and walking.   Baseline:  No knowledge Eval Achieved at eval  2 Patient will be able to verbalize understanding of proper sitting and standing posture. Baseline:  No knowledge Eval Achieved at eval  3 Patient will be able to verbalize understanding of lymphedema risk and availability of treatment for this condition Baseline:  No knowledge Eval Achieved at eval     LONG TERM GOALS:  (STG=LTG)  GOALS Name Target Date  Goal status  1 Pt will demonstrate a return to baseline cervical ROM measurements and not demonstrate any signs or symptoms of lymphedema. Baseline: 01/01/25 MET  2 Pt will demonstrate 4+/5 bilateral hip flexion strength to decrease fall risk  and allow her to get in bed without her husband lifting her legs. 01/29/25 NEW  3 Pt will be able to ambulate independently without CGA for safely and stability. 01/29/25 NEW  4 Pt will be able to perform single limb stance for at least 15 sec on bilateral LEs to decrease fall risk. 01/29/25  NEW  5 Pt will be independent in a home exercise program for continued stretching and strengthening.  01/29/25 NEW       PLAN:  PT FREQUENCY/DURATION: 2x/wk for 4 wks  PLAN FOR NEXT SESSION: LE strengthening, NuStep, seated and supine exercises initially due to increased fatigue, work on improving R scapular strength and R pec stretches, scar massage neck dissection scar   [facility]  [address], [address]  [place] [address]  [phone]   Scar massage You can begin gentle scar massage to you incision sites. Gently place one hand on the incision and move the skin (without sliding on the skin) in various directions. Do this for a few minutes and then you can gently massage either coconut oil or vitamin E cream into the scars.  Home exercise Program Continue doing the exercises you were given until you feel like you can do them without feeling any tightness at the end. It is best to do them for several months after completion of radiation since the effects of radiation continue past completion.   Walking Program Studies show that 30 minutes of walking per day (fast enough to elevate your heart rate) can significantly reduce the risk of a cancer recurrence.  If you can't walk due to other medical reasons, we encourage you to find another activity you could do (like a stationary bike or water exercise).  Posture After treatment for head and neck cancer, people frequently sit with rounded shoulders and forward head posture because the front of the neck has become tight and it feels better. If you sit like this, you can become very tight and have pain in sitting or standing with  good posture. Try to be aware of your posture and sit and stand up tall to heal properly.  Follow up PT: Please let you doctor know as soon as possible if you develop any swelling in your face or neck in the future. Lymphedema (swelling) can occur months after completion of radiation. The sooner you can let the doctor know, the sooner they can refer you back to PT. It is much easier to treat the swelling early on.   Grayce JINNY Sheldon, PT 01/13/2025, 2:57 PM  "

## 2025-01-13 NOTE — Telephone Encounter (Signed)
 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 She completed 30 fractions of radiation therapy. S/p b/L tonsillectomy 10/3. Last radiation on 12/15   Called patient for nutrition follow-up. Close family friend answered the phone. Says patient having difficulties with talking due to sore throat. Friend has been staying with patient for the last week. Reports patient continues to struggle with intake. She takes sips of water, guessing 8-12 ounces total fluids/day. Patient might get 5 tiny bites of food in. Friend is asking if IV nutrition is a possibility.    Medications: reviewed   Labs: no new labs for review  Anthropometrics: 165 lb on home scale today per pt report   1/7 - 178 lb 3.2 oz 12/23 - 186 lb  12/2 - 191 lb 12 oz   Estimated Energy Needs  Kcals: 2025-2400 Protein: 97-121 Fluid: >2 L  NUTRITION DIAGNOSIS: Food and nutrition related knowledge deficit ongoing    MALNUTRITION DIAGNOSIS: Given continued wt loss, suspect degree of malnutrition, however unable to identify at this time (telephone visit)   INTERVENTION:  Explained that PEG placement would be favored over initiating TPN unless contraindicated  Discussed with nurse navigator who agrees pt should present to ED for further evaluation If PEG placed she will be at high risk for refeeding. Close monitoring of labs and IV repletion as indicated would be best provided inpatient Nurse navigator to call and discuss recommendations with patient     MONITORING, EVALUATION, GOAL: wt trends, intake   NEXT VISIT: To be determined

## 2025-01-14 ENCOUNTER — Encounter: Payer: Self-pay | Admitting: Radiation Oncology

## 2025-01-14 ENCOUNTER — Other Ambulatory Visit: Payer: Self-pay

## 2025-01-14 ENCOUNTER — Encounter (HOSPITAL_COMMUNITY): Payer: Self-pay

## 2025-01-14 ENCOUNTER — Encounter (HOSPITAL_COMMUNITY): Payer: Self-pay | Admitting: Internal Medicine

## 2025-01-14 ENCOUNTER — Inpatient Hospital Stay (HOSPITAL_COMMUNITY)
Admission: AD | Admit: 2025-01-14 | Discharge: 2025-01-17 | DRG: 640 | Disposition: A | Discharge status: Home / Self Care | Source: Ambulatory Visit | Attending: Internal Medicine | Admitting: Internal Medicine

## 2025-01-14 DIAGNOSIS — E66811 Obesity, class 1: Secondary | ICD-10-CM | POA: Diagnosis present

## 2025-01-14 DIAGNOSIS — Z823 Family history of stroke: Secondary | ICD-10-CM

## 2025-01-14 DIAGNOSIS — R1310 Dysphagia, unspecified: Secondary | ICD-10-CM | POA: Diagnosis present

## 2025-01-14 DIAGNOSIS — Z87891 Personal history of nicotine dependence: Secondary | ICD-10-CM

## 2025-01-14 DIAGNOSIS — M1990 Unspecified osteoarthritis, unspecified site: Secondary | ICD-10-CM | POA: Diagnosis present

## 2025-01-14 DIAGNOSIS — C770 Secondary and unspecified malignant neoplasm of lymph nodes of head, face and neck: Secondary | ICD-10-CM | POA: Diagnosis present

## 2025-01-14 DIAGNOSIS — Z8249 Family history of ischemic heart disease and other diseases of the circulatory system: Secondary | ICD-10-CM

## 2025-01-14 DIAGNOSIS — C801 Malignant (primary) neoplasm, unspecified: Secondary | ICD-10-CM | POA: Diagnosis present

## 2025-01-14 DIAGNOSIS — Z6832 Body mass index (BMI) 32.0-32.9, adult: Secondary | ICD-10-CM | POA: Diagnosis not present

## 2025-01-14 DIAGNOSIS — F32A Depression, unspecified: Secondary | ICD-10-CM | POA: Diagnosis present

## 2025-01-14 DIAGNOSIS — Z880 Allergy status to penicillin: Secondary | ICD-10-CM

## 2025-01-14 DIAGNOSIS — R633 Feeding difficulties, unspecified: Secondary | ICD-10-CM | POA: Diagnosis present

## 2025-01-14 DIAGNOSIS — E878 Other disorders of electrolyte and fluid balance, not elsewhere classified: Secondary | ICD-10-CM

## 2025-01-14 DIAGNOSIS — Z808 Family history of malignant neoplasm of other organs or systems: Secondary | ICD-10-CM

## 2025-01-14 DIAGNOSIS — K7581 Nonalcoholic steatohepatitis (NASH): Secondary | ICD-10-CM | POA: Diagnosis present

## 2025-01-14 DIAGNOSIS — Z7984 Long term (current) use of oral hypoglycemic drugs: Secondary | ICD-10-CM

## 2025-01-14 DIAGNOSIS — E876 Hypokalemia: Secondary | ICD-10-CM | POA: Diagnosis present

## 2025-01-14 DIAGNOSIS — Z8419 Family history of other disorders of kidney and ureter: Secondary | ICD-10-CM

## 2025-01-14 DIAGNOSIS — Z825 Family history of asthma and other chronic lower respiratory diseases: Secondary | ICD-10-CM

## 2025-01-14 DIAGNOSIS — C7989 Secondary malignant neoplasm of other specified sites: Secondary | ICD-10-CM | POA: Diagnosis not present

## 2025-01-14 DIAGNOSIS — R627 Adult failure to thrive: Principal | ICD-10-CM | POA: Diagnosis present

## 2025-01-14 DIAGNOSIS — Z9049 Acquired absence of other specified parts of digestive tract: Secondary | ICD-10-CM

## 2025-01-14 DIAGNOSIS — Z79899 Other long term (current) drug therapy: Secondary | ICD-10-CM

## 2025-01-14 DIAGNOSIS — I10 Essential (primary) hypertension: Secondary | ICD-10-CM | POA: Diagnosis present

## 2025-01-14 DIAGNOSIS — E43 Unspecified severe protein-calorie malnutrition: Secondary | ICD-10-CM | POA: Diagnosis present

## 2025-01-14 DIAGNOSIS — M109 Gout, unspecified: Secondary | ICD-10-CM | POA: Diagnosis present

## 2025-01-14 DIAGNOSIS — Z6372 Alcoholism and drug addiction in family: Secondary | ICD-10-CM

## 2025-01-14 DIAGNOSIS — E785 Hyperlipidemia, unspecified: Secondary | ICD-10-CM | POA: Diagnosis present

## 2025-01-14 DIAGNOSIS — Z888 Allergy status to other drugs, medicaments and biological substances status: Secondary | ICD-10-CM

## 2025-01-14 DIAGNOSIS — E119 Type 2 diabetes mellitus without complications: Secondary | ICD-10-CM | POA: Diagnosis present

## 2025-01-14 DIAGNOSIS — Z923 Personal history of irradiation: Secondary | ICD-10-CM | POA: Diagnosis not present

## 2025-01-14 DIAGNOSIS — Z833 Family history of diabetes mellitus: Secondary | ICD-10-CM

## 2025-01-14 DIAGNOSIS — Z811 Family history of alcohol abuse and dependence: Secondary | ICD-10-CM

## 2025-01-14 DIAGNOSIS — C4492 Squamous cell carcinoma of skin, unspecified: Secondary | ICD-10-CM | POA: Diagnosis not present

## 2025-01-14 LAB — CBC WITH DIFFERENTIAL/PLATELET
Abs Immature Granulocytes: 0.01 K/uL (ref 0.00–0.07)
Basophils Absolute: 0.1 K/uL (ref 0.0–0.1)
Basophils Relative: 1 %
Eosinophils Absolute: 0 K/uL (ref 0.0–0.5)
Eosinophils Relative: 1 %
HCT: 38.4 % (ref 36.0–46.0)
Hemoglobin: 11.8 g/dL — ABNORMAL LOW (ref 12.0–15.0)
Immature Granulocytes: 0 %
Lymphocytes Relative: 20 %
Lymphs Abs: 1 K/uL (ref 0.7–4.0)
MCH: 23.9 pg — ABNORMAL LOW (ref 26.0–34.0)
MCHC: 30.7 g/dL (ref 30.0–36.0)
MCV: 77.9 fL — ABNORMAL LOW (ref 80.0–100.0)
Monocytes Absolute: 0.6 K/uL (ref 0.1–1.0)
Monocytes Relative: 12 %
Neutro Abs: 3.4 K/uL (ref 1.7–7.7)
Neutrophils Relative %: 66 %
Platelets: 209 K/uL (ref 150–400)
RBC: 4.93 MIL/uL (ref 3.87–5.11)
RDW: 15.6 % — ABNORMAL HIGH (ref 11.5–15.5)
WBC: 5.1 K/uL (ref 4.0–10.5)
nRBC: 0 % (ref 0.0–0.2)

## 2025-01-14 LAB — COMPREHENSIVE METABOLIC PANEL WITH GFR
ALT: 12 U/L (ref 0–44)
AST: 26 U/L (ref 15–41)
Albumin: 3.7 g/dL (ref 3.5–5.0)
Alkaline Phosphatase: 80 U/L (ref 38–126)
Anion gap: 16 — ABNORMAL HIGH (ref 5–15)
BUN: 7 mg/dL — ABNORMAL LOW (ref 8–23)
CO2: 24 mmol/L (ref 22–32)
Calcium: 9.4 mg/dL (ref 8.9–10.3)
Chloride: 97 mmol/L — ABNORMAL LOW (ref 98–111)
Creatinine, Ser: 0.61 mg/dL (ref 0.44–1.00)
GFR, Estimated: >60 mL/min
Glucose, Bld: 105 mg/dL — ABNORMAL HIGH (ref 70–99)
Potassium: 2.7 mmol/L — CL (ref 3.5–5.1)
Sodium: 137 mmol/L (ref 135–145)
Total Bilirubin: 0.7 mg/dL (ref 0.0–1.2)
Total Protein: 6.7 g/dL (ref 6.5–8.1)

## 2025-01-14 LAB — PROTIME-INR
INR: 1 (ref 0.8–1.2)
Prothrombin Time: 13.9 s (ref 11.4–15.2)

## 2025-01-14 LAB — MAGNESIUM: Magnesium: 1.6 mg/dL — ABNORMAL LOW (ref 1.7–2.4)

## 2025-01-14 LAB — TSH: TSH: 2.55 u[IU]/mL (ref 0.350–4.500)

## 2025-01-14 LAB — GLUCOSE, CAPILLARY: Glucose-Capillary: 104 mg/dL — ABNORMAL HIGH (ref 70–99)

## 2025-01-14 LAB — PHOSPHORUS: Phosphorus: 3.1 mg/dL (ref 2.5–4.6)

## 2025-01-14 MED ORDER — PROMETHAZINE HCL 25 MG PO TABS: 12.5000 mg | ORAL_TABLET | Freq: Four times a day (QID) | ORAL | Status: DC | PRN

## 2025-01-14 MED ORDER — INSULIN ASPART 100 UNIT/ML IJ SOLN: 0.0000 [IU] | Freq: Three times a day (TID) | INTRAMUSCULAR | Status: DC

## 2025-01-14 MED ORDER — ACETAMINOPHEN 325 MG PO TABS
650.0000 mg | ORAL_TABLET | Freq: Four times a day (QID) | ORAL | Status: DC | PRN
  Administered 2025-01-17: 650 mg via ORAL
  Filled 2025-01-14: qty 2

## 2025-01-14 MED ORDER — LORAZEPAM 0.5 MG PO TABS: 0.5000 mg | ORAL_TABLET | Freq: Three times a day (TID) | ORAL | Status: DC | PRN

## 2025-01-14 MED ORDER — POTASSIUM CHLORIDE CRYS ER 20 MEQ PO TBCR
20.0000 meq | EXTENDED_RELEASE_TABLET | Freq: Two times a day (BID) | ORAL | Status: AC
  Administered 2025-01-15 – 2025-01-16 (×2): 20 meq via ORAL
  Filled 2025-01-14 (×2): qty 1

## 2025-01-14 MED ORDER — POTASSIUM CHLORIDE 10 MEQ/100ML IV SOLN
10.0000 meq | INTRAVENOUS | Status: AC
  Administered 2025-01-14 – 2025-01-15 (×6): 10 meq via INTRAVENOUS
  Filled 2025-01-14 (×2): qty 100

## 2025-01-14 MED ORDER — POTASSIUM CHLORIDE 10 MEQ/100ML IV SOLN: 10.0000 meq | INTRAVENOUS | Status: DC

## 2025-01-14 MED ORDER — ACETAMINOPHEN 650 MG RE SUPP: 650.0000 mg | Freq: Four times a day (QID) | RECTAL | Status: DC | PRN

## 2025-01-14 MED ORDER — SODIUM CHLORIDE 0.9 % IV SOLN: INTRAVENOUS | Status: DC

## 2025-01-14 MED ORDER — NYSTATIN 100000 UNIT/ML MT SUSP
5.0000 mL | Freq: Four times a day (QID) | OROMUCOSAL | Status: DC
  Administered 2025-01-14 – 2025-01-17 (×11): 500000 [IU] via ORAL
  Filled 2025-01-14 (×10): qty 5

## 2025-01-14 MED ORDER — POTASSIUM CHLORIDE CRYS ER 20 MEQ PO TBCR: 40.0000 meq | EXTENDED_RELEASE_TABLET | Freq: Once | ORAL | Status: DC

## 2025-01-14 MED ORDER — MAGNESIUM SULFATE 2 GM/50ML IV SOLN
2.0000 g | Freq: Once | INTRAVENOUS | Status: AC
  Administered 2025-01-14: 2 g via INTRAVENOUS
  Filled 2025-01-14: qty 50

## 2025-01-14 NOTE — Progress Notes (Signed)
 Given the patient's difficulty with taking in nutrition I consider that she is suffering from failure to thrive. I spoke with Dr. Celinda and he will directly admit our patient today for feeding tube placement and monitoring for refeeding syndrome. Admissions will contact her once they have a bed she can wait at home until then.  I asked our head and neck navigator to contact the patient to keep her aware of the plan. -----------------------------------  Lauraine Golden, MD

## 2025-01-14 NOTE — H&P (Signed)
 " History and Physical    Diane Mayo FMW:[id number] DOB: 1951/03/18 DOA: 01/14/2025  PCP: Sun, Vyvyan, MD  Patient coming from: Home as direct admit  I have personally briefly reviewed patient's old medical records available.   Chief Complaint: Unable to eat, it hurts to eat, becoming weak.  HPI: Diane Mayo is a 74 y.o. 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  Patient seen in the hospital bed with husband at the bedside.  Patient reports progressive difficulty swallowing, poor appetite, pain on the right side of the neck with each swallow.  At rest she has no other issues, however every time she tries to drink or eat it hurts on her neck.  Patient has lately been having very poor taste in her mouth.  Denies any nausea vomiting.  In last 24 hours, she only had few sips of soup and some ice cream.  She has been seen by nutrition as outpatient.  Patient herself denies any other complaints on my interview. ED Course: Examined at the bedside, hemodynamically stable.  Lab tests are ordered.  Recent lab test from 2 weeks ago with low potassium.  Patient looks overall stable on exam. Complains of poor voice, sometimes it hurts to speak.  She denies any shortness of breath or chest pain. Since patient lost her appetite, her primary care doctor advised her to stay off all medications. Her urination is normal. Her bowel habits are regular.  Review of Systems: all systems are reviewed and pertinent positive as per HPI otherwise rest are negative.     Past Medical History:  Diagnosis Date   Anemia    Diabetes mellitus without complication (HCC)    Type 2   Family history of adverse reaction to anesthesia    mother slow to wake up   Genital herpes    GERD (gastroesophageal reflux disease)    Hypertension    Interstitial cystitis    Left bundle branch block (LBBB)    chronic   Leukocytosis    NASH (nonalcoholic steatohepatitis)    Osteoarthritis    Plantar fasciitis     Past Surgical History:  Procedure Laterality Date   BACK SURGERY     L3 L4   CARPAL TUNNEL RELEASE Bilateral    CESAREAN SECTION     twice 1978 and 1987   CHOLECYSTECTOMY     DILATION AND CURETTAGE OF UTERUS  1975   DIRECT LARYNGOSCOPY N/A 09/26/2024   Procedure: LARYNGOSCOPY, DIRECT;  Surgeon: Tobie Eldora NOVAK, MD;  Location: [facility] OR;  Service: ENT;  Laterality: N/A;   EYE SURGERY Bilateral 2021   cataracts removed   RADICAL NECK DISSECTION Right 09/26/2024   Procedure: DISSECTION, NECK, RADICAL;  Surgeon: Tobie Eldora NOVAK, MD;  Location: [facility] OR;  Service: ENT;  Laterality: Right;   SHOULDER SURGERY Right 2012   rotator cuff repair   TONSILLECTOMY Bilateral 09/26/2024   Procedure: TONSILLECTOMY;  Surgeon: Tobie Eldora NOVAK, MD;  Location: [facility] OR;  Service: ENT;  Laterality: Bilateral;   TUBAL LIGATION  1987    Social history   reports that she quit smoking  about 39 years ago. Her smoking use included cigarettes. She started smoking about 40 years ago. She has a 0.5 pack-year smoking history. She has never used smokeless tobacco. She reports that she does not currently use alcohol . She reports that she does not use drugs.  Allergies[1]  Family History  Problem Relation Age of Onset   Hypertension Mother    Diabetes Mother    Other Mother        hay fever   Pneumonia Mother        aspiration   Gout Father    Skin cancer Father    Heart attack Father    Kidney failure Father    Hypertension Father    CVA Father    CAD Father    Asthma Brother     Other Brother        hay fever   Diabetes type II Maternal Grandfather    Alcoholism Brother      Prior to Admission medications  Medication Sig Start Date End Date Taking? Authorizing Provider  acetaminophen  (TYLENOL ) 500 MG tablet Take 2 tablets (1,000 mg total) by mouth every 6 (six) hours. 09/26/24   Tobie Eldora NOVAK, MD  allopurinol  (ZYLOPRIM ) 300 MG tablet Take 300 mg by mouth in the morning.    [provider]  amLODipine  (NORVASC ) 10 MG tablet Take 10 mg by mouth in the morning.    [provider]  bacitracin  ointment Apply to neck incision twice daily for 7 days 09/26/24 09/26/25  Tobie Eldora NOVAK, MD  cetirizine (ZYRTEC) 10 MG tablet Take 10 mg by mouth in the morning.    [provider]  ciprofloxacin (CIPRO) 500 MG tablet Take 500 mg by mouth 2 (two) times daily. for 5 days    [provider]  colchicine  0.6 MG tablet TAKE 1 TABLET BY MOUTH TWICE A DAY Patient taking differently: Take 0.6 mg by mouth 2 (two) times daily as needed (gout). 08/25/19   [name], DPM  COMIRNATY syringe  10/20/24   [provider]  fenofibrate  (TRICOR ) 48 MG tablet TAKE 1 TABLET BY MOUTH DAILY 11/07/24   [name], [name], DO  fluconazole  (DIFLUCAN ) 100 MG tablet Take 2 tablets today, then 1 tablet daily x 20 more days.  HOLD CRESTOR  AND COLCHICINE  WHILE ON THIS 12/08/24   Izell Domino, MD  hydrochlorothiazide  (MICROZIDE ) 12.5 MG capsule Take 12.5 mg by mouth in the morning. 12/06/21   [provider]  HYDROcodone -acetaminophen  (NORCO/VICODIN) 5-325 MG tablet Take 1-2 tablets by mouth every 4 (four) hours as needed for moderate pain (pain score 4-6) (take with food). Do not exceed 4000mg  of acetaminophen  daily. 12/01/24   Izell Domino, MD  icosapent  Ethyl (VASCEPA ) 1 g capsule Take 2 capsules (2 g total) by mouth 2 (two) times daily. 03/18/24   [name], [name], DO  irbesartan  (AVAPRO ) 300 MG tablet TAKE 1 TABLET BY MOUTH DAILY 01/18/21   [name],  MD  LORazepam  (ATIVAN ) 0.5 MG tablet Take 1 tablet (0.5 mg total) by mouth every 8 (eight) hours as needed. 11/17/24   Izell Domino, MD  magic mouthwash (nystatin , lidocaine , diphenhydrAMINE, alum & mag hydroxide) suspension Swish and spit 15 mLs 4 (four) times daily as needed for mouth pain. 12/16/24   Wyatt Leeroy HERO, PA-C  metFORMIN  (GLUCOPHAGE ) 1000 MG tablet Take 1,000 mg by mouth 2 (two) times daily with a meal.    [provider]  metoprolol  tartrate (LOPRESSOR ) 25 MG tablet Take 75 mg by  mouth 2 (two) times daily. 10/18/19   [provider]  nystatin  cream (MYCOSTATIN ) Apply 1 application topically daily as needed for rash.    [provider]  ondansetron  (ZOFRAN -ODT) 4 MG disintegrating tablet Take 4 mg by mouth every 8 (eight) hours as needed for nausea or vomiting.    [provider]  pantoprazole  (PROTONIX ) 40 MG tablet Take 40 mg by mouth 2 (two) times daily. 09/26/23   [provider]  Pentosan Polysulfate Sodium  200 MG CPDR Take 200 mg by mouth 2 (two) times daily.    [provider]  polyvinyl alcohol  (LIQUIFILM TEARS) 1.4 % ophthalmic solution Place 1 drop into both eyes as needed for dry eyes.    [provider]  PREVIDENT 5000 SENSITIVE 1.1-5 % GEL APPLY 5 GRAMS TO THE TEETH AT BEDTIME. USE WITH FLUORIDE TRAYS AT NIGHT. KEEP IT IN FOR 20 MINUTES 09/08/24   [provider]  promethazine  (PHENERGAN ) 25 MG tablet Take 1 tablet (25 mg total) by mouth every 6 (six) hours as needed for nausea or vomiting. 11/17/24   Izell Domino, MD  rosuvastatin  (CRESTOR ) 10 MG tablet TAKE 1 TABLET BY MOUTH DAILY 11/07/24   [name], [name], DO  scopolamine  (TRANSDERM-SCOP) 1 MG/3DAYS Place 1 patch (1 mg total) onto the skin every 3 (three) days. 12/08/24   Izell Domino, MD  Sod Fluoride-Potassium Nitrate 1.1-4.5 % GEL Place 5 g onto teeth. 09/08/24   [provider]  triamcinolone  cream (KENALOG ) 0.1 % Apply 1 Application  topically 2 (two) times daily.    [provider]  trimethoprim  (TRIMPEX ) 100 MG tablet Take 100 mg by mouth every evening. 12/13/20   [provider]  valACYclovir  (VALTREX ) 500 MG tablet Take 500 mg by mouth daily.    [provider]    Physical Exam: Vitals:   01/14/25 1730 01/14/25 1751  BP: (!) 143/81 (!) 143/81  Pulse: 78 78  Resp: 20 20  Temp: 98.3 F (36.8 C) 98.3 F (36.8 C)  TempSrc: Oral Oral  SpO2: 100%   Weight:  74.8 kg  Height:  5' (1.524 m)    Constitutional: NAD, calm, comfortable.  Chronically sick looking.  Not in any distress.  Frail. Vitals:   01/14/25 1730 01/14/25 1751  BP: (!) 143/81 (!) 143/81  Pulse: 78 78  Resp: 20 20  Temp: 98.3 F (36.8 C) 98.3 F (36.8 C)  TempSrc: Oral Oral  SpO2: 100%   Weight:  74.8 kg  Height:  5' (1.524 m)   Eyes: PERRL, lids and conjunctivae normal ENMT: Mucous membranes are moist.  Coated tongue but no other abnormality.  Posterior pharynx clear of any exudate or lesions.Normal dentition.  Neck: normal, supple, no masses, no thyromegaly, mild tenderness on the right cervical area with no palpable abscess or deformity. Respiratory: clear to auscultation bilaterally, no wheezing, no crackles. Normal respiratory effort. No accessory muscle use.  Cardiovascular: Regular rate and rhythm, no murmurs / rubs / gallops. No extremity edema. 2+ pedal pulses. No carotid bruits.  Abdomen: no tenderness, no masses palpated. No hepatosplenomegaly. Bowel sounds positive.  Musculoskeletal: no clubbing / cyanosis. No joint deformity upper and lower extremities. Good ROM, no contractures. Normal muscle tone.  Skin: no rashes, lesions, ulcers. No induration Neurologic: CN 2-12 grossly intact. Sensation intact, DTR normal. Strength 5/5 in all 4.  Psychiatric: Normal judgment and insight. Alert and oriented x 3. Normal mood.     Labs on Admission: I have personally reviewed following labs and  imaging  studies  CBC: No results for input(s): WBC, NEUTROABS, HGB, HCT, MCV, PLT in the last 168 hours. Basic Metabolic Panel: No results for input(s): NA, K, CL, CO2, GLUCOSE, BUN, CREATININE, CALCIUM , MG, PHOS in the last 168 hours. GFR: Estimated Creatinine Clearance: 57.4 mL/min (by C-G formula based on SCr of 0.75 mg/dL). Liver Function Tests: No results for input(s): AST, ALT, ALKPHOS, BILITOT, PROT, ALBUMIN  in the last 168 hours. No results for input(s): LIPASE, AMYLASE in the last 168 hours. No results for input(s): AMMONIA in the last 168 hours. Coagulation Profile: No results for input(s): INR, PROTIME in the last 168 hours. Cardiac Enzymes: No results for input(s): CKTOTAL, CKMB, CKMBINDEX, TROPONINI in the last 168 hours. BNP (last 3 results) No results for input(s): PROBNP in the last 8760 hours. HbA1C: No results for input(s): HGBA1C in the last 72 hours. CBG: No results for input(s): GLUCAP in the last 168 hours. Lipid Profile: No results for input(s): CHOL, HDL, LDLCALC, TRIG, CHOLHDL, LDLDIRECT in the last 72 hours. Thyroid  Function Tests: No results for input(s): TSH, T4TOTAL, FREET4, T3FREE, THYROIDAB in the last 72 hours. Anemia Panel: No results for input(s): VITAMINB12, FOLATE, FERRITIN, TIBC, IRON, RETICCTPCT in the last 72 hours. Urine analysis:    Component Value Date/Time   COLORURINE YELLOW 02/04/2022 0304   APPEARANCEUR CLEAR 02/04/2022 0304   LABSPEC 1.015 02/04/2022 0304   PHURINE 5.0 02/04/2022 0304   GLUCOSEU >=500 (A) 02/04/2022 0304   HGBUR NEGATIVE 02/04/2022 0304   BILIRUBINUR NEGATIVE 02/04/2022 0304   KETONESUR 20 (A) 02/04/2022 0304   PROTEINUR NEGATIVE 02/04/2022 0304   NITRITE NEGATIVE 02/04/2022 0304   LEUKOCYTESUR NEGATIVE 02/04/2022 0304    Radiological Exams on Admission: No results found.  Assessment/Plan Principal Problem:    Failure to thrive in adult Active Problems:   Diabetes mellitus (HCC)   Essential hypertension   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)     1.  Failure to thrive secondary to dysphagia from head and neck cancer: Patient seen by dietitian as outpatient.  Unable to keep up adequate oral intake.  Recommended surgical feeding. Agree with admission given severity of symptoms. Will check electrolytes, magnesium , phosphorus, TSH, renal function test. Start maintenance fluid.  Symptomatic management with nausea medications, Magic mouthwash. Consult IR for PEG tube placement, patient has already been educated. Patient is high risk for refeeding syndrome, will need monitoring after starting tube feeding with very close monitoring of electrolytes.  2.  Squamous cell carcinoma of the head and neck with unknown primary: Completed radiation therapy.  Followed by radiation oncology.  They are aware about patient being in the hospital.  3.  Chronic medical issues including Hypertension, currently not needing treatment Type 2 diabetes, currently not needing treatment will keep on sliding scale insulin  and monitor blood sugars to avoid hypoglycemia. Hyperlipidemia, discontinued statins due to poor appetite.   DVT prophylaxis: SCDs for possible procedure Code Status: Full code Family Communication: Husband at the bedside Disposition Plan: Home once stable Consults called: IR Admission status: Admission inpatient    Renato Applebaum MD [facility]       [1]  Allergies Allergen Reactions   Penicillins Shortness Of Breath, Itching and Rash   Accupril [Quinapril Hcl] Cough   Januvia [Sitagliptin] Other (See Comments)    thrush   Ondansetron  Hcl     Other Reaction(s): too strong   Polysaccharide Iron Complex     Other Reaction(s): nausea, constipation, diarrhea  Monistat [Miconazole] Itching and Swelling    Monistat    Tioconazole Rash     Other reaction(s): rash   Zestril [Lisinopril] Cough   "

## 2025-01-15 DIAGNOSIS — I10 Essential (primary) hypertension: Secondary | ICD-10-CM

## 2025-01-15 DIAGNOSIS — C4492 Squamous cell carcinoma of skin, unspecified: Secondary | ICD-10-CM

## 2025-01-15 DIAGNOSIS — E43 Unspecified severe protein-calorie malnutrition: Secondary | ICD-10-CM | POA: Diagnosis not present

## 2025-01-15 DIAGNOSIS — R627 Adult failure to thrive: Secondary | ICD-10-CM

## 2025-01-15 DIAGNOSIS — C7989 Secondary malignant neoplasm of other specified sites: Secondary | ICD-10-CM

## 2025-01-15 DIAGNOSIS — E878 Other disorders of electrolyte and fluid balance, not elsewhere classified: Secondary | ICD-10-CM | POA: Diagnosis not present

## 2025-01-15 LAB — HEMOGLOBIN A1C
Hgb A1c MFr Bld: 6 % — ABNORMAL HIGH (ref 4.8–5.6)
Mean Plasma Glucose: 125.5 mg/dL

## 2025-01-15 LAB — BASIC METABOLIC PANEL WITH GFR
Anion gap: 13 (ref 5–15)
BUN: <5 mg/dL — ABNORMAL LOW (ref 8–23)
CO2: 24 mmol/L (ref 22–32)
Calcium: 8.9 mg/dL (ref 8.9–10.3)
Chloride: 99 mmol/L (ref 98–111)
Creatinine, Ser: 0.6 mg/dL (ref 0.44–1.00)
GFR, Estimated: >60 mL/min
Glucose, Bld: 99 mg/dL (ref 70–99)
Potassium: 3.3 mmol/L — ABNORMAL LOW (ref 3.5–5.1)
Sodium: 136 mmol/L (ref 135–145)

## 2025-01-15 LAB — GLUCOSE, CAPILLARY
Glucose-Capillary: 106 mg/dL — ABNORMAL HIGH (ref 70–99)
Glucose-Capillary: 95 mg/dL (ref 70–99)
Glucose-Capillary: 94 mg/dL (ref 70–99)
Glucose-Capillary: 115 mg/dL — ABNORMAL HIGH (ref 70–99)

## 2025-01-15 LAB — MAGNESIUM: Magnesium: 2 mg/dL (ref 1.7–2.4)

## 2025-01-15 MED ORDER — IOHEXOL 9 MG/ML PO SOLN
500.0000 mL | Freq: Once | ORAL | Status: AC
  Administered 2025-01-15: 500 mL via ORAL

## 2025-01-15 MED ORDER — IOHEXOL 9 MG/ML PO SOLN: 500.0000 mL | Freq: Once | ORAL | Status: DC

## 2025-01-15 MED ORDER — LORAZEPAM 2 MG/ML IJ SOLN
0.5000 mg | Freq: Three times a day (TID) | INTRAMUSCULAR | Status: DC | PRN
  Administered 2025-01-16: 0.5 mg via INTRAVENOUS
  Filled 2025-01-15: qty 1

## 2025-01-15 MED ORDER — LACTATED RINGERS IV SOLN: INTRAVENOUS | Status: AC

## 2025-01-15 MED ORDER — POTASSIUM CHLORIDE 10 MEQ/100ML IV SOLN
INTRAVENOUS | Status: AC
  Filled 2025-01-15: qty 100

## 2025-01-15 MED ORDER — POTASSIUM CHLORIDE 10 MEQ/100ML IV SOLN
10.0000 meq | INTRAVENOUS | Status: AC
  Administered 2025-01-15 (×4): 10 meq via INTRAVENOUS
  Filled 2025-01-15 (×2): qty 100

## 2025-01-15 MED ORDER — PHENOL 1.4 % MT LIQD
1.0000 | OROMUCOSAL | Status: DC | PRN
  Administered 2025-01-15: 1 via OROMUCOSAL
  Filled 2025-01-15: qty 177

## 2025-01-15 NOTE — Hospital Course (Addendum)
 Partially taken from H&P.  Diane Mayo is a 74 y.o. 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  Patient reported progressively worsening difficulty swallowing, poor appetite.  On presentation hemodynamically stable, labs with potassium of 2.7, magnesium  1.6, TSH 2.550, A1c of 6.0.   Electrolytes were repleted and IR was consulted for PEG tube placement.  1/22: Vital normal, potassium at 3.3, magnesium  2.0.  Replating potassium.  Awaiting IR procedure hoping that can be done tomorrow otherwise it will be done on Monday.  Started on clear liquid diet  1/23: Remained hemodynamically stable, s/p PEG tube placement by IR in the afternoon today.  They want to wait 24 hours before starting feeding.  Dietitian was consulted.  Husband is very eager to go home by tomorrow.  He would like to get trained and instructions how to advance diet and will follow-up with her oncologist for lab work as she will be high risk for refeeding.  1/24: Remained hemodynamically stable.  Electrolytes normal.  Dietitian placed the recommendations for tube feeding which she will slowly increase to the goal.  Patient and husband were given the tube feed education.  She was given some oxycodone  at her request to use only if simple Tylenol  does not help.  Patient was instructed to have a close follow-up with her oncologist and have her electrolytes monitored as she will be high risk for refeeding syndrome.  She will continue the rest of her home medications and follow-up  with her providers for further assistance.

## 2025-01-15 NOTE — Consult Note (Signed)
 "   Chief Complaint: Dysphagia  Referring Provider(s): Izell Domino   Supervising Physician: Jennefer Rover  Patient Status: [facility] - In-pt  History of Present Illness: Diane Mayo is a 74 y.o. 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  She recently completed 30 cycles of radiation therapy on 12/08/2024.  Radiation oncologist called and directly admitted the patient due to failure to thrive: Progressive difficulty swallowing, poor appetite, pain on the right side of the neck with each swallow.  Note states that she has only been able to tolerate sips of soup and some ice cream.  Hypokalemia (2.7) noted upon admission, has since improved to 3.3 with replacement. Team is recommending gastrostomy tube placement; IR requested.   Does not wear CPAP or use supplemental home O2.  Endorses sore throat, dysphagia, poor appetite, trouble tolerating more than sips of warm tea or bites of ice cream. Denies fever, chills, N/V, abd pain, blood in urine or stool.   Allergies Reviewed:  Penicillins, Accupril [quinapril hcl], Januvia [sitagliptin], Ondansetron  hcl, Polysaccharide iron complex, Monistat [miconazole], Tioconazole, and Zestril [lisinopril]   Patient is Full Code  Past Medical History:  Diagnosis Date   Anemia    Diabetes mellitus without complication (HCC)    Type 2   Family history of adverse reaction to anesthesia    mother slow to wake up   Genital herpes    GERD (gastroesophageal reflux disease)    Hypertension    Interstitial cystitis    Left bundle branch block (LBBB)    chronic   Leukocytosis    NASH (nonalcoholic steatohepatitis)    Osteoarthritis    Plantar fasciitis     Past Surgical History:  Procedure Laterality Date   BACK SURGERY     L3 L4   CARPAL TUNNEL RELEASE Bilateral    CESAREAN SECTION     twice 1978 and 1987   CHOLECYSTECTOMY     DILATION  AND CURETTAGE OF UTERUS  1975   DIRECT LARYNGOSCOPY N/A 09/26/2024   Procedure: LARYNGOSCOPY, DIRECT;  Surgeon: Tobie Eldora NOVAK, MD;  Location: [facility] OR;  Service: ENT;  Laterality: N/A;   EYE SURGERY Bilateral 2021   cataracts removed   RADICAL NECK DISSECTION Right 09/26/2024   Procedure: DISSECTION, NECK, RADICAL;  Surgeon: Tobie Eldora NOVAK, MD;  Location: [facility] OR;  Service: ENT;  Laterality: Right;   SHOULDER SURGERY Right 2012   rotator cuff repair   TONSILLECTOMY Bilateral 09/26/2024   Procedure: TONSILLECTOMY;  Surgeon: Tobie Eldora NOVAK, MD;  Location: [facility] OR;  Service: ENT;  Laterality: Bilateral;   TUBAL LIGATION  1987      Medications: Prior to Admission medications  Medication Sig Start Date End Date Taking? Authorizing Provider  bacitracin  ointment Apply to neck incision twice daily for 7 days Patient taking differently: Apply 1 Application topically daily. Apply to neck incision twice daily for 7 days 09/26/24 09/26/25 Yes Tobie Eldora B, MD  colchicine  0.6 MG tablet TAKE 1 TABLET BY MOUTH TWICE A DAY Patient taking differently: Take 0.6 mg by mouth 2 (two) times daily as needed (gout). 08/25/19  Yes Regal, [place] RAMAN, DPM  ibuprofen (ADVIL) 200 MG tablet Take 400 mg by mouth every 6 (six) hours as needed for mild pain (pain score 1-3) or moderate pain (pain score 4-6).   Yes [provider]  magic mouthwash (nystatin , lidocaine , diphenhydrAMINE, alum & mag hydroxide) suspension Swish and spit  15 mLs 4 (four) times daily as needed for mouth pain. 12/16/24  Yes Wyatt Leeroy HERO, PA-C  nystatin  cream (MYCOSTATIN ) Apply 1 application topically daily as needed for rash.   Yes [provider]  polyvinyl alcohol  (LIQUIFILM TEARS) 1.4 % ophthalmic solution Place 1 drop into both eyes as needed for dry eyes.   Yes [provider]  PREVIDENT 5000 SENSITIVE 1.1-5 % GEL Place 1 Application onto teeth daily. 09/08/24  Yes [provider]  promethazine  (PHENERGAN ) 25 MG  tablet Take 1 tablet (25 mg total) by mouth every 6 (six) hours as needed for nausea or vomiting. 11/17/24  Yes Izell Domino, MD  allopurinol  (ZYLOPRIM ) 300 MG tablet Take 300 mg by mouth in the morning. Patient not taking: Reported on 01/15/2025    [provider]  amLODipine  (NORVASC ) 10 MG tablet Take 10 mg by mouth in the morning. Patient not taking: Reported on 01/15/2025    [provider]  ciprofloxacin (CIPRO) 500 MG tablet Take 500 mg by mouth 2 (two) times daily. for 5 days Patient not taking: Reported on 01/15/2025    [provider]  fenofibrate  (TRICOR ) 48 MG tablet TAKE 1 TABLET BY MOUTH DAILY Patient not taking: Reported on 01/15/2025 11/07/24   [name], [name], DO  fluconazole  (DIFLUCAN ) 100 MG tablet Take 2 tablets today, then 1 tablet daily x 20 more days.  HOLD CRESTOR  AND COLCHICINE  WHILE ON THIS Patient not taking: Reported on 01/15/2025 12/08/24   Izell Domino, MD  hydrochlorothiazide  (MICROZIDE ) 12.5 MG capsule Take 12.5 mg by mouth in the morning. Patient not taking: Reported on 01/15/2025 12/06/21   [provider]  HYDROcodone -acetaminophen  (NORCO/VICODIN) 5-325 MG tablet Take 1-2 tablets by mouth every 4 (four) hours as needed for moderate pain (pain score 4-6) (take with food). Do not exceed 4000mg  of acetaminophen  daily. Patient not taking: Reported on 01/15/2025 12/01/24   Izell Domino, MD  icosapent  Ethyl (VASCEPA ) 1 g capsule Take 2 capsules (2 g total) by mouth 2 (two) times daily. Patient not taking: Reported on 01/15/2025 03/18/24   [name], [name], DO  irbesartan  (AVAPRO ) 300 MG tablet TAKE 1 TABLET BY MOUTH DAILY Patient not taking: Reported on 01/15/2025 01/18/21   [name], MD  LORazepam  (ATIVAN ) 0.5 MG tablet Take 1 tablet (0.5 mg total) by mouth every 8 (eight) hours as needed. Patient not taking: Reported on 01/15/2025 11/17/24   Izell Domino, MD  metFORMIN  (GLUCOPHAGE ) 1000 MG tablet Take 1,000 mg by mouth 2 (two) times  daily with a meal. Patient not taking: Reported on 01/15/2025    [provider]  metoprolol  tartrate (LOPRESSOR ) 25 MG tablet Take 75 mg by mouth 2 (two) times daily. Patient not taking: Reported on 01/15/2025 10/18/19   [provider]  pantoprazole  (PROTONIX ) 40 MG tablet Take 40 mg by mouth 2 (two) times daily. Patient not taking: Reported on 01/15/2025 09/26/23   [provider]  Pentosan Polysulfate Sodium  200 MG CPDR Take 200 mg by mouth 2 (two) times daily. Patient not taking: Reported on 01/15/2025    [provider]  rosuvastatin  (CRESTOR ) 10 MG tablet TAKE 1 TABLET BY MOUTH DAILY Patient not taking: Reported on 01/15/2025 11/07/24   [name], [name], DO  scopolamine  (TRANSDERM-SCOP) 1 MG/3DAYS Place 1 patch (1 mg total) onto the skin every 3 (three) days. Patient not taking: Reported on 01/15/2025 12/08/24   Izell Domino, MD  triamcinolone  cream (KENALOG ) 0.1 % Apply 1 Application topically 2 (two) times daily. Patient not  taking: Reported on 01/15/2025    [provider]  trimethoprim  (TRIMPEX ) 100 MG tablet Take 100 mg by mouth every evening. Patient not taking: Reported on 01/15/2025 12/13/20   [provider]  valACYclovir  (VALTREX ) 500 MG tablet Take 500 mg by mouth daily. Patient not taking: Reported on 01/15/2025    [provider]     Family History  Problem Relation Age of Onset   Hypertension Mother    Diabetes Mother    Other Mother        hay fever   Pneumonia Mother        aspiration   Gout Father    Skin cancer Father    Heart attack Father    Kidney failure Father    Hypertension Father    CVA Father    CAD Father    Asthma Brother    Other Brother        hay fever   Diabetes type II Maternal Grandfather    Alcoholism Brother     Social History   Socioeconomic History   Marital status: Married    Spouse name: Not on file   Number of children: 2   Years of education: Not on file   Highest  education level: Not on file  Occupational History   Not on file  Tobacco Use   Smoking status: Former    Current packs/day: 0.00    Average packs/day: 0.5 packs/day for 1 year (0.5 ttl pk-yrs)    Types: Cigarettes    Start date: 39    Quit date: 21    Years since quitting: 39.0   Smokeless tobacco: Never  Vaping Use   Vaping status: Never Used  Substance and Sexual Activity   Alcohol  use: Not Currently    Comment: maybe one glass of champagne on holidays   Drug use: No   Sexual activity: Not on file  Other Topics Concern   Not on file  Social History Narrative   Not on file   Social Drivers of Health   Tobacco Use: Medium Risk (01/14/2025)   Patient History    Smoking Tobacco Use: Former    Smokeless Tobacco Use: Never    Passive Exposure: Not on Actuary Strain: Not on file  Food Insecurity: No Food Insecurity (01/14/2025)   Epic    Worried About Programme Researcher, Broadcasting/film/video in the Last Year: Never true    Ran Out of Food in the Last Year: Never true  Transportation Needs: No Transportation Needs (01/14/2025)   Epic    Lack of Transportation (Medical): No    Lack of Transportation (Non-Medical): No  Physical Activity: Not on file  Stress: No Stress Concern Present (10/29/2024)   [employer] of Occupational Health - Occupational Stress Questionnaire    Feeling of Stress: Not at all  Social Connections: Moderately Integrated (01/14/2025)   Social Connection and Isolation Panel    Frequency of Communication with Friends and Family: Three times a week    Frequency of Social Gatherings with Friends and Family: Three times a week    Attends Religious Services: More than 4 times per year    Active Member of Clubs or Organizations: No    Attends Banker Meetings: Never    Marital Status: Married  Depression (PHQ2-9): Low Risk (12/31/2024)   Depression (PHQ2-9)    PHQ-2 Score: 0  Alcohol  Screen: Low Risk (12/31/2024)   Alcohol  Screen    Last  Alcohol  Screening  Score (AUDIT): 0  Housing: Low Risk (01/14/2025)   Epic    Unable to Pay for Housing in the Last Year: No    Number of Times Moved in the Last Year: 0    Homeless in the Last Year: No  Utilities: Not At Risk (01/14/2025)   Epic    Threatened with loss of utilities: No  Health Literacy: Not on file     Review of Systems: A 12 point ROS discussed and pertinent positives are indicated in the HPI above.  All other systems are negative.    Vital Signs: BP 134/78 (BP Location: Right Arm)   Pulse 73   Temp 98 F (36.7 C) (Oral)   Resp 14   Ht 5' (1.524 m)   Wt 165 lb (74.8 kg)   SpO2 96%   BMI 32.22 kg/m      Physical Exam HENT:     Mouth/Throat:     Mouth: Mucous membranes are moist.     Pharynx: Oropharynx is clear.  Cardiovascular:     Rate and Rhythm: Normal rate and regular rhythm.     Pulses: Normal pulses.     Heart sounds: Normal heart sounds.  Pulmonary:     Effort: Pulmonary effort is normal.     Breath sounds: Normal breath sounds.  Abdominal:     General: There is no distension.     Palpations: Abdomen is soft.     Tenderness: There is no abdominal tenderness.  Skin:    General: Skin is warm and dry.     Coloration: Skin is not jaundiced.     Findings: No rash.  Neurological:     Mental Status: She is alert and oriented to person, place, and time.  Psychiatric:        Mood and Affect: Mood normal.        Behavior: Behavior normal.        Thought Content: Thought content normal.        Judgment: Judgment normal.     Imaging: No results found.  Labs:  CBC: Recent Labs    09/26/24 1207 10/03/24 1136 01/14/25 1841  WBC 7.9 9.9 5.1  HGB 11.1* 10.6* 11.8*  HCT 34.9* 32.6* 38.4  PLT 232 290 209    COAGS: Recent Labs    01/14/25 1841  INR 1.0    BMP: Recent Labs    11/17/24 1528 12/31/24 1053 01/14/25 1841 01/15/25 0609  NA 135 139 137 136  K 4.6 3.3* 2.7* 3.3*  CL 99 97* 97* 99  CO2 21* 26 24 24   GLUCOSE  146* 138* 105* 99  BUN 17 6* 7* <5*  CALCIUM  10.3 9.4 9.4 8.9  CREATININE 1.42* 0.75 0.61 0.60  GFRNONAA 39* >60 >60 >60    LIVER FUNCTION TESTS: Recent Labs    02/05/24 0939 05/14/24 0813 10/03/24 1136 01/14/25 1841  BILITOT 0.3 0.2 0.7 0.7  AST 44* 55* 32 26  ALT 46* 39* 28 12  ALKPHOS 87 86 58 80  PROT 6.8 6.5 7.2 6.7  ALBUMIN  4.5 4.5 4.2 3.7    TUMOR MARKERS: No results for input(s): AFPTM, CEA, CA199, CHROMGRNA in the last 8760 hours.  Assessment and Plan:  Request for image guided gastrostomy tube insertion reviewed by Dr. Jennefer. Anatomy is marginal, will require PO or per NG tube contrast the night prior. Tentatively planned for Monday.  Contrast ordered for Sunday night. Will be walked to the floor closer to procedure by IR APP. If  she cannot tolerate drinking the contrast, will need NG tube placed to receive contrast.  No contraindications for procedure identified in ROS, physical exam, or review of pre-sedation considerations. CBC and INR to be drawn day of procedure. INR previously in admission favorable.  08/15/24 PET imaging available and reviewed VSS, afebrile K improving, anticipated wnl by time of procedure Patient does not take a blood thinner  Abx - vanc day of procedure (beta lactam allergy)    Risks and benefits image guided gastrostomy tube placement was discussed with the patient including, but not limited to the need for a barium enema during the procedure, bleeding, infection, peritonitis and/or damage to adjacent structures.  All of the patient's questions were answered, patient is agreeable to proceed.  Consent signed and in chart.   Thank you for allowing our service to participate in Diane Mayo 's care.    Electronically Signed: Laymon Coast, NP   01/15/2025, 3:00 PM     I spent a total of 20 Minutes    in face to face in clinical consultation, greater than 50% of which was counseling/coordinating care for image guided  gastrostomy insertion.    (A copy of this note was sent to the referring provider and the time of visit.)  "

## 2025-01-15 NOTE — Assessment & Plan Note (Signed)
 Patient was found to have hypokalemia and hypomagnesemia. Magnesium  has been improved with repletion. Potassium still low at 3.3 but improving. - Replete potassium and monitor

## 2025-01-15 NOTE — Progress Notes (Signed)
" °  Progress Note   Patient: Diane Mayo FMW:[id number] DOB: 05/01/1951 DOA: 01/14/2025     1 DOS: the patient was seen and examined on 01/15/2025   Brief hospital course: Partially taken from H&P.  FRANCELIA MCLAREN is a 74 y.o. 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  Patient reported progressively worsening difficulty swallowing, poor appetite.  On presentation hemodynamically stable, labs with potassium of 2.7, magnesium  1.6, TSH 2.550, A1c of 6.0.   Electrolytes were repleted and IR was consulted for PEG tube placement.  1/22: Vital normal, potassium at 3.3, magnesium  2.0.  Replating potassium.  Awaiting IR procedure hoping that can be done tomorrow otherwise it will be done on Monday.  Started on clear liquid diet    Assessment and Plan: * Failure to thrive in adult Patient was seen by dietitian at cancer center and they recommended PEG tube placement as she was having difficulty eating and poor appetite. -IR was consulted -Continue with supportive care -Once tube feeding will be started-patient will be high risk for refeeding syndrome.  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 [facility]) S/p radiation therapy. -Follow-up with oncology  Electrolyte abnormality Patient was found to have hypokalemia and hypomagnesemia. Magnesium  has been improved with repletion. Potassium still low at 3.3 but improving. - Replete potassium and monitor  Essential hypertension Blood pressure currently within goal.   PCP has taken off her home medications due to poor p.o. intake and failure to thrive.  Diabetes mellitus (HCC) CBG within goal.  Patient was taken off from home medications by PCP. -SSI      Subjective: Patient was feeling weak when seen today.  Appetite remained poor.  Some intermittent pain with swallowing.  Physical Exam: Vitals:   01/14/25 1751 01/14/25 2137 01/15/25 0133 01/15/25 0542  BP: (!) 143/81 139/73 (!) 147/80 134/78  Pulse: 78 70 72 73  Resp: 20 14 14 14   Temp: 98.3 F (36.8 C) 98 F (36.7 C) 97.9 F (36.6 C) 98 F (36.7 C)  TempSrc: Oral Oral Oral Oral  SpO2:  99% 95% 96%  Weight: 74.8 kg     Height: 5' (1.524 m)      General.  Frail and malnourished elderly lady, in no acute distress. Pulmonary.  Lungs clear bilaterally, normal respiratory effort. CV.  Regular rate and rhythm, no JVD, rub or murmur. Abdomen.  Soft, nontender, nondistended, BS positive. CNS.  Alert and oriented .  No focal neurologic deficit. Extremities.  No edema, no cyanosis, pulses intact and symmetrical.  Data Reviewed: Prior data reviewed.  Family Communication: Discussed with husband at bedside  Disposition: Status is: Inpatient Remains inpatient appropriate because: Severity of illness  Planned Discharge Destination: Home with Home Health  Time spent: 45 minutes  This record has been created using Conservation officer, historic buildings. Errors have been sought and corrected,but may not always be located. Such creation errors do not reflect on the standard of care.   Author: Amaryllis Dare, MD 01/15/2025 3:29 PM  For on call review [email or web address].  "

## 2025-01-15 NOTE — Assessment & Plan Note (Signed)
 S/p radiation therapy. -Follow-up with oncology

## 2025-01-15 NOTE — Assessment & Plan Note (Signed)
 CBG within goal.  Patient was taken off from home medications by PCP. -SSI

## 2025-01-15 NOTE — Plan of Care (Signed)

## 2025-01-15 NOTE — Progress Notes (Signed)
 Initial Nutrition Assessment  DOCUMENTATION CODES:   Severe malnutrition in context of chronic illness  INTERVENTION:   Monitor magnesium , potassium, and phosphorus daily for at least 3 days, MD to replete as needed, as pt is at risk for refeeding syndrome.  -Recommend Thiamine 100 mg daily for 7 days   Once tube placed and ready to use: -Initiate Osmolite 1.5 @ 20 ml/hr, advance by 10 ml every 12 hours to goal rate of 50 ml/hr. -Provides at goal: 1800 kcals, 75g protein and 914 ml H2O -Free water: 120 ml every 4 hours (720 ml)  Prior to discharge, plan is to transition to bolus-gravity feeds.  NUTRITION DIAGNOSIS:   Severe Malnutrition related to chronic illness, cancer and cancer related treatments as evidenced by moderate fat depletion, moderate muscle depletion, percent weight loss, energy intake < or equal to 75% for > or equal to 1 month.  GOAL:   Patient will meet greater than or equal to 90% of their needs  MONITOR:   TF tolerance  REASON FOR ASSESSMENT:   Consult Assessment of nutrition requirement/status  ASSESSMENT:   74 y.o. 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  Patient in room, husband at bedside. [facility], last seen 1/20. At that time PEG was recommended as patient has not been able to consume adequate PO for 3 months. Pt reports this started as she couldn't consume solids then progressed to even liquids wouldn't go down or would at times come back up. Pt with odynophagia but  states today pain has improved. Pt endorses taste and smell aversions to food as well.  Last radiation was on 12/08/24. Was hopeful symptoms would improve but they never did.  Pt has tried multiple protein shakes but nothing would work consistently. Subsisting on ice cream and sips of soup. Will monitor for PEG placement (likely tomorrow). Will leave recs ready to start once able. Will be at refeeding risk given poor PO for 3 months now and persistent weight loss.  Per weight records, pt has  lost 26 lbs since 12/2 (13% wt loss x 1.5 months, significant for time frame).  Medications: KLOR-CON , Nystatin , IV Mg sulfate, KCl  Labs reviewed: CBGs: 95-106 Low potassium   NUTRITION - FOCUSED PHYSICAL EXAM:  Flowsheet Row Most Recent Value  Orbital Region Moderate depletion  Upper Arm Region Moderate depletion  Thoracic and Lumbar Region Mild depletion  Buccal Region Mild depletion  Temple Region Mild depletion  Clavicle Bone Region Moderate depletion  Clavicle and Acromion Bone Region Moderate depletion  Scapular Bone Region Moderate depletion  Dorsal Hand Moderate depletion  Patellar Region Unable to assess  Anterior Thigh Region Unable to assess  Posterior Calf Region Unable to assess  Edema (RD Assessment) None  Hair Reviewed  Eyes Reviewed  Mouth Reviewed  Skin Reviewed  Nails Reviewed    Diet Order:   Diet Order             Diet NPO time specified  Diet effective midnight  EDUCATION NEEDS:   Education needs have been addressed  Skin:  Skin Assessment: Reviewed RN Assessment  Last BM:  1/22  Height:   Ht Readings from Last 1 Encounters:  01/14/25 5' (1.524 m)    Weight:   Wt Readings from Last 1 Encounters:  01/14/25 74.8 kg     BMI:  Body mass index is 32.22 kg/m.  Estimated Nutritional Needs:   Kcal:  1800-2000  Protein:  75-90g  Fluid:  2L/day   Morna Lee, MS, RD, LDN Inpatient Clinical Dietitian Contact via  Secure chat

## 2025-01-15 NOTE — Assessment & Plan Note (Addendum)
 Patient was seen by dietitian at cancer center and they recommended PEG tube placement as she was having difficulty eating and poor appetite. -IR was consulted -Continue with supportive care -Once tube feeding will be started-patient will be high risk for refeeding syndrome.

## 2025-01-15 NOTE — Progress Notes (Signed)
" °  IR BRIEF PROGRESS NOTE:  IR will attempt to accommodate for G-tube placement tomorrow in IR, instead of Monday 1/26. A cancellation may allow for attempt.   Patient made NPO @ 00:01 on 1/23. PO contrast brought to floor and given to RN for 20:00 administration this evening. KUB @ 05:00 tomorrow following PO contrast administration.  Please cancel KUB if PO contrast not given overnight. IR will review in AM, and update Care team accordingly.   Electronically Signed: Carlin DELENA Griffon, PA-C 01/15/2025, 6:01 PM      "

## 2025-01-15 NOTE — Assessment & Plan Note (Signed)
 Blood pressure currently within goal.  PCP has taken off her home medications due to poor p.o. intake and failure to thrive.

## 2025-01-16 ENCOUNTER — Ambulatory Visit: Admitting: Physical Therapy

## 2025-01-16 ENCOUNTER — Inpatient Hospital Stay (HOSPITAL_COMMUNITY)

## 2025-01-16 DIAGNOSIS — R627 Adult failure to thrive: Secondary | ICD-10-CM | POA: Diagnosis not present

## 2025-01-16 DIAGNOSIS — C4492 Squamous cell carcinoma of skin, unspecified: Secondary | ICD-10-CM | POA: Diagnosis not present

## 2025-01-16 DIAGNOSIS — E43 Unspecified severe protein-calorie malnutrition: Secondary | ICD-10-CM | POA: Diagnosis not present

## 2025-01-16 DIAGNOSIS — E878 Other disorders of electrolyte and fluid balance, not elsewhere classified: Secondary | ICD-10-CM | POA: Diagnosis not present

## 2025-01-16 LAB — GLUCOSE, CAPILLARY
Glucose-Capillary: 105 mg/dL — ABNORMAL HIGH (ref 70–99)
Glucose-Capillary: 110 mg/dL — ABNORMAL HIGH (ref 70–99)
Glucose-Capillary: 80 mg/dL (ref 70–99)
Glucose-Capillary: 121 mg/dL — ABNORMAL HIGH (ref 70–99)

## 2025-01-16 LAB — RENAL FUNCTION PANEL
Albumin: 3.4 g/dL — ABNORMAL LOW (ref 3.5–5.0)
Anion gap: 11 (ref 5–15)
BUN: <5 mg/dL — ABNORMAL LOW (ref 8–23)
CO2: 26 mmol/L (ref 22–32)
Calcium: 8.9 mg/dL (ref 8.9–10.3)
Chloride: 102 mmol/L (ref 98–111)
Creatinine, Ser: 0.55 mg/dL (ref 0.44–1.00)
GFR, Estimated: >60 mL/min
Glucose, Bld: 108 mg/dL — ABNORMAL HIGH (ref 70–99)
Phosphorus: 2.9 mg/dL (ref 2.5–4.6)
Potassium: 3.6 mmol/L (ref 3.5–5.1)
Sodium: 139 mmol/L (ref 135–145)

## 2025-01-16 LAB — MAGNESIUM: Magnesium: 1.6 mg/dL — ABNORMAL LOW (ref 1.7–2.4)

## 2025-01-16 MED ORDER — VANCOMYCIN HCL IN DEXTROSE 1-5 GM/200ML-% IV SOLN
1000.0000 mg | INTRAVENOUS | Status: AC
  Administered 2025-01-16: 1000 mg via INTRAVENOUS
  Filled 2025-01-16: qty 200

## 2025-01-16 MED ORDER — FENTANYL CITRATE (PF) 100 MCG/2ML IJ SOLN
INTRAMUSCULAR | Status: AC | PRN
  Administered 2025-01-16 (×2): 50 ug via INTRAVENOUS

## 2025-01-16 MED ORDER — IOHEXOL 300 MG/ML  SOLN
50.0000 mL | Freq: Once | INTRAMUSCULAR | Status: AC | PRN
  Administered 2025-01-16: 20 mL

## 2025-01-16 MED ORDER — FENTANYL CITRATE (PF) 100 MCG/2ML IJ SOLN
INTRAMUSCULAR | Status: AC
  Filled 2025-01-16: qty 2

## 2025-01-16 MED ORDER — OXYCODONE HCL 5 MG PO TABS
5.0000 mg | ORAL_TABLET | ORAL | Status: DC | PRN
  Administered 2025-01-16 (×2): 5 mg via ORAL
  Filled 2025-01-16 (×2): qty 1

## 2025-01-16 MED ORDER — LIDOCAINE HCL 1 % IJ SOLN
INTRAMUSCULAR | Status: AC
  Filled 2025-01-16: qty 20

## 2025-01-16 MED ORDER — GLUCAGON HCL RDNA (DIAGNOSTIC) 1 MG IJ SOLR
INTRAMUSCULAR | Status: AC | PRN
  Administered 2025-01-16: .5 mg via INTRAVENOUS

## 2025-01-16 MED ORDER — LIDOCAINE HCL 1 % IJ SOLN
20.0000 mL | Freq: Once | INTRAMUSCULAR | Status: AC
  Administered 2025-01-16: 10 mL via INTRADERMAL
  Filled 2025-01-16: qty 20

## 2025-01-16 MED ORDER — LIDOCAINE VISCOUS HCL 2 % MT SOLN
OROMUCOSAL | Status: AC
  Filled 2025-01-16: qty 15

## 2025-01-16 MED ORDER — GLUCAGON HCL RDNA (DIAGNOSTIC) 1 MG IJ SOLR
INTRAMUSCULAR | Status: AC
  Filled 2025-01-16: qty 1

## 2025-01-16 MED ORDER — MAGNESIUM SULFATE 4 GM/100ML IV SOLN
4.0000 g | Freq: Once | INTRAVENOUS | Status: AC
  Administered 2025-01-16: 4 g via INTRAVENOUS
  Filled 2025-01-16: qty 100

## 2025-01-16 MED ORDER — MIDAZOLAM HCL 2 MG/2ML IJ SOLN
INTRAMUSCULAR | Status: AC
  Filled 2025-01-16: qty 2

## 2025-01-16 MED ORDER — MIDAZOLAM HCL (PF) 2 MG/2ML IJ SOLN
INTRAMUSCULAR | Status: AC | PRN
  Administered 2025-01-16 (×2): 1 mg via INTRAVENOUS

## 2025-01-16 MED ORDER — LIDOCAINE VISCOUS HCL 2 % MT SOLN
15.0000 mL | Freq: Once | OROMUCOSAL | Status: AC
  Administered 2025-01-16: 5 mL via OROMUCOSAL

## 2025-01-16 NOTE — Progress Notes (Addendum)
 Nutrition Brief Note  Received message from RN that PEG tube is not able to be used until tomorrow.  Anticipate long-term need of enteral nutrition.  Will order tube feeding to start tomorrow per MD: -Provide 1/2 carton of Osmolite 1.5 TID -Flush with 60 ml free water before and after each feeding -Advance as tolerated to 5 cartons of Osmolite 1.5 daily  At discharge, pt will be followed by [facility] for tube feeding supplies. Recommendations for after discharge: -5 cartons daily of Nutren 1.5 -Provide 1 carton, five times daily -Free water: 60 ml before and after each feeding -Additional free water: 100 ml TID -Provides 1900 kcals, 85g protein and 1555 ml H2O   Addendum 1540: RD provided 1 complimentary case of Nutren 1.5 from [facility]. Education provided by [facility] at bedside. MD has consulted for TF initiation. Will order tube feeding in AM.   Will continue to monitor. If nutrition issues arise, please consult RD.   Morna Lee, MS, RD, LDN Inpatient Clinical Dietitian Contact via Secure chat

## 2025-01-16 NOTE — Assessment & Plan Note (Signed)
 S/p radiation therapy. -Follow-up with oncology

## 2025-01-16 NOTE — Procedures (Signed)
Interventional Radiology Procedure Note  Procedure: Percutaneous gastrostomy tube placement  Complications: None  Estimated Blood Loss: < 10 mL  Findings: 20 Fr bumper retention gastrostomy tube placed with tip in body of stomach. OK to use tomorrow.  Jodi Marble. Fredia Sorrow, M.D Pager:  [phone]

## 2025-01-16 NOTE — Assessment & Plan Note (Signed)
 Patient was seen by dietitian at cancer center and they recommended PEG tube placement as she was having difficulty eating and poor appetite. -IR was consulted s/p PEG tube placement today -Dietitian was consulted -PEG tube can be started tomorrow afternoon after 24-hour of placement -Patient will be high risk for refeeding and need monitoring of electrolytes, husband does not want to stay in hospital for that reason and patient will follow-up with her oncologist. -Continue with supportive care

## 2025-01-16 NOTE — Assessment & Plan Note (Signed)
 Dietitian was consulted to start tube feeding tomorrow.

## 2025-01-16 NOTE — Assessment & Plan Note (Signed)
 Potassium normal today with magnesium  of 1.6. - Replete magnesium  and monitor electrolytes

## 2025-01-16 NOTE — Assessment & Plan Note (Signed)
 Blood pressure currently within goal.  PCP has taken off her home medications due to poor p.o. intake and failure to thrive.

## 2025-01-16 NOTE — Progress Notes (Addendum)
 Pts Husband concerned about discharge home tomorrow. IR advised waiting at least 24 hours before using the PEG tube, which may not allow sufficient time for teaching or to assess tolerance for feeds tomorrow. Dietician touched base with husband yesterday about the risk for electrolyte abnormalities and that it would likely take a few days. Husband is upset, as he is adamant to discharge home tomorrow. RN explained the importance of confirming adequate tolerance before discharge to prevent a potential return to the ED. MD & Dietician notified. Plan is to start small volume boluses and advance slowly at home. All information will be added to AVS & Discharge RN to go over with patient.

## 2025-01-16 NOTE — TOC Initial Note (Signed)
 Transition of Care [facility]) - Initial/Assessment Note    Patient Details  Name: Diane Mayo MRN: [id number] Date of Birth: 30-Apr-1951  Transition of Care [facility]) CM/SW Contact:    Toy LITTIE Agar, RN Phone Number:[phone]  01/16/2025, 3:46 PM  Clinical Narrative:                 Inpatient care manager following patient for tube feeding needs, pam with [employer] is following to have tube feeds and supplies delivered. Teaching has been done at bedside today. Tube feed orders have been signed and faxed to [facility] for delivery to patient. Morna (dietician has also hand delivered tube feed supplies to the bedside just in care there are any delays with [employer] delivery. Tube feeds have not been started at this time. Above has been done in anticipation that patient will be discharged over the weekend.         Patient Goals and CMS Choice            Expected Discharge Plan and Services                                              Prior Living Arrangements/Services                       Activities of Daily Living   ADL Screening (condition at time of admission) Independently performs ADLs?: Yes (appropriate for developmental age) Is the patient deaf or have difficulty hearing?: No Does the patient have difficulty seeing, even when wearing glasses/contacts?: No Does the patient have difficulty concentrating, remembering, or making decisions?: No  Permission Sought/Granted                  Emotional Assessment              Admission diagnosis:  FTT (failure to thrive) in adult [R62.7] Head and neck cancer (HCC) [C76.0] Failure to thrive in adult [R62.7] Patient Active Problem List   Diagnosis Date Noted   Protein-calorie malnutrition, severe 01/15/2025   Electrolyte abnormality 01/15/2025   Failure to thrive in adult 01/14/2025   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) 08/27/2024   Hypertriglyceridemia  11/26/2023   Acute respiratory failure with hypoxia (HCC) 02/04/2022   Acute pulmonary edema (HCC)    Sepsis (HCC)    Spinal stenosis of lumbar region 02/01/2022   Essential hypertension 07/08/2019   Candidal vulvovaginitis 01/25/2019   Diabetes mellitus (HCC) 01/25/2019   Menopausal symptom 01/25/2019   Obesity 01/25/2019   LBBB (left bundle branch block) 01/10/2018   Acute cystitis with hematuria 12/10/2014   Burning with urination 11/11/2014   Frequency of urination 08/13/2013   Chronic interstitial cystitis 08/07/2012   PCP:  Sun, Vyvyan, MD Pharmacy:   [facility] - [place], [place] - [address] [place] [address] Phone: [phone] Fax: [phone]  [id number] - [place], [place] - [address] [place] [address] Phone: [phone] Fax: [phone]     Social Drivers of Health (SDOH) Social History: SDOH Screenings   Food Insecurity: No Food Insecurity (01/14/2025)  Housing: Low Risk (01/14/2025)  Transportation Needs: No Transportation Needs (01/14/2025)  Utilities: Not At Risk (01/14/2025)  Alcohol  Screen: Low Risk (12/31/2024)  Depression (PHQ2-9): Low Risk (12/31/2024)  Social Connections: Moderately Integrated (01/14/2025)  Stress: No Stress Concern Present (10/29/2024)  Tobacco Use: Medium Risk (01/14/2025)   SDOH Interventions:     Readmission Risk Interventions     No data to display

## 2025-01-16 NOTE — Progress Notes (Signed)
 " Progress Note   Patient: Diane Mayo FMW:[id number] DOB: [birth year] DOA: 01/14/2025     2 DOS: the patient was seen and examined on 01/16/2025   Brief hospital course: Partially taken from H&P.  ALESHKA CORNEY is a 74 y.o. 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  Patient reported progressively worsening difficulty swallowing, poor appetite.  On presentation hemodynamically stable, labs with potassium of 2.7, magnesium  1.6, TSH 2.550, A1c of 6.0.   Electrolytes were repleted and IR was consulted for PEG tube placement.  1/22: Vital normal, potassium at 3.3, magnesium  2.0.  Replating potassium.  Awaiting IR procedure hoping that can be done tomorrow otherwise it will be done on Monday.  Started on clear liquid diet  1/23: Remained hemodynamically stable, s/p PEG tube placement by IR in the afternoon today.  They want to wait 24 hours before starting feeding.  Dietitian was consulted.  Husband is very eager to go home by tomorrow.  He would like to get trained and instructions how to advance diet and will follow-up with her oncologist for lab work as she will be high risk for refeeding.   Assessment and Plan: * Failure to thrive in adult Patient was seen by dietitian at cancer center and they recommended PEG tube placement as she was having difficulty eating and poor appetite. -IR was consulted s/p PEG tube placement today -Dietitian was consulted -PEG tube can be started tomorrow afternoon after 24-hour of placement -Patient will be  high risk for refeeding and need monitoring of electrolytes, husband does not want to stay in hospital for that reason and patient will follow-up with her oncologist. -Continue with supportive care   Protein-calorie malnutrition, severe Dietitian was consulted to start tube feeding tomorrow.  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 -Follow-up with oncology  Electrolyte abnormality Potassium normal today with magnesium  of 1.6. - Replete magnesium  and monitor electrolytes  Essential hypertension Blood pressure currently within goal.  PCP has taken off her home medications due to poor p.o. intake and failure to thrive.  Diabetes mellitus (HCC) CBG within goal.  Patient was taken off from home medications by PCP. -SSI      Subjective: Patient was sitting comfortably in chair when seen today.  Awaiting PEG tube placement.  Husband was very eager to leave hospital.  Physical Exam: Vitals:   01/16/25 1250 01/16/25 1255 01/16/25 1300 01/16/25 1318  BP: (!) 149/91 (!) 157/89 (!) 155/87 139/70  Pulse: 90 88 86 80  Resp: 18 18 18 18   Temp:      TempSrc:      SpO2: 98% 99% 96% 97%  Weight:      Height:       General.  Frail and malnourished elderly lady, in no acute distress. Pulmonary.  Lungs clear bilaterally, normal respiratory effort. CV.  Regular rate and rhythm, no JVD, rub or murmur. Abdomen.  Soft, nontender, nondistended, BS positive. CNS.  Alert and oriented .  No focal neurologic deficit. Extremities.  No edema,  pulses intact and symmetrical. Psychiatry.  Judgment  and insight appears normal.   Data Reviewed: Prior data reviewed.  Family Communication: Discussed with husband at bedside  Disposition: Status is: Inpatient Remains inpatient appropriate because: Severity of illness  Planned Discharge Destination: Home with Home Health  Time spent: 44 minutes  This record has been created using  Conservation officer, historic buildings. Errors have been sought and corrected,but may not always be located. Such creation errors do not reflect on the standard of care.   Author: Amaryllis Dare, MD 01/16/2025 2:27 PM  For on call review [email or web address].  "

## 2025-01-17 DIAGNOSIS — R627 Adult failure to thrive: Secondary | ICD-10-CM | POA: Diagnosis not present

## 2025-01-17 DIAGNOSIS — E878 Other disorders of electrolyte and fluid balance, not elsewhere classified: Secondary | ICD-10-CM | POA: Diagnosis not present

## 2025-01-17 DIAGNOSIS — C4492 Squamous cell carcinoma of skin, unspecified: Secondary | ICD-10-CM | POA: Diagnosis not present

## 2025-01-17 DIAGNOSIS — E43 Unspecified severe protein-calorie malnutrition: Secondary | ICD-10-CM | POA: Diagnosis not present

## 2025-01-17 LAB — RENAL FUNCTION PANEL
Albumin: 3.4 g/dL — ABNORMAL LOW (ref 3.5–5.0)
Anion gap: 13 (ref 5–15)
BUN: <5 mg/dL — ABNORMAL LOW (ref 8–23)
CO2: 25 mmol/L (ref 22–32)
Calcium: 9 mg/dL (ref 8.9–10.3)
Chloride: 99 mmol/L (ref 98–111)
Creatinine, Ser: 0.62 mg/dL (ref 0.44–1.00)
GFR, Estimated: >60 mL/min
Glucose, Bld: 118 mg/dL — ABNORMAL HIGH (ref 70–99)
Phosphorus: 3.3 mg/dL (ref 2.5–4.6)
Potassium: 3.6 mmol/L (ref 3.5–5.1)
Sodium: 137 mmol/L (ref 135–145)

## 2025-01-17 LAB — MAGNESIUM: Magnesium: 2.2 mg/dL (ref 1.7–2.4)

## 2025-01-17 LAB — GLUCOSE, CAPILLARY
Glucose-Capillary: 115 mg/dL — ABNORMAL HIGH (ref 70–99)
Glucose-Capillary: 94 mg/dL (ref 70–99)

## 2025-01-17 MED ORDER — OXYCODONE HCL 5 MG PO TABS: 5.0000 mg | ORAL_TABLET | ORAL | 0 refills | Status: AC | PRN

## 2025-01-17 MED ORDER — OSMOLITE 1.5 CAL PO LIQD: 120.0000 mL | Freq: Three times a day (TID) | ORAL | 3 refills | Status: AC

## 2025-01-17 MED ORDER — OSMOLITE 1.2 CAL PO LIQD: 1000.0000 mL | ORAL | Status: DC

## 2025-01-17 MED ORDER — VITAMIN B-1 100 MG PO TABS: 100.0000 mg | ORAL_TABLET | Freq: Every day | ORAL | 0 refills | Status: AC

## 2025-01-17 MED ORDER — THIAMINE MONONITRATE 100 MG PO TABS
100.0000 mg | ORAL_TABLET | Freq: Every day | ORAL | Status: DC
  Administered 2025-01-17: 100 mg
  Filled 2025-01-17: qty 1

## 2025-01-17 MED ORDER — OSMOLITE 1.5 CAL PO LIQD
120.0000 mL | Freq: Three times a day (TID) | ORAL | Status: DC
  Administered 2025-01-17: 120 mL
  Filled 2025-01-17 (×2): qty 237

## 2025-01-17 MED ORDER — NYSTATIN 100000 UNIT/ML MT SUSP: 5.0000 mL | Freq: Four times a day (QID) | OROMUCOSAL | 0 refills | Status: AC

## 2025-01-17 NOTE — Discharge Summary (Signed)
 " Physician Discharge Summary   Patient: Diane Mayo MRN: [id number] DOB: 1951-04-23  Admit date:     01/14/2025  Discharge date: 01/17/25  Discharge Physician: Amaryllis Dare   PCP: Sun, Vyvyan, MD   Recommendations at discharge:  Please obtain CBC, renal function, and magnesium  levels on follow-up Please monitor for any refeeding syndrome Please monitor that she appropriately achieve her goal of tube feeding. 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  Diane Mayo is a 74 y.o. 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  Patient reported progressively worsening difficulty swallowing, poor appetite.  On presentation hemodynamically stable, labs with potassium of 2.7, magnesium  1.6, TSH 2.550, A1c of 6.0.   Electrolytes were repleted and IR was consulted for PEG tube placement.  1/22: Vital normal, potassium at 3.3, magnesium  2.0.  Replating potassium.  Awaiting IR procedure hoping that can be done tomorrow otherwise it will be done on Monday.  Started on clear liquid diet  1/23: Remained hemodynamically stable, s/p PEG tube placement by IR in the afternoon today.  They want to  wait 24 hours before starting feeding.  Dietitian was consulted.  Husband is very eager to go home by tomorrow.  He would like to get trained and instructions how to advance diet and will follow-up with her oncologist for lab work as she will be high risk for refeeding.  1/24: Remained hemodynamically stable.  Electrolytes normal.  Dietitian placed the recommendations for tube feeding which she will slowly increase to the goal.  Patient and husband were given the tube feed education.  She was given some oxycodone  at her request to use only if simple Tylenol  does not help.  Patient was instructed to have a close follow-up with her oncologist and have her electrolytes monitored as she will be high risk for refeeding syndrome.  She will continue the rest of her home medications and follow-up with her providers for further assistance.  Assessment and Plan: * Failure to thrive in adult Patient was seen by dietitian at cancer center and they recommended PEG tube placement as she was having difficulty eating and poor appetite. -IR was consulted s/p PEG tube placement on 01/16/2025 -Dietitian was consulted -Started on PEG tube feeding and she will slowly achieve her goals as recommended by our dietitian. -Patient will be high risk for refeeding and need monitoring of electrolytes, husband does not want to stay in hospital for that reason and patient will follow-up with her oncologist. -Continue with supportive care   Protein-calorie malnutrition, severe Dietitian was consulted and patient was started on tube feed.  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 -Follow-up with oncology  Electrolyte abnormality Electrolytes normal today  Essential hypertension Blood pressure currently within goal.  PCP has taken off her home medications due to poor p.o. intake and failure to thrive.  Diabetes mellitus (HCC) CBG within goal.  Patient  was taken off from home medications by PCP.     Pain control - [place]  Controlled Substance Reporting System database was reviewed. and patient was instructed, not to drive, operate heavy machinery, perform activities at heights, swimming or participation in water activities or provide baby-sitting services while on Pain, Sleep and Anxiety Medications; until their outpatient Physician has advised to do so again. Also recommended to not to take more than prescribed Pain, Sleep and Anxiety Medications.  Consultants: IR Procedures performed: PEG tube placement Disposition: Home Diet recommendation:  Full liquid diet along with tube feeding DISCHARGE MEDICATION: Allergies as of 01/17/2025       Reactions   Penicillins Shortness Of Breath, Itching, Rash   Accupril [quinapril Hcl] Cough   Januvia [sitagliptin] Other (See Comments)   thrush   Ondansetron  Hcl Other (See Comments)   Too strong   Polysaccharide Iron Complex Diarrhea, Nausea Only   Constipation    Monistat [miconazole] Itching, Swelling   Monistat    Tioconazole Rash   Other reaction(s): rash   Zestril [lisinopril] Cough        Medication List     STOP taking these medications    ciprofloxacin 500 MG tablet Commonly known as: CIPRO   fluconazole  100 MG tablet Commonly known as: DIFLUCAN    hydrochlorothiazide  12.5 MG capsule Commonly known as: MICROZIDE    HYDROcodone -acetaminophen  5-325 MG tablet Commonly known as: NORCO/VICODIN   icosapent  Ethyl 1 g capsule Commonly known as: Vascepa    irbesartan  300 MG tablet Commonly known as: AVAPRO    metoprolol  tartrate 25 MG tablet Commonly known as: LOPRESSOR    Pentosan Polysulfate Sodium  200 MG Cpdr   rosuvastatin  10 MG tablet Commonly known as: CRESTOR    scopolamine  1 MG/3DAYS Commonly known as: TRANSDERM-SCOP   trimethoprim  100 MG tablet Commonly known as: TRIMPEX    valACYclovir  500 MG tablet Commonly known as: VALTREX        TAKE these  medications    allopurinol  300 MG tablet Commonly known as: ZYLOPRIM  Take 300 mg by mouth in the morning.   amLODipine  10 MG tablet Commonly known as: NORVASC  Take 10 mg by mouth in the morning.   artificial tears ophthalmic solution Place 1 drop into both eyes as needed for dry eyes.   bacitracin  ointment Apply to neck incision twice daily for 7 days What changed:  how much to take how to take this when to take this   colchicine  0.6 MG tablet TAKE 1 TABLET BY MOUTH TWICE A DAY What changed:  when to take this reasons to take this   feeding supplement (OSMOLITE 1.5 CAL) Liqd Place 120 mLs into feeding tube 3 (three) times daily. Slowly increase to 1 carton as directed   fenofibrate  48 MG tablet Commonly known as: TRICOR  TAKE 1 TABLET BY MOUTH DAILY   ibuprofen 200 MG tablet Commonly known as: ADVIL Take 400 mg by mouth every 6 (six) hours as needed for mild pain (pain score 1-3) or moderate pain (pain score 4-6).   LORazepam  0.5 MG tablet Commonly known as: ATIVAN  Take 1 tablet (0.5 mg total) by mouth every 8 (eight) hours as needed.   magic mouthwash (nystatin , lidocaine , diphenhydrAMINE, alum & mag hydroxide) suspension Swish and spit 15 mLs 4 (four) times daily as needed for mouth pain.   metFORMIN  1000 MG tablet Commonly known as: GLUCOPHAGE  Take 1,000 mg  by mouth 2 (two) times daily with a meal.   nystatin  100000 UNIT/ML suspension Commonly known as: MYCOSTATIN  Take 5 mLs (500,000 Units total) by mouth 4 (four) times daily.   nystatin  cream Commonly known as: MYCOSTATIN  Apply 1 application topically daily as needed for rash.   oxyCODONE  5 MG immediate release tablet Commonly known as: Oxy IR/ROXICODONE  Take 1 tablet (5 mg total) by mouth every 4 (four) hours as needed for moderate pain (pain score 4-6) or severe pain (pain score 7-10).   pantoprazole  40 MG tablet Commonly known as: PROTONIX  Take 40 mg by mouth 2 (two) times daily.   PreviDent 5000  Sensitive 1.1-5 % Gel Generic drug: Sod Fluoride-Potassium Nitrate Place 1 Application onto teeth daily.   promethazine  25 MG tablet Commonly known as: PHENERGAN  Take 1 tablet (25 mg total) by mouth every 6 (six) hours as needed for nausea or vomiting.   thiamine  100 MG tablet Commonly known as: Vitamin B-1 Place 1 tablet (100 mg total) into feeding tube daily. Start taking on: January 18, 2025   triamcinolone  cream 0.1 % Commonly known as: KENALOG  Apply 1 Application topically 2 (two) times daily.        Follow-up Information     Sun, Vyvyan, MD. Schedule an appointment as soon as possible for a visit in 1 week(s).   Specialty: Family Medicine Contact information: [phone] [address] [place] [address] [phone]                Discharge Exam: Diane Mayo   01/14/25 1751  Weight: 74.8 kg   General.  Malnourished, obese elderly lady, in no acute distress. Pulmonary.  Lungs clear bilaterally, normal respiratory effort. CV.  Regular rate and rhythm, no JVD, rub or murmur. Abdomen.  Soft, nontender, nondistended, BS positive. CNS.  Alert and oriented .  No focal neurologic deficit. Extremities.  No edema,  pulses intact and symmetrical. Psychiatry.  Judgment and insight appears normal.   Condition at discharge: stable  The results of significant diagnostics from this hospitalization (including imaging, microbiology, ancillary and laboratory) are listed below for reference.   Imaging Studies: IR GASTROSTOMY TUBE MOD SED Result Date: 01/16/2025 CLINICAL DATA:  Squamous carcinoma of the head and neck with difficulty swallowing and malnutrition. Request to place a percutaneous gastrostomy tube for nutritional needs. EXAM: PERCUTANEOUS GASTROSTOMY TUBE PLACEMENT ANESTHESIA/SEDATION: Moderate (conscious) sedation was employed during this procedure. A total of Versed  2.0 mg and Fentanyl  100 mcg was administered intravenously. Moderate Sedation Time: 11  minutes. The patient's level of consciousness and vital signs were monitored continuously by radiology nursing throughout the procedure under my direct supervision. CONTRAST:  20mL OMNIPAQUE  IOHEXOL  300 MG/ML  SOLN MEDICATIONS: 1 g IV vancomycin . IV antibiotic was administered in an appropriate time interval prior to needle puncture of the skin. 0.5 mg IV glucagon  FLUOROSCOPY: Radiation Exposure Index: 20 mGy Kerma. PROCEDURE: The procedure, risks, benefits, and alternatives were explained to the patient. Questions regarding the procedure were encouraged and answered. The patient understands and consents to the procedure. A timeout was performed prior to initiating the procedure. A 5-French catheter was then advanced through the patient's mouth under fluoroscopy into the esophagus and to the level of the stomach. This catheter was used to insufflate the stomach with air under fluoroscopy. The abdominal wall was prepped with chlorhexidine  in a sterile fashion, and a sterile drape was applied covering the operative field. A sterile gown and sterile gloves were used for the procedure. Local anesthesia  was provided with 1% Lidocaine . A skin incision was made in the upper abdominal wall. Under fluoroscopy, a T tack needle was advanced into the stomach. Contrast injection was performed to confirm intraluminal position of the needle tip. A single T tack was then deployed in the lumen of the stomach. This was brought up to tension at the skin surface. Adjacent puncture of the stomach was then performed with an 18 gauge trocar needle. Contrast was injected to confirm intraluminal positioning within the stomach. A guidewire was advanced into the stomach. Over a guidewire, a 9-French sheath was advanced into the lumen of the stomach. The wire was left in place as a safety wire. A loop snare device from a percutaneous gastrostomy kit was then advanced into the stomach. A floppy guide wire was advanced through the orogastric  catheter under fluoroscopy in the stomach. The loop snare advanced through the percutaneous gastric access was used to snare the guide wire. This allowed withdrawal of the loop snare out of the patient's mouth by retraction of the orogastric catheter and wire. A 20-French bumper retention gastrostomy tube was looped around the snare device. It was then pulled back through the patient's mouth. The retention bumper was brought up to the anterior gastric wall. The T tack suture was cut at the skin. The exiting gastrostomy tube was cut to appropriate length and a feeding adapter applied. The catheter was injected with contrast material to confirm position and a fluoroscopic spot image saved. The tube was then flushed with saline. A dressing was applied over the gastrostomy exit site. COMPLICATIONS: None. FINDINGS: The stomach distended well with air allowing safe placement of the gastrostomy tube. After placement, the tip of the gastrostomy tube lies in the body of the stomach. IMPRESSION: Percutaneous gastrostomy with placement of a 20-French bumper retention tube in the body of the stomach. This tube can be used for percutaneous feeds beginning in 24 hours after placement. Electronically Signed   By: Marcey Moan M.D.   On: 01/16/2025 16:34   DG Abd 1 View Result Date: 01/16/2025 CLINICAL DATA:  Preop for gastrostomy tube placement EXAM: ABDOMEN - 1 VIEW COMPARISON:  None Available. FINDINGS: The bowel gas pattern is normal. Contrast is noted in nondilated colon. No radio-opaque calculi or other significant radiographic abnormality are seen. IMPRESSION: No abnormal bowel dilatation. Electronically Signed   By: Lynwood Landy Raddle M.D.   On: 01/16/2025 07:34    Microbiology: Results for orders placed or performed during the hospital encounter of 02/03/22  Resp Panel by RT-PCR (Flu A&B, Covid) Nasopharyngeal Swab     Status: None   Collection Time: 02/04/22 12:22 AM   Specimen: Nasopharyngeal Swab;  Nasopharyngeal(NP) swabs in vial transport medium  Result Value Ref Range Status   SARS Coronavirus 2 by RT PCR NEGATIVE NEGATIVE Final    Comment: (NOTE) SARS-CoV-2 target nucleic acids are NOT DETECTED.  The SARS-CoV-2 RNA is generally detectable in upper respiratory specimens during the acute phase of infection. The lowest concentration of SARS-CoV-2 viral copies this assay can detect is 138 copies/mL. A negative result does not preclude SARS-Cov-2 infection and should not be used as the sole basis for treatment or other patient management decisions. A negative result may occur with  improper specimen collection/handling, submission of specimen other than nasopharyngeal swab, presence of viral mutation(s) within the areas targeted by this assay, and inadequate number of viral copies(<138 copies/mL). A negative result must be combined with clinical observations, patient history, and epidemiological information. The  expected result is Negative.  Fact Sheet for Patients:  bloggercourse.com  Fact Sheet for Healthcare Providers:  seriousbroker.it  This test is no t yet approved or cleared by the [place]  FDA and  has been authorized for detection and/or diagnosis of SARS-CoV-2 by FDA under an Emergency Use Authorization (EUA). This EUA will remain  in effect (meaning this test can be used) for the duration of the COVID-19 declaration under Section 564(b)(1) of the Act, 21 U.S.C.section 360bbb-3(b)(1), unless the authorization is terminated  or revoked sooner.       Influenza A by PCR NEGATIVE NEGATIVE Final   Influenza B by PCR NEGATIVE NEGATIVE Final    Comment: (NOTE) The Xpert Xpress SARS-CoV-2/FLU/RSV plus assay is intended as an aid in the diagnosis of influenza from Nasopharyngeal swab specimens and should not be used as a sole basis for treatment. Nasal washings and aspirates are unacceptable for Xpert Xpress  SARS-CoV-2/FLU/RSV testing.  Fact Sheet for Patients: bloggercourse.com  Fact Sheet for Healthcare Providers: seriousbroker.it  This test is not yet approved or cleared by the [place]  FDA and has been authorized for detection and/or diagnosis of SARS-CoV-2 by FDA under an Emergency Use Authorization (EUA). This EUA will remain in effect (meaning this test can be used) for the duration of the COVID-19 declaration under Section 564(b)(1) of the Act, 21 U.S.C. section 360bbb-3(b)(1), unless the authorization is terminated or revoked.  Performed at [facility], [address], [place], [place] [address]   Blood culture (routine x 2)     Status: None   Collection Time: 02/04/22  2:30 AM   Specimen: BLOOD  Result Value Ref Range Status   Specimen Description BLOOD SITE NOT SPECIFIED  Final   Special Requests   Final    BOTTLES DRAWN AEROBIC AND ANAEROBIC Blood Culture adequate volume   Culture   Final    NO GROWTH 5 DAYS Performed at [facility], [address], [place], [place] [address]    Report Status 02/09/2022 FINAL  Final  Blood culture (routine x 2)     Status: None   Collection Time: 02/04/22  2:53 AM   Specimen: BLOOD  Result Value Ref Range Status   Specimen Description BLOOD SITE NOT SPECIFIED  Final   Special Requests   Final    BOTTLES DRAWN AEROBIC AND ANAEROBIC Blood Culture adequate volume   Culture   Final    NO GROWTH 5 DAYS Performed at [facility], [address], [place], [place] [address]    Report Status 02/09/2022 FINAL  Final  Urine Culture     Status: Abnormal   Collection Time: 02/04/22  3:04 AM   Specimen: Urine, Clean Catch  Result Value Ref Range Status   Specimen Description URINE, CLEAN CATCH  Final   Special Requests   Final    NONE Performed at [facility], [address], [place], [place] [address]    Culture 40,000 COLONIES/mL [facility] MORGANII (A)   Final   Report Status 02/06/2022 FINAL  Final   Organism ID, Bacteria MORGANELLA MORGANII (A)  Final      Susceptibility   Morganella morganii - MIC*    AMPICILLIN >=32 RESISTANT Resistant     CEFAZOLIN  >=64 RESISTANT Resistant     CIPROFLOXACIN <=0.25 SENSITIVE Sensitive     GENTAMICIN <=1 SENSITIVE Sensitive     IMIPENEM 2 SENSITIVE Sensitive     NITROFURANTOIN RESISTANT Resistant     TRIMETH/SULFA >=  320 RESISTANT Resistant     AMPICILLIN/SULBACTAM >=32 RESISTANT Resistant     PIP/TAZO <=4 SENSITIVE Sensitive     * 40,000 COLONIES/mL MORGANELLA MORGANII  MRSA Next Gen by PCR, Nasal     Status: None   Collection Time: 02/04/22  9:24 AM   Specimen: Nasal Mucosa; Nasal Swab  Result Value Ref Range Status   MRSA by PCR Next Gen NOT DETECTED NOT DETECTED Final    Comment: (NOTE) The GeneXpert MRSA Assay (FDA approved for NASAL specimens only), is one component of a comprehensive MRSA colonization surveillance program. It is not intended to diagnose MRSA infection nor to guide or monitor treatment for MRSA infections. Test performance is not FDA approved in patients less than 67 years old. Performed at [facility], [address], [place], [place] [address]     Labs: CBC: Recent Labs  Lab 01/14/25 1841  WBC 5.1  NEUTROABS 3.4  HGB 11.8*  HCT 38.4  MCV 77.9*  PLT 209   Basic Metabolic Panel: Recent Labs  Lab 01/14/25 1841 01/15/25 0609 01/16/25 0840 01/17/25 0617  NA 137 136 139 137  K 2.7* 3.3* 3.6 3.6  CL 97* 99 102 99  CO2 24 24 26 25   GLUCOSE 105* 99 108* 118*  BUN 7* <5* <5* <5*  CREATININE 0.61 0.60 0.55 0.62  CALCIUM  9.4 8.9 8.9 9.0  MG 1.6* 2.0 1.6* 2.2  PHOS 3.1  --  2.9 3.3   Liver Function Tests: Recent Labs  Lab 01/14/25 1841 01/16/25 0840 01/17/25 0617  AST 26  --   --   ALT 12  --   --   ALKPHOS 80  --   --   BILITOT 0.7  --   --   PROT 6.7  --   --   ALBUMIN  3.7 3.4* 3.4*   CBG: Recent Labs  Lab 01/16/25 0742 01/16/25 1134  01/16/25 1626 01/16/25 2215 01/17/25 0750  GLUCAP 105* 110* 80 121* 115*    Discharge time spent: greater than 30 minutes.  This record has been created using Conservation officer, historic buildings. Errors have been sought and corrected,but may not always be located. Such creation errors do not reflect on the standard of care.   Signed: Amaryllis Dare, MD [facility] 01/17/2025 "

## 2025-01-17 NOTE — Plan of Care (Signed)
  Problem: Education: Goal: Knowledge of General Education information will improve Description: Including pain rating scale, medication(s)/side effects and non-pharmacologic comfort measures Outcome: Progressing   Problem: Health Behavior/Discharge Planning: Goal: Ability to manage health-related needs will improve Outcome: Progressing   Problem: Clinical Measurements: Goal: Will remain free from infection Outcome: Progressing Goal: Cardiovascular complication will be avoided Outcome: Progressing   Problem: Clinical Measurements: Goal: Ability to maintain clinical measurements within normal limits will improve Outcome: Not Progressing Goal: Diagnostic test results will improve Outcome: Not Progressing Goal: Respiratory complications will improve Outcome: Not Progressing

## 2025-01-17 NOTE — Progress Notes (Signed)
 Nutrition Follow-up  DOCUMENTATION CODES:   Severe malnutrition in context of chronic illness  INTERVENTION:   Patient at risk of refeeding syndrome. -Recommend Thiamine  100 mg daily for at least 7 days  -MD recommending patient follow-up for labs at [facility]  Starting today: -Provide 1/2 carton of Osmolite 1.5 TID -Flush with 60 ml free water before and after each feeding   At discharge, pt will be followed by [facility] for tube feeding supplies. Recommendations for after discharge: -5 cartons daily of Nutren 1.5 -Provide 1 carton, five times daily -Free water: 60 ml before and after each feeding -Additional free water: 100 ml TID -Provides 1900 kcals, 85g protein and 1855 ml H2O -Patient to increase by 1/2 carton each day towards goal as tolerated   NUTRITION DIAGNOSIS:   Severe Malnutrition related to chronic illness, cancer and cancer related treatments as evidenced by moderate fat depletion, moderate muscle depletion, percent weight loss, energy intake < or equal to 75% for > or equal to 1 month.  Ongoing.  GOAL:   Patient will meet greater than or equal to 90% of their needs  Not meeting yet. TF to start today.  MONITOR:   TF tolerance  REASON FOR ASSESSMENT:   Consult Enteral/tube feeding initiation and management  ASSESSMENT:   74 y.o. 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  1/23: PEG placed in IR  RD has placed tube feeding orders to start bolus-gravity feeds today. Pt will need to advance  slowly given refeeding risk. MD has recommended pt to have lab work with oncology.  RD provided tube feeding education 1/23. [facility] met with patient at bedside 1/23 in anticipation of possible discharge today. RD at [facility] will follow-up next week with patient.  Admission weight: 165 lbs  Medications: nystatin   Labs reviewed:  CBGs: 115  Diet Order:   Diet Order             Diet clear liquid Room service appropriate? Yes; Fluid consistency: Thin  Diet effective now                   EDUCATION NEEDS:   Education needs have been addressed  Skin:  Skin Assessment: Reviewed RN Assessment  Last BM:  1/22  Height:   Ht Readings from Last 1 Encounters:  01/14/25 5' (1.524 m)    Weight:   Wt Readings from Last 1 Encounters:  01/14/25 74.8 kg    BMI:  Body mass index is 32.22 kg/m.  Estimated Nutritional Needs:   Kcal:  1800-2000  Protein:  75-90g  Fluid:  2L/day   Morna Lee, MS, RD, LDN Inpatient Clinical Dietitian Contact via Secure chat

## 2025-01-17 NOTE — TOC Transition Note (Signed)
 Transition of Care [facility]) - Discharge Note   Patient Details  Name: Diane Mayo MRN: [id number] Date of Birth: 29-Dec-1950  Transition of Care [facility]) CM/SW Contact:  Heather DELENA Saltness, LCSW Phone Number: 01/17/2025, 11:11 AM   Clinical Narrative:    Pt discharging home today with tube feedings through [facility]. CSW spoke with Holley Herring, who confirmed delivery of supplies to pt's home yesterday evening. No further TOC needs at this time.   Final next level of care: Home/Self Care Barriers to Discharge: Barriers Resolved   Patient Goals and CMS Choice Patient states their goals for this hospitalization and ongoing recovery are:: To return home CMS Medicare.gov Compare Post Acute Care list provided to:: Patient Choice offered to / list presented to : Patient [place] ownership interest in [facility].provided to:: Patient    Discharge Placement Home   Patient to be transferred to facility by: Spouse Name of family member notified: Patient Patient and family notified of of transfer: 01/17/25  Discharge Plan and Services Additional resources added to the After Visit Summary for  Follow Up                DME Arranged: Tube feeding DME Agency: Other - Comment [place]) Date DME Agency Contacted: 01/17/25 Time DME Agency Contacted: 1110 Representative spoke with at DME Agency: Holley Herring [facility] Arranged: TPN HH Agency: [facility] Date HH Agency Contacted: 01/17/25 Time HH Agency Contacted: 1111 Representative spoke with at [facility] Agency: Holley Herring  Social Drivers of Health (SDOH) Interventions SDOH Screenings   Food Insecurity: No Food Insecurity (01/14/2025)  Housing: Low Risk (01/14/2025)  Transportation Needs: No Transportation Needs (01/14/2025)  Utilities: Not At Risk (01/14/2025)  Alcohol  Screen: Low Risk (12/31/2024)  Depression (PHQ2-9): Low Risk (12/31/2024)  Social Connections: Moderately Integrated (01/14/2025)  Stress: No Stress Concern Present (10/29/2024)   Tobacco Use: Medium Risk (01/14/2025)     Readmission Risk Interventions     No data to display           Signed: Heather Saltness, MSW, LCSW Clinical Social Worker Inpatient Care Management 01/17/2025 11:13 AM

## 2025-01-17 NOTE — Plan of Care (Signed)

## 2025-01-20 ENCOUNTER — Telehealth: Payer: Self-pay | Admitting: Dietician

## 2025-01-20 ENCOUNTER — Ambulatory Visit

## 2025-01-20 DIAGNOSIS — R293 Abnormal posture: Secondary | ICD-10-CM | POA: Diagnosis present

## 2025-01-20 DIAGNOSIS — L599 Disorder of the skin and subcutaneous tissue related to radiation, unspecified: Secondary | ICD-10-CM

## 2025-01-20 DIAGNOSIS — R262 Difficulty in walking, not elsewhere classified: Secondary | ICD-10-CM

## 2025-01-20 DIAGNOSIS — C7989 Secondary malignant neoplasm of other specified sites: Secondary | ICD-10-CM

## 2025-01-20 DIAGNOSIS — C4492 Squamous cell carcinoma of skin, unspecified: Secondary | ICD-10-CM | POA: Diagnosis present

## 2025-01-20 DIAGNOSIS — M6281 Muscle weakness (generalized): Secondary | ICD-10-CM | POA: Diagnosis present

## 2025-01-20 NOTE — Telephone Encounter (Signed)
 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 She completed 30 fractions of radiation therapy. S/p b/L tonsillectomy 10/3. Last radiation on 12/15   1/21-1/24 admission with FTT s/p PEG on 1/23 DME: [facility]  Spoke with patient via telephone. Says she is doing well since hospital discharge. Patient feeling much better. Able to speak without pain. Patient is titrating tube feedings and tolerating well. She did 2 cartons yesterday. She has done 1 1/2 carton today and plans to do another carton. Patient and husband have no questions about bolus feeds or PEG care.    Medications: reviewed   Labs: 1/24 - reviewed   Anthropometrics: Wt 161 lb on 1/21  1/7 - 178 lb 3.2 oz  12/2 - 191 lb 12 oz  10/15 - 205 lb 6.4 oz    Estimated Energy Needs  Kcals: 2025-2400 Protein: 97-121 Fluid: >2 L  NUTRITION DIAGNOSIS: Food and nutrition related knowledge deficit improved    MALNUTRITION DIAGNOSIS: Severe malnutrition diagnosed by inpt RD 1/22   INTERVENTION:  Continue working to increase tube feedings to goal 1 carton Nutren 1.5 via PEG 5x/day. Flush with 60 ml water before and after bolus. Additional water 100 ml TID. Provides 1900 kcal, 85g, 1855 ml total water   MONITORING, EVALUATION, GOAL: wt trends, TF, po   NEXT VISIT: To be determined

## 2025-01-20 NOTE — Therapy (Signed)
 " OUTPATIENT PHYSICAL THERAPY HEAD AND NECK POST RADIATION FOLLOW UP   Patient Name: Diane Mayo MRN: [id number] DOB:Feb 12, 1951, 74 y.o., female Today's Date: 01/20/2025  END OF SESSION:  PT End of Session - 01/20/25 1353     Visit Number 5    Number of Visits 10    Date for Recertification  01/29/25    PT Start Time 1356    PT Stop Time 1445    PT Time Calculation (min) 49 min    Activity Tolerance Patient tolerated treatment well    Behavior During Therapy WFL for tasks assessed/performed          Past Medical History:  Diagnosis Date   Anemia    Diabetes mellitus without complication (HCC)    Type 2   Family history of adverse reaction to anesthesia    mother slow to wake up   Genital herpes    GERD (gastroesophageal reflux disease)    Hypertension    Interstitial cystitis    Left bundle branch block (LBBB)    chronic   Leukocytosis    NASH (nonalcoholic steatohepatitis)    Osteoarthritis    Plantar fasciitis    Past Surgical History:  Procedure Laterality Date   BACK SURGERY     L3 L4   CARPAL TUNNEL RELEASE Bilateral    CESAREAN SECTION     twice 1978 and 1987   CHOLECYSTECTOMY     DILATION AND CURETTAGE OF UTERUS  1975   DIRECT LARYNGOSCOPY N/A 09/26/2024   Procedure: LARYNGOSCOPY, DIRECT;  Surgeon: Tobie Eldora NOVAK, MD;  Location: [facility] OR;  Service: ENT;  Laterality: N/A;   EYE SURGERY Bilateral 2021   cataracts removed   IR GASTROSTOMY TUBE MOD SED  01/16/2025   RADICAL NECK DISSECTION Right 09/26/2024   Procedure: DISSECTION, NECK, RADICAL;  Surgeon: Tobie Eldora NOVAK, MD;  Location: [facility] OR;  Service: ENT;  Laterality: Right;   SHOULDER SURGERY Right 2012   rotator cuff repair   TONSILLECTOMY Bilateral 09/26/2024   Procedure: TONSILLECTOMY;  Surgeon: Tobie Eldora NOVAK, MD;  Location: [facility] OR;  Service: ENT;  Laterality: Bilateral;   TUBAL LIGATION  1987   Patient Active Problem List   Diagnosis Date Noted   Protein-calorie malnutrition, severe 01/15/2025    Electrolyte abnormality 01/15/2025   Failure to thrive in adult 01/14/2025   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) 08/27/2024   Hypertriglyceridemia 11/26/2023   Acute respiratory failure with hypoxia (HCC) 02/04/2022   Acute pulmonary edema (HCC)    Sepsis (HCC)    Spinal stenosis of lumbar region 02/01/2022   Essential hypertension 07/08/2019   Candidal vulvovaginitis 01/25/2019   Diabetes mellitus (HCC) 01/25/2019   Menopausal symptom 01/25/2019   Obesity 01/25/2019   LBBB (left bundle branch block) 01/10/2018   Acute cystitis with hematuria 12/10/2014   Burning with urination 11/11/2014   Frequency of urination 08/13/2013   Chronic interstitial cystitis 08/07/2012    PCP: Arnett Repress, MD   REFERRING PROVIDER: Dr. 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 The Peg worries me and I am afraid I am going to catch it on things. Not too sore. I was tired after last visit, but I did OK. My throat is a 2/10 today.     PERTINENT HISTORY:  SCC metastatic to right cervical lymph node, unknown primary, stage IVA (T0 N2 M0 p 16 +. She presented to her PCP in late January of 2025 with concerns over a mass on her right neck. She  underwent a head and neck US  on 03/18/24 showing a 4.5 cm complex cystic mass in or adjacent to the right parotid gland. To further investigate her symptoms she then underwent a neck CT on 04/09/24 showing a  right level 2A there is a predominantly cystic lesion measuring up to 3.2 cm in greatest axial dimension.   07/16/24 She saw Dr. Tobie for a consult regarding her mass. She then underwent a fine needle aspiration biopsy of right parotid with pathology indicating squamous cell carcinoma. Immunohistochemical stain for p16 was performed. The stain show increased staining in the basal layer of the squamous epithelium but overall it is patchy. There is no definitive evidence of block like pattern of staining. The finding does not support a HPV related squamous cell carcinoma... The possibility of squamous cell carcinoma arising from a branchial cleft cyst is considered. However, a metastatic squamous cell carcinoma cannot be excluded. 08/15/24 PET showed a right-sided neck mass demonstrates low level heterogeneous peripheral FDG accumulation with SUV of 4.3 with no other hypermetabolic lymphadenopathy in the neck. No evidence for hypermetabolic metastatic disease in the chest, abdomen, or pelvis. 08/14/24 follow up with Dr. Tobie the patient opted to proceed with bilateral tonsillectomy and radical neck dissection (1/20 nodes) 09/26/24 (delayed due to a grandchild's wedding). She will receive 30 fractions of radiation to her Oropharynx and bilateral neck which started on 10/27/24 and complete 12/08/24. 09/26/24 Bilateral tonsillectomy with biopsies. Hx of back surgery L3/L4  PATIENT GOALS:  Reassess how my recovery is going related to neck ROM, cervical pain, fatigue, and swelling.  PAIN:  Are you having pain? Yes NPRS scale: 2/10 Pain location: throat Pain orientation: Right and Left  PAIN TYPE: sore Pain description: intermittent  Aggravating factors: when swallowing, breathing hard, or eat Relieving  factors: drinking warm liquids  PRECAUTIONS: Recent radiation, Head and neck lymphedema risk,    OBJECTIVE:   POSTURE:  Forward head and rounded shoulders posture  30 SEC SIT TO STAND: 01/01/25: 8 reps in 30 sec without use of UEs which is poor for pt's age At baseline eval on 10/30/24: 10 reps in 30 sec without use of UEs which is  Below average for patient's age   SHOULDER AROM:   [facility]     CERVICAL AROM:     Percent limited baseline on 10/30/24 01/01/25  Flexion WFL WFL  Extension [facility] WFL  Right lateral flexion 25% limited WFL  Left lateral flexion 25% limited 25% limited  Right rotation [facility] WFL  Left rotation WFL WFL                          (Blank rows=not tested)    LYMPHEDEMA ASSESSMENT:    Circumference in cm  4 cm superior to sternal notch around neck 35.2  6 cm superior to sternal notch around neck 36.5  8 cm superior to sternal notch around neck 39.5  R lateral nostril from base  of nose to medial tragus   L lateral nostril from base of nose to medial tragus   R corner of mouth to where ear lobe meets face   L corner of mouth to where ear lobe meets face         (Blank rows=not tested)    LOWER EXTREMITY MMT: unable to roll to prone to test hip extension  MMT Right eval  Hip flexion 3+/5  Hip extension   Hip abduction 3/5  Hip adduction   Hip internal rotation   Hip external rotation   Knee flexion 4+/5  Knee extension 5/5  Ankle dorsiflexion 4/5  Ankle plantarflexion   Ankle inversion   Ankle eversion    (Blank rows = not tested)  MMT LEFT eval  Hip flexion 3+/5  Hip extension   Hip abduction 3/5  Hip adduction   Hip internal rotation   Hip external rotation   Knee flexion 4+/5  Knee extension 5/5  Ankle dorsiflexion 4/5  Ankle plantarflexion   Ankle inversion   Ankle eversion    (Blank rows = not tested)  SINGLE LIMB STANCE: R 3 sec, L 1 sec  CURRENT/PAST TREATMENTS:  Surgery type/date: 09/26/24- R neck dissection  Chemotherapy:  not required   Radiation:completed 12/08/24  OTHER SYMPTOMS: Pain Yes Fibrosis No Pitting edema No Infections No Decreased scar mobility Yes  TREATMENT PERFORMED: 01/20/2025 Nustep level 1 seat at 5, UEs at 7, x  5 min to warm up Sit to stand 2 x 5 Seated scapular retraction x 10 Marching 2 x 5 reps LAQ 2 x 5 ea Ball squeeze 2 x 5, 5 sec holds Hip abduction seated x 10 yellow band Standing foot placement on 6 in step x 5 B no HH Supine snow angels x 10, 5 sec hold Supine shoulder alphabet B x 1 Supine ER with yellow band x 10 Supine march x 5 B Supine horizontal abd yellow band x 10 01/13/2025 Discussed progress and pt/husband concerns Messaged Dr. Izell for pt. Nustep level 1 seat at 5, UEs at 5, x 10 min to warm up Marching 1 #, x 10, x 5 reps reps with pt returning therapist demo LAQ with 5 sec holds  1# 2 x 10 reps, Ball squeezes with 5 sec holds x 10 reps, x 5 reps Sit to stands x 3 x 5 reps without hands  Hip abduction with yellow band x 10 reps Educated in sit to and from supine on mat table Supine scapular series with yellow, narrow and wide grip flexion x 10 ea Supine Horizontal abd x 10 ea yellow Snow angels x 5 ea 01/07/25: Nustep level 1 seat at 5, UEs at 5, x 10 min to warm up Seated edge of mat: Marching x 10 reps with pt returning therapist demo LAQ with 5 sec holds x 10 reps Ball squeezes with 5 sec holds x 10 reps Hip abduction with yellow medium loop band x 10 reps Sit to stands x 10 reps with v/c for initial foot placement Supine on large mat table: Gentle over pressure to R anterior shoulder for pec stretch Lying arm outstretched on mat table at about 115 degrees of abduction for pec stretch with therpaist gnetly performing STM to R pec Supine scap with yellow band: narrow and wide grip with pt returing therapist demo x 10 reps each - v/c to keep scapular muscles engaged throughout  PATIENT EDUCATION:  Education details: need for exercise to help  decrease fatigue, neck ROM  exercises Person educated: Patient and Spouse Education method: Explanation Education comprehension: verbalized understanding  HOME EXERCISE PROGRAM: Reviewed previously given post op HEP. Access Code: [email or web address] URL: [email or web address] Date: 01/07/2025 Prepared by: Florina Lanis Carbon  Exercises - Seated Hip Adduction Isometrics with Mercer  - 1 x daily - 7 x weekly - 1-3 sets - 10 reps - 5 sec hold - Seated Hip Abduction with Resistance  - 1 x daily - 7 x weekly - 1-3 sets - 10 reps - 3 sec hold - Seated March  - 1 x daily - 7 x weekly - 3 sets - 10 reps - Seated Long Arc Quad  - 1 x daily - 7 x weekly - 3 sets - 10 reps - 5 sec hold - Sit to Stand Without Arm Support  - 1 x daily - 7 x weekly - 3 sets - 10 reps  ASSESSMENT:  CLINICAL IMPRESSION: Pt is 4 days post new PEG placement and starting to feel some better and stronger. Continued mainly seated, supine exs, but added 1 standing exs today. Pt fatigued but felt good after exercising and didn't feel like we had overdone it. Felt stretched out after scapular retractions and was more aware of posture when seated/standing.  Pt will benefit from skilled therapeutic intervention to improve on the following deficits: decreased knowledge of condition, decreased knowledge of use of DME, decreased mobility, difficulty walking, decreased ROM, decreased strength, postural dysfunction, and pain  PT treatment/interventions: ADL/Self care home management, [phone]- PT Re-evaluation, 97110-Therapeutic exercises, 97530- Therapeutic activity, [id number]- Neuromuscular re-education, 97535- Self Care, [id number]- Manual therapy, [id number]- Orthotic Initial, [id number]- Orthotic/Prosthetic subsequent, and Patient/Family education     GOALS: Goals reviewed with patient? Yes  GOALS MET AT EVAL:   Name Target Date  Goal status  1 Patient will be able to verbalize understanding of a home exercise program for cervical range of  motion, posture, and walking.   Baseline:  No knowledge Eval Achieved at eval  2 Patient will be able to verbalize understanding of proper sitting and standing posture. Baseline:  No knowledge Eval Achieved at eval  3 Patient will be able to verbalize understanding of lymphedema risk and availability of treatment for this condition Baseline:  No knowledge Eval Achieved at eval     LONG TERM GOALS:  (STG=LTG)  GOALS Name Target Date  Goal status  1 Pt will demonstrate a return to baseline cervical ROM measurements and not demonstrate any signs or symptoms of lymphedema. Baseline: 01/01/25 MET  2 Pt will demonstrate 4+/5 bilateral hip flexion strength to decrease fall risk and allow her to get in bed without her husband lifting her legs. 01/29/25 NEW  3 Pt will be able to ambulate independently without CGA for safely and stability. 01/29/25 NEW  4 Pt will be able to perform single limb stance for at least 15 sec on bilateral LEs to decrease fall risk. 01/29/25  NEW  5 Pt will be independent in a home exercise program for continued stretching and strengthening.  01/29/25 NEW       PLAN:  PT FREQUENCY/DURATION: 2x/wk for 4 wks  PLAN FOR NEXT SESSION: LE strengthening, NuStep, seated and supine exercises initially due to increased fatigue, work on improving R scapular strength and R pec stretches, scar massage neck dissection scar   [facility]  [address], [address]  [place] [address]  [phone]   Scar massage You can begin gentle scar massage to you incision sites. Gently place  one hand on the incision and move the skin (without sliding on the skin) in various directions. Do this for a few minutes and then you can gently massage either coconut oil or vitamin E cream into the scars.  Home exercise Program Continue doing the exercises you were given until you feel like you can do them without feeling any tightness at the end. It is best to do them for several  months after completion of radiation since the effects of radiation continue past completion.   Walking Program Studies show that 30 minutes of walking per day (fast enough to elevate your heart rate) can significantly reduce the risk of a cancer recurrence. If you can't walk due to other medical reasons, we encourage you to find another activity you could do (like a stationary bike or water exercise).  Posture After treatment for head and neck cancer, people frequently sit with rounded shoulders and forward head posture because the front of the neck has become tight and it feels better. If you sit like this, you can become very tight and have pain in sitting or standing with good posture. Try to be aware of your posture and sit and stand up tall to heal properly.  Follow up PT: Please let you doctor know as soon as possible if you develop any swelling in your face or neck in the future. Lymphedema (swelling) can occur months after completion of radiation. The sooner you can let the doctor know, the sooner they can refer you back to PT. It is much easier to treat the swelling early on.   Grayce JINNY Sheldon, PT 01/20/2025, 2:56 PM  "

## 2025-01-21 ENCOUNTER — Other Ambulatory Visit: Payer: Self-pay | Admitting: Gastroenterology

## 2025-01-21 DIAGNOSIS — K7460 Unspecified cirrhosis of liver: Secondary | ICD-10-CM

## 2025-01-22 ENCOUNTER — Ambulatory Visit: Admitting: Physical Therapy

## 2025-01-23 ENCOUNTER — Telehealth: Payer: Self-pay | Admitting: Dietician

## 2025-01-23 NOTE — Telephone Encounter (Signed)
 Attempted to contact patient via telephone to assess tolerance/progress of advancing tube feedings. Left VM with request for return call. Contact information provided.

## 2025-01-27 ENCOUNTER — Ambulatory Visit: Admitting: Physical Therapy

## 2025-01-27 ENCOUNTER — Telehealth: Payer: Self-pay | Admitting: Dietician

## 2025-01-27 ENCOUNTER — Inpatient Hospital Stay: Attending: Radiation Oncology | Admitting: Dietician

## 2025-01-27 NOTE — Telephone Encounter (Signed)
 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 She completed 30 fractions of radiation therapy. S/p b/L tonsillectomy 10/3. Last radiation on 12/15    1/21-1/24 admission with FTT s/p PEG on 1/23 DME: [facility]  Spoke with patient via telephone for nutrition follow-up. Patient answered phone and reports doing really well. She is feeling better overall, reports increased energy. Nausea has resolved.   Patient tolerating 4 cartons of Nutren 1.5 via PEG. Has gotten 5 cartons in a few times. She is not eating orally due to taste. Patient is drinking some water by mouth.    Medications: reviewed   Labs: 1/30 outside labs reviewed   Anthropometrics: Last wt 171.4 lb (1/30 PCP per chart) - increased   1/21 - 161 lb    Estimated Energy Needs  Kcals: 2025-2400 Protein: 97-121 Fluid: >2 L  NUTRITION DIAGNOSIS: Food and nutrition related knowledge deficit improved    MALNUTRITION DIAGNOSIS: Severe malnutrition continues    INTERVENTION:  Continue working to increase tube feedings to goal   1 carton Nutren 1.5 via PEG 5x/day. Flush with 60 ml water before and after bolus. Additional water 100 ml TID. Provides 1900 kcal, 85g, 1855 ml total water   MONITORING, EVALUATION, GOAL: wt trends, TF, po   NEXT VISIT:

## 2025-01-29 ENCOUNTER — Encounter: Payer: Self-pay | Admitting: Physical Therapy

## 2025-01-29 ENCOUNTER — Ambulatory Visit: Attending: Radiation Oncology | Admitting: Physical Therapy

## 2025-01-29 DIAGNOSIS — L599 Disorder of the skin and subcutaneous tissue related to radiation, unspecified: Secondary | ICD-10-CM

## 2025-01-29 DIAGNOSIS — M6281 Muscle weakness (generalized): Secondary | ICD-10-CM

## 2025-01-29 DIAGNOSIS — R262 Difficulty in walking, not elsewhere classified: Secondary | ICD-10-CM

## 2025-01-29 DIAGNOSIS — R293 Abnormal posture: Secondary | ICD-10-CM

## 2025-01-29 DIAGNOSIS — C7989 Secondary malignant neoplasm of other specified sites: Secondary | ICD-10-CM

## 2025-01-29 NOTE — Therapy (Signed)
 " OUTPATIENT PHYSICAL THERAPY HEAD AND NECK POST RADIATION FOLLOW UP   Patient Name: Diane Mayo MRN: [id number] DOB:1951/10/25, 74 y.o., female Today's Date: 01/29/2025  END OF SESSION:  PT End of Session - 01/29/25 1451     Visit Number 6    Number of Visits 14    Date for Recertification  02/26/25    PT Start Time 1401    PT Stop Time 1455    PT Time Calculation (min) 54 min    Activity Tolerance Patient tolerated treatment well    Behavior During Therapy WFL for tasks assessed/performed           Past Medical History:  Diagnosis Date   Anemia    Diabetes mellitus without complication (HCC)    Type 2   Family history of adverse reaction to anesthesia    mother slow to wake up   Genital herpes    GERD (gastroesophageal reflux disease)    Hypertension    Interstitial cystitis    Left bundle branch block (LBBB)    chronic   Leukocytosis    NASH (nonalcoholic steatohepatitis)    Osteoarthritis    Plantar fasciitis    Past Surgical History:  Procedure Laterality Date   BACK SURGERY     L3 L4   CARPAL TUNNEL RELEASE Bilateral    CESAREAN SECTION     twice 1978 and 1987   CHOLECYSTECTOMY     DILATION AND CURETTAGE OF UTERUS  1975   DIRECT LARYNGOSCOPY N/A 09/26/2024   Procedure: LARYNGOSCOPY, DIRECT;  Surgeon: Tobie Eldora NOVAK, MD;  Location: [facility] OR;  Service: ENT;  Laterality: N/A;   EYE SURGERY Bilateral 2021   cataracts removed   IR GASTROSTOMY TUBE MOD SED  01/16/2025   RADICAL NECK DISSECTION Right 09/26/2024   Procedure: DISSECTION, NECK, RADICAL;  Surgeon: Tobie Eldora NOVAK, MD;  Location: [facility] OR;  Service: ENT;  Laterality: Right;   SHOULDER SURGERY Right 2012   rotator cuff repair   TONSILLECTOMY Bilateral 09/26/2024   Procedure: TONSILLECTOMY;  Surgeon: Tobie Eldora NOVAK, MD;  Location: 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) 08/27/2024   Hypertriglyceridemia 11/26/2023   Acute respiratory failure with hypoxia (HCC) 02/04/2022   Acute pulmonary edema (HCC)    Sepsis (HCC)    Spinal stenosis of lumbar region 02/01/2022   Essential hypertension 07/08/2019   Candidal vulvovaginitis 01/25/2019   Diabetes mellitus (HCC) 01/25/2019   Menopausal symptom 01/25/2019   Obesity 01/25/2019   LBBB (left bundle branch block) 01/10/2018   Acute cystitis with hematuria 12/10/2014   Burning with urination 11/11/2014   Frequency of urination 08/13/2013   Chronic interstitial cystitis 08/07/2012    PCP: Arnett Repress, MD   REFERRING PROVIDER: Dr. 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 I was not sore after last session.   PERTINENT HISTORY:  SCC metastatic to right cervical lymph node, unknown primary, stage IVA (T0 N2 M0 p 16 +. She presented to her PCP in late January of  2025 with concerns over a mass on her right neck. She underwent a head and neck US  on 03/18/24 showing a 4.5 cm complex cystic mass in or adjacent to the right parotid gland. To further investigate her symptoms she then underwent a neck CT on 04/09/24 showing a  right level 2A there is a predominantly cystic lesion measuring up to 3.2 cm in greatest axial dimension.   07/16/24 She saw Dr. Tobie for a consult regarding her mass. She then underwent a fine needle aspiration biopsy of right parotid with pathology indicating squamous cell carcinoma. Immunohistochemical stain for p16 was performed. The stain show increased staining in the basal layer of the squamous epithelium but overall it is patchy. There is no definitive evidence of block like pattern of staining. The finding does not support a HPV related squamous cell carcinoma... The possibility of squamous cell carcinoma arising from a branchial cleft cyst is considered. However, a metastatic squamous cell carcinoma cannot be excluded. 08/15/24 PET showed a right-sided neck mass demonstrates low level heterogeneous peripheral FDG accumulation with SUV of 4.3 with no other hypermetabolic lymphadenopathy in the neck. No evidence for hypermetabolic metastatic disease in the chest, abdomen, or pelvis. 08/14/24 follow up with Dr. Tobie the patient opted to proceed with bilateral tonsillectomy and radical neck dissection (1/20 nodes) 09/26/24 (delayed due to a grandchild's wedding). She will receive 30 fractions of radiation to her Oropharynx and bilateral neck which started on 10/27/24 and complete 12/08/24. 09/26/24 Bilateral tonsillectomy with biopsies. Hx of back surgery L3/L4  PATIENT GOALS:  Reassess how my recovery is going related to neck ROM, cervical pain, fatigue, and swelling.  PAIN:  Are you having pain? Yes NPRS scale: 2/10 Pain location: throat Pain orientation: Right and Left  PAIN TYPE: sore Pain description: intermittent  Aggravating factors: when  swallowing, breathing hard, or eat Relieving factors: drinking warm liquids  PRECAUTIONS: Recent radiation, Head and neck lymphedema risk,  OBJECTIVE:   POSTURE:  Forward head and rounded shoulders posture  30 SEC SIT TO STAND: 01/01/25: 8 reps in 30 sec without use of UEs which is poor for pt's age At baseline eval on 10/30/24: 10 reps in 30 sec without use of UEs which is  Below average for patient's age   SHOULDER AROM:   [facility]     CERVICAL AROM:     Percent limited baseline on 10/30/24 01/01/25  Flexion WFL WFL  Extension [facility] WFL  Right lateral flexion 25% limited WFL  Left lateral flexion 25% limited 25% limited  Right rotation [facility] WFL  Left rotation WFL WFL                          (Blank rows=not tested)    LYMPHEDEMA ASSESSMENT:    Circumference in cm  4 cm superior to sternal notch around neck 35.2  6 cm superior to sternal notch around neck 36.5  8 cm superior to sternal notch around neck 39.5  R lateral nostril from base of nose to medial tragus   L lateral nostril from base of nose to medial tragus   R corner of mouth to where ear lobe meets face   L corner of mouth to where ear lobe meets face         (Blank rows=not tested)    LOWER EXTREMITY MMT: unable to roll to prone to test hip extension  MMT Right eval RIGHT 01/29/25  Hip flexion 3+/5 3/5  Hip extension    Hip abduction 3/5   Hip adduction    Hip internal rotation    Hip external rotation    Knee flexion 4+/5   Knee extension 5/5   Ankle dorsiflexion 4/5   Ankle plantarflexion    Ankle inversion    Ankle eversion     (Blank rows = not tested)  MMT LEFT eval LEFT 01/29/25  Hip flexion 3+/5 3+/5  Hip extension    Hip abduction 3/5   Hip adduction    Hip internal rotation    Hip external rotation    Knee flexion 4+/5   Knee extension 5/5   Ankle dorsiflexion 4/5   Ankle plantarflexion    Ankle inversion    Ankle eversion     (Blank rows = not tested)  SINGLE LIMB STANCE: R 3  sec, L 1 sec 01/29/25- R 2 sec, L 2 sec  CURRENT/PAST TREATMENTS:  Surgery type/date: 09/26/24- R neck dissection  Chemotherapy: not required   Radiation:completed 12/08/24  OTHER SYMPTOMS: Pain Yes Fibrosis No Pitting edema No Infections No Decreased scar mobility Yes  TREATMENT PERFORMED: 01/29/2025 Sit to stand 1x 10 Seated scapular retraction x 10 with red theraband Marching 1 set of 10 reps with 1 lb ankle weights LAQ 1 set of 10 reps with 1 lb ankle weights with 5 sec holds Nustep level 3 seat at 6, UEs at 7, x 7 min 30 sec 295 steps Supine bridging with v/c to engage glutes and core throughout x 2 sets of 5 Ball squeeze 2 x 5, 5 sec holds Supine shoulder alphabet B x 1 Supine ER with yellow band x 10 Supine horizontal abd yellow band x 10 Supine diagonals with yellow band with t/c for correct form x 7 each - stopped due to fatigue Hip abduction seated x 8 red band  01/20/2025 Nustep level 1 seat at 5, UEs at 7, x  5 min to warm up  Sit to stand 2 x 5 Seated scapular retraction x 10 Marching 2 x 5 reps LAQ 2 x 5 ea Ball squeeze 2 x 5, 5 sec holds Hip abduction seated x 10 yellow band Standing foot placement on 6 in step x 5 B no HH Supine snow angels x 10, 5 sec hold Supine shoulder alphabet B x 1 Supine ER with yellow band x 10 Supine march x 5 B Supine horizontal abd yellow band x 10 01/13/2025 Discussed progress and pt/husband concerns Messaged Dr. Izell for pt. Nustep level 1 seat at 5, UEs at 5, x 10 min to warm up Marching 1 #, x 10, x 5 reps reps with pt returning therapist demo LAQ with 5 sec holds  1# 2 x 10 reps, Ball squeezes with 5 sec holds x 10 reps, x 5 reps Sit to stands x 3 x 5 reps without hands  Hip abduction with yellow band x 10 reps Educated in sit to and from supine on mat table Supine scapular series with yellow, narrow and wide grip flexion x 10 ea Supine Horizontal abd x 10 ea yellow Snow angels x 5 ea 01/07/25: Nustep level 1 seat  at 5, UEs at 5, x 10 min to warm up Seated edge of mat: Marching x 10 reps with pt returning therapist demo LAQ with 5 sec holds x 10 reps Ball squeezes with 5 sec holds x 10 reps Hip abduction with yellow medium loop band x 10 reps Sit to stands x 10 reps with v/c for initial foot placement Supine on large mat table: Gentle over pressure to R anterior shoulder for pec stretch Lying arm outstretched on mat table at about 115 degrees of abduction for pec stretch with therpaist gnetly performing STM to R pec Supine scap with yellow band: narrow and wide grip with pt returing therapist demo x 10 reps each - v/c to keep scapular muscles engaged throughout  PATIENT EDUCATION:  Education details: need for exercise to help decrease fatigue, neck ROM exercises Person educated: Patient and Spouse Education method: Explanation Education comprehension: verbalized understanding  HOME EXERCISE PROGRAM: Reviewed previously given post op HEP. Access Code: [email or web address] URL: [email or web address] Date: 01/07/2025 Prepared by: Florina Lanis Carbon  Exercises - Seated Hip Adduction Isometrics with Mercer  - 1 x daily - 7 x weekly - 1-3 sets - 10 reps - 5 sec hold - Seated Hip Abduction with Resistance  - 1 x daily - 7 x weekly - 1-3 sets - 10 reps - 3 sec hold - Seated March  - 1 x daily - 7 x weekly - 3 sets - 10 reps - Seated Long Arc Quad  - 1 x daily - 7 x weekly - 3 sets - 10 reps - 5 sec hold - Sit to Stand Without Arm Support  - 1 x daily - 7 x weekly - 3 sets - 10 reps  ASSESSMENT:  CLINICAL IMPRESSION: Pt still feeling very fatigued. Progressed exercises today by increasing number of reps per set. Added 1 lb ankle weights to seated exercises. Increased resistance on NuStep from level 1 to level 3. Encouraged pt to be compliant with home exercise program to help build strength and decrease fatigue. Pt would benefit from skilled PT services to continue to improve bilateral hip  strength, balance and functional mobility.   Pt will benefit from skilled therapeutic intervention to improve on the following deficits: decreased knowledge of condition, decreased knowledge of use of DME, decreased mobility, difficulty walking,  decreased ROM, decreased strength, postural dysfunction, and pain  PT treatment/interventions: ADL/Self care home management, [phone]- PT Re-evaluation, 97110-Therapeutic exercises, 97530- Therapeutic activity, [id number]- Neuromuscular re-education, 97535- Self Care, [id number]- Manual therapy, 97760- Orthotic Initial, [phone]- Orthotic/Prosthetic subsequent, and Patient/Family education     GOALS: Goals reviewed with patient? Yes  GOALS MET AT EVAL:   Name Target Date  Goal status  1 Patient will be able to verbalize understanding of a home exercise program for cervical range of motion, posture, and walking.   Baseline:  No knowledge Eval Achieved at eval  2 Patient will be able to verbalize understanding of proper sitting and standing posture. Baseline:  No knowledge Eval Achieved at eval  3 Patient will be able to verbalize understanding of lymphedema risk and availability of treatment for this condition Baseline:  No knowledge Eval Achieved at eval     LONG TERM GOALS:  (STG=LTG)  GOALS Name Target Date  Goal status  1 Pt will demonstrate a return to baseline cervical ROM measurements and not demonstrate any signs or symptoms of lymphedema. Baseline: 01/01/25 MET  2 Pt will demonstrate 4+/5 bilateral hip flexion strength to decrease fall risk and allow her to get in bed without her husband lifting her legs. 01/29/25 ONGOING 01/29/25 - 3/5 on R and 3+/5 on L  3 Pt will be able to ambulate independently without CGA for safely and stability. 01/29/25 ONGOING 01/29/25 - usually is ambulating independently but uses cane occasionally  4 Pt will be able to perform single limb stance for at least 15 sec on bilateral LEs to decrease fall risk. 01/29/25 IN PROGRESS 01/29/25-  2 sec bilaterally  5 Pt will be independent in a home exercise program for continued stretching and strengthening.  01/29/25  ONGOING 01/29/25       PLAN:  PT FREQUENCY/DURATION: 2x/wk for 4 wks  PLAN FOR NEXT SESSION: LE strengthening, NuStep, seated and supine exercises initially due to increased fatigue, work on improving R scapular strength and R pec stretches, scar massage neck dissection scar   [facility]  [address], [address]  [place] [address]  [phone]   Scar massage You can begin gentle scar massage to you incision sites. Gently place one hand on the incision and move the skin (without sliding on the skin) in various directions. Do this for a few minutes and then you can gently massage either coconut oil or vitamin E cream into the scars.  Home exercise Program Continue doing the exercises you were given until you feel like you can do them without feeling any tightness at the end. It is best to do them for several months after completion of radiation since the effects of radiation continue past completion.   Walking Program Studies show that 30 minutes of walking per day (fast enough to elevate your heart rate) can significantly reduce the risk of a cancer recurrence. If you can't walk due to other medical reasons, we encourage you to find another activity you could do (like a stationary bike or water exercise).  Posture After treatment for head and neck cancer, people frequently sit with rounded shoulders and forward head posture because the front of the neck has become tight and it feels better. If you sit like this, you can become very tight and have pain in sitting or standing with good posture. Try to be aware of your posture and sit and stand up tall to heal properly.  Follow up PT: Please let you  doctor know as soon as possible if you develop any swelling in your face or neck in the future. Lymphedema (swelling) can occur months after  completion of radiation. The sooner you can let the doctor know, the sooner they can refer you back to PT. It is much easier to treat the swelling early on.   [facility], PT 01/29/2025, 3:01 PM  "

## 2025-02-03 ENCOUNTER — Inpatient Hospital Stay: Admitting: Dietician

## 2025-02-03 ENCOUNTER — Other Ambulatory Visit

## 2025-02-04 ENCOUNTER — Ambulatory Visit: Attending: Radiation Oncology

## 2025-02-10 ENCOUNTER — Ambulatory Visit

## 2025-02-12 ENCOUNTER — Ambulatory Visit

## 2025-02-17 ENCOUNTER — Ambulatory Visit

## 2025-02-19 ENCOUNTER — Ambulatory Visit

## 2025-02-24 ENCOUNTER — Ambulatory Visit

## 2025-02-25 ENCOUNTER — Ambulatory Visit: Admitting: Physical Therapy

## 2025-03-06 ENCOUNTER — Ambulatory Visit (HOSPITAL_COMMUNITY)

## 2025-03-10 ENCOUNTER — Ambulatory Visit: Admitting: Radiation Oncology

## 2025-04-09 ENCOUNTER — Ambulatory Visit (INDEPENDENT_AMBULATORY_CARE_PROVIDER_SITE_OTHER): Admitting: Otolaryngology
